# Patient Record
Sex: Female | Born: 1960
Health system: Southern US, Community
[De-identification: ages and names within clinical notes are randomized; demographics above are authoritative.]

## PROBLEM LIST (undated history)

## (undated) DIAGNOSIS — E669 Obesity, unspecified: Secondary | ICD-10-CM

## (undated) DIAGNOSIS — F319 Bipolar disorder, unspecified: Secondary | ICD-10-CM

## (undated) DIAGNOSIS — F259 Schizoaffective disorder, unspecified: Secondary | ICD-10-CM

## (undated) DIAGNOSIS — R569 Unspecified convulsions: Secondary | ICD-10-CM

## (undated) DIAGNOSIS — M1990 Unspecified osteoarthritis, unspecified site: Secondary | ICD-10-CM

## (undated) DIAGNOSIS — F1910 Other psychoactive substance abuse, uncomplicated: Secondary | ICD-10-CM

## (undated) DIAGNOSIS — F250 Schizoaffective disorder, bipolar type: Secondary | ICD-10-CM

## (undated) DIAGNOSIS — I10 Essential (primary) hypertension: Secondary | ICD-10-CM

## (undated) DIAGNOSIS — F99 Mental disorder, not otherwise specified: Secondary | ICD-10-CM

## (undated) HISTORY — PX: STOMACH SURGERY: SHX791

---

## 2004-05-17 ENCOUNTER — Emergency Department (HOSPITAL_COMMUNITY): Admission: EM | Admit: 2004-05-17 | Discharge: 2004-05-17 | Payer: Self-pay | Admitting: Emergency Medicine

## 2006-12-05 ENCOUNTER — Emergency Department (HOSPITAL_COMMUNITY): Admission: EM | Admit: 2006-12-05 | Discharge: 2006-12-06 | Payer: Self-pay | Admitting: Emergency Medicine

## 2007-01-25 ENCOUNTER — Emergency Department (HOSPITAL_COMMUNITY): Admission: EM | Admit: 2007-01-25 | Discharge: 2007-01-26 | Payer: Self-pay | Admitting: Emergency Medicine

## 2008-01-23 ENCOUNTER — Ambulatory Visit: Payer: Self-pay | Admitting: Internal Medicine

## 2008-01-23 ENCOUNTER — Encounter: Payer: Self-pay | Admitting: Emergency Medicine

## 2008-01-23 ENCOUNTER — Ambulatory Visit: Payer: Self-pay | Admitting: Obstetrics & Gynecology

## 2008-01-23 ENCOUNTER — Inpatient Hospital Stay (HOSPITAL_COMMUNITY): Admission: AD | Admit: 2008-01-23 | Discharge: 2008-02-11 | Payer: Self-pay | Admitting: Obstetrics and Gynecology

## 2008-01-29 ENCOUNTER — Ambulatory Visit: Payer: Self-pay | Admitting: Infectious Disease

## 2008-01-29 ENCOUNTER — Encounter: Payer: Self-pay | Admitting: Obstetrics & Gynecology

## 2008-04-02 ENCOUNTER — Inpatient Hospital Stay (HOSPITAL_COMMUNITY): Admission: AD | Admit: 2008-04-02 | Discharge: 2008-04-02 | Payer: Self-pay | Admitting: Obstetrics & Gynecology

## 2008-04-10 ENCOUNTER — Ambulatory Visit: Payer: Self-pay | Admitting: Family Medicine

## 2008-09-14 ENCOUNTER — Emergency Department (HOSPITAL_COMMUNITY): Admission: EM | Admit: 2008-09-14 | Discharge: 2008-09-14 | Payer: Self-pay | Admitting: Emergency Medicine

## 2009-10-12 IMAGING — CR DG CHEST 1V
1 series · 1 of 1 positions shown · non-contrast
Comparison: 01/27/2008

CLINICAL DATA: Abdominal pain, pneumonia

CHEST - 1 VIEW

[view not recorded]
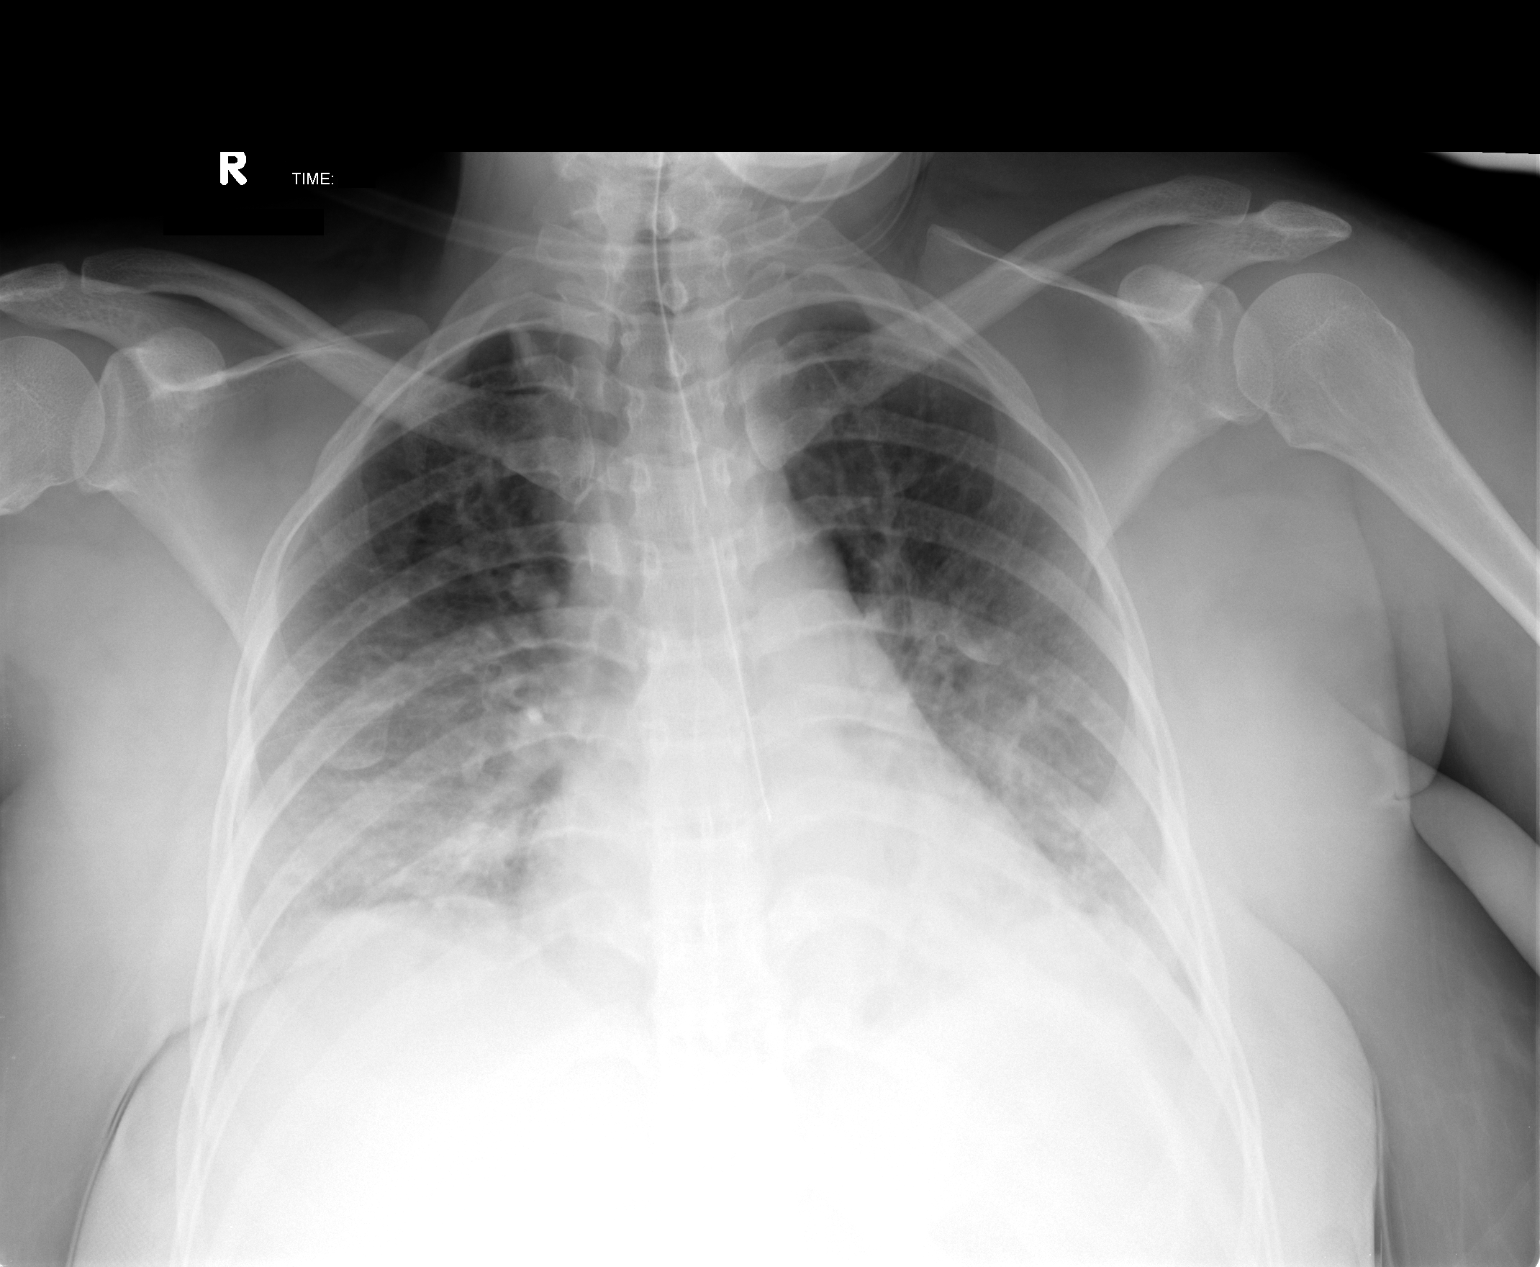

[1 of 1 positions shown; findings below may reference images not displayed]

FINDINGS: Nasogastric tube tip terminates over the distal one third
of the esophagus.  Heart size at the upper limits of normal.  Ill-
defined bibasilar pulmonary parenchymal opacities persist.
Aeration decreased since the prior study.  Small right-sided
pleural effusion noted.
IMPRESSION: Persistent bibasilar airspace opacities, which may represent a
combination of pneumonia or atelectasis. If the patient's symptoms
continue, consider PA and lateral chest radiographs obtained at
full inspiration when the patient is clinically able.

Nasogastric tube tip over distal one third of the esophagus, which
should be repositioned.

## 2009-10-12 IMAGING — CR DG ABDOMEN 2V
2 series · 2 of 2 positions shown · non-contrast
Comparison: January 26, 2008

CLINICAL DATA: Worsening right lower quadrant pain and abdominal
distention

ABDOMEN - 2 VIEW

[view not recorded (1 of 2)]
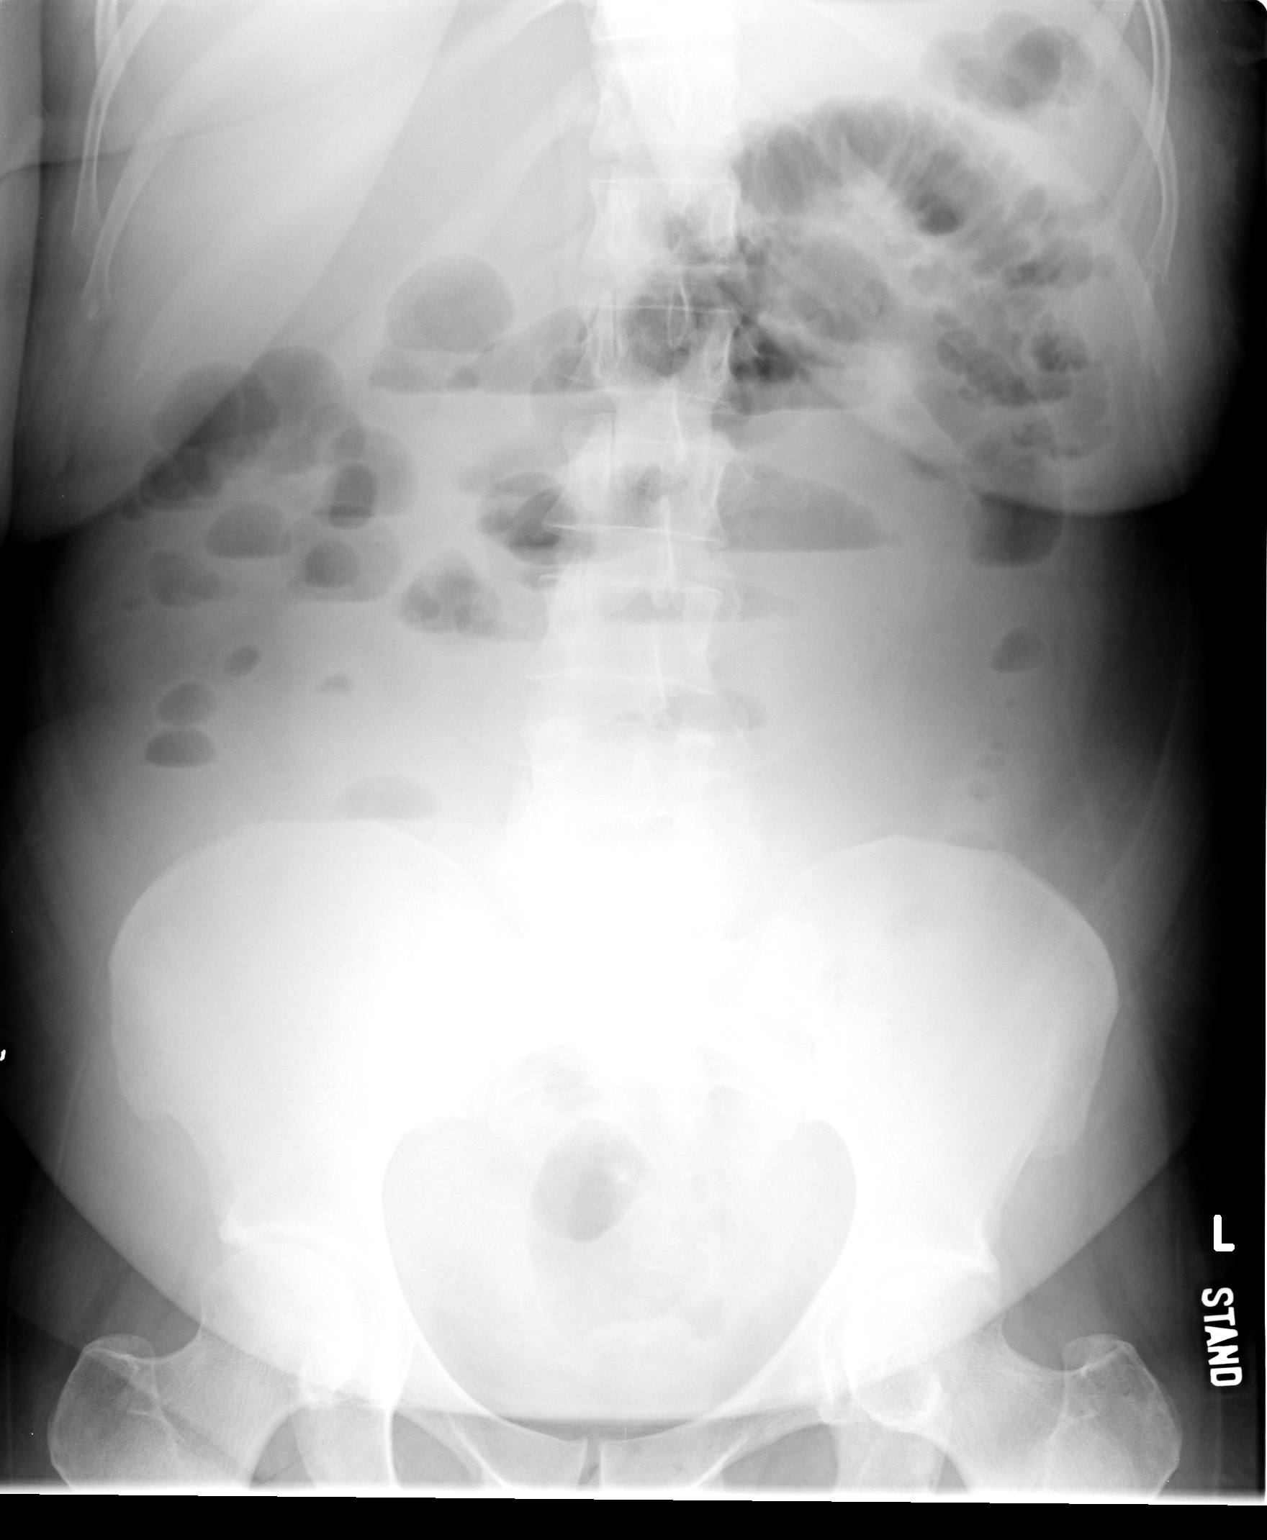

[view not recorded (2 of 2)]
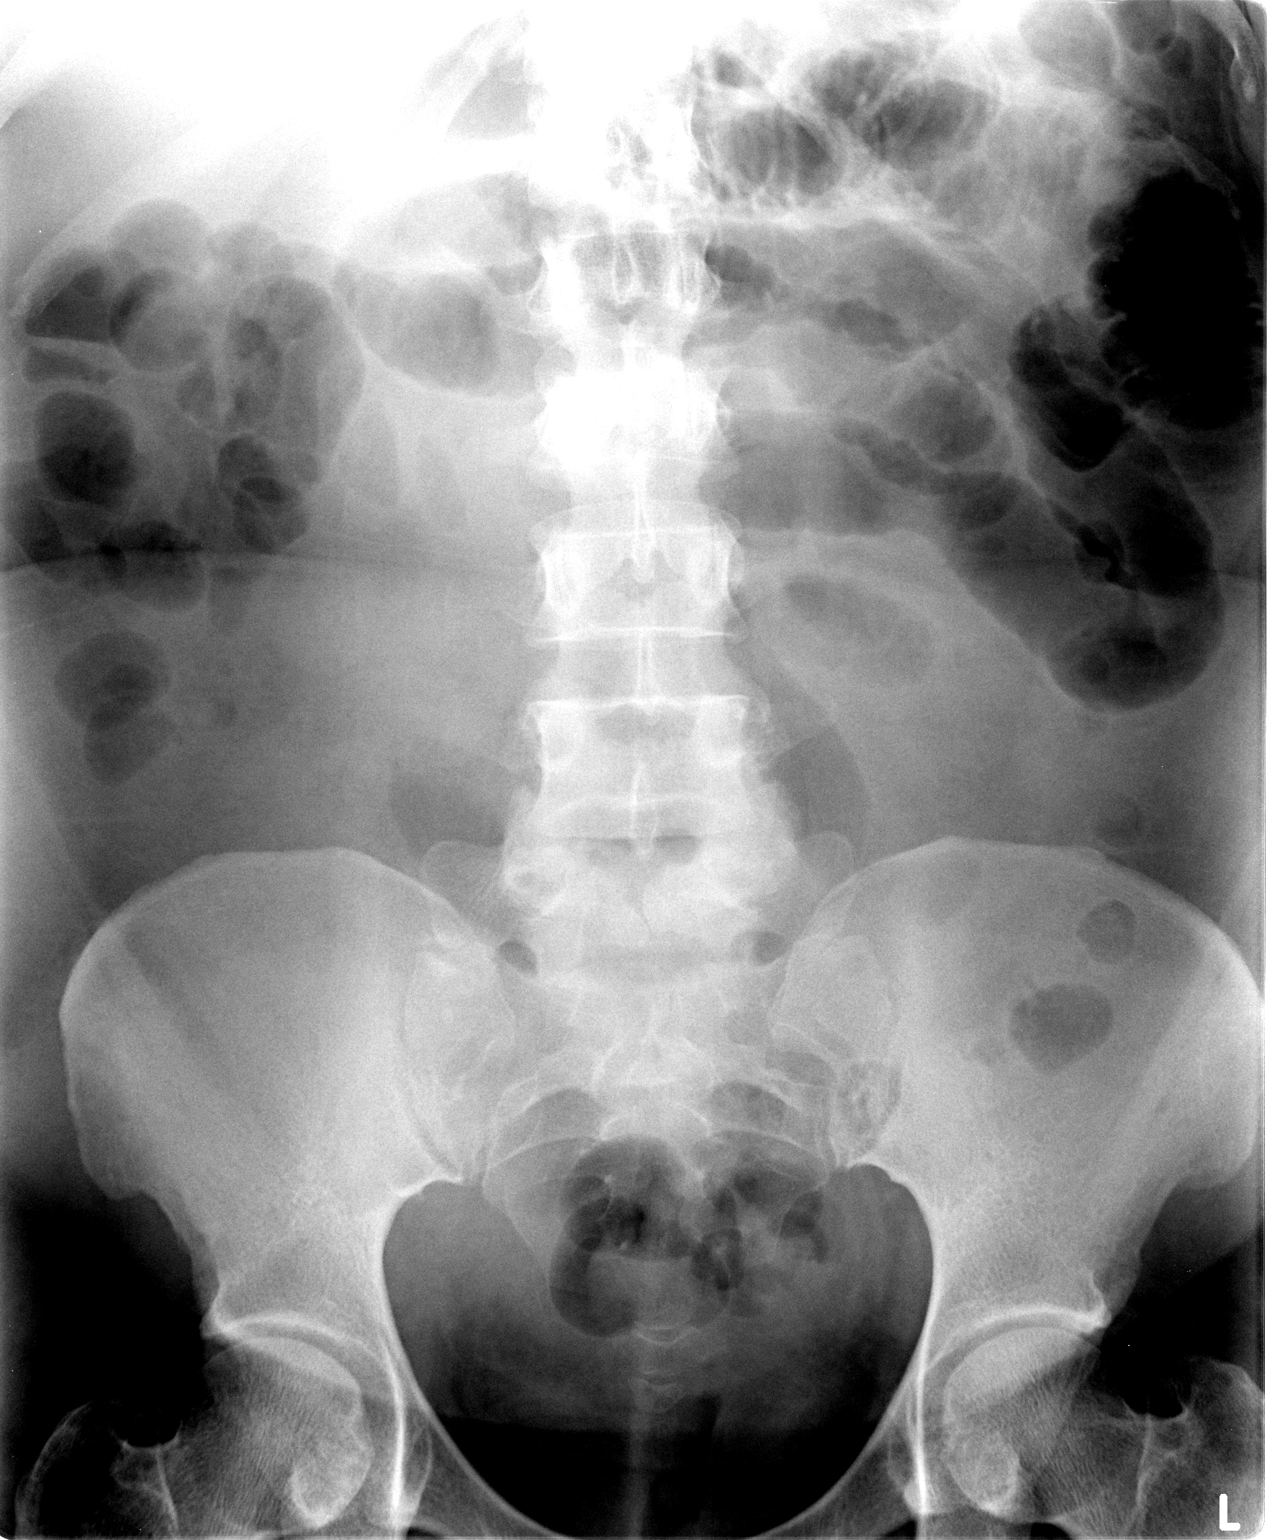

[2 of 2 positions shown; findings below may reference images not displayed]

FINDINGS: Small bowel distention and air-fluid levels are now
present, worrisome for ileus or low grade obstruction from the
inflammatory process seen on the January 25, 2008 CT scan.  No
pneumoperitoneum is identified.
IMPRESSION: Small bowel dilatation is now present, consistent with ileus or low
grade obstruction from right lower quadrant inflammatory process
seen on recent CT scan.  The case was discussed with the resident
physician.

## 2009-10-17 IMAGING — CR DG CHEST 2V
2 series · 2 of 2 positions shown · non-contrast
Comparison: 01/31/2008

CLINICAL DATA: Pneumonia, behavioral disorder, post exploratory
laparotomy

CHEST - 2 VIEW

[view not recorded (1 of 2)]
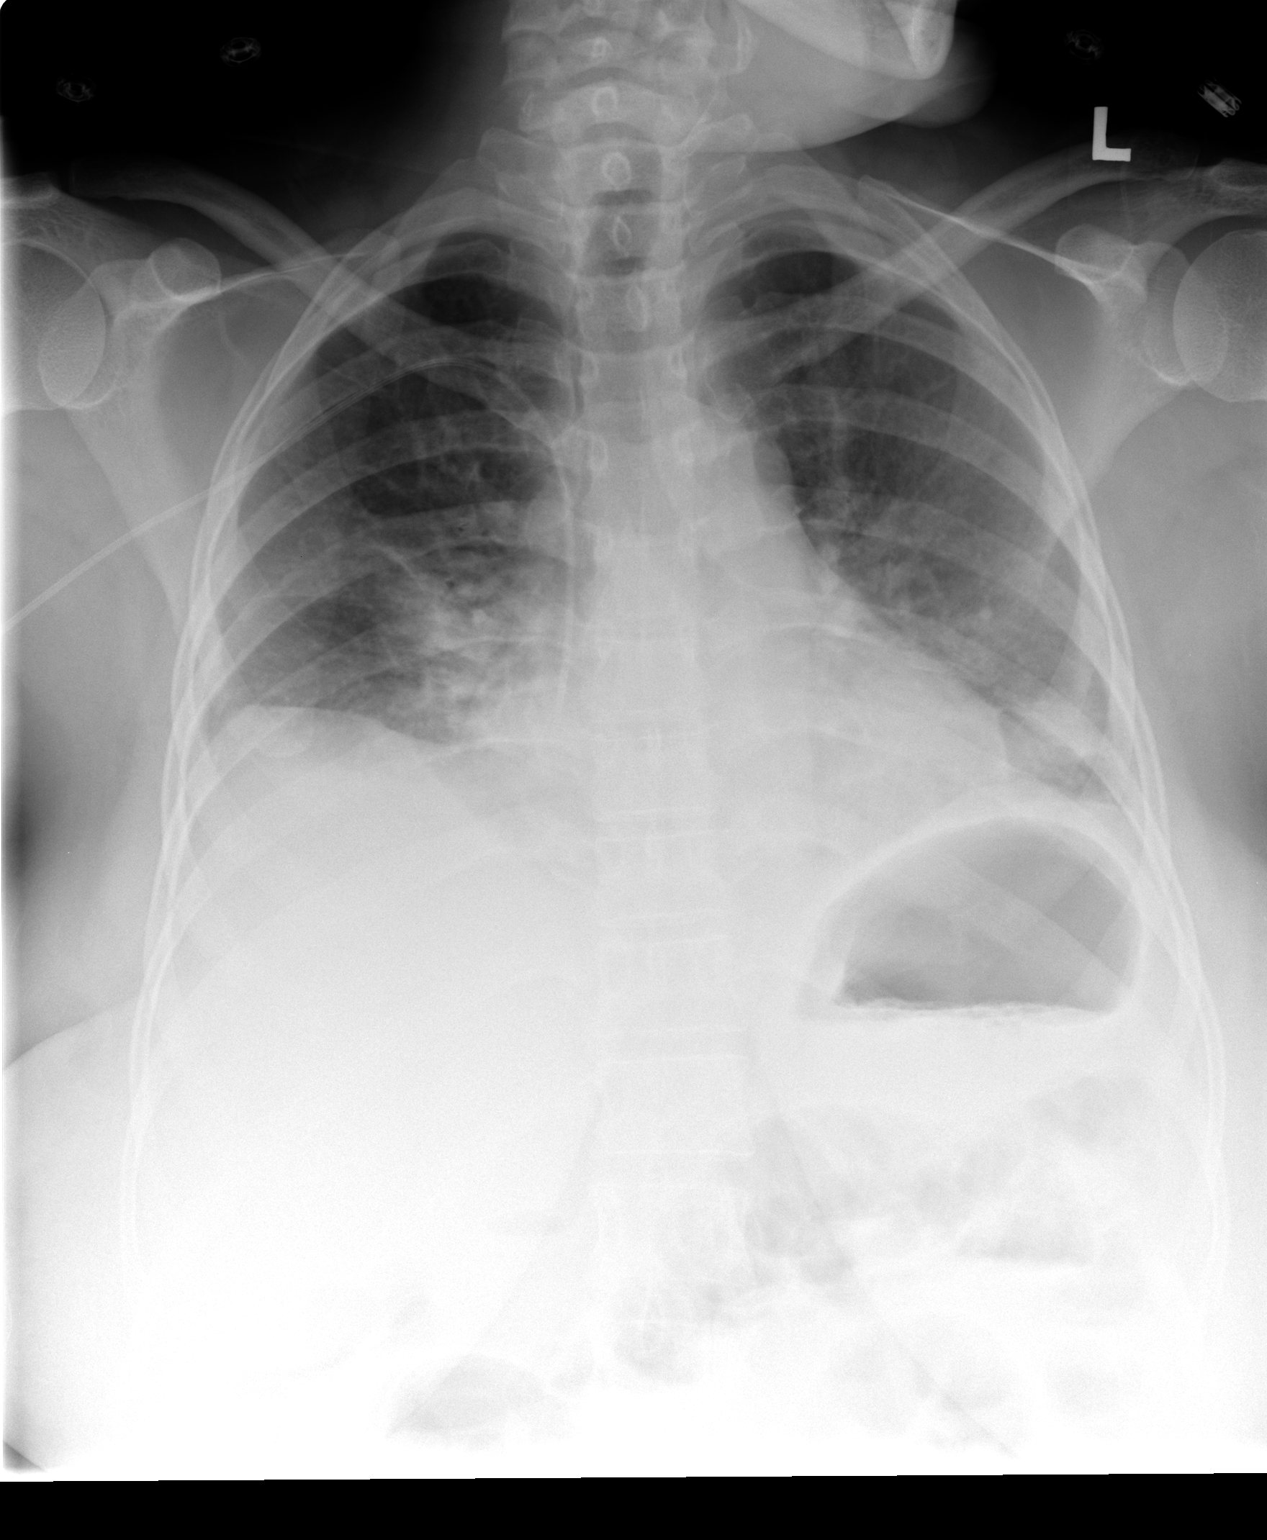

[view not recorded (2 of 2)]
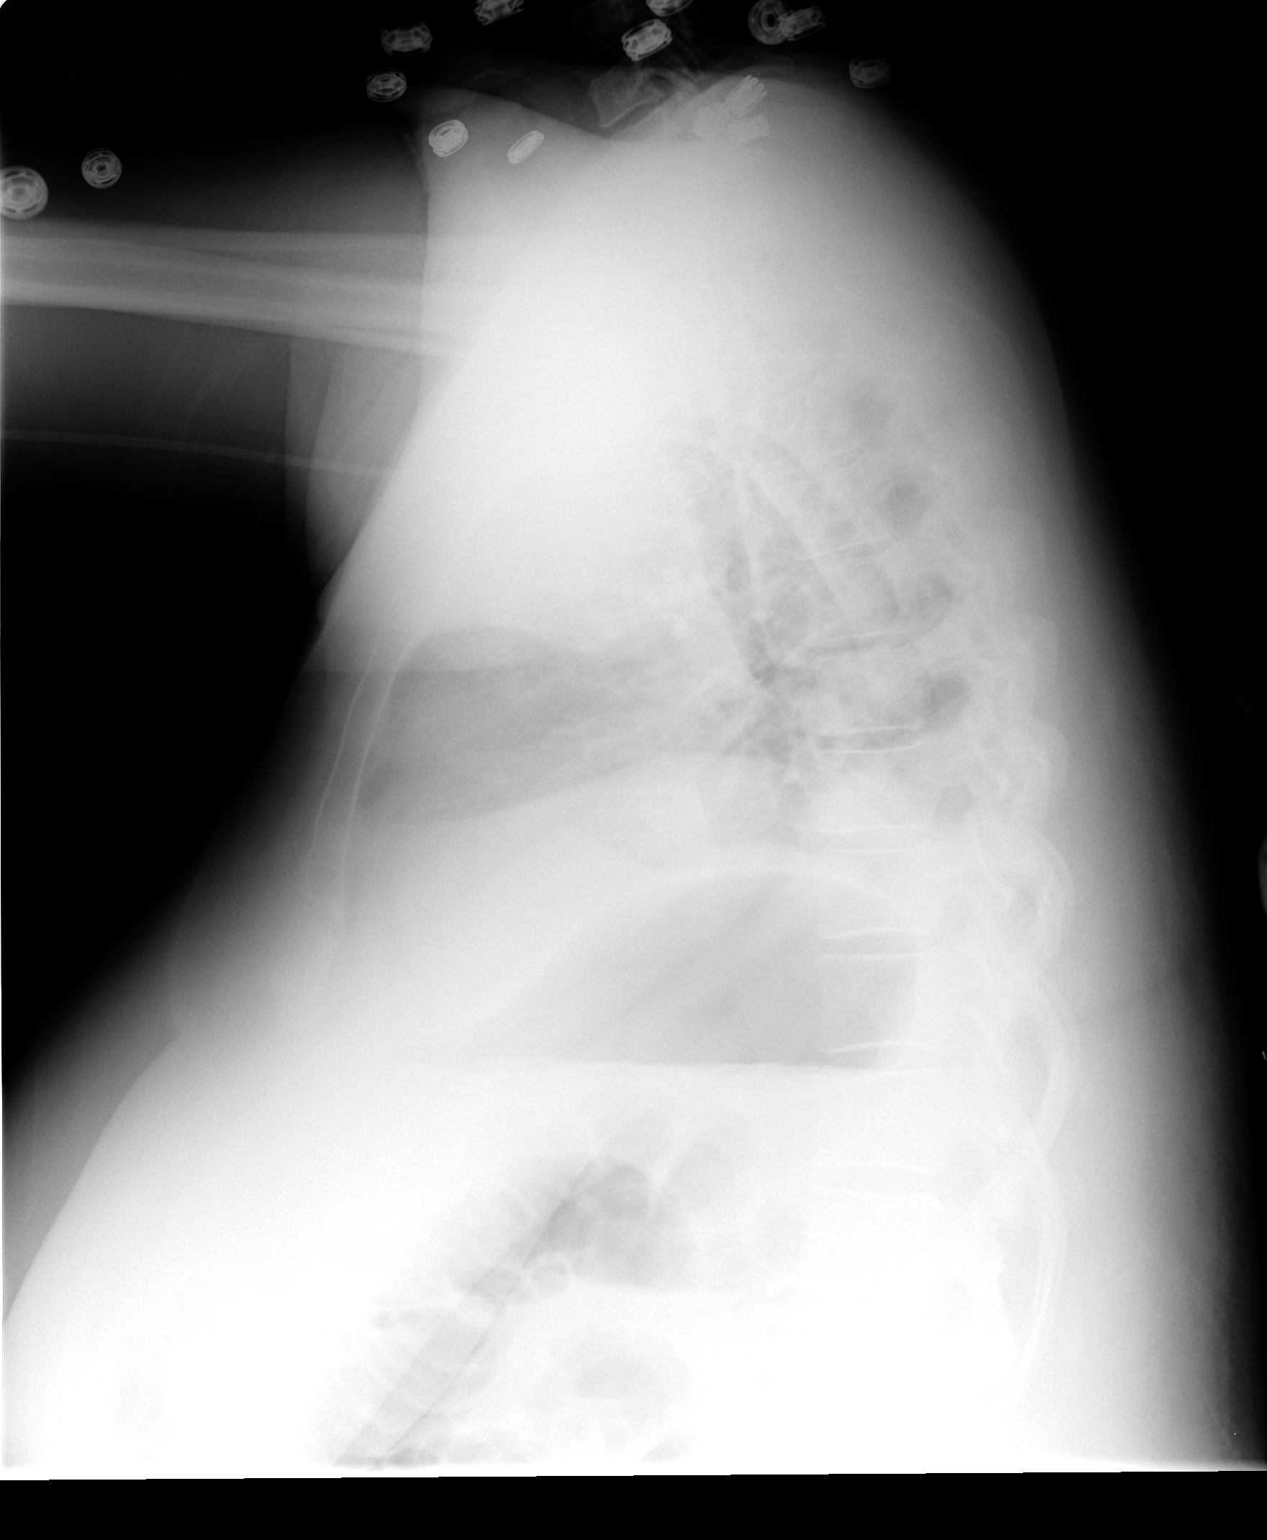

[2 of 2 positions shown; findings below may reference images not displayed]

FINDINGS: Accentuated heart size and bibasilar atelectasis due to low
respiratory lung volumes.
Cannot exclude early right perihilar infiltrate.
Tip of right arm PICC line at cavoatrial junction.
Upper lungs clear.
Crowding of perihilar markings.
Bones unremarkable.
IMPRESSION: Bibasilar atelectasis, cannot exclude early right perihilar
infiltrate.

## 2010-06-20 LAB — RAPID URINE DRUG SCREEN, HOSP PERFORMED
Amphetamines: NOT DETECTED
Cocaine: POSITIVE — AB
Opiates: NOT DETECTED
Tetrahydrocannabinol: NOT DETECTED

## 2010-06-20 LAB — CBC
HCT: 40.4 % (ref 36.0–46.0)
MCHC: 34 g/dL (ref 30.0–36.0)
MCV: 90.1 fL (ref 78.0–100.0)
Platelets: 257 10*3/uL (ref 150–400)
RDW: 14.1 % (ref 11.5–15.5)
WBC: 4.7 10*3/uL (ref 4.0–10.5)

## 2010-06-20 LAB — POCT CARDIAC MARKERS
Myoglobin, poc: 46.8 ng/mL (ref 12–200)
Myoglobin, poc: 71.8 ng/mL (ref 12–200)

## 2010-06-20 LAB — DIFFERENTIAL
Eosinophils Absolute: 0.1 10*3/uL (ref 0.0–0.7)
Eosinophils Relative: 3 % (ref 0–5)
Lymphs Abs: 1.5 10*3/uL (ref 0.7–4.0)

## 2010-06-20 LAB — POCT I-STAT, CHEM 8
Creatinine, Ser: 1 mg/dL (ref 0.4–1.2)
Hemoglobin: 14.6 g/dL (ref 12.0–15.0)
Sodium: 140 mEq/L (ref 135–145)
TCO2: 28 mmol/L (ref 0–100)

## 2010-06-21 ENCOUNTER — Encounter: Payer: Self-pay | Admitting: Internal Medicine

## 2010-06-21 ENCOUNTER — Emergency Department (HOSPITAL_COMMUNITY): Payer: Medicare Other

## 2010-06-21 ENCOUNTER — Observation Stay (HOSPITAL_COMMUNITY)
Admission: EM | Admit: 2010-06-21 | Discharge: 2010-06-23 | Disposition: A | Discharge status: Home / Self Care | Payer: Medicare Other | Attending: Infectious Diseases | Admitting: Infectious Diseases

## 2010-06-21 DIAGNOSIS — E669 Obesity, unspecified: Secondary | ICD-10-CM | POA: Insufficient documentation

## 2010-06-21 DIAGNOSIS — F1910 Other psychoactive substance abuse, uncomplicated: Secondary | ICD-10-CM | POA: Diagnosis present

## 2010-06-21 DIAGNOSIS — F209 Schizophrenia, unspecified: Secondary | ICD-10-CM | POA: Diagnosis present

## 2010-06-21 DIAGNOSIS — R079 Chest pain, unspecified: Principal | ICD-10-CM | POA: Insufficient documentation

## 2010-06-21 DIAGNOSIS — F319 Bipolar disorder, unspecified: Secondary | ICD-10-CM | POA: Insufficient documentation

## 2010-06-21 DIAGNOSIS — F17200 Nicotine dependence, unspecified, uncomplicated: Secondary | ICD-10-CM | POA: Insufficient documentation

## 2010-06-21 DIAGNOSIS — F1010 Alcohol abuse, uncomplicated: Secondary | ICD-10-CM | POA: Insufficient documentation

## 2010-06-21 DIAGNOSIS — F1410 Cocaine abuse, uncomplicated: Secondary | ICD-10-CM | POA: Insufficient documentation

## 2010-06-21 LAB — COMPREHENSIVE METABOLIC PANEL
AST: 24 U/L (ref 0–37)
Alkaline Phosphatase: 50 U/L (ref 39–117)
BUN: 13 mg/dL (ref 6–23)
CO2: 25 mEq/L (ref 19–32)
Chloride: 104 mEq/L (ref 96–112)
Creatinine, Ser: 0.8 mg/dL (ref 0.4–1.2)
GFR calc Af Amer: >60 mL/min (ref >60)
GFR calc non Af Amer: >60 mL/min (ref >60)
Total Bilirubin: 0.6 mg/dL (ref 0.3–1.2)

## 2010-06-21 LAB — CBC
MCH: 29.5 pg (ref 26.0–34.0)
MCHC: 33.6 g/dL (ref 30.0–36.0)
Platelets: 227 10*3/uL (ref 150–400)

## 2010-06-21 LAB — POCT CARDIAC MARKERS
CKMB, poc: <1 ng/mL — ABNORMAL LOW (ref 1.0–8.0)
CKMB, poc: <1 ng/mL — ABNORMAL LOW (ref 1.0–8.0)
Myoglobin, poc: 21 ng/mL (ref 12–200)
Myoglobin, poc: 26.9 ng/mL (ref 12–200)
Troponin i, poc: <0.05 ng/mL (ref 0.00–0.09)
Troponin i, poc: <0.05 ng/mL (ref 0.00–0.09)

## 2010-06-21 LAB — RAPID URINE DRUG SCREEN, HOSP PERFORMED
Amphetamines: NOT DETECTED
Barbiturates: NOT DETECTED
Benzodiazepines: NOT DETECTED
Cocaine: POSITIVE — AB
Opiates: NOT DETECTED

## 2010-06-21 LAB — ETHANOL: Alcohol, Ethyl (B): <5 mg/dL (ref 0–10)

## 2010-06-21 LAB — PROTIME-INR
INR: 0.97 (ref 0.00–1.49)
Prothrombin Time: 13.1 s (ref 11.6–15.2)

## 2010-06-21 LAB — URINALYSIS, ROUTINE W REFLEX MICROSCOPIC
Glucose, UA: NEGATIVE mg/dL
Hgb urine dipstick: NEGATIVE
Specific Gravity, Urine: 1.013 (ref 1.005–1.030)

## 2010-06-21 LAB — MAGNESIUM: Magnesium: 2.2 mg/dL (ref 1.5–2.5)

## 2010-06-21 LAB — TSH: TSH: 0.893 u[IU]/mL (ref 0.350–4.500)

## 2010-06-21 LAB — CK TOTAL AND CKMB (NOT AT ARMC)
CK, MB: 3.2 ng/mL (ref 0.3–4.0)
Relative Index: INVALID (ref 0.0–2.5)
Total CK: 94 U/L (ref 7–177)

## 2010-06-21 LAB — TROPONIN I: Troponin I: 0.05 ng/mL (ref 0.00–0.06)

## 2010-06-21 LAB — APTT: aPTT: 26 s (ref 24–37)

## 2010-06-21 NOTE — H&P (Signed)
Hospital Admission Note Date: 06/21/2010  Patient name: [name] Medical record number: [id number] Date of birth: November 29, 1960 Age: 50 y.o. Gender: female PCP: No primary provider on file.  Medical Service: Internal Medicine Teaching Service Team B-1  Attending physician:  Dr. Darlina Sicilian Resident [phone]): Dr. Bethel Born  Pager: [phone] Resident (R1): Dr. Bard Herbert   Pager: [phone] Medical Student (MSIII): Alanson Puls Pager: [phone]  Chief Complaint: Chest pain  History of Present Illness: Patient is a 50 year old woman with a PMH significant for schizophrenia, depression, cocaine, and alcohol abuse who presents to the [facility] ER with a history of chest pain since Saturday 06/19/10.  She states that over the last few weeks she have been using crack cocaine daily and drinking excessive amounts, up to 4-5 40 oz beer.  Saturday she was walking back to her boarding home from the bus stop and started to feel a sharp, stabbing 10/10 chest pain.  The pain was in the center of her chest and did not radiate.  She stopped to rest and it got better and then when she started walking again it started to get worse.  It was associated with shortness of breath but she denies diaphoresis, dizziness, syncope, or abdominal pain.  Since then she has had several repeat bouts of this pain and decided to come to the ER today when she continued to have the intermittent pain and also because she wants to quit cocaine and alcohol.  She is a smoker and has also used crack cocaine since she was 50 years old with several stays in different rehabilitation facilities in the past.  She has a history of schizophrenia and states that she has been taking her medications as directed.  She uses [facility] [place] to help manage her medications.  She denies SI, HI, racing thoughts, visual hallucinations, auditory hallucinations, or tactile hallucinations but does state that she hasn't slept much in the last few  weeks.  Medications: 1. Depakote 1000 mg BID PO 2. Seroquel 50 mg BID PO 3. Risperidone 4 mg PO daily  Allergies: NKDA  PMH:  1. Schizophrenia 2. Depression 3. Alcohol abuse 4. Cocaine abuse  Past Surg Hx: 1. Exploratory Laparotomy for tubal abscess 2. Appendectomy 3. Right salpingo-oophorectomy 4. Omentectomy  Family History:  Social History  . Marital Status: Divorced    Spouse Name: N/A    Number of Children: 2  . Years of Education: Not assessed   Occupational History  . None   Social History Main Topics  . Smoking status: Every day smoker for at least 20 years  . Smokeless tobacco: None  . Alcohol Use: 4-5 40 oz beers daily  . Drug Use: Crack cocaine and "pain pills"  . Sexually Active: Has been trading Sex for crack cocaine    Social History Narrative  . States she has been living in boarding houses for many years.    Review of Systems: Negative except for as noted in the HPI.  Vital Signs: Tm: 98.1  Pulse: 82  BP: 145/95  Resp: 14  Sats: 100% on RA Physical Exam: Constitutional: Vital signs reviewed.  Patient is an obese, well-developed female in no acute distress and cooperative with exam. Alert and oriented x3.  Head: Normocephalic and atraumatic Ear: TM normal bilaterally Mouth: no erythema or exudates, MMM, occasional abnormal movements of her mouth. Eyes: PERRL, EOMI, conjunctivae normal, No scleral icterus.  Neck: Supple, Trachea midline normal ROM, No JVD, mass, thyromegaly, or carotid bruit  present.  Cardiovascular: RRR, S1 normal, S2 normal, no MRG, pulses symmetric and intact bilaterally Pulmonary/Chest: CTAB, no wheezes, rales, or rhonchi, decreased air movement secondary to shallow respirations Abdominal: Soft. Non-tender, non-distended, bowel sounds are normal, no masses, organomegaly, or guarding present.  GU: no CVA tenderness Musculoskeletal: No joint deformities, erythema, or stiffness, ROM full and no nontender Hematology: no  cervical, inginal, or axillary adenopathy.  Neurological: A&O x3, Strength is normal and symmetric bilaterally, cranial nerve II-XII are grossly intact, no focal motor deficit, sensory intact to light touch bilaterally.  Skin: Warm, dry and intact. No rash, cyanosis, or clubbing.  Psychiatric: Normal mood and affect. speech is non-pressured and behavior is normal. Judgment and insight poor. Thought content normal. Cognition and memory are normal.   Lab results: CBC:    Component Value Date/Time   WBC 4.8 06/21/2010 1320   HGB 13.0 06/21/2010 1320   HCT 38.7 06/21/2010 1320   PLT 227 06/21/2010 1320   MCV 88.0 06/21/2010 1320   NEUTROABS 2.7 09/14/2008 1253   LYMPHSABS 1.5 09/14/2008 1253   MONOABS 0.3 09/14/2008 1253   EOSABS 0.1 09/14/2008 1253   BASOSABS 0.0 09/14/2008 1253   Comprehensive Metabolic Panel:    Component Value Date/Time   NA 137 06/21/2010 1320   K 4.9 HEMOLYZED SPECIMEN, RESULTS MAY BE AFFECTED 06/21/2010 1320   CL 104 06/21/2010 1320   CO2 25 06/21/2010 1320   BUN 13 06/21/2010 1320   CREATININE 0.80 06/21/2010 1320   GLUCOSE 100* 06/21/2010 1320   CALCIUM 8.7 06/21/2010 1320   AST 24 06/21/2010 1320   ALT 24 06/21/2010 1320   ALKPHOS 50 06/21/2010 1320   BILITOT 0.6 06/21/2010 1320   PROT 6.8 06/21/2010 1320   ALBUMIN 3.4* 06/21/2010 1320    Color, Urine                              YELLOW            YELLOW  Appearance                                CLEAR               CLEAR  Specific Gravity                          1.013                 1.005-1.030  pH                                         6.0                    5.0-8.0  Urine Glucose                             NEGATIVE          NEG              mg/dL  Bilirubin                                  NEGATIVE  NEG  Ketones                                   NEGATIVE          NEG              mg/dL  Blood                                      NEGATIVE          NEG  Protein                                    NEGATIVE          NEG               mg/dL  Urobilinogen                              0.2                    0.0-1.0          mg/dL  Nitrite                                    NEGATIVE          NEG  Leukocytes                                NEGATIVE          NEG  Amphetamins                               SEE NOTE.         NDT    NONE DETECTED  Barbiturates                              SEE NOTE.         NDT  Benzodiazepines                           SEE NOTE.         NDT    NONE DETECTED  Cocaine                                   POSITIVE   a       NDT  Opiates                                   SEE NOTE.         NDT    NONE DETECTED  Tetrahydrocannabinol                      SEE NOTE.         NDT    NONE  DETECTED   CKMB, POC                                 <1.0       l      1.0-8.0          ng/mL  Troponin I, POC                           <0.05             0.00-0.09        ng/mL  Myoglobin, POC                            21.0              12-200           ng/mL   Imaging results:  1. Chest X-ray   Findings: Upper normal heart size. Mediastinal contours and pulmonary vascularity normal. Peribronchial thickening without infiltrate or effusion. No pneumothorax. Osseous structures unremarkable.  Assessment & Plan by Problem: 1. Chest pain:  Differential diagnosis includes ACS, angina, cocaine induced vasospasm, musculoskeletal pain, pneumonia, and PE.  With a normal white count, no fever, and no infiltrate on her chest x-ray pneumonia is less likely.  Her revised Geneva score is 3 with the only points given because of pulse 75-95 so PE is less likely.  There is no tenderness to palpation in the chest so musculoskeletal pain is less likely.  EKG does show some T wave changes.  This is likely cocaine induced chest pain, either vasospasm or from the increased work secondary to the cocaine use.  Since she does have some T wave changes we will admit her for observation and chest pain rule out.  - Admit to Telemetry bed  - CE q8 x3  -  EKG in Am  - Risk stratify: HgBA1C, FLP, TSH  2. Polysubstance abuse with alcohol, cocaine, and tobacco:  The patient has voiced that she would like help quitting drugs and alcohol.  Since she will need to be medically stable before transfer to whatever program she chooses we will get things in line for her to go as soon as she is deemed medically stable.    - Consult ACT team for coordination of transfer to [facility] or chosen detox program when workup for chest pain is completed.  - CIWA protocol  - B1, Folate, MVI  - Nicotine patch 21 mg/24hr  3. Schizophrenia:  The patient has a long history of schizophrenia and is on several medications for this that she states she takes.  She brought her pill box to the ER so we identified the pills and will continue them in the hospital.  Currently she denies hallucinations, SI, HI, or other warning signs.   - Depakote 1000 mg BID PO  - Risperidone 4 mg PO daily  - Seroquel 50 mg BID PO  4. VTE prophylaxis: Heparin 5000 units subcut TID.       MSIII______________________________      R1________________________________       R2________________________________  ATTENDING: I performed and/or observed a history and physical examination of the patient.  I discussed the case with the residents as noted and reviewed the residents' notes.  I agree with the findings and plan--please refer to the attending physician note for more  details.  Signature________________________________  Printed Name_____________________________

## 2010-06-22 DIAGNOSIS — R079 Chest pain, unspecified: Secondary | ICD-10-CM

## 2010-06-22 DIAGNOSIS — F319 Bipolar disorder, unspecified: Secondary | ICD-10-CM

## 2010-06-22 LAB — CARDIAC PANEL(CRET KIN+CKTOT+MB+TROPI)
CK, MB: 1.2 ng/mL (ref 0.3–4.0)
Relative Index: INVALID (ref 0.0–2.5)
Total CK: 75 U/L (ref 7–177)
Troponin I: 0.01 ng/mL (ref 0.00–0.06)

## 2010-06-22 LAB — LIPID PANEL
LDL Cholesterol: 159 mg/dL — ABNORMAL HIGH (ref 0–99)
Total CHOL/HDL Ratio: 5.2 RATIO
VLDL: 23 mg/dL (ref 0–40)

## 2010-06-22 NOTE — Discharge Summary (Signed)
Physician Discharge Summary   Patient ID: [name] [id number] 49 y.o. 1960/07/21  Admit date: 06/21/2010  Discharge date and time: 06/23/10   Admitting Physician: Dr. Darlina Sicilian  Discharge Physician: Dr. Darlina Sicilian  Admission Diagnoses:  1. Chest pain 2. Schizophrenia 3. Cocaine abuse 4. Alcohol abuse 5. Tobacco abuse  Discharge Diagnoses:  1. Chest pain likely secondary to Cocaine abuse 2. Schizophrenia 3. Cocaine abuse 4. Alcohol abuse 5. Tobacco abuse  Admission Condition: fair  Discharged Condition: fair  Indication for Admission: Chest pain  Hospital Course:  1. Chest pain:  The patient was admitted for chest pain that started the Saturday before admission.  She had been using crack cocaine daily and drinking alcohol in excess for several weeks prior to admission.  EKG on admission did show some non-specific T wave changes so she was admitted for ACS rule out.  Overnight after admission she refused blood tests and medications.  The morning after admission it was stressed that we needed to follow these to ensure she was not having a heart attack so she let us drawn one set of cardiac enzymes which were negative.  EKG was unchanged from the previous day so she was ruled out.  Cause of her chest pain was likely from heavy cocaine use.  2. Schizophrenia:  The patient states that she was taking her medications prior to hospitalization and brought her pill box with her.  It did appear that she had been taking her medications with only a few missed pills over the last few days.  She was maintained on her home medications and denied any vocal or visual hallucinations during her stay.    3. Cocaine abuse and alcohol abuse: UDS on admission was positive for cocaine which the patient admitted to.  She was counseled that she needed to not use cocaine and that it was the cause of her chest pain.  She states that the house she is living in is the cause of her use because everyone around  her is using.  She would like to move but has no other place to go.  CSW was consulted and they will work with her case manager to work to find her better housing.  Consults: CSW  Significant Diagnostic Studies:  1. Chest x-ray indings:  Upper normal heart size. Mediastinal contours and pulmonary vascularity normal.  Peribronchial thickening without infiltrate or effusion.  No pneumothorax. Osseous structures unremarkable.  Disposition:  [name] was discharged from [facility] in stable and improved condition.  She will have a follow up appointment with her PCP in 1-2 weeks to follow up from the hospitalization.  She should also meet with her case manager to help her find better housing.  Signed: Blaise Grieshaber 06/22/2010 2:37 PM

## 2010-06-23 NOTE — Consult Note (Signed)
NAME:  [name], [name]                ACCOUNT NO.:  [id number]  MEDICAL RECORD NO.:  [id number]           PATIENT TYPE:  O  LOCATION:  3711                         FACILITY:  [facility]  PHYSICIAN:  Anselm Jungling, MD  DATE OF BIRTH:  [birth year]  DATE OF CONSULTATION:  06/22/2010 DATE OF DISCHARGE:                                CONSULTATION   IDENTIFYING DATA AND REASON FOR REFERRAL:  The patient is a 50 year old unmarried African American female currently under the care of Dr. Maurice March and Dr. Scot Dock here at [facility]  Psychiatric consultation is requested to assess mental status, psychiatric history, and current medications, and overall treatment needs with respect to her substance abuse.  HISTORY OF THE PRESENTING PROBLEMS:  [name] was admitted on June 21, 2010.  She reported chest pain that started 2 days prior.  She has a long history of mental illness and polysubstance dependence.  She admitted to daily crack cocaine use and alcohol use, and indeed her urine drug screen was positive for cocaine.  She is presently on an alcohol withdrawal protocol based upon an Ativan taper.  Meanwhile, her chest pain is being evaluated medically, cardiac enzymes pending.  In addition, HIV antibodies are pending.  It is noted that in her laboratory studies, there are no significant abnormalities of liver function tests at this point.  This may be consistent with the patient's report that in the last week, she has only at two 40 ounce beers, but did use cocaine on a daily basis.  The patient reports a history of any prior episodes of treatment for chemical dependency at rehab.  Most recently, last week, she was involved in a course of detox treatment at [facility]. She apparently relapsed immediately when she got out.  She has never been hospitalized in the [facility] or Substance Abuse Treatment Program.  She is currently followed by an assertive  community treatment team known as [facility].  The psychiatrist there is Dr. Clyde Canterbury.  MEDICATIONS:  The patient's current medication regimen, which she states she has been adherent to, consists of, 1. Depakote 1000 mg b.i.d. 2. Seroquel 50 mg b.i.d. 3. Risperdal 4 mg at bedtime. 4. In addition, she states that she normally receives risperidone     constant injection every 2 weeks.  She is not sure of the dosage,     but states that she is due for a dose this coming Friday the 13th.  FAMILY HISTORY:  She is divorced, and apparently has two grown children. She has been living in a boarding house that she states that she does not like.  She states it is located in the neighborhood full of drugs and alcohol abuse.  She has admitted that she has been trading sex for cocaine recently.  Hence the HIV testing.  The patient has no other significant medical history that I can ascertain from the chart.  She is currently receiving aspirin 81 mg daily.  EKG shows a QTc of 397, within normal limits.  MENTAL STATUS AND OBSERVATIONS:  The patient is an obese, normally- developed  adult female who was awake, alert, and fully oriented.  She was pleasant and fairly friendly, although her affect is moderately blunted and mildly depressed.  She readily acknowledges her alcohol and an cocaine abuse, and indicates immediately that she does believe it is a problem for her and that she needs help with it.  She also talks quite a bit about wanting to find a different living situation other her current boarding house.  She would like to have her own place.  Thoughts and speech are normally organized.  She makes no delusional statements.  There is nothing to suggest any active psychosis or thought disorder.  She is not responding to internal stimuli.  There is no suicidal ideation.  She is generally calm, cooperative, and indicates that she is in no current distress.  IMPRESSION:  Axis I:  Cocaine  dependence, alcohol abuse, and history of schizoaffective disorder not otherwise specified. Axis II:  Deferred. Axis III:  See medical notes. Axis IV:  Stressors moderate. Axis: V:  Global Assessment of Functioning 40.  RECOMMENDATIONS:  At the present, I agree with her current management. Continue the Ativan protocol as appropriate.  However, she may not really require a full Ativan course, as she may not have been drinking enough prior to admission to require it.  Also, she was apparently in a protected detox environment until a couple of days ago, and so overall, in the last 10 days or so, her alcohol intake has probably been quite limited.  There is no specific therapy for cocaine withdrawal, and we might expect to see some lethargy and some affective depression.  At present, her psychotropic medications for presumed schizoaffective disorder appear to be appropriate and she does not appear to be symptomatic with respect to that disorder.  It would be helpful for social work services to be in touch with the [facility] program to appraise them of the patient's presence here, progress, and to coordinate aftercare and discharge planning.  I will continue to follow her during her stay here.  Please do not hesitate to contact if any further input can be provided.     Anselm Jungling, MD     SPB/MEDQ  D:  06/22/2010  T:  06/22/2010  Job:  [id number]  Electronically Signed by Geralyn Flash MD on 06/23/2010 10:49:17 AM

## 2010-07-13 NOTE — Discharge Summary (Signed)
NAMECHERIL, [name]                ACCOUNT NO.:  [id number]  MEDICAL RECORD NO.:  [id number]           PATIENT TYPE:  O  LOCATION:  3711                         FACILITY:  [facility]  PHYSICIAN:  Fransisco Hertz, M.D.  DATE OF BIRTH:  [birth year]  DATE OF ADMISSION:  06/21/2010 DATE OF DISCHARGE:  06/23/2010                              DISCHARGE SUMMARY   DISCHARGE DIAGNOSES: 1. Chest pain secondary to cocaine abuse. 2. Schizophrenia. 3. Cocaine abuse. 4. Alcohol abuse. 5. Tobacco abuse.  DISCHARGE MEDICATIONS:  Include: 1. Aspirin 81 mg by mouth daily. 2. Multivitamins with minerals 1 capsule by mouth daily. 3. Nicotine 14/24-hour patch, one patch applied daily. 4. Depakote 500 mg tablets, take 2 tablets by mouth twice daily. 5. Risperidone 4 mg take 1 tablet daily by mouth. 6. Seroquel 50 mg take 1 tablet twice daily.  DISPOSITION AND FOLLOWUP:  Ms. [name] was discharged from [facility] in stable and improved condition.  She will have a follow-up appointment with her PCP in 1 to 2 weeks to follow up from hospitalization.  She should have also been with her case manager to work on trying to find her better housing which she is trying to avoid people around who are using drugs and alcohol.  CONSULTATIONS:  Clinical social work.  PROCEDURE PERFORMED:  A chest x-ray, which showed a heart with the upper size of normal, mediastinal contours, and pulmonary vascularity with normal peribronchial thickening without infiltrate or effusion.  No pneumothorax.  Osseous structures were unremarkable.  ADMISSION HISTORY OF PRESENT ILLNESS:  [name] is a 50 year old woman with a past medical history significant for schizophrenia, depression, cocaine and alcohol abuse, who [facility] with history of chest pain since Saturday, June 19, 2010.  She states that over the last 2 weeks, she has been using crack cocaine daily and taking excessive amount up to  four to five 40-ounce beers per day.  Saturday, she was walking back for her boarding home from bus stop and started to feel a sharp stabbing 10/10 chest pain.  Pain was in the center of her chest and did not radiate.  She stopped to rest and it got better and which she started walking, again it started to get worse.  It was associated with shortness of breath, but she denies any diaphoresis, dizziness, syncope, or abdominal pain.  Since then, she has had several repeat bouts of this pain and decided to come to the ER today when she continued to have intermittent pain and also because she wants to quit cocaine and alcohol.  She is a smoker that she has been using crack cocaine since she was 50 years old with several stays in different rehabilitation facilities in the past.  She had a history of schizophrenia and states that she has been taking her medications as directed.  She used to be in [facility] in [place] to help manage her medications.  She denies any suicidal or homicidal ideation, racing thoughts, visual hallucination, auditory hallucinations, or tactile hallucinations, but does state that she has slept much in the last  few weeks.  ADMISSION PHYSICAL EXAM:  VITAL SIGNS:  Temperature is 98.1, pulse is 82, blood pressure is 145/95, respirations 14, saturating 100% on room air. GENERAL:  Tis is an obese, well-developed female, in no acute distress, cooperative to examination, alert and oriented x3. HEENT:  Head is normocephalic, atraumatic.  Tympanic membranes were normal bilaterally.  Mouth showed no exudates or erythema.  Moist mucous membranes.  Occasional abnormal movements of her mouth.  Pupils were equal, round, reactive to light.  Conjunctiva were normal.  No scleral icterus. NECK:  Supple.  Trachea midline.  No JVD, mass, or thyromegaly.  No carotid bruits are present. CARDIOVASCULAR:  Regular rate and rhythm.  Normal S1-S2.  No murmurs, rubs, or gallops.  Pulses  were symmetric and intact bilaterally. PULMONARY:  Chest was clear to auscultation bilaterally.  No wheezes, rales, or rhonchi.  There was decreased air movement secondary to shallow respirations. ABDOMEN:  Soft, nontender, nondistended.  Bowel sounds were normal.  No masses, organomegaly, or guarding were present. GU:  No CVA tenderness. MUSCULOSKELETAL:  No joint deformities, erythema, or stiffness.  Full range of motion with nontender joints. HEMATOLOGIC:  No cervical, inguinal, or axillary lymphadenopathy. NEUROLOGIC:  He is alert and oriented x3.  Strength was normal and symmetric bilaterally.  Cranial strength II-XII were grossly intact.  No focal motor deficits.  Sensory was intact to light touch bilaterally. SKIN:  Warm, dry, and intact.  No rashes, cyanosis, or clubbing. PSYCHIATRIC:  Normal mood and affect.  Speech was nonpressured and behavior was normal.  Judgment and insight were poor.  Thought content was normal.  Cognition and memory seemed intact.  ADMISSION LABORATORIES:  His white count was 4.9, hemoglobin was 13.0, hematocrit was 38.7, platelets were 227, and MCV was 88.  Absolute neutrophil count was 2.7.  Sodium was 137, potassium is 4.9, chloride was 104, bicarb was 25, BUN was 13, creatinine was 0.80, glucose was 100, calcium was 8.7, AST was 24, ALT 24, alk phos 50, bilirubin was 0.6, protein was 6.8, and albumin was 3.4.  Urinalysis was completely negative.  UDS was positive for cocaine and first set of cardiac markers point-of-care showed troponin less than 0.05.  HOSPITAL COURSE BY PROBLEMS: 1. Chest pain.  The patient was admitted for chest pain that started     the Saturday before admission.  She had been using crack cocaine     daily and drinking alcohol in excess for the several weeks prior to     admission.  EKG on admission did show some nonspecific T-wave     changes, so she admitted for an ACS to rule out overnight after     admission.  She refused  blood tests and medications.  The morning     after admission, it was stressed that we need to follow to ensure     she did not have a heart attack so she let us draw one set of cardiac     enzymes which were negative.  EKG was unchanged from previous day,     so she was ruled out for ACS.  Cause of her chest pain was likely     from heavy cocaine use.  She was risk stratified and she did     have a LDL that was borderline controlled in this woman that has no     other cardiac risk factors, it was decided that we should work with     her on trying to control her  diet a little bit better and also     maintaining cessation from alcohol, tobacco, and cocaine, and then     recheck her as an outpatient. 2. Schizophrenia.  The patient states that she had been taking her     medications prior to hospitalization and brought her pill box with     her.  It did appear that she had been taking her medications, but     only a few missed pills over the last few days.  She was maintained     on her home medications.  Denied any focal or visual hallucinations     during her stay.  She does have some minor atypical movements of     her mouth that are not concerning to her in fact she did not even     noticed some point of the mouth.  She should be monitored because     Risperdal that she is on does have an increased risk of having     abnormal movements associated with it. 3. Cocaine and alcohol abuse.  UDS on admission was positive for     cocaine, which the patient admitted to.  She was counseled that she     need not to use cocaine and there was a cause for chest pain.  She     states the house that she has been living in was the cause for her     use because everyone around her was using.  She would like to move,     but there is no other place to go.  Clinical social work was     consulted and we will work with case Production designer, theatre/television/film to find her better     housing.  DISCHARGE VITAL SIGNS:  Temperature was  98.7, blood pressure was 138/92, pulse was 90s, respirations 20, saturating 93% on room air.  DISCHARGE LABORATORIES:  HIV was nonreactive.    ______________________________ Leodis Sias, MD   ______________________________ Fransisco Hertz, M.D.    CP/MEDQ  D:  07/11/2010  T:  07/12/2010  Job:  [id number]  cc:   [facility]ally[facility] Case management Services  Electronically Signed by Leodis Sias MD on 07/12/2010 10:17:37 AM Electronically Signed by Lina Sayre M.D. on 07/13/2010 06:55:46 PM

## 2010-07-27 NOTE — Discharge Summary (Signed)
[name], Mary                ACCOUNT NO.:  [id number]   MEDICAL RECORD NO.:  [id number]          PATIENT TYPE:  INP   LOCATION:  9320                          FACILITY:  [facility]   PHYSICIAN:  Norton Blizzard, MD    DATE OF BIRTH:  [birth year]   DATE OF ADMISSION:  01/23/2008  DATE OF DISCHARGE:  02/11/2008                               DISCHARGE SUMMARY   REASON FOR ADMISSION:  Ms. [name] is a 50 year old female who was  admitted with right lower quadrant abdominal pain that had been  worsening over the previous 2 weeks.  She initially presented to [facility] where her evaluation revealed a probable tubo-ovarian abscess.  She  was transferred to the [facility] where she was admitted for IV  antibiotics and continued observation.   HOSPITAL COURSE:  As stated above, the patient was admitted and started  on triple antibiotic therapy with ampicillin, gentamicin, and  clindamycin.  The patient was observed over several days as her  abdominal pain clinically seemed to improve.  However, she had an  increased temperature, chest x-ray was ordered and revealed bilateral  lower lobe pneumonias.  For a period of time the thought was that her  spiking temperature (maximum temperature was as high as 103) was related  to pneumonia, and her right lower quadrant process seemed to be  improving.  During her hospitalization, the patient admitted to a long  history of crack cocaine use.  On January 29, 2008, which is hospital  day #7 the patient's right lower quadrant abdominal pain began to worsen  significantly.  Her exam revealed a more distended abdomen with right  lower quadrant tenderness and rebound.  WBC that a.m. had increased to  14.  Her plain films of the abdomen at this time revealed an early  ileus.  General Surgery was contacted and a CAT scan was repeated, which  was nondiagnostic; however, did reveal worsening of the fluid collection  in the right lower quadrant.  This  case was discussed at length with  General Surgery who felt that the tubo-ovarian abscess was the source of  the pain and infection.  Due to the patient's mental status and long  history of schizophrenia, a psychiatric consult was obtained.  Please  see dictated psych consult for further details of that.  However, the  psychiatrist did note that the patient was incapable of making informed  consent, so the patient's brother was contacted and informed consent was  obtained for surgery.  Please see Dr. Forestine Chute dictation of the  exploratory laparotomy.  The patient underwent exploratory laparotomy  and was found to have a right tubo-ovarian abscess.  The pelvis was  drained of pus.  The patient had a right salpingectomy, oophorectomy, as  well as omentectomy.  The left ovary and tube as well as the uterus were  clearly not involved in the infectious process.  There were no immediate  complications of surgery and after the surgery the patient was admitted  to the intensive care unit for further monitoring.  The patient's  postoperative course  was complicated by persistently spiking fevers.  Infectious Disease was consulted for further recommendations.  Her IV  antibiotics were continued using Primaxin and doxycycline.  Over the  course of the rest of her hospitalization, her fever curve trended down  and clinically her abdomen became less distended, more soft, and less  tender.  The patient began tolerating clear liquids and her diet was  advanced.  Except for somewhat of a decreased appetite, the patient is  tolerating solids without problem.  The patient began to ambulate more  and continued to clinically improve.  Towards the end of her  hospitalization, attempts were made to find placement for the patient as  psych did not feel she was capable of living on her own given the  current situation in terms of need for medical followup, continued  antibiotics, as well as her crack cocaine use.   However, at the time of  discharge the patient's son was unwilling to sign her in to an  outpatient group home or rehab program.  This is interesting as well as  concerning given the fact that the son did not visit her at all during  her prolonged monthly hospitalization.  Also of note that son was  unwilling to come and pick her up and was just verbally giving hospital  consent to discharge her.  The patient will be discharged to the case  manager for admission in a long-term care facility.  It was recommended  that the patient have daily followup with [facility] instead of previous 3  times a week followup that she had before her admission.  Of note,  [facility] is the patient's outpatient case manager mental health program  located in [place].  At the time of discharge, the patient's  abdominal pain was markedly decreased.  She was ambulating without  difficulty.  She was voiding and having bowel movements.  She was  tolerating a regular diet and she has regularly voiced her eagerness to  leave the hospital.   FINAL DIAGNOSES:  1. Right tubo-ovarian abscess.  2. Status post exploratory laparotomy.  3. Schizophrenia.  4. Crack cocaine abuse.   PROCEDURES DURING HOSPITALIZATION:  1. Exploratory laparotomy with omentectomy.  2. Appendectomy.   FINDINGS:  1. Cultures of the purulent material in her abdomen were negative.  2. Numerous negative blood cultures.  She had a benign appendix with      acute serositis.  The omentum showed acute and chronic inflammation      with necrosis and fibrosis and the right ovary and fallopian tube      showed acute salpingitis with necrosis, chronic salpingitis and      fibrosis, and tubo-ovarian fibrosis adhesions.   CONDITION AT DISCHARGE:  Stable.   The patient will be discharged to the care of a case manager from  [facility] per the son's request and verbal authorization.   INSTRUCTIONS AT DISCHARGE:  The patient was given instructions on  daily  wound care.  The patient will be discharged on a 1-week course of  ciprofloxacin 500 mg twice a day as well as Flagyl 500 mg twice a day.  Additionally, the patient will continue her outpatient psych meds of  Seroquel XR 150 mg every night, Depakote 2000 mg every night, and  Cogentin 0.5 mg every night.  The patient was instructed in the use of  these meds and instructions were given to the case manager as well;  however, there are certainly concerns about the patient's ability  to be  compliant with these meds.  The patient was offered followup  appointment; however, she states she will get her care in [place] and  instructions were given to her to make a followup appointment within 1  or 2 weeks, it can be done in our clinic if the patient desires and  contact numbers were also given.  The patient gave a verbal  understanding of the described instructions; however, they are certainly  concerned about her ability to completely understand the instructions.  Instructions were also given to the [facility] case manager.      Odie Sera, DO  Electronically Signed     ______________________________  Norton Blizzard, MD    MC/MEDQ  D:  02/11/2008  T:  02/12/2008  Job:  [id number]

## 2010-07-27 NOTE — Consult Note (Signed)
[name], WRUBEL                ACCOUNT NO.:  [id number]   MEDICAL RECORD NO.:  [id number]          PATIENT TYPE:  INP   LOCATION:  9320                          FACILITY:  [facility]   PHYSICIAN:  Antonietta Breach, M.D.  DATE OF BIRTH:  Mar 21, 1960   DATE OF CONSULTATION:  02/07/2008  DATE OF DISCHARGE:                                 CONSULTATION   Ms. Sole did have waxing and waning paranoia yesterday.  At one point,  she told the tech that she did not want to have the tech provide her any  form of assistance.  She clearly was paranoid and then later in the day  she did cooperate with care and was receptive.  Today so far, she has  not exhibited any further paranoia.  She has not had any hallucinations.  She does have normal interests in music.  She is not combative.  She is  redirectable.   She continues with her baseline impairment and rational conceptions, as  well as odd ways of doing processes (part of her negative symptoms).  She also continues with her baseline impaired judgment.   REVIEW OF SYSTEMS:  No stiffness or other extrapyramidal side effects  from Seroquel.   LABORATORY DATA:  Sodium 136, BUN 3, creatinine is 0.9, SGOT is 18, SGPT  14, WBC 9.6, platelet 597.   PHYSICAL EXAMINATION:  VITAL SIGNS:  Temperature 99.2, pulse 112,  respiratory rate 20, blood pressure 137/92.  O2 saturation on room air  97%.   MENTAL STATUS EXAM:  Please see the above.  Thought process does involve  alogia and odd conception.  She has occasional inappropriate laughter.  There is no indication that she is having any hallucinations or  delusions.  Her judgment is at its baseline level along with her  insight.  She is oriented to all spheres.  Her memory is intact to  immediate, recent and remote.  Judgment and insight are impaired.   ASSESSMENT:  AXIS I:  1. 293.82, psychotic disorder, not otherwise specified, stable with a      mild exacerbation yesterday.  2. 293.83, mood disorder,  not otherwise specified, stable.  3. Rule out 295.70, schizoaffective disorder.  The patient may also      have a form of schizophrenia.   DISCUSSION:  Ms. Mangine does not have the ability to appreciate her need  for supervised institutional care involving day involving certain  maintenance healthcare.  Therefore, she does not have the capacity for  informed consent regarding choosing her discharge environment.   Regarding the patients mild exacerbation of paranoia yesterday, if this  returns raising Seroquel will be necessary.   Therefore, would check an EKG for a QTC.  If the patient has recurrent  paranoia or other psychosis such as hallucinations, would ensure that  the QTC is less than 450 milliseconds and then would raise the Seroquel  200 mg nightly.   Would not change her other psychotropic medications.   Would continue to monitor for stiffness or other extrapyramidal side  effects of Seroquel.   Would continue to check a CBC  and liver function panel periodically to  screen for Depakote adverse effects.   Would ask the social worker to set this patient up with outpatient  psychiatric follow-up as soon as possible.  She is psychiatrically  cleared for discharge into a supervised environment.   Would pursue the __________.      Antonietta Breach, M.D.  Electronically Signed     JW/MEDQ  D:  02/08/2008  T:  02/08/2008  Job:  [id number]

## 2010-07-27 NOTE — Consult Note (Signed)
[name], [name]                ACCOUNT NO.:  [id number]   MEDICAL RECORD NO.:  [id number]          PATIENT TYPE:  INP   LOCATION:  9372                          FACILITY:  [facility]   PHYSICIAN:  Antonietta Breach, M.D.  DATE OF BIRTH:  04/23/1960   DATE OF CONSULTATION:  01/29/2008  DATE OF DISCHARGE:                                 CONSULTATION   REQUESTING PHYSICIAN:  Nicholaus Bloom, MD.   REASON FOR CONSULTATION:  Bizarre thoughts, ambivalence about procedure,  evaluate and recommend and assess capacity.   HISTORY OF PRESENT ILLNESS:  [name] is a 50 year old female admitted  to [facility] on January 23, 2008, due to severe  abdominal pain.   The patient has had abdominal swelling and pain.  She is requiring  further organic evaluation and treatment; however, she has been waxing  and waning back and forth on whether she will submit to exploratory  surgery.   [name] does have a history of psychosis and she is not having any  florid symptoms.   She he is not combative or agitated.  She does not have any thoughts of  harming herself or others; however, she does have an odd social  reciprocity and odd anatomical conceptions.  She makes statements there  may be a monster in there and she clearly is anxious about proceeding  with the procedure.  She is not having any thoughts of harming herself  or others.   Otherwise, she has been cooperative with care.  These symptoms appear to  be part of her baseline negative residual symptoms of schizophrenia and  do not appear to be precipitated by her medical exacerbation.  However,  time in the hospital and general support have not reversed these  residual schizophrenic negative symptoms.   PAST PSYCHIATRIC HISTORY:  [name] does have a psychiatric history.  In review of the past medical record in September during an emergency  room visit, anxiety was reported.  Also, an emergency room visit during  November of  2008 a diagnosis of bipolar disorder was listed.   She has no history of suicide attempt.  She does have a history of  auditory hallucination episodes and she demonstrates the residual  negative symptoms as described above.   She has been psychiatrically admitted to [facility].   FAMILY PSYCHIATRIC HISTORY:  None known.   SOCIAL HISTORY:  She does live in a group home.  She is not employed.  She does not use any alcohol or illegal drugs.  Her religion is [place].   PAST MEDICAL HISTORY:  Rule out abdominal lesion or infection.   MEDICATIONS:  The MAR is reviewed.  She is on:  1. Cogentin 0.5 mg daily.  2. Depakote 2000 mg q.h.s.  3. Nicoderm 14 mg patch.  4. Seroquel 150 mg q.h.s.  5. Ativan 1 mg every 6 hours p.r.n.   SHE HAS NO KNOWN DRUG ALLERGIES.   LABORATORY DATA:  WBC 14, hemoglobin 10.4, platelet count 442, SGOT 11,  SGPT 13.  HIV negative.  HCG negative.  Alcohol negative.  REVIEW OF SYSTEMS:  PSYCHIATRIC:  She has had episodes of elevated  energy in the past.  CONSTITUTIONAL, HEAD, EYES, EARS, NOSE, THROAT,  MOUTH, NEUROLOGIC, CARDIOVASCULAR, RESPIRATORY, GASTROINTESTINAL,  GENITOURINARY, SKIN, MUSCULOSKELETAL, HEMATOLOGIC, LYMPHATIC, ENDOCRINE,  METABOLIC:  All unremarkable.   EXAMINATION:  VITAL SIGNS:  Temperature is 100.  Pulse 110.  Respiratory  rate 22.  Blood pressure 110/66.  O2 saturation on room air 94%.  GENERAL APPEARANCE:  [name] is a middle-aged female sitting up in  her hospital chair with some odd social reciprocity.  Please see the  mental status exam below.  She does have occasional facial grimacing.  This likely is a response to pain but it could represent an involuntary  abnormal movement.   MENTAL STATUS EXAM:  [name] is alert.  Her eye contact is  intermittent only.  Her attention span is mildly decreased.  Concentration is overall within normal limits given that she is  distracted by pain.  Her affect involves awkward  and slightly odd social  reciprocity.  She is slightly anxious.  Her mood is slightly anxious and  it is important to note that the mood and affect are in the context of  pain.  She is oriented to all spheres.  Her memory is intact to  immediate, recent, and remote.  Her fund of knowledge and intelligence  are grossly within normal limits.  Speech involves normal rate and  prosody without dysarthria.  Thought process, please see the discussion  in the history of present illness above.  Her thought process is goal  directed, however, there is some mild illogia present.  Thought content,  no hallucinations, no delusions, however, she does have some odd  anatomical conceptions.  She has no thoughts of harming herself or  others.   Further elaboration on her odd anatomical conceptions:  She has  childlike conceptions of what might be going on in her abdomen.  Her  insight is poor.  Judgment is impaired regarding informed consent.  Please see the discussion below.   ASSESSMENT:  AXIS I:  1. 293.82, psychotic disorder, not otherwise specified.  This appears      to be at stable baseline.  2. 293.83, mood disorder, not otherwise specified, currently stable.      There is some anxiety exacerbation with her pain.  3. Rule out 295.70 schizoaffective disorder.  AXIS II:  Deferred.  AXIS III:  See past medical history.  AXIS IV:  General medical.  AXIS V:  55.   [name] does demonstrate the inability to make a consistent choice  about her further evaluating procedures.  She also lacks the ability to  appreciate the nature of the procedure due to her odd conceptions.  She  does not appreciate her risk of morbidity and mortality given possible  differential diagnosis.  She also has difficulty with reasoning at the  level of informed consent regarding the procedure.   She does not have the capacity for informed consent regarding surgical  and other evaluation methods for her abdominal pain  and lesion.   With the above stated, however, she does have psychiatric clearance to  live in a group home, maintain her ADLs, and self-hygiene.   She does agree to call emergency services for any thoughts of harming  herself or others or hallucinations or distress.   RECOMMENDATIONS:  Given that her maintenance therapy is providing the  standard of optimal response for treatment given her history and  diagnosis, would  continue her medications:  1. Depakote 2000 mg daily as her primary mood stabilizer.  2. Cogentin 0.5 mg daily for anti-extrapyramidal symptoms.  3. Seroquel 150 mg daily for anti-psychosis, anti-mania.  4. Ativan 1 mg every 6 hours p.r.n. anxiety, this probably will only      need to be continued acutely.   Would ask the social worker to set this patient up with outpatient  psychiatric followup within the first week of discharge.      Antonietta Breach, M.D.  Electronically Signed     JW/MEDQ  D:  01/31/2008  T:  01/31/2008  Job:  [id number]

## 2010-07-27 NOTE — Consult Note (Signed)
[name], [facility]                ACCOUNT NO.:  [id number]   MEDICAL RECORD NO.:  [id number]          PATIENT TYPE:  INP   LOCATION:  9372                          [facility]ACILITY[facility]  [facility]   PHYSICIAN:  Antonietta Breach, M.D.  DATE OF BIRTH:  1960/04/01   DATE OF CONSULTATION:  01/29/2008  DATE OF DISCHARGE:                                 CONSULTATION   ADDENDUM:  We continue to monitor for any stiffness or other  extrapyramidal side effects of Seroquel.      Antonietta Breach, M.D.  Electronically Signed     JW/MEDQ  D:  01/31/2008  T:  01/31/2008  Job:  [id number]

## 2010-07-27 NOTE — Op Note (Signed)
[name], [name]                ACCOUNT NO.:  [id number]   MEDICAL RECORD NO.:  [id number]          PATIENT TYPE:  INP   LOCATION:  9372                          [facility]   PHYSICIAN:  [name], M.D.   DATE OF BIRTH:  15-Dec-1960   DATE OF PROCEDURE:  01/29/2008  DATE OF DISCHARGE:                               OPERATIVE REPORT   PREOPERATIVE DIAGNOSES:  1. Acute abdomen, worsening status over the previous 48 hours.  2. Right lower quadrant sores with CT showing right lower quadrant      phlegmon, right tubo-ovarian abscess versus appendicitis.   POSTOPERATIVE DIAGNOSES:  1. Right tubo-ovarian abscess.  2. Secondary affected omentum, bowel, and periappendicitis due to the      tubo-ovarian abscess.   PROCEDURES:  1. Exploratory laparotomy.  2. Right salpingo-oophorectomy.  3. Appendectomy.  4. Omentectomy.   SURGEON:  Mary Arms, MD   ASSISTANT:  Dr. Noel [name].   ANESTHESIA:  General endotracheal.   FINDINGS:  The patient worsened in her abdominal exam over the past 48  hours.  I examined her each day since Friday, and she actually was  better on Saturday and Sunday, but Monday morning; she was little worse,  and this morning she had positive rebound and guarding.  She was  significantly worse this morning.  She was not having nausea and  vomiting.  She had no bowel movements in 48 hours.  As a result in the  light of ongoing fever, worsening abdominal exam, we got a flat and  upright, which showed early obstructive changes and definitely an ileus;  and another CT scan, which showed really no difference from the past  couple, which was phlegmonous changes in the right lower quadrant,  cannot really give a source.  We talked with Dr. Derrell [name], General  Surgery, who examined the patient.  We also reviewed the chart and felt  we were valid in proceeding for surgery.  We were concerned that it  could be a bowel complication.  He was certainly aware of that, and  we  proceeded with a notion that we would find the primary source and then  proceed there with appropriate surgery.  At the time of surgery, she had  significant postinfectious inflammatory changes of the omentum, small  bowel, epiploicae in the cecum, periappendiceal area.  This was  dissected out and found to be primarily tubo-ovarian abscess just on the  right, and left side was completely clean.  Dr. Derrell [name] decided to  proceed with right salpingo-oophorectomy, omentectomy, appendectomy, and  also removed some epiploic fat, which was necrotic, really did not have  a whole lot of pus at the time of surgery.  There was little bit of  purulent drainage but no significant collection.   DESCRIPTION OF OPERATION:  The patient was taken to the operating room  and placed in the supine position where she underwent general  endotracheal anesthesia.  She was prepped and draped in the usual  sterile fashion.  Foley catheter was placed.  Midline skin incision was  made, carried down from the  umbilicus to the pubis, and this was carried  down sharply to rectus fascia, which was taken to the peritoneal cavity.  Once I saw the area, I extended the incision well above the umbilicus to  the right, since this is the area of known pathology.  General manual  dissection was performed and it took quite some time to get the omentum  taken off, and did an omentectomy at this point with Kelly clamps and  interrupted 0 Vicryl sutures.  After removing this, we found the right  tubo-ovarian abscess.  I then did a right salpingo-oophorectomy and  dissected out the retroperitoneal space, made sure the ureter was out of  the way of the pathology.  Clamped the infundibulopelvic ligament  singly, cut it, and double suture ligated it.  I then took the pedicle  off the uterus again sharply in multiple pedicles with good hemostasis.  I then cleaned up the right lower quadrant some more with irrigation and  dissection  and was able to evaluate the appendix, which was retrocecal.  It was secondarily involved with inflammatory process and dissected off  the cecum and then did an appendectomy, did a purse-string suture around  the stump and inverted the stump back into the cecal lumen.  There was a  small area of serosal damage of the terminal ileum, and I oversewed this  interrupted 2-0 silks.  Pelvis and abdomen were then irrigated  vigorously.  The bowel was essentially run.  There was no evidence of  any inflammatory bowel disease.  The uterus and the left tube and ovary  were involved in the process, and they were left in place.  At this  point, all pedicles found to hemostatic.  All the adhesions of the bowel  loops had been taken down; and after vigorous irrigation, I did a  modified Smead-Jones closure with subcutaneous fat, fascia, muscle, and  peritoneum with a far-far near-near 0 loop PDS suture.  I put a drain in  the lower pelvis, which I exited at the right lower quadrant.  I put a  drain in the subcutaneous fat, and that exited at the left lower  quadrant.  Then, I closed the skin with skin staples.  The patient was  taken to the recovery room in good stable condition.  She experienced  very minimal blood loss.  She had a lot of ascitic fluid and lot of  purulent fluid at the time of surgery, but no clear abscess and usual  more phlegmonous changes.  Her estimated blood loss for the procedure  was less than 100 mL.  Her urine output was excellent, and her fluid was  1200 mL of lactated ringer's.  She was taken to the recovery room in  good stable condition.  All counts were correct x3.  She did not receive  any prophylactic antibiotics because she has been on Primaxin and  Zithromax for the past several days.      [name], M.D.  Electronically Signed     LHE/MEDQ  D:  01/29/2008  T:  01/30/2008  Job:  [id number]

## 2010-07-27 NOTE — Group Therapy Note (Signed)
NAME:  [name], [name]                ACCOUNT NO.:  [id number]   MEDICAL RECORD NO.:  [id number]          PATIENT TYPE:  WOC   LOCATION:  [facility]                   FACILITY:  [facility]   PHYSICIAN:  Odie Sera, D.O.    DATE OF BIRTH:  09/08/1960   DATE OF SERVICE:                                  CLINIC NOTE   CHIEF COMPLAINT:  Need to have staple removed.   The patient is a 50 year old black female who was hospitalized on  January 17 for exploratory laparotomy and right salpingo-oophorectomy,  appendectomy, and omentectomy secondary to right tube or ovarian abscess  and periappendicitis.  She states that she still has a staple that needs  to be removed.   MEDICAL HISTORY:  Depression and schizophrenia.   PAST SURGICAL HISTORY:  Cesarean section and exploratory laparotomy and  right salpingo-oophorectomy and appendectomy.   MEDICATIONS:  1. Cogentin 0.5 mg at bedtime.  2. Depakote 500 mg p.o. 4 tabs at bedtime.  3. Risperdal.  4. Seroquel 150 mg p.o. at bedtime.  5. Motrin p.r.n.   No known drug allergies.   SOCIAL HISTORY:  Former history of cocaine and alcohol use.   PHYSICAL EXAMINATION:  GENERAL:  The patient has somewhat flat affect,  but otherwise pleasant.  VITAL SIGNS:  Weight 188 pounds, BP 106/70, pulse 87, respirations 20,  temperature 97.8.  ABDOMEN:  A well-healed vertical midline incision, which extends above  the umbilicus with a staple in place at the umbilicus.  This was removed  without difficulty.  A bandage was placed.  This patient states that she  is trying to establish routine followup for gynecologic care.   IMPRESSION:  Staple removal.  Greater than 2 months postop from  laparotomy.   PLAN:  Followup for routine gynecologic care.          ______________________________  Odie Sera, D.O.    MC/MEDQ  D:  04/10/2008  T:  04/11/2008  Job:  [id number]

## 2010-08-01 ENCOUNTER — Emergency Department (HOSPITAL_COMMUNITY)
Admission: EM | Admit: 2010-08-01 | Discharge: 2010-08-01 | Disposition: A | Discharge status: Other Acute Inpatient Hospital | Payer: Medicare Other | Source: Home / Self Care | Attending: Emergency Medicine | Admitting: Emergency Medicine

## 2010-08-01 ENCOUNTER — Inpatient Hospital Stay (HOSPITAL_COMMUNITY)
Admission: RE | Admit: 2010-08-01 | Discharge: 2010-08-05 | DRG: 897 | Disposition: A | Discharge status: Home / Self Care | Payer: Medicare Other | Source: Ambulatory Visit | Attending: Psychiatry | Admitting: Psychiatry

## 2010-08-01 DIAGNOSIS — F1920 Other psychoactive substance dependence, uncomplicated: Secondary | ICD-10-CM

## 2010-08-01 DIAGNOSIS — F1020 Alcohol dependence, uncomplicated: Principal | ICD-10-CM

## 2010-08-01 DIAGNOSIS — F259 Schizoaffective disorder, unspecified: Secondary | ICD-10-CM

## 2010-08-01 LAB — POCT I-STAT, CHEM 8
Calcium, Ion: 1.14 mmol/L (ref 1.12–1.32)
Creatinine, Ser: 0.8 mg/dL (ref 0.4–1.2)
Glucose, Bld: 93 mg/dL (ref 70–99)
HCT: 45 % (ref 36.0–46.0)
Hemoglobin: 15.3 g/dL — ABNORMAL HIGH (ref 12.0–15.0)
TCO2: 24 mmol/L (ref 0–100)

## 2010-08-01 LAB — RAPID URINE DRUG SCREEN, HOSP PERFORMED
Amphetamines: NOT DETECTED
Opiates: NOT DETECTED
Tetrahydrocannabinol: NOT DETECTED

## 2010-08-02 DIAGNOSIS — F1910 Other psychoactive substance abuse, uncomplicated: Secondary | ICD-10-CM

## 2010-08-02 LAB — VALPROIC ACID LEVEL: Valproic Acid Lvl: 58.8 ug/mL (ref 50.0–100.0)

## 2010-08-03 NOTE — H&P (Signed)
NAME:  [name], [name]                ACCOUNT NO.:  [id number]  MEDICAL RECORD NO.:  [id number]           PATIENT TYPE:  I  LOCATION:  0304                          [facility]ILITY[facility]  PHYSICIAN:  Franchot Gallo, MD     DATE OF BIRTH:  31-Jul-1960  DATE OF ADMISSION:  08/01/2010 DATE OF DISCHARGE:                      PSYCHIATRIC ADMISSION ASSESSMENT   TIME OF ASSESSMENT:  17:05  IDENTIFYING INFORMATION:  A 50 year old female, African [name], this is a voluntary admission.  HISTORY OF PRESENT ILLNESS:  This is the first [facility] admission for [name] who presents requesting help with polysubstance dependence.  She says she has been unable to stop drinking or using drugs but would like to maintain sobriety and is asking for help.  She reports that she consumes a lot of alcohol every day, three to four 40 ounces beers or wine, most days of the week and has used alcohol since she was a teenager.  She also uses cocaine about three times a week.  She is not able to be clear about how long she has been abstinent in the past.  Most recently she was enrolled in a drug treatment program in [place], last December, but left the program prematurely before Christmas and returned to [place].  She denies suicidal thoughts.  She has not been taking her medications regularly since she has been drinking heavily.  PAST PSYCHIATRIC HISTORY:  She is currently followed by the ACTT team called [facility].  Her primary psychiatrist is Dr. Clyde Canterbury on that team (MD).  She has a history of schizoaffective disorder and polysubstance dependence.  She also has a recent admission to our medical unit June 21, 2010 through June 23, 2010 for chest pain secondary to cocaine use.  SOCIAL HISTORY:  This is a single [name] female with a history of polysubstance abuse who denies any current legal problems and reports that she is generally compliant with her medications but recently was disorganized while  using drugs and lapsed on her meds.  She is guarded about social history details.  FAMILY HISTORY:  She denies a family history of mental illness or substance abuse.  MEDICAL HISTORY:  No known regular primary care provider.  Medical problems are none.  PAST MEDICAL HISTORY:  Significant for: 1. Pneumonia. 2. Pelvic abscess in November 2009 with exploratory laparotomy and    momentectomy .  CURRENT MEDICATIONS: 1. Risperdal Consta dose and schedule unknown, reports her last     injection was last week. 2. Quetiapine 50 mg p.o. at bedtime. 3. Benztropine 0.5 mg p.o. at bedtime. 4. Risperdal 1 mg p.o. every a.m. 5. Depakote 500 mg 2 tablets b.i.d.  DRUG ALLERGIES:  None.  PHYSICAL EXAMINATION:  Physical exam was done in the emergency room and is noted in the record.  [name] is a large build African [name] female. Temperature 98.3, pulse 85, respirations 18, blood pressure 119/88 at the time of admission, pulse ox 94%.  Her electrolytes normal; BUN 14, creatinine 0.80.  Urine drug screen positive for cocaine.  Alcohol screen noted at less than 11 and I- stat chemistry remarkable for hemoglobin 15.3, normal sodium  and potassium, BUN 14, creatinine 0.80.  She is a normally-developed large build [name] female in no physical distress today.  No abnormal movements or signs of EPS.  MENTAL STATUS EXAM:  This is a fully alert female with guarded and blunted affect, mildly pressured speech but cooperative, good eye contact, polite, asking for help getting off drugs and alcohol, would like to be placed in a program if at all possible.  Thinking is relatively logical and appropriate.  Insight limited.  Impulse control and judgment fair.  No signs of delirium or confusion.  She is denying any dangerous thoughts, has been cooperative and appropriate here on the unit.  Axis I:  Alcohol abuse rule out dependence, polysubstance dependence. Schizoaffective disorder by  history. Axis II:  Deferred. Axis III:  No diagnosis. Axis IV:  Significant social issues. Axis V:  Current is 38, past year not known.  PLAN:  Voluntarily admit her with a goal of a safe detox.  We are going to let the [facility] ACTT Team know that she is in the hospital for substance abuse with her permission, and we started her on a Librium protocol with a goal of a safe detox and have resumed her previous medications.  She did say that she had lapsed her medications for some time prior to admission.  Her Depakote level was 58.8 in the emergency room and she is currently prescribed Risperdal at 4 mg p.o. every a.m.     Margaret A. Lorin Picket, N.P.   ______________________________ Franchot Gallo, MD    MAS/MEDQ  D:  08/02/2010  T:  08/02/2010  Job:  [id number]  Electronically Signed by Kari Baars N.P. on 08/03/2010 11:30:27 AM Electronically Signed by Franchot Gallo MD on 08/03/2010 06:46:50 PM

## 2010-08-06 NOTE — Discharge Summary (Signed)
  NAMEICYSS, SKOG                ACCOUNT NO.:  [id number]  MEDICAL RECORD NO.:  [id number]           PATIENT TYPE:  I  LOCATION:  0304                          [facility]  PHYSICIAN:  Franchot Gallo, MD     DATE OF BIRTH:  05-Dec-1960  DATE OF ADMISSION:  08/01/2010 DATE OF DISCHARGE:  08/05/2010                              DISCHARGE SUMMARY   REASON FOR ADMISSION:  This is a 50 year old female that is admitted for help with polysubstance dependence.  She has been having trouble stopping her use of alcohol and using drugs.  She was to maintain sobriety.  She denied any suicidal or homicidal thoughts.  FINAL IMPRESSION:  AXIS I:  Alcohol abuse, rule out dependence, polysubstance dependence, schizoaffective disorder by history. AXIS II:  Deferred. AXIS III:  No diagnosis. AXIS IV:  Significant social issues. AXIS V:  55.  LABORATORY DATA:  Hemoglobin of 15.3.  Alcohol was noted less than 4. Urine drug screen is positive for cocaine.  SIGNIFICANT FINDINGS:  This is a single female, somewhat guarded, blunted affect, mildly pressured speech, cooperative, good eye contact, asking for help.  Her thinking was relatively logical and appropriate. She was admitted to the substance abuse program.  We will monitor her for withdrawal symptoms.  She was placed on a Librium protocol.  She was asking to be discharged shortly after being admitted.  She was noted to be difficult to understand, seemed sedated.  She was angry and agitated. She was not interested in attending groups, feeling they were "stupid." She was denying any withdrawal symptoms.  She seemed somewhat labile. The patient was noted to be cursing as the counselor left the room.  The patient was having increased sleep.  Her appetite was better.  She was having mild to moderate depression.  No suicidal or homicidal thoughts.  Did not have any withdrawal symptoms.  We continued with her Depakote.  We decreased her Seroquel  and discontinued her Librium.  She denied any hallucinations and her thinking was goal directed.  She was in no distress from any withdrawal.  On day of discharge, the patient was asking to leave.  Her sleep was good.  Her appetite was good.  Her depression had resolved.  She denied any suicidal or homicidal thoughts, or auditory hallucinations.  She had no side effects noted to her medications.  The patient was stable for discharge.  DISCHARGE MEDICATIONS: 1. Risperdal 4 mg nightly. 2. Risperdal Consta injection due again on Aug 06, 2010. 3. Benztropine 0.5 mg one tablet nightly. 4. Depakote 500 mg taking two tablets b.i.d. 5. The patient was to stop taking her Seroquel.  FOLLOW UP:  [facility] on day of discharge at phone number [phone].     Landry Corporal, N.P.   ______________________________ Franchot Gallo, MD    JO/MEDQ  D:  08/05/2010  T:  08/06/2010  Job:  [id number]  Electronically Signed by Limmie PatriciaP. on 08/06/2010 02:17:56 PM Electronically Signed by Franchot Gallo MD on 08/06/2010 05:02:35 PM

## 2010-08-14 ENCOUNTER — Emergency Department (HOSPITAL_COMMUNITY)
Admission: EM | Admit: 2010-08-14 | Discharge: 2010-08-15 | Disposition: A | Discharge status: Home / Self Care | Payer: Medicare Other | Attending: Emergency Medicine | Admitting: Emergency Medicine

## 2010-08-14 DIAGNOSIS — R079 Chest pain, unspecified: Secondary | ICD-10-CM | POA: Insufficient documentation

## 2010-08-14 DIAGNOSIS — Z9119 Patient's noncompliance with other medical treatment and regimen: Secondary | ICD-10-CM | POA: Insufficient documentation

## 2010-08-14 DIAGNOSIS — Z79899 Other long term (current) drug therapy: Secondary | ICD-10-CM | POA: Insufficient documentation

## 2010-08-14 DIAGNOSIS — F209 Schizophrenia, unspecified: Secondary | ICD-10-CM | POA: Insufficient documentation

## 2010-08-14 DIAGNOSIS — F1910 Other psychoactive substance abuse, uncomplicated: Secondary | ICD-10-CM | POA: Insufficient documentation

## 2010-08-14 DIAGNOSIS — F319 Bipolar disorder, unspecified: Secondary | ICD-10-CM | POA: Insufficient documentation

## 2010-08-14 DIAGNOSIS — Z91199 Patient's noncompliance with other medical treatment and regimen due to unspecified reason: Secondary | ICD-10-CM | POA: Insufficient documentation

## 2010-08-14 LAB — DIFFERENTIAL
Basophils Absolute: 0 10*3/uL (ref 0.0–0.1)
Basophils Relative: 0 % (ref 0–1)
Eosinophils Absolute: 0.1 10*3/uL (ref 0.0–0.7)
Monocytes Relative: 8 % (ref 3–12)
Neutro Abs: 3.9 10*3/uL (ref 1.7–7.7)
Neutrophils Relative %: 49 % (ref 43–77)

## 2010-08-14 LAB — COMPREHENSIVE METABOLIC PANEL
Alkaline Phosphatase: 57 U/L (ref 39–117)
BUN: 9 mg/dL (ref 6–23)
CO2: 24 mEq/L (ref 19–32)
Chloride: 102 mEq/L (ref 96–112)
Creatinine, Ser: 0.75 mg/dL (ref 0.4–1.2)
GFR calc non Af Amer: >60 mL/min (ref >60)
Glucose, Bld: 89 mg/dL (ref 70–99)
Potassium: 3.8 mEq/L (ref 3.5–5.1)
Total Bilirubin: 0.2 mg/dL — ABNORMAL LOW (ref 0.3–1.2)

## 2010-08-14 LAB — URINALYSIS, ROUTINE W REFLEX MICROSCOPIC
Glucose, UA: NEGATIVE mg/dL
Ketones, ur: NEGATIVE mg/dL
Nitrite: NEGATIVE
Protein, ur: NEGATIVE mg/dL

## 2010-08-14 LAB — CBC
Hemoglobin: 12.4 g/dL (ref 12.0–15.0)
MCH: 29.7 pg (ref 26.0–34.0)
MCHC: 34.1 g/dL (ref 30.0–36.0)
Platelets: 247 10*3/uL (ref 150–400)
RBC: 4.17 MIL/uL (ref 3.87–5.11)

## 2010-08-14 LAB — RAPID URINE DRUG SCREEN, HOSP PERFORMED
Amphetamines: NOT DETECTED
Barbiturates: NOT DETECTED
Opiates: NOT DETECTED

## 2010-08-14 LAB — ETHANOL: Alcohol, Ethyl (B): 40 mg/dL — ABNORMAL HIGH (ref 0–10)

## 2010-08-14 LAB — CK TOTAL AND CKMB (NOT AT ARMC): Total CK: 133 U/L (ref 7–177)

## 2010-10-20 ENCOUNTER — Emergency Department (HOSPITAL_COMMUNITY)
Admission: EM | Admit: 2010-10-20 | Discharge: 2010-10-21 | Discharge status: Against Medical Advice | Payer: Medicare Other | Attending: Emergency Medicine | Admitting: Emergency Medicine

## 2010-10-20 DIAGNOSIS — F1910 Other psychoactive substance abuse, uncomplicated: Secondary | ICD-10-CM | POA: Insufficient documentation

## 2010-10-20 DIAGNOSIS — F209 Schizophrenia, unspecified: Secondary | ICD-10-CM | POA: Insufficient documentation

## 2010-10-20 DIAGNOSIS — F319 Bipolar disorder, unspecified: Secondary | ICD-10-CM | POA: Insufficient documentation

## 2010-10-20 DIAGNOSIS — Z79899 Other long term (current) drug therapy: Secondary | ICD-10-CM | POA: Insufficient documentation

## 2010-10-20 LAB — RAPID URINE DRUG SCREEN, HOSP PERFORMED
Benzodiazepines: NOT DETECTED
Cocaine: POSITIVE — AB
Opiates: NOT DETECTED
Tetrahydrocannabinol: NOT DETECTED

## 2010-10-20 LAB — COMPREHENSIVE METABOLIC PANEL
ALT: 17 U/L (ref 0–35)
AST: 15 U/L (ref 0–37)
Alkaline Phosphatase: 57 U/L (ref 39–117)
CO2: 27 mEq/L (ref 19–32)
Calcium: 9.4 mg/dL (ref 8.4–10.5)
Chloride: 103 mEq/L (ref 96–112)
GFR calc non Af Amer: >60 mL/min (ref >60)
Potassium: 4.1 mEq/L (ref 3.5–5.1)
Sodium: 138 mEq/L (ref 135–145)
Total Bilirubin: 0.2 mg/dL — ABNORMAL LOW (ref 0.3–1.2)

## 2010-10-20 LAB — URINALYSIS, ROUTINE W REFLEX MICROSCOPIC
Glucose, UA: NEGATIVE mg/dL
Hgb urine dipstick: NEGATIVE
Specific Gravity, Urine: 1.02 (ref 1.005–1.030)
Urobilinogen, UA: 1 mg/dL (ref 0.0–1.0)
pH: 6 (ref 5.0–8.0)

## 2010-10-20 LAB — CBC
HCT: 39.9 % (ref 36.0–46.0)
MCHC: 33.1 g/dL (ref 30.0–36.0)
MCV: 88.7 fL (ref 78.0–100.0)
RDW: 14.4 % (ref 11.5–15.5)

## 2010-10-20 LAB — DIFFERENTIAL
Basophils Absolute: 0 10*3/uL (ref 0.0–0.1)
Eosinophils Absolute: 0.1 10*3/uL (ref 0.0–0.7)
Eosinophils Relative: 1 % (ref 0–5)
Lymphocytes Relative: 36 % (ref 12–46)
Monocytes Absolute: 0.5 10*3/uL (ref 0.1–1.0)

## 2010-10-21 ENCOUNTER — Inpatient Hospital Stay (HOSPITAL_COMMUNITY): Admission: AD | Admit: 2010-10-21 | Payer: Medicare Other | Source: Ambulatory Visit | Admitting: Psychiatry

## 2010-11-15 ENCOUNTER — Emergency Department (HOSPITAL_COMMUNITY)
Admission: EM | Admit: 2010-11-15 | Discharge: 2010-11-17 | Disposition: A | Discharge status: Other Acute Inpatient Hospital | Payer: Medicare Other | Source: Home / Self Care | Attending: Emergency Medicine | Admitting: Emergency Medicine

## 2010-11-15 DIAGNOSIS — R109 Unspecified abdominal pain: Secondary | ICD-10-CM | POA: Insufficient documentation

## 2010-11-15 DIAGNOSIS — F209 Schizophrenia, unspecified: Secondary | ICD-10-CM | POA: Insufficient documentation

## 2010-11-15 DIAGNOSIS — F1020 Alcohol dependence, uncomplicated: Secondary | ICD-10-CM | POA: Insufficient documentation

## 2010-11-15 DIAGNOSIS — F319 Bipolar disorder, unspecified: Secondary | ICD-10-CM | POA: Insufficient documentation

## 2010-11-15 DIAGNOSIS — R112 Nausea with vomiting, unspecified: Secondary | ICD-10-CM | POA: Insufficient documentation

## 2010-11-15 DIAGNOSIS — Z79899 Other long term (current) drug therapy: Secondary | ICD-10-CM | POA: Insufficient documentation

## 2010-11-15 LAB — RAPID URINE DRUG SCREEN, HOSP PERFORMED
Cocaine: POSITIVE — AB
Opiates: NOT DETECTED

## 2010-11-15 LAB — DIFFERENTIAL
Eosinophils Absolute: 0.1 10*3/uL (ref 0.0–0.7)
Lymphs Abs: 2 10*3/uL (ref 0.7–4.0)
Monocytes Absolute: 0.4 10*3/uL (ref 0.1–1.0)
Monocytes Relative: 8 % (ref 3–12)
Neutrophils Relative %: 49 % (ref 43–77)

## 2010-11-15 LAB — COMPREHENSIVE METABOLIC PANEL
AST: 18 U/L (ref 0–37)
CO2: 29 mEq/L (ref 19–32)
Calcium: 9.5 mg/dL (ref 8.4–10.5)
Creatinine, Ser: 0.83 mg/dL (ref 0.50–1.10)
GFR calc Af Amer: >60 mL/min (ref >60)
GFR calc non Af Amer: >60 mL/min (ref >60)
Glucose, Bld: 102 mg/dL — ABNORMAL HIGH (ref 70–99)

## 2010-11-15 LAB — CBC
MCH: 29.5 pg (ref 26.0–34.0)
MCV: 89 fL (ref 78.0–100.0)
Platelets: 290 10*3/uL (ref 150–400)
RBC: 4.71 MIL/uL (ref 3.87–5.11)

## 2010-11-17 ENCOUNTER — Inpatient Hospital Stay (HOSPITAL_COMMUNITY)
Admission: AD | Admit: 2010-11-17 | Discharge: 2010-11-24 | DRG: 885 | Disposition: A | Discharge status: Home / Self Care | Payer: Medicare Other | Source: Ambulatory Visit | Attending: Psychiatry | Admitting: Psychiatry

## 2010-11-17 DIAGNOSIS — F259 Schizoaffective disorder, unspecified: Principal | ICD-10-CM

## 2010-11-17 DIAGNOSIS — Z7982 Long term (current) use of aspirin: Secondary | ICD-10-CM

## 2010-11-17 DIAGNOSIS — F3289 Other specified depressive episodes: Secondary | ICD-10-CM

## 2010-11-17 DIAGNOSIS — Z79899 Other long term (current) drug therapy: Secondary | ICD-10-CM

## 2010-11-17 DIAGNOSIS — F329 Major depressive disorder, single episode, unspecified: Secondary | ICD-10-CM

## 2010-11-17 DIAGNOSIS — F1420 Cocaine dependence, uncomplicated: Secondary | ICD-10-CM

## 2010-11-17 DIAGNOSIS — F609 Personality disorder, unspecified: Secondary | ICD-10-CM

## 2010-11-17 DIAGNOSIS — F1020 Alcohol dependence, uncomplicated: Secondary | ICD-10-CM

## 2010-11-18 NOTE — Assessment & Plan Note (Signed)
[name], [name]                ACCOUNT NO.:  [id number]  MEDICAL RECORD NO.:  [id number]  LOCATION:  0305                          FACILITY:  [facility]  PHYSICIAN:  Orson Aloe, MD       DATE OF BIRTH:  24-Sep-1960  DATE OF ADMISSION:  11/16/2010 DATE OF DISCHARGE:                      PSYCHIATRIC ADMISSION ASSESSMENT   IDENTIFYING INFORMATION:  This is a 50 year old African American female. Single.  This is a voluntary admission.  HISTORY OF THE PRESENT ILLNESS:  This is the 2nd known [facility] admission for [name], a single [place] American female who presents requesting help getting off crack cocaine.  Says that she has been drinking about 80 ounces of alcohol daily, and everyone in the rooming house that she lives in is getting high on cocaine every day.  She says that she has tried to stop and was trying to get admitted to long-term rehab but is asking for help getting off the drugs.  She is followed as an outpatient by [facility] for schizoaffective disorder.  She reports using cocaine most every day and called an ambulance, asking them to bring her to the emergency room.  She also called the ambulance complaining of some abdominal pain and had a negative workup in the emergency room. Today, she is a poor historian and appears mildly sedated.  She has been consistently denying any suicidal thoughts.  PAST PSYCHIATRIC HISTORY:  Currently followed as an outpatient by [facility], ACT team.  She was recently discharged from our unit May of 2012 under similar circumstances and had also asked for help with polysubstance dependence at that time.  Previous medications include Seroquel which she has taken in the past and was transitioned to Risperdal in May of 2012.  She also takes Depakote for mood stability. She denies previous suicide attempts.  She reports abuse of alcohol and cocaine for several years and is unable to give a clear report of periods of sobriety.  Previous  drug rehab stays are unknown.  SOCIAL HISTORY:  This is a single, [place] American female on disability for chronic mental illness who lives in a rooming house.  She denies any legal problems.  FAMILY HISTORY:  Not available.  MEDICAL HISTORY: 1. Primary care provider is unknown. 2. Chronic medical problems are none. 3. Past medical history is significant for episodes of chest pain with     cocaine use with negative workup, tubo-ovarian abscess,     appendectomy, exploratory laparotomy for ovarian abscess.  CURRENT MEDICATIONS: 1. Cogentin 0.5 mg 1 tablet daily at bedtime. 2. Depakote ER 500 mg b.i.d. 3. Risperdal 6 mg q.h.s.. 4. Risperdal Consta 50 mg q.14 days, last given August the 23rd. 5. Seroquel 100 mg daily.  Her ACT team reports she does not take her medications regularly.  DRUG ALLERGIES:  None.  PHYSICAL EXAM:  Done in the emergency room and noted in the record. This is an overweight, African American female in no physical distress today.  Appears sedated with slurred speech, having difficulty participating in groups and attending regular activities.  She weighs 95 kg, 5 feet 6 inches tall.  CBC is normal with a hemoglobin of 13.9. Chemistry:  Sodium  141, potassium 3.9, chloride 105, carbon dioxide 29, BUN 15, creatinine 0.83.  Random glucose is 102.  Liver enzymes are normal.  Urine drug screen positive for cocaine.  Alcohol screen negative.  MENTAL STATUS EXAM:  A drowsy, fatigued and disheveled female dressed in scrubs.  Has spent most of the day in bed.  She is attempting to cooperate, but her speech is quite slurred, and she admits that she is having problems articulating what her issues were.  She is able to make most of her basic issues known, having problems with drugs and the environment at her rooming house but admits that she is too sleepy to give a whole lot of detail.  Denies suicidal thoughts.  She is goal- oriented, asking [place] to call and get her  admitted to a treatment facility in [facility] that she is familiar with and asking [place] to please coordinate with her ACT team.  She would like to remain abstinent and get stable on her medicines.  Memory appears generally intact.  Axis I:  Schizoaffective disorder by history.  Cocaine dependence. Alcohol abuse. Axis II:  Deferred. Axis III:  No diagnosis. Axis IV:  Significant housing issues. Axis V:  Current 40, past year not known.  PLAN:  Voluntarily admit her with a goal of a safe detox and stabilizing her on basic medications and hopefully reducing or stabilizing her housing issues.  We are going to discontinue the oral Risperdal that we started her on last night.  Based on her medication report, she does appear overmedicated at this point.  We are going to add aspirin 81 mg daily for heart health, validate her Risperdal Consta dosing schedule, and we will alter her Depakote to 500 mg q.a.m. and 1000 mg q.h.s., and we will check a valproate level.  We will coordinate with her ACT team.     [name]. Lorin Picket, N.P.   ______________________________ Orson Aloe, MD    MAS/MEDQ  D:  11/17/2010  T:  11/17/2010  Job:  [id number]  Electronically Signed by Kari Baars N.P. on 11/18/2010 09:46:56 AM Electronically Signed by Orson Aloe  on 11/18/2010 04:33:56 PM

## 2010-12-01 NOTE — Discharge Summary (Signed)
NAMEJERIYAH, [name]                ACCOUNT NO.:  [id number]  MEDICAL RECORD NO.:  [id number]  LOCATION:  0305                          FACILITY:  [facility]  PHYSICIAN:  Orson Aloe, MD       DATE OF BIRTH:  04/22/1960  DATE OF ADMISSION:  11/17/2010 DATE OF DISCHARGE:  11/24/2010                              DISCHARGE SUMMARY   IDENTIFYING INFORMATION:  This is a 50 year old African American female. This is a voluntary admission.  HISTORY OF PRESENT ILLNESS:  Second known [facility] admission for [name], a single [place] female who presented requesting help getting off crack cocaine.  She said that she had been drinking about 80 ounces of alcohol daily and everyone in the rooming house where she was living was getting high on cocaine every day.  She felt helpless to stay abstinent from drugs in that atmosphere.  She said that she had tried to stop and was trying to get admitted to a long-term rehab but was not having any luck on her own.  She is followed as an outpatient by [facility] for schizoaffective disorder.  She reports using cocaine most every day and had been using alcohol most every day too at least for 1 month. Medical evaluation:  She was medically evaluated in the emergency room. She reported chronic medical problems are none.  PAST MEDICAL HISTORY:  Past medical history significant for episodes of chest pain with cocaine use with negative cardiac workup, tubo-ovarian abscess in the past.  Also appendectomy and exploratory laparotomy for ovarian abscesses.  Full physical exam was done in the emergency room where her urine drug screen was positive for cocaine and alcohol screen negative.  Chemistry reflected normal renal function and liver enzymes were normal.  She weighs 95 kg, 5 feet 6 inches tall.  COURSE OF HOSPITALIZATION:  She was admitted to our dual diagnosis unit and presented as initially quite drowsy and fatigued and disheveled, dressed in scrubs.   She had spent most of the day in bed and was attempting to cooperate but her speech was quite slurred and admitted to having a lot of problems articulating what her issues were.  Felt that the medication she was getting was making her rather sleepy, denied any suicidal thoughts and asked for help getting back off drugs.  Requested to be placed at [facility] in [place].  Thinking was logical and she did not appear acutely psychotic.  Denying any suicidal thoughts.  She was given a working diagnosis of schizoaffective disorder and cocaine dependence and alcohol abuse.  We initially had started her on Librium 25 mg p.o. q.6 h p.r.n. for withdrawal, which ultimately she did not require to control any withdrawal symptoms.  We continued h.s. Librium at 4 mg daily and restarted her on Depakote 500 mg 2 tablets twice daily.  She reported a history of Risperdal Consta 50 mg every 2 weeks and her last injection had been given on August 23.  She also reported taking Seroquel 100 mg daily q.h.s.  We worked with her medications while she was here and ultimately her Seroquel was decreased to 50 mg q.h.s., Depakote 500 mg in the morning, 1000  mg at bedtime. She has no acute symptoms of withdrawal.  Participation in group was variable.  Our case manager worked with her on securing a [facility] bed for drug rehab and she was pleased with this plan.  By the 11th, she was ready to be transferred to [facility] with no acute psychotic symptoms.  She consistently and convincingly denied any suicidal or dangerous thoughts while here.  DISCHARGE/PLAN:  Follow up with [facility] services at 8:00 a.m. on September 12 and also follow with [facility] ACT team at 7:30 a.m. on September 12.  DISCHARGE DIAGNOSIS:  AXIS I:  Polysubstance dependence. Schizoaffective disorder. AXIS II: Rule out personality disorder NOS. AXIS III: No diagnosis. AXIS IV: Moderate chronic issues with home situation and  burden of illness. AXIS V: Current 51; past year not known.  DISCHARGE MEDICATIONS: 1. ASA 81 mg daily 2. Divalproex 500 mg one q.a.m. and 2 q.h.s. 3. Seroquel 50 mg q.h.s. 4. Risperdal Consta 50 mg IM q. 14 days; this was deferred for follow-     up by her ACT team.     [name]. Lorin Picket, N.P.   ______________________________ Orson Aloe, MD    MAS/MEDQ  D:  11/25/2010  T:  11/25/2010  Job:  [id number]  Electronically Signed by Kari Baars N.P. on 11/26/2010 08:26:29 AM Electronically Signed by Orson Aloe  on 12/01/2010 01:07:19 PM

## 2010-12-14 LAB — DIFFERENTIAL
Basophils Absolute: 0 10*3/uL (ref 0.0–0.1)
Basophils Absolute: 0 10*3/uL (ref 0.0–0.1)
Basophils Absolute: 0 10*3/uL (ref 0.0–0.1)
Basophils Absolute: 0 10*3/uL (ref 0.0–0.1)
Basophils Absolute: 0 10*3/uL (ref 0.0–0.1)
Basophils Absolute: 0.1 10*3/uL (ref 0.0–0.1)
Basophils Relative: 0 % (ref 0–1)
Basophils Relative: 0 % (ref 0–1)
Basophils Relative: 0 % (ref 0–1)
Basophils Relative: 0 % (ref 0–1)
Basophils Relative: 0 % (ref 0–1)
Basophils Relative: 1 % (ref 0–1)
Basophils Relative: 1 % (ref 0–1)
Basophils Relative: 0
Eosinophils Absolute: 0.1 10*3/uL (ref 0.0–0.7)
Eosinophils Absolute: 0 10*3/uL (ref 0.0–0.7)
Eosinophils Absolute: 0.1 10*3/uL (ref 0.0–0.7)
Eosinophils Absolute: 0.2 10*3/uL (ref 0.0–0.7)
Eosinophils Absolute: 0
Eosinophils Relative: 1 % (ref 0–5)
Eosinophils Relative: 1 % (ref 0–5)
Eosinophils Relative: 1 % (ref 0–5)
Eosinophils Relative: 1 % (ref 0–5)
Eosinophils Relative: 1 % (ref 0–5)
Lymphocytes Relative: 11 % — ABNORMAL LOW (ref 12–46)
Lymphocytes Relative: 5 % — ABNORMAL LOW (ref 12–46)
Lymphocytes Relative: 6 % — ABNORMAL LOW (ref 12–46)
Lymphocytes Relative: 8 % — ABNORMAL LOW (ref 12–46)
Lymphocytes Relative: 8 % — ABNORMAL LOW (ref 12–46)
Lymphs Abs: 0.6 10*3/uL — ABNORMAL LOW (ref 0.7–4.0)
Lymphs Abs: 0.7 10*3/uL (ref 0.7–4.0)
Lymphs Abs: 0.8 10*3/uL (ref 0.7–4.0)
Lymphs Abs: 0.9 10*3/uL (ref 0.7–4.0)
Lymphs Abs: 1.2 10*3/uL (ref 0.7–4.0)
Monocytes Absolute: 0.9 10*3/uL (ref 0.1–1.0)
Monocytes Absolute: 1.1 10*3/uL — ABNORMAL HIGH (ref 0.1–1.0)
Monocytes Absolute: 1.1 10*3/uL — ABNORMAL HIGH (ref 0.1–1.0)
Monocytes Absolute: 1.3 10*3/uL — ABNORMAL HIGH (ref 0.1–1.0)
Monocytes Absolute: 1.5 10*3/uL — ABNORMAL HIGH (ref 0.1–1.0)
Monocytes Absolute: 0.1
Monocytes Relative: 7 % (ref 3–12)
Monocytes Relative: 11 % (ref 3–12)
Monocytes Relative: 12 % (ref 3–12)
Monocytes Relative: 5 % (ref 3–12)
Monocytes Relative: 6 % (ref 3–12)
Monocytes Relative: 8 % (ref 3–12)
Monocytes Relative: 9 % (ref 3–12)
Monocytes Relative: 9 % (ref 3–12)
Monocytes Relative: 2 — ABNORMAL LOW
Neutro Abs: 10.7 10*3/uL — ABNORMAL HIGH (ref 1.7–7.7)
Neutro Abs: 11.5 10*3/uL — ABNORMAL HIGH (ref 1.7–7.7)
Neutro Abs: 13.7 10*3/uL — ABNORMAL HIGH (ref 1.7–7.7)
Neutro Abs: 18.5 10*3/uL — ABNORMAL HIGH (ref 1.7–7.7)
Neutro Abs: 4.4 10*3/uL (ref 1.7–7.7)
Neutro Abs: 8.2 10*3/uL — ABNORMAL HIGH (ref 1.7–7.7)
Neutro Abs: 8.9 10*3/uL — ABNORMAL HIGH (ref 1.7–7.7)
Neutrophils Relative %: 84 % — ABNORMAL HIGH (ref 43–77)
Neutrophils Relative %: 85 % — ABNORMAL HIGH (ref 43–77)
Neutrophils Relative %: 86 % — ABNORMAL HIGH (ref 43–77)
Neutrophils Relative %: 86 % — ABNORMAL HIGH (ref 43–77)
Neutrophils Relative %: 88 % — ABNORMAL HIGH (ref 43–77)

## 2010-12-14 LAB — CBC
HCT: 28.4 % — ABNORMAL LOW (ref 36.0–46.0)
HCT: 30.5 % — ABNORMAL LOW (ref 36.0–46.0)
HCT: 27.8 % — ABNORMAL LOW (ref 36.0–46.0)
HCT: 27.9 % — ABNORMAL LOW (ref 36.0–46.0)
HCT: 30.1 % — ABNORMAL LOW (ref 36.0–46.0)
HCT: 30.5 % — ABNORMAL LOW (ref 36.0–46.0)
HCT: 30.8 % — ABNORMAL LOW (ref 36.0–46.0)
HCT: 33.2 % — ABNORMAL LOW (ref 36.0–46.0)
Hemoglobin: 9.7 g/dL — ABNORMAL LOW (ref 12.0–15.0)
Hemoglobin: 10.1 g/dL — ABNORMAL LOW (ref 12.0–15.0)
Hemoglobin: 10.1 g/dL — ABNORMAL LOW (ref 12.0–15.0)
Hemoglobin: 10.3 g/dL — ABNORMAL LOW (ref 12.0–15.0)
Hemoglobin: 10.4 g/dL — ABNORMAL LOW (ref 12.0–15.0)
Hemoglobin: 10.9 g/dL — ABNORMAL LOW (ref 12.0–15.0)
Hemoglobin: 8.4 g/dL — ABNORMAL LOW (ref 12.0–15.0)
Hemoglobin: 9.2 g/dL — ABNORMAL LOW (ref 12.0–15.0)
Hemoglobin: 9.9 g/dL — ABNORMAL LOW (ref 12.0–15.0)
Hemoglobin: 12.8
MCHC: 34.3 g/dL (ref 30.0–36.0)
MCHC: 33 g/dL (ref 30.0–36.0)
MCHC: 33.4 g/dL (ref 30.0–36.0)
MCHC: 33.5 g/dL (ref 30.0–36.0)
MCHC: 34.1 g/dL (ref 30.0–36.0)
MCHC: 34.1 g/dL (ref 30.0–36.0)
MCHC: 34.1 g/dL (ref 30.0–36.0)
MCV: 89.5 fL (ref 78.0–100.0)
MCV: 89.7 fL (ref 78.0–100.0)
MCV: 87.3 fL (ref 78.0–100.0)
MCV: 87.7 fL (ref 78.0–100.0)
MCV: 87.8 fL (ref 78.0–100.0)
MCV: 88.4 fL (ref 78.0–100.0)
MCV: 89 fL (ref 78.0–100.0)
Platelets: 358 10*3/uL (ref 150–400)
Platelets: 452 10*3/uL — ABNORMAL HIGH (ref 150–400)
Platelets: 548 10*3/uL — ABNORMAL HIGH (ref 150–400)
Platelets: 559 10*3/uL — ABNORMAL HIGH (ref 150–400)
Platelets: 292
RBC: 3.17 MIL/uL — ABNORMAL LOW (ref 3.87–5.11)
RBC: 3.01 MIL/uL — ABNORMAL LOW (ref 3.87–5.11)
RBC: 3.18 MIL/uL — ABNORMAL LOW (ref 3.87–5.11)
RBC: 3.32 MIL/uL — ABNORMAL LOW (ref 3.87–5.11)
RBC: 3.33 MIL/uL — ABNORMAL LOW (ref 3.87–5.11)
RBC: 3.45 MIL/uL — ABNORMAL LOW (ref 3.87–5.11)
RBC: 3.46 MIL/uL — ABNORMAL LOW (ref 3.87–5.11)
RDW: 14.2 % (ref 11.5–15.5)
RDW: 14.8 % (ref 11.5–15.5)
RDW: 15.1 % (ref 11.5–15.5)
RDW: 15.2 % (ref 11.5–15.5)
RDW: 15.3 % (ref 11.5–15.5)
RDW: 15.3 % (ref 11.5–15.5)
RDW: 15.5 % (ref 11.5–15.5)
RDW: 13.7
WBC: 13.4 10*3/uL — ABNORMAL HIGH (ref 4.0–10.5)
WBC: 10.4 10*3/uL (ref 4.0–10.5)
WBC: 10.8 10*3/uL — ABNORMAL HIGH (ref 4.0–10.5)
WBC: 11.4 10*3/uL — ABNORMAL HIGH (ref 4.0–10.5)
WBC: 7.3 10*3/uL (ref 4.0–10.5)
WBC: 9.6 10*3/uL (ref 4.0–10.5)

## 2010-12-14 LAB — COMPREHENSIVE METABOLIC PANEL
ALT: 13 U/L (ref 0–35)
ALT: 14 U/L (ref 0–35)
ALT: 17
AST: 14 U/L (ref 0–37)
AST: 14 U/L (ref 0–37)
AST: 16 U/L (ref 0–37)
AST: 18 U/L (ref 0–37)
AST: 19
Albumin: 2.1 g/dL — ABNORMAL LOW (ref 3.5–5.2)
Albumin: 1.7 g/dL — ABNORMAL LOW (ref 3.5–5.2)
Albumin: 1.8 g/dL — ABNORMAL LOW (ref 3.5–5.2)
Albumin: 2.9 — ABNORMAL LOW
Alkaline Phosphatase: 35 U/L — ABNORMAL LOW (ref 39–117)
Alkaline Phosphatase: 51 U/L (ref 39–117)
Alkaline Phosphatase: 51
BUN: 3 mg/dL — ABNORMAL LOW (ref 6–23)
BUN: 7 mg/dL (ref 6–23)
BUN: 9 mg/dL (ref 6–23)
CO2: 27 mEq/L (ref 19–32)
CO2: 31 mEq/L (ref 19–32)
Calcium: 7.2 mg/dL — ABNORMAL LOW (ref 8.4–10.5)
Calcium: 7.7 mg/dL — ABNORMAL LOW (ref 8.4–10.5)
Calcium: 7.7 mg/dL — ABNORMAL LOW (ref 8.4–10.5)
Chloride: 100 mEq/L (ref 96–112)
Chloride: 102 mEq/L (ref 96–112)
Chloride: 105 mEq/L (ref 96–112)
Chloride: 95 mEq/L — ABNORMAL LOW (ref 96–112)
Chloride: 97 mEq/L (ref 96–112)
Creatinine, Ser: 0.8 mg/dL (ref 0.4–1.2)
Creatinine, Ser: 0.81 mg/dL (ref 0.4–1.2)
Creatinine, Ser: 1.13 mg/dL (ref 0.4–1.2)
Creatinine, Ser: 1.44 mg/dL — ABNORMAL HIGH (ref 0.4–1.2)
GFR calc Af Amer: 47 mL/min — ABNORMAL LOW (ref >60)
GFR calc Af Amer: >60 mL/min (ref >60)
GFR calc Af Amer: >60 mL/min (ref >60)
GFR calc Af Amer: >60 mL/min (ref >60)
GFR calc non Af Amer: 55 mL/min — ABNORMAL LOW (ref >60)
GFR calc non Af Amer: >60 mL/min (ref >60)
Glucose, Bld: 137 mg/dL — ABNORMAL HIGH (ref 70–99)
Glucose, Bld: 117 mg/dL — ABNORMAL HIGH (ref 70–99)
Glucose, Bld: 119 mg/dL — ABNORMAL HIGH (ref 70–99)
Glucose, Bld: 145 mg/dL — ABNORMAL HIGH (ref 70–99)
Glucose, Bld: 148 mg/dL — ABNORMAL HIGH (ref 70–99)
Potassium: 3.4 mEq/L — ABNORMAL LOW (ref 3.5–5.1)
Potassium: 3.6 mEq/L (ref 3.5–5.1)
Potassium: 5.2 mEq/L — ABNORMAL HIGH (ref 3.5–5.1)
Potassium: 4
Sodium: 134 mEq/L — ABNORMAL LOW (ref 135–145)
Sodium: 136 mEq/L (ref 135–145)
Sodium: 137 mEq/L (ref 135–145)
Sodium: 139 mEq/L (ref 135–145)
Sodium: 140 mEq/L (ref 135–145)
Sodium: 139
Total Bilirubin: 0.3 mg/dL (ref 0.3–1.2)
Total Bilirubin: 0.3 mg/dL (ref 0.3–1.2)
Total Bilirubin: 0.4 mg/dL (ref 0.3–1.2)
Total Bilirubin: 0.4 mg/dL (ref 0.3–1.2)
Total Bilirubin: 0.5 mg/dL (ref 0.3–1.2)
Total Protein: 4.1 g/dL — ABNORMAL LOW (ref 6.0–8.3)
Total Protein: 5 g/dL — ABNORMAL LOW (ref 6.0–8.3)
Total Protein: 6.6

## 2010-12-14 LAB — CREATININE, SERUM: GFR calc non Af Amer: 36 mL/min — ABNORMAL LOW (ref >60)

## 2010-12-14 LAB — ANAEROBIC CULTURE

## 2010-12-14 LAB — WOUND CULTURE: Culture: NO GROWTH

## 2010-12-14 LAB — URINE MICROSCOPIC-ADD ON

## 2010-12-14 LAB — LEGIONELLA ANTIGEN, URINE: Legionella Antigen, Urine: NEGATIVE

## 2010-12-14 LAB — BLOOD GAS, ARTERIAL
Bicarbonate: 26 mEq/L — ABNORMAL HIGH (ref 20.0–24.0)
pH, Arterial: 7.28 — ABNORMAL LOW (ref 7.350–7.400)
pO2, Arterial: 179 mmHg — ABNORMAL HIGH (ref 80.0–100.0)

## 2010-12-14 LAB — SODIUM, URINE, RANDOM: Sodium, Ur: 82 mEq/L

## 2010-12-14 LAB — CULTURE, BLOOD (ROUTINE X 2): Culture: NO GROWTH

## 2010-12-14 LAB — RPR: RPR Ser Ql: NONREACTIVE

## 2010-12-14 LAB — ETHANOL: Alcohol, Ethyl (B): <5

## 2010-12-14 LAB — URINALYSIS, ROUTINE W REFLEX MICROSCOPIC
Bilirubin Urine: NEGATIVE
Glucose, UA: NEGATIVE
Hgb urine dipstick: NEGATIVE
Ketones, ur: NEGATIVE mg/dL
Nitrite: NEGATIVE
Protein, ur: 30 — AB
Specific Gravity, Urine: 1.015 (ref 1.005–1.030)
Urobilinogen, UA: 0.2 mg/dL (ref 0.0–1.0)
pH: 6

## 2010-12-14 LAB — HIV-1 RNA QUANT-NO REFLEX-BLD
HIV 1 RNA Quant: <48 copies/mL (ref <48)
HIV-1 RNA Quant, Log: <1.68 {Log} (ref <1.68)

## 2010-12-14 LAB — URINE CULTURE
Culture: NO GROWTH
Special Requests: POSITIVE

## 2010-12-14 LAB — WET PREP, GENITAL

## 2010-12-14 LAB — TYPE AND SCREEN: Antibody Screen: NEGATIVE

## 2010-12-14 LAB — LIPASE, BLOOD: Lipase: 11 U/L (ref 11–59)

## 2010-12-21 LAB — URINALYSIS, ROUTINE W REFLEX MICROSCOPIC
Bilirubin Urine: NEGATIVE
Glucose, UA: NEGATIVE
Hgb urine dipstick: NEGATIVE
Ketones, ur: 15 — AB
Nitrite: NEGATIVE
Protein, ur: NEGATIVE
Specific Gravity, Urine: 1.023
Urobilinogen, UA: 0.2
pH: 7

## 2010-12-21 LAB — DIFFERENTIAL
Basophils Absolute: 0.1
Basophils Relative: 1
Eosinophils Absolute: 0.1 — ABNORMAL LOW
Eosinophils Relative: 2
Lymphocytes Relative: 46
Lymphs Abs: 3.2
Monocytes Absolute: 0.7
Monocytes Relative: 9
Neutro Abs: 2.9
Neutrophils Relative %: 41 — ABNORMAL LOW

## 2010-12-21 LAB — CBC
HCT: 37.8
Hemoglobin: 12.7
MCHC: 33.6
MCV: 88.7
Platelets: 308
RBC: 4.26
RDW: 14.2
WBC: 6.9

## 2010-12-21 LAB — HEPATIC FUNCTION PANEL
ALT: 15
AST: 17
Albumin: 3.2 — ABNORMAL LOW
Alkaline Phosphatase: 36 — ABNORMAL LOW
Bilirubin, Direct: 0.1
Indirect Bilirubin: 0.3
Total Bilirubin: 0.4
Total Protein: 6

## 2010-12-21 LAB — RAPID URINE DRUG SCREEN, HOSP PERFORMED
Amphetamines: NOT DETECTED
Barbiturates: NOT DETECTED
Benzodiazepines: NOT DETECTED
Cocaine: NOT DETECTED
Opiates: NOT DETECTED
Tetrahydrocannabinol: NOT DETECTED

## 2010-12-21 LAB — ETHANOL: Alcohol, Ethyl (B): <5

## 2010-12-21 LAB — BASIC METABOLIC PANEL
BUN: 10
CO2: 29
Calcium: 8.7
Chloride: 103
Creatinine, Ser: 0.88
GFR calc Af Amer: >60
GFR calc non Af Amer: >60
Glucose, Bld: 142 — ABNORMAL HIGH
Potassium: 3.8
Sodium: 135

## 2010-12-21 LAB — VALPROIC ACID LEVEL: Valproic Acid Lvl: 51.8

## 2010-12-21 LAB — LIPASE, BLOOD: Lipase: 26

## 2011-04-02 ENCOUNTER — Encounter (HOSPITAL_COMMUNITY): Payer: Self-pay | Admitting: *Deleted

## 2011-04-02 ENCOUNTER — Emergency Department (HOSPITAL_COMMUNITY)
Admission: EM | Admit: 2011-04-02 | Discharge: 2011-04-02 | Disposition: A | Discharge status: Other Acute Inpatient Hospital | Payer: Medicare Other | Source: Home / Self Care | Attending: Emergency Medicine | Admitting: Emergency Medicine

## 2011-04-02 ENCOUNTER — Inpatient Hospital Stay (HOSPITAL_COMMUNITY)
Admission: AD | Admit: 2011-04-02 | Discharge: 2011-04-05 | DRG: 897 | Disposition: A | Discharge status: Home / Self Care | Payer: Medicare Other | Attending: Psychiatry | Admitting: Psychiatry

## 2011-04-02 DIAGNOSIS — F1420 Cocaine dependence, uncomplicated: Secondary | ICD-10-CM

## 2011-04-02 DIAGNOSIS — F29 Unspecified psychosis not due to a substance or known physiological condition: Secondary | ICD-10-CM

## 2011-04-02 DIAGNOSIS — F1410 Cocaine abuse, uncomplicated: Secondary | ICD-10-CM

## 2011-04-02 DIAGNOSIS — Z79899 Other long term (current) drug therapy: Secondary | ICD-10-CM

## 2011-04-02 DIAGNOSIS — F319 Bipolar disorder, unspecified: Secondary | ICD-10-CM | POA: Insufficient documentation

## 2011-04-02 DIAGNOSIS — E669 Obesity, unspecified: Secondary | ICD-10-CM

## 2011-04-02 DIAGNOSIS — F1010 Alcohol abuse, uncomplicated: Secondary | ICD-10-CM | POA: Insufficient documentation

## 2011-04-02 DIAGNOSIS — Z9119 Patient's noncompliance with other medical treatment and regimen: Secondary | ICD-10-CM

## 2011-04-02 DIAGNOSIS — F1020 Alcohol dependence, uncomplicated: Secondary | ICD-10-CM

## 2011-04-02 DIAGNOSIS — F1920 Other psychoactive substance dependence, uncomplicated: Secondary | ICD-10-CM | POA: Diagnosis present

## 2011-04-02 DIAGNOSIS — F10239 Alcohol dependence with withdrawal, unspecified: Secondary | ICD-10-CM

## 2011-04-02 DIAGNOSIS — F259 Schizoaffective disorder, unspecified: Secondary | ICD-10-CM

## 2011-04-02 DIAGNOSIS — Z8659 Personal history of other mental and behavioral disorders: Secondary | ICD-10-CM | POA: Insufficient documentation

## 2011-04-02 DIAGNOSIS — F1910 Other psychoactive substance abuse, uncomplicated: Secondary | ICD-10-CM

## 2011-04-02 DIAGNOSIS — Z6837 Body mass index (BMI) 37.0-37.9, adult: Secondary | ICD-10-CM

## 2011-04-02 DIAGNOSIS — F99 Mental disorder, not otherwise specified: Secondary | ICD-10-CM | POA: Diagnosis present

## 2011-04-02 DIAGNOSIS — F17200 Nicotine dependence, unspecified, uncomplicated: Secondary | ICD-10-CM | POA: Insufficient documentation

## 2011-04-02 DIAGNOSIS — G40909 Epilepsy, unspecified, not intractable, without status epilepticus: Secondary | ICD-10-CM

## 2011-04-02 DIAGNOSIS — M129 Arthropathy, unspecified: Secondary | ICD-10-CM | POA: Insufficient documentation

## 2011-04-02 DIAGNOSIS — Z91199 Patient's noncompliance with other medical treatment and regimen due to unspecified reason: Secondary | ICD-10-CM

## 2011-04-02 DIAGNOSIS — F10939 Alcohol use, unspecified with withdrawal, unspecified: Secondary | ICD-10-CM

## 2011-04-02 DIAGNOSIS — R45851 Suicidal ideations: Secondary | ICD-10-CM

## 2011-04-02 HISTORY — DX: Schizoaffective disorder, bipolar type: F25.0

## 2011-04-02 HISTORY — DX: Bipolar disorder, unspecified: F31.9

## 2011-04-02 HISTORY — DX: Obesity, unspecified: E66.9

## 2011-04-02 HISTORY — DX: Unspecified convulsions: R56.9

## 2011-04-02 HISTORY — DX: Schizoaffective disorder, unspecified: F25.9

## 2011-04-02 HISTORY — DX: Mental disorder, not otherwise specified: F99

## 2011-04-02 HISTORY — DX: Unspecified osteoarthritis, unspecified site: M19.90

## 2011-04-02 LAB — COMPREHENSIVE METABOLIC PANEL
ALT: 20 U/L (ref 0–35)
AST: 20 U/L (ref 0–37)
CO2: 27 mEq/L (ref 19–32)
Calcium: 9.2 mg/dL (ref 8.4–10.5)
GFR calc non Af Amer: >90 mL/min (ref >90)
Potassium: 4.4 mEq/L (ref 3.5–5.1)
Sodium: 140 mEq/L (ref 135–145)

## 2011-04-02 LAB — ETHANOL: Alcohol, Ethyl (B): <11 mg/dL (ref 0–11)

## 2011-04-02 LAB — DIFFERENTIAL
Basophils Absolute: 0.1 10*3/uL (ref 0.0–0.1)
Eosinophils Relative: 2 % (ref 0–5)
Lymphocytes Relative: 42 % (ref 12–46)
Neutro Abs: 2.2 10*3/uL (ref 1.7–7.7)
Neutrophils Relative %: 46 % (ref 43–77)

## 2011-04-02 LAB — RAPID URINE DRUG SCREEN, HOSP PERFORMED
Amphetamines: NOT DETECTED
Barbiturates: NOT DETECTED
Benzodiazepines: NOT DETECTED

## 2011-04-02 LAB — CBC
Platelets: 268 10*3/uL (ref 150–400)
RDW: 14.7 % (ref 11.5–15.5)
WBC: 4.8 10*3/uL (ref 4.0–10.5)

## 2011-04-02 MED ORDER — DIVALPROEX SODIUM ER 500 MG PO TB24
1000.0000 mg | ORAL_TABLET | ORAL | Status: DC
  Administered 2011-04-02 – 2011-04-05 (×6): 1000 mg via ORAL
  Filled 2011-04-02 (×7): qty 2

## 2011-04-02 MED ORDER — CHLORDIAZEPOXIDE HCL 25 MG PO CAPS
25.0000 mg | ORAL_CAPSULE | ORAL | Status: DC
  Administered 2011-04-05: 25 mg via ORAL
  Filled 2011-04-02: qty 1

## 2011-04-02 MED ORDER — THIAMINE HCL 100 MG/ML IJ SOLN: 100.0000 mg | Freq: Once | INTRAMUSCULAR | Status: DC

## 2011-04-02 MED ORDER — CHLORDIAZEPOXIDE HCL 25 MG PO CAPS: 25.0000 mg | ORAL_CAPSULE | Freq: Once | ORAL | Status: DC

## 2011-04-02 MED ORDER — CHLORDIAZEPOXIDE HCL 25 MG PO CAPS: 25.0000 mg | ORAL_CAPSULE | Freq: Every day | ORAL | Status: DC

## 2011-04-02 MED ORDER — SODIUM CHLORIDE 0.9 % IV SOLN
Freq: Once | INTRAVENOUS | Status: AC
  Administered 2011-04-02: 11:00:00 via INTRAVENOUS

## 2011-04-02 MED ORDER — SODIUM CHLORIDE 0.9 % IN NEBU: 3.0000 mL | INHALATION_SOLUTION | Freq: Three times a day (TID) | RESPIRATORY_TRACT | Status: DC | PRN

## 2011-04-02 MED ORDER — VITAMIN B-1 100 MG PO TABS
100.0000 mg | ORAL_TABLET | Freq: Every day | ORAL | Status: DC
  Administered 2011-04-03 – 2011-04-05 (×3): 100 mg via ORAL
  Filled 2011-04-02 (×4): qty 1

## 2011-04-02 MED ORDER — HYDROXYZINE HCL 25 MG PO TABS
25.0000 mg | ORAL_TABLET | Freq: Four times a day (QID) | ORAL | Status: DC | PRN
  Administered 2011-04-03: 25 mg via ORAL

## 2011-04-02 MED ORDER — CHLORDIAZEPOXIDE HCL 25 MG PO CAPS
25.0000 mg | ORAL_CAPSULE | Freq: Three times a day (TID) | ORAL | Status: AC
  Administered 2011-04-04 (×3): 25 mg via ORAL
  Filled 2011-04-02 (×2): qty 1

## 2011-04-02 MED ORDER — QUETIAPINE FUMARATE 50 MG PO TABS
50.0000 mg | ORAL_TABLET | ORAL | Status: DC
  Administered 2011-04-02 – 2011-04-03 (×2): 50 mg via ORAL
  Filled 2011-04-02 (×5): qty 1

## 2011-04-02 MED ORDER — BENZTROPINE MESYLATE 0.5 MG PO TABS
0.5000 mg | ORAL_TABLET | ORAL | Status: DC
  Administered 2011-04-02 – 2011-04-04 (×5): 0.5 mg via ORAL
  Administered 2011-04-05: 08:00:00 via ORAL
  Filled 2011-04-02 (×8): qty 1

## 2011-04-02 MED ORDER — CHLORDIAZEPOXIDE HCL 25 MG PO CAPS
25.0000 mg | ORAL_CAPSULE | Freq: Four times a day (QID) | ORAL | Status: AC
  Administered 2011-04-02 – 2011-04-03 (×4): 25 mg via ORAL
  Filled 2011-04-02 (×6): qty 1

## 2011-04-02 MED ORDER — ADULT MULTIVITAMIN W/MINERALS CH
1.0000 | ORAL_TABLET | Freq: Every day | ORAL | Status: DC
  Administered 2011-04-03 – 2011-04-05 (×3): 1 via ORAL
  Filled 2011-04-02 (×3): qty 1

## 2011-04-02 MED ORDER — LOPERAMIDE HCL 2 MG PO CAPS: 2.0000 mg | ORAL_CAPSULE | ORAL | Status: DC | PRN

## 2011-04-02 MED ORDER — CHLORDIAZEPOXIDE HCL 25 MG PO CAPS
25.0000 mg | ORAL_CAPSULE | Freq: Four times a day (QID) | ORAL | Status: DC | PRN
  Administered 2011-04-03: 25 mg via ORAL
  Filled 2011-04-02: qty 1

## 2011-04-02 MED ORDER — CHLORDIAZEPOXIDE HCL 25 MG PO CAPS
50.0000 mg | ORAL_CAPSULE | Freq: Once | ORAL | Status: AC
  Administered 2011-04-02: 50 mg via ORAL
  Filled 2011-04-02: qty 1

## 2011-04-02 MED ORDER — RISPERIDONE 2 MG PO TABS
4.0000 mg | ORAL_TABLET | Freq: Every day | ORAL | Status: DC
  Administered 2011-04-02 – 2011-04-05 (×4): 4 mg via ORAL
  Filled 2011-04-02 (×5): qty 2

## 2011-04-02 MED ORDER — ONDANSETRON 4 MG PO TBDP: 4.0000 mg | ORAL_TABLET | Freq: Four times a day (QID) | ORAL | Status: DC | PRN

## 2011-04-02 NOTE — ED Notes (Signed)
Dr Delo into see 

## 2011-04-02 NOTE — ED Notes (Signed)
Patient denies pain and is resting comfortably.  

## 2011-04-02 NOTE — ED Notes (Signed)
1 bag of belongings taken to [facility] w/ pt

## 2011-04-02 NOTE — ED Notes (Signed)
Up to the bathroom 

## 2011-04-02 NOTE — ED Notes (Signed)
Writer notified by ACT pt has been accepted to [facility] by Dr. Allena Katz to Dr. Koren Shiver Room 304 Bed 2. Writer will notify pt's RN and MD of disposition.
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
## 2011-04-02 NOTE — ED Notes (Signed)
Dr Judd Lien aware of pt's complaints

## 2011-04-02 NOTE — ED Notes (Signed)
One bag belongings locked in [place]

## 2011-04-02 NOTE — ED Notes (Signed)
Vital signs stable. 

## 2011-04-02 NOTE — H&P (Signed)
Psychiatric Admission Assessment Adult  Patient Identification:  Markeya Mincy Date of Evaluation:  04/02/2011  51yoDAAF History of Present Illness::  Came to the ED requesting Detox. UDS+cocaine no alcohol. Is followed by [facility] Act team and wants to move. Apparently a lot of drinking and drugging goes on at the boarding house where she resides. Says her mother passed at age 50 maybe 3 weeks ago from Dementia. She feels all alone and doesn't want to end up with a stroke like one of her neighbors.    Past Psychiatric History: Reports prior detoxes at [facility] 3-4  Here last year May and August for same.  Substance Abuse History: Long history for cocaine   Social History:    reports that she has been smoking Cigarettes.  She has been smoking about 1 pack per day. She does not have any smokeless tobacco history on file. She reports that she drinks about 6 ounces of alcohol per week. She reports that she uses illicit drugs (Cocaine and Marijuana).  Family Psych History: Daughter age 63 is in jail and son 70 has been there too.   Past Medical History:     Past Medical History  Diagnosis Date  . Arthritis   . Seizures   . Psychiatric illness   . Bipolar 1 disorder   . Schizo-affective schizophrenia   . Obesity        Past Surgical History  Procedure Date  . Stomach surgery     Allergies: No Known Allergies  Current Medications:  Prior to Admission medications   Medication Sig Start Date End Date Taking? Authorizing Provider  benztropine (COGENTIN) 0.5 MG tablet Take by mouth 2 (two) times daily. Unsure of dose   Yes Historical Provider, MD  divalproex (DEPAKOTE) 500 MG 24 hr tablet Take 1,000 mg by mouth 2 (two) times daily.     Yes Historical Provider, MD  risperidone (RISPERDAL) 4 MG tablet Take 4 mg by mouth daily.      Historical Provider, MD    Mental Status Examination/Evaluation: Objective:  Appearance: Fairly Groomed  Psychomotor Activity:   Decreased  Eye Contact::  Good  Speech:  Clear and Coherent  Volume:  Normal  Mood: depressed/anxious    Affect:  Congruent  Thought Process: clear rational goal oriented -wants to move    Orientation:  Full  Thought Content: denies AVH   Suicidal Thoughts:  No  Homicidal Thoughts:  No  Judgement:  Impaired  Insight:  Fair    DIAGNOSIS:    AXIS I Schizoaffective Disorder Cocaine abuse   AXIS II Deferred  AXIS III See medical history.  AXIS IV housing problems and bereavement mother passed 3 weeks ago   AXIS V 41-50 serious symptoms     Treatment Plan Summary: Admit for safety and stabilization. Adjust meds as indicated. Contact [facility] re: Risperdal Const dose and next due date. Case manager to help with relocation-too many drugs where she lives.   Agree with H&P from ED   [facility] PA-C

## 2011-04-02 NOTE — Progress Notes (Signed)
Pt experiencing back pain, states that she does not know what from and would like for the doctor to address this and her "swollen" abdomen in morning.  Pt does not appear in acute distress, eating snack and watching television in dayroom.  Heat pack given, as well as Tylenol as ordered.  Pt appears to be child-like and did not participate in group this evening.  Pt may do better programming on [address] hall, appears limited.  Support and encouragement offered, will continue to monitor.

## 2011-04-02 NOTE — ED Notes (Addendum)
Per ems pt is from home. Alert and oriented x4, ambulatory. Pt reports that she had a headache and called ems for a headache. On arrival pt requested detox for etoh, crack, and cocaine. Reports last use of drugs was 2100 04/01/11, last night. No withdrawal symptoms noted. ems reports pt has called ems for transport to hospital for detox in the past before.

## 2011-04-02 NOTE — ED Notes (Signed)
[facility] will call back for report 
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
## 2011-04-02 NOTE — ED Notes (Signed)
MD at bedside. 

## 2011-04-02 NOTE — Tx Team (Signed)
Initial Interdisciplinary Treatment Plan  PATIENT STRENGTHS: (choose at least two) Motivation for treatment/growth  PATIENT STRESSORS: Substance abuse   PROBLEM LIST: Problem List/Patient Goals Date to be addressed Date deferred Reason deferred Estimated date of resolution  Substance abuse 04/02/2011           Depression 04/02/2011                                          DISCHARGE CRITERIA:  Improved stabilization in mood, thinking, and/or behavior Withdrawal symptoms are absent or subacute and managed without 24-hour nursing intervention  PRELIMINARY DISCHARGE PLAN: Attend aftercare/continuing care group Placement in alternative living arrangements  PATIENT/FAMIILY INVOLVEMENT: This treatment plan has been presented to and reviewed with the patient, Avina Eberle, and/or family member, .  The patient and family have been given the opportunity to ask questions and make suggestions.  Jacquelyne Balint Shanta 04/02/2011, 5:52 PM

## 2011-04-02 NOTE — ED Provider Notes (Signed)
History     CSN: [id number]  Arrival date & time 04/02/11  1017   First MD Initiated Contact with Patient 04/02/11 1035      Chief Complaint  Patient presents with  . Medical Clearance    (Consider location/radiation/quality/duration/timing/severity/associated sxs/prior treatment) HPI Comments: Patient presents wanting help with detox from alcohol and crack.  Last used both last night.  Denies SI or HI.  No chest pain or shortness of breath.  No other complaints.  Patient is a 52 y.o. female presenting with drug problem. The history is provided by the patient.  Drug Problem This is a chronic problem. The problem occurs constantly. The symptoms are aggravated by nothing. The symptoms are relieved by nothing. She has tried nothing for the symptoms.    Past Medical History  Diagnosis Date  . Arthritis   . Seizures   . Psychiatric illness   . Bipolar 1 disorder   . Schizo-affective schizophrenia   . Obesity     Past Surgical History  Procedure Date  . Stomach surgery     History reviewed. No pertinent family history.  History  Substance Use Topics  . Smoking status: Current Everyday Smoker  . Smokeless tobacco: Not on file  . Alcohol Use: 6.0 oz/week    4 Glasses of wine, 6 Cans of beer per week    OB History    Grav Para Term Preterm Abortions TAB SAB Ect Mult Living                  Review of Systems  All other systems reviewed and are negative.    Allergies  Review of patient's allergies indicates no known allergies.  Home Medications   Current Outpatient Rx  Name Route Sig Dispense Refill  . BENZTROPINE MESYLATE 0.5 MG PO TABS Oral Take by mouth 2 (two) times daily. Unsure of dose    . TEMAZEPAM 7.5 MG PO CAPS Oral Take by mouth at bedtime as needed. Unsure of dose    . DIVALPROEX SODIUM ER 500 MG PO TB24 Oral Take 1,000 mg by mouth 2 (two) times daily.      . QUETIAPINE FUMARATE 50 MG PO TABS Oral Take 50 mg by mouth 2 (two) times daily.      Marland Kitchen  RISPERIDONE 4 MG PO TABS Oral Take 4 mg by mouth daily.        BP 131/81  Pulse 88  Temp(Src) 97.7 F (36.5 C) (Oral)  Resp 18  SpO2 97%  Physical Exam  Nursing note and vitals reviewed. Constitutional: She is oriented to person, place, and time. She appears well-developed and well-nourished. No distress.  HENT:  Head: Normocephalic and atraumatic.  Neck: Normal range of motion. Neck supple.  Cardiovascular: Normal rate and regular rhythm.  Exam reveals no gallop and no friction rub.   No murmur heard. Pulmonary/Chest: Effort normal and breath sounds normal. No respiratory distress. She has no wheezes.  Abdominal: Soft. Bowel sounds are normal. She exhibits no distension. There is no tenderness.  Musculoskeletal: Normal range of motion.  Neurological: She is alert and oriented to person, place, and time.  Skin: Skin is warm and dry. She is not diaphoretic.    ED Course  Procedures (including critical care time)   Labs Reviewed  CBC  DIFFERENTIAL  COMPREHENSIVE METABOLIC PANEL  URINE RAPID DRUG SCREEN (HOSP PERFORMED)  ETHANOL   No results found.   No diagnosis found.    MDM  Labs positive for cocaine, negative  for alcohol.  Will consult act team and move to the psych ed pending placement by act.  Patient was accepted at [facility].          Geoffery Lyons, MD 04/02/11 [phone]

## 2011-04-02 NOTE — ED Notes (Signed)
WUJ:[id number]<JY> Expected date:04/02/11<BR> Expected time:10:08 AM<BR> Means of arrival:Ambulance<BR> Comments:<BR> EMS Detox, ETOH

## 2011-04-02 NOTE — ED Notes (Signed)
Patient is resting comfortably. 

## 2011-04-03 DIAGNOSIS — F1920 Other psychoactive substance dependence, uncomplicated: Secondary | ICD-10-CM | POA: Diagnosis present

## 2011-04-03 DIAGNOSIS — F99 Mental disorder, not otherwise specified: Secondary | ICD-10-CM | POA: Diagnosis present

## 2011-04-03 DIAGNOSIS — F29 Unspecified psychosis not due to a substance or known physiological condition: Secondary | ICD-10-CM

## 2011-04-03 LAB — URINALYSIS, ROUTINE W REFLEX MICROSCOPIC
Bilirubin Urine: NEGATIVE
Ketones, ur: NEGATIVE mg/dL
Nitrite: NEGATIVE
Specific Gravity, Urine: 1.02 (ref 1.005–1.030)
Urobilinogen, UA: 0.2 mg/dL (ref 0.0–1.0)

## 2011-04-03 MED ORDER — CLONIDINE HCL 0.1 MG PO TABS
0.1000 mg | ORAL_TABLET | Freq: Four times a day (QID) | ORAL | Status: DC
  Administered 2011-04-03 – 2011-04-04 (×3): 0.1 mg via ORAL
  Filled 2011-04-03 (×8): qty 1

## 2011-04-03 MED ORDER — METHOCARBAMOL 500 MG PO TABS: 500.0000 mg | ORAL_TABLET | Freq: Three times a day (TID) | ORAL | Status: DC | PRN

## 2011-04-03 MED ORDER — LOPERAMIDE HCL 2 MG PO CAPS: 2.0000 mg | ORAL_CAPSULE | ORAL | Status: DC | PRN

## 2011-04-03 MED ORDER — ONDANSETRON 4 MG PO TBDP: 4.0000 mg | ORAL_TABLET | Freq: Four times a day (QID) | ORAL | Status: DC | PRN

## 2011-04-03 MED ORDER — NAPROXEN 500 MG PO TABS: 500.0000 mg | ORAL_TABLET | Freq: Two times a day (BID) | ORAL | Status: DC | PRN

## 2011-04-03 MED ORDER — PROPRANOLOL HCL 40 MG PO TABS
40.0000 mg | ORAL_TABLET | Freq: Two times a day (BID) | ORAL | Status: DC
  Filled 2011-04-03 (×2): qty 1

## 2011-04-03 MED ORDER — CLONIDINE HCL 0.1 MG PO TABS: 0.1000 mg | ORAL_TABLET | Freq: Every day | ORAL | Status: DC

## 2011-04-03 MED ORDER — DICYCLOMINE HCL 20 MG PO TABS: 20.0000 mg | ORAL_TABLET | ORAL | Status: DC | PRN

## 2011-04-03 MED ORDER — HYDROXYZINE HCL 25 MG PO TABS: 25.0000 mg | ORAL_TABLET | Freq: Four times a day (QID) | ORAL | Status: DC | PRN

## 2011-04-03 MED ORDER — CLONIDINE HCL 0.1 MG PO TABS
0.1000 mg | ORAL_TABLET | ORAL | Status: DC
  Filled 2011-04-03 (×2): qty 1

## 2011-04-03 NOTE — Progress Notes (Signed)
Pt initially refused to have vital signs taken but then agreed to it.  BP remains high so Pt was agreeable to take her nighttime scheduled medications.  Pt had flat affect but was cooperative and polite during exchange.  Pt ate a snack and is hopeful for a restful night.  Support and encouragement offered.  Will continue to monitor.

## 2011-04-03 NOTE — Progress Notes (Signed)
Patient ID: [name], female   DOB: Nov 18, 1960, 51 y.o.   MRN: [id number]   Pt has been very flat and depressed on the unit. Pt has also been very drowsy, patient reported that she didn't want any more medication because it was making her worst. Pt started refusing all meds after lunch. Pt refused to self inventory sheet, no other issues or concerns noted. Pt reported that she was negative SI/HI, no AH/VH noted.

## 2011-04-03 NOTE — Progress Notes (Signed)
Suicide Risk Assessment  Admission Assessment     Demographic factors:  Assessment Details Time of Assessment: Admission Information Obtained From: Patient Current Mental Status:  Current Mental Status: Thoughts of violence towards others Loss Factors:  Loss Factors: Financial problems / change in socioeconomic status Historical Factors:  Historical Factors: Impulsivity Risk Reduction Factors:  Risk Reduction Factors: Sense of responsibility to family  CLINICAL FACTORS:   Alcohol/Substance Abuse/Dependencies Previous Psychiatric Diagnoses and Treatments  COGNITIVE FEATURES THAT CONTRIBUTE TO RISK:  Thought constriction (tunnel vision)    Diagnosis:  Axis I: Polysubstance Dependence including Alcohol and Cocaine. Reported History of a Psychotic Disorder - NOS currently stable.   The patient was seen today and reports the following:   ADL's: Intact  Sleep: The patient reports to sleeping reasonably well last night.  Appetite: The patient reports a good appetite today.   Mild>(1-10) >Severe  Hopelessness (1-10): 0  Depression (1-10): 0 Anxiety (1-10): 0   Suicidal Ideation: The patient adamantly denies any suicidal ideations today.  Plan: No  Intent: No  Means: No  Homicidal Ideation: The patient adamantly denies any homicidal ideations today.  Plan: No  Intent: No.  Means: No   General Appearance /Behavior: Casual and disshelved.  Eye Contact: Fair.  Speech: Appropriate in rate and volume but somewhat slurred.  Motor Behavior: Moderately Slowed.  Level of Consciousness: Somnolent but oriented x 3.  Mental Status: Somnolent but oriented x 3.  Mood: Essentially Euthymic.  Affect: Mildly constricted.  Anxiety Level: No anxiety reported.  Thought Process: wnl  Thought Content: The patient denies any auditory or visual hallucinations today as well as any delusional thinking.  Perception:. wnl.  Judgment: Fair.  Insight: Fair.  Cognition: Orientated time, place and  person.   The patient states that she is here for detox and treatment of her alcohol and cocaine dependence.  She reports a past history of "hearing voices" but states that she has not heard voices in some time.  Today the patient appears sedated but denies any symptoms of withdrawal.  Treatment Plan Summary:  1. Daily contact with patient to assess and evaluate symptoms and progress in treatment  2. Medication management  3. The patient will deny suicidal ideations or homicidal ideations for 48 hours prior to discharge and have a depression and anxiety rating of 3 or less. The patient will also deny any auditory or visual hallucinations or delusional thinking.  4. The patient will deny any symptoms of substance withdrawal at the time of discharge.  Plan:  1. Will continue current medications.  2. Will discontinue the medication Seroquel today since the patient is also taking 4 mgs of Risperdal and appears sedated today. 3. Will continue to monitor.   SUICIDE RISK:   Minimal: No identifiable suicidal ideation.  Patients presenting with no risk factors but with morbid ruminations; may be classified as minimal risk based on the severity of the depressive symptoms  [name] 04/03/2011, 1:37 PM

## 2011-04-03 NOTE — Progress Notes (Signed)
Pt has been in bed a good bit of the day, states that she has been drowsy from medication.  Pt has refused medication per day shift RN after noon because she felt that it was making her worse.  Will attempt to give Pt nighttime medication and see if she will be agreeable to take it.  Currently Pt has remained in bed with even and unlabored respirations.  Will continue to monitor.

## 2011-04-04 MED ORDER — LORAZEPAM 1 MG PO TABS: 2.0000 mg | ORAL_TABLET | Freq: Four times a day (QID) | ORAL | Status: DC | PRN

## 2011-04-04 NOTE — Progress Notes (Signed)
[facility] Group Notes:  (Counselor/Nursing/MHT/Case Management/Adjunct)  04/04/2011 1:49 PM   Type of Therapy:  Processing Group at 11:00 am  Participation Level:  Minimal   Participation Quality:   Intrusive to drowsy   Affect:  Irritable  Cognitive:  Confused   Insight:  None  Engagement in Group:   none  Engagement in Therapy:   None  Modes of Intervention: Socialization, limit setting and support   Summary of Progress/Problems:  [name] was diligent in repeatedly advocating for herself to be discharged yet became irritable when limits were set. Was ar times drowsy to the point of snoring. He only contribution to discussion of overcoming obstacles was "It is an obstacle for me to be in here"  [facility] Group Notes:  (Counselor/Nursing/MHT/Case Management/Adjunct)  04/04/2011 1:49 PM   Type of Therapy:  Counseling Group at 1:15 pm  Participation Level:  Minimal  Participation Quality:  Attentive   Affect:  Flat  Cognitive:  Alert   Engagement in Group:   limited  Modes of Intervention:   education   Summary of Progress/Problems:  [name] during brief description about local services offered by [facility]. Volunteer was a no-show; description of services was provided by Clinical research associate.   Ronda Fairly, LCSWA 04/04/2011 1:49 PM

## 2011-04-04 NOTE — Discharge Planning (Addendum)
Returning patient who presents as loopy, sedated, slurring words.  Says she is on too many meds.  Likely due to [facility] detox protocol.  Consumer with [facility] team.  Will contact them today to coordinate.  Dr will evaluate for possible transfer to [address] today. Per State Regulation 482.30 This chart was reviewed for medical necessity with respect to the patient's Admission/Duration of stay. Daryel Gerald, [place]  04/04/2011  Next Review Date:  04/07/2011

## 2011-04-04 NOTE — Progress Notes (Signed)
Pt is complaining that she does not need to be her. Pt is upset and angry. Pt seems to be a little off as she has tried to use the phone several times but it seems she does not know how to use it and is not dialing numbers. Pt does not go to groups. Pt was offered support and encouragement. Pt is not receptive to treatment. Pt safety is maintained.

## 2011-04-04 NOTE — [facility] Counselor (Signed)
Adult Comprehensive Assessment  Patient ID: [name], female   DOB: 1960/09/28, 51 y.o.   MRN: [id number]  Information Source:    Current Stressors:  Educational / Learning stressors: Not Applicable Employment / Job issues: Unemployeed; recieves SSI Family Relationships: Not Much Contact as per pt report Financial / Lack of resources (include bankruptcy): Strained Housing / Lack of housing: Lives in boarding house Physical health (include injuries & life threatening diseases): Depression, arthritus, history of seizures Social relationships: "okay" as per pt report Substance abuse: History of multiple admissions for detox Bereavement / Loss: Mother died 2 weeks previous  Living/Environment/Situation:  Living Arrangements: Other (Comment) [employer]) Living conditions (as described by patient or guardian): Described by patient as 'full of drugs' How long has patient lived in current situation?: On and off for 19 years What is atmosphere in current home: Chaotic  Family History:  Marital status: Divorced Divorced, when?: 1083 What types of issues is patient dealing with in the relationship?: Pt reports "I forget" Additional relationship information: Pt uninterested in discussing  Does patient have children?: Yes How many children?: 2  How is patient's relationship with their children?: they don't come to see me like they should  Childhood History:  By whom was/is the patient raised?: Mother Additional childhood history information: Father unknown Description of patient's relationship with caregiver when they were a child: okay Patient's description of current relationship with people who raised him/her: Mother recently died due to complications from alzhiemers; 2 weeks previopus Does patient have siblings?: Yes Number of Siblings: 6  Description of patient's current relationship with siblings: "we get along okay; but not much contact" Did patient suffer any  verbal/emotional/physical/sexual abuse as a child?: No Did patient suffer from severe childhood neglect?: No Has patient ever been sexually abused/assaulted/raped as an adolescent or adult?: No Was the patient ever a victim of a crime or a disaster?: No Witnessed domestic violence?: No Has patient been effected by domestic violence as an adult?: No  Education:  Highest grade of school patient has completed: 11th Currently a student?: No Learning disability?: No  Employment/Work Situation:   Employment situation: Unemployed Patient's job has been impacted by current illness: No What is the longest time patient has a held a job?:  1 year Where was the patient employed at that time?: unclear by patient's description; perhaps school crossing guard Has patient ever been in the [employer]?: No Has patient ever served in Buyer, retail?: No  Financial Resources:   Surveyor, quantity resources: Writer Does patient have a Lawyer or guardian?: Yes Name of representative payee or guardian: Trenton Gammon  Alcohol/Substance Abuse:   What has been your use of drugs/alcohol within the last 12 months?: At least once a week; more sometimes if I can get it If attempted suicide, did drugs/alcohol play a role in this?: No Alcohol/Substance Abuse Treatment Hx: Past detox If yes, describe treatment: Multiple [facility] Detox visists;  this visit is third in 15 months Has alcohol/substance abuse ever caused legal problems?:  (Pt unclear)  Social Support System:   Conservation officer, nature Support System: Poor Describe Community Support System: [facility] Type of faith/religion: Christain How does patient's faith help to cope with current illness?: NA  Leisure/Recreation:   Leisure and Hobbies: "what? tv"  Strengths/Needs:   What things does the patient do well?: "I don't know" In what areas does patient struggle / problems for patient: Money, friends  Discharge Plan:   Does patient have  access to transportation?:  No Plan for no access to transportation at discharge: Bus Will patient be returning to same living situation after discharge?: Yes Currently receiving community mental health services: Yes (From Whom) ([facility]) Does patient have financial barriers related to discharge medications?: No  Summary/Recommendations:   Summary and Recommendations (to be completed by the evaluator): Pt is 51 YO divorced [place] American female, admitted with diagnosis of Bopolar depressed. Pt unable to elaborate on many ot her answers yet wanted to complete PSA in timely fasion; pt admitted requesting detox as she has on multiple occassion; yet not requesting discharge which is also routine. Pt will benefit from crisis stabilization, medication evaluation, group therapy and psycho education in addition to case management for discharge planning.   Clide Dales. 04/04/2011

## 2011-04-04 NOTE — Progress Notes (Signed)
Patient ID: [name], female   DOB: 1960-04-20, 51 y.o.   MRN: [id number] Pt. Is irritable, says "when I'm going home", they say I can go home tomorrow.". Pt. Arguing about group "I don't like group, I don't want to go to group." Writer encouraged pt. To do so. Pt. Eventually went to group.Writer encouraged pt. To make plans for discharge and avoid friends that drink. Pt. Replied "I drink don't drink with nobody, I drink by myself". When asked what she plan to do after discharge when she has those craving pt. Say "gonna pray or something." Staff had to keep redirecting pt. So pull up pants. Pt. Denies SHI. Staff will continue to monitor q65min for safety.

## 2011-04-05 DIAGNOSIS — F1910 Other psychoactive substance abuse, uncomplicated: Secondary | ICD-10-CM

## 2011-04-05 MED ORDER — BENZTROPINE MESYLATE 0.5 MG PO TABS
0.5000 mg | ORAL_TABLET | ORAL | Status: DC
  Filled 2011-04-05 (×3): qty 28

## 2011-04-05 MED ORDER — DIVALPROEX SODIUM ER 500 MG PO TB24: 1000.0000 mg | ORAL_TABLET | Freq: Two times a day (BID) | ORAL | Status: DC

## 2011-04-05 MED ORDER — DIVALPROEX SODIUM ER 500 MG PO TB24
1000.0000 mg | ORAL_TABLET | ORAL | Status: DC
  Filled 2011-04-05 (×3): qty 56

## 2011-04-05 MED ORDER — RISPERIDONE 2 MG PO TABS
4.0000 mg | ORAL_TABLET | Freq: Every day | ORAL | Status: DC
  Filled 2011-04-05 (×2): qty 28

## 2011-04-05 NOTE — Treatment Plan (Signed)
Interdisciplinary Treatment Plan Update (Adult)  Date: 04/05/2011  Time Reviewed: 9:11 AM   Progress in Treatment: Attending groups: Yes Participating in groups: Yes Taking medication as prescribed: Yes Tolerating medication: Yes   Family/Significant other contact made:None listed, but contacted provider   Patient understands diagnosis:  Yes As evidenced by asking for help with depression, alcohol Discussing patient identified problems/goals with staff:  Yes  See below Medical problems stabilized or resolved:  Yes Denies suicidal/homicidal ideation: Yes Issues/concerns per patient self-inventory:  None noted Other:  New problem(s) identified: N/A  Reason for Continuation of Hospitalization: Other; describe D/C today  Interventions implemented related to continuation of hospitalization:   Additional comments:  Estimated length of stay:D/C today  Discharge Plan:Return home  Follow up [facility] ACT  New goal(s): N/A  Review of initial/current patient goals per problem list:   1.  Goal(s):Safely detox from alcohol  Met:  Yes  Target date:  As evidenced by:  2.  Goal (s):Medication adjustment based on symptoms  Met:  Yes  Target date:  As evidenced by:  3.  Goal(s):  Met:  Yes  Target date:  As evidenced by:  4.  Goal(s):  Met:  Yes  Target date:  As evidenced by:  Attendees: Patient:  [name] 04/05/2011 9:11 AM  Family:     Physician:  Lupe Carney 04/05/2011 9:11 AM   Nursing: Carolynn Comment   04/05/2011 9:11 AM   Case Manager:  Richelle Ito, LCSW 04/05/2011 9:11 AM   Counselor:  Ronda Fairly, LCSWA 04/05/2011 9:11 AM   Other: Shelda Jakes  04/05/2011 9:11 AM  Other:     Other:     Other:      Scribe for Treatment Team:   Ida Rogue, 04/05/2011 9:11 AM

## 2011-04-05 NOTE — Discharge Summary (Signed)
Elianny Buxbaum [birth year] 51 y.o.  [id number]     Date of Admission:  04/02/2011 Date of Discharge:  04/05/2011 Diagnosis: AXIS I Alcohol and Cocaine Dependence; Alcohol W/D; Psychotic Disorder NOS   AXIS II  deferred  AXIS III Past Medical History  Diagnosis Date  . Polysubstance abuse Alcohol abuse Schizophrenia Psychotic disorder Noncompliant with medication      Obesity    Poor dentition  AXIS IV  problems with access to care,   AXIS V   Moderate 55-60    HPI: Lockie was admitted to [facility] from ED where she presented after calling EMS stating that she was suicidal and was inebriated on alcohol and cocaine. She stated that she lived in a boarding house and that she no longer felt safe there due to all the alcohol and drugs there.  Venetta stated that her mother had passed away 2-3 weeks ago and she was despondent over this as well.    Hospital Course:      The duration of Tawonda"s stay at [facility] was unremarkable.      The patient was seen and evaluated by the Treatment team consisting of Psychiatrist, PAC, RN, Case Manager, and Therapist for evaluation and treatment plan with goal of stabilization upon discharge. The patient's physical and mental health problems were identified and treated appropriately.      Multiple modalities of treatment were used including medication, individual and group therapies, unit programming, AA/NA, improved nutrition, physical activity, and family sessions as needed.     The symptoms of alcohol/substance abuse withdrawal were monitored daily by serial clinical withdrawal scores. Improvement was demonstrated by declining CIWA/COWS numbers, improving vital signs, increased cognition, and improvement in mood, sleep, appetite as well as a reduction in psychosocial symptoms.       The patient was evaluated and found to be stable enough for discharge and was released to home per the initial plan of treatment.  She is encouraged to continue with her ACT team [facility].  Mental Status Exam:  For mental status exam please see mental status exam and  suicide risk assessment completed by attending physician prior to discharge. BP 146/95  Pulse 108  Temp(Src) 97.4 F (36.3 C) (Oral)  Resp 20  Ht 5\' 4"  (1.626 m)  Wt 220 lb (99.791 kg)  BMI 37.76 kg/m2  SpO2 97%  LMP 03/31/2011 Labs: Level of Care:  OP  Meds on Discharge: Medication List  As of 04/05/2011  1:41 PM   STOP taking these medications         QUEtiapine 50 MG tablet      risperidone 4 MG tablet         TAKE these medications         benztropine 0.5 MG tablet   Commonly known as: COGENTIN   Take by mouth 2 (two) times daily. Unsure of dose      divalproex 500 MG 24 hr tablet   Commonly known as: DEPAKOTE ER   Take 2 tablets (1,000 mg total) by mouth 2 (two) times daily. For mood stabilization.           Is patient on multiple antipsychotic therapies at discharge:  No   Has Patient had three or more failed trials of antipsychotic monotherapy by history:  No  Follow-up recommendations:  Other:  It is recommended that the Risperdal Consta be increased to avoid using PO medication as well. This will improve compliance and hopefully decrease any further risk of EPS.  Discharge destination:  Home Comments: Please keep all follow up appointments as scheduled. Rona Ravens. Kennidi Yoshida PAC for DR.Alyson Kuroski-MazzieMD 04/05/2011

## 2011-04-05 NOTE — Progress Notes (Signed)
[name]  51 y.o.  [id number] 1960/09/21  04/04/2011   Diagnosis:  Polysubstance abuse  Subjective: [name] was up and active on the hall, she was intrusive and interrupted several times during the day, with the same request to go home.  She was pleasant upon evaluation and she demonstrated no psychosis and denies SI/HI, but did have some possible EPS due to increased amounts of antipsychotic.    Objective:Vital Signs:Blood pressure 146/95, pulse 108, temperature 97.4 F (36.3 C), temperature source Oral, resp. rate 20, height 5\' 4"  (1.626 m), weight 220 lb (99.791 kg), last menstrual period 03/31/2011, SpO2 97.00%. Pt. In slight disheveled clothes, no appropriately closed, she is A&O x 3, No SI/HI, Denies AH/VH, speech is difficult to understand due to poor dentition.  Pt states medication she was given is causing her difficulty speaking, but she has poor speech by history.  Her lower lip quivers as well, not related to emotional changes. Mood is positive and upbeat and she is persistent in her desire to go home.  Assessment: Polysubstance abuse                         R/O EPS  Medications Scheduled:  . benztropine  0.5 mg Oral BH-qamhs  . chlordiazePOXIDE  25 mg Oral TID   Followed by  . chlordiazePOXIDE  25 mg Oral BH-qamhs   Followed by  . chlordiazePOXIDE  25 mg Oral Daily  . divalproex  1,000 mg Oral BH-qamhs  . mulitivitamin with minerals  1 tablet Oral Daily  . risperidone  4 mg Oral Daily  . thiamine  100 mg Intramuscular Once  . thiamine  100 mg Oral Daily  . DISCONTD: cloNIDine  0.1 mg Oral QID  . DISCONTD: cloNIDine  0.1 mg Oral BH-qamhs  . DISCONTD: cloNIDine  0.1 mg Oral QAC breakfast     PRN Meds dicyclomine, loperamide, LORazepam, methocarbamol, naproxen, ondansetron, sodium chloride, DISCONTD: chlordiazePOXIDE, DISCONTD: hydrOXYzine  Plan: Pt. Agrees to stay until the morning when she can meet with treatment team to follow up on her discharge and to continue to  observe for EPS. Continue current plan of care with no changes at this time.  Anticipated D/C is 24 hours.  Rona Ravens. Braedon Sjogren [facility] 04/05/2011 8:58 AM

## 2011-04-05 NOTE — Progress Notes (Signed)
Pt to be discharged today. Pt denies SI/HI. Pt's ACT team coming to pick up at 1300.

## 2011-04-05 NOTE — [facility] Suicide Risk Assessment (Signed)
Suicide Risk Assessment  Discharge Assessment      Demographic factors: See chart.    Current Mental Status: Patient seen and evaluated in treatment team. Chart reviewed. Patient stated that her mood was "fine". Her affect was mood congruent and euthymic.  She said her evening was "good".  She denied any current thoughts of self injurious behavior, suicidal ideation or homicidal ideation. She said that there were no auditory or visual hallucinations and she preferred the "shot" over pills for Tx.  No paranoia, delusional thought processes, or mania noted.  Thought process was linear and goal directed.  No psychomotor agitation or retardation was noted. Speech was normal rate, tone and volume. Eye contact was good. Judgment and insight are limited.  Patient has been up and engaged on the unit.  No acute safety concerns reported from team.  Loss Factors: Financial problems / change in socioeconomic status   Historical Factors: Impulsivity; med noncompliance; episodic SI reported per team; multiple past psychiatric admissions   Risk Reduction Factors: Sense of responsibility to family; f/u with ACT team   CLINICAL FACTORS:  Patient Active Problem List  Diagnoses  . Polysubstance abuse  . Chest pain  . Schizophrenia  . Polysubstance dependence  . Psychotic disorder   COGNITIVE FEATURES THAT CONTRIBUTE TO RISK:  Thought constriction (tunnel vision); limited insight   Diagnoses:  Axis I: Alcohol and Cocaine Dependence; Alcohol W/D; Psychotic Disorder NOS    SUICIDE RISK: Pt viewed as a chronic moderate increased risk of harm to self in light of her past hx and risk factors.  No acute safety concerns on the unit.  Pt contracting for safety and demanding discharge.  Vitals: Filed Vitals:   04/05/11 0735  BP: 146/95  Pulse: 108  Temp:   Resp:    Plan: Pt requesting discharge.  Pt not interested in completing Tx and detox protocol.  Was actually demanding to leave yesterday.  Pt not  invested in sobriety/SA Tx, limited insight.  Complications of potential continued w/d discussed with pt and team.  Nevertheless, pt is contracting for safety and does not currently meet [place] involuntary commitment criteria for continued hospitalization against her will.  Mental health treatment, medication management and continued sobriety will mitigate against the increased risk of harm to self and/or others.  Discussed the importance of recovery further with pt, as well as, tools to move forward in a healthy & safe manner.  Pt agreeable with the plan.  Discussed with the team.  Please see orders, follow up plans per team and full discharge summary completed by physician extender.  Follow up with ACT team, next IM Risperdal Consta injection due Friday.  Consider 400 hall if pt returns for Tx.   [name] 04/05/2011, 11:18 AM

## 2011-04-05 NOTE — Progress Notes (Signed)
[facility] Adult Inpatient Family/Significant Other Suicide Prevention Education  Suicide Prevention Education:  Patient Refusal for Family/Significant Other Suicide Prevention Education: The patient [name] has refused to provide written consent for family/significant other to be provided Family/Significant Other Suicide Prevention Education during admission and/or prior to discharge.  Writer provided suicide prevention education directly to patient; conversation included risk factors, warning signs and resources to contact for help. Mobile crisis services explained and contact card placed in chart for pt to receive at discharge. Writer also was present when patient needed assistance contacting [facility] and requested a ride from [facility] home.  Clide Dales 04/05/2011, 11:51 AM

## 2011-04-05 NOTE — Progress Notes (Signed)
[facility] Case Management Discharge Plan:  Will you be returning to the same living situation after discharge: Yes,  boarding house At discharge, do you have transportation home?:Yes,  ACT team Do you have the ability to pay for your medications:Yes,  ACT team  Interagency Information:     Release of information consent forms completed and in the chart;  Patient's signature needed at discharge.  Patient to Follow up at:  Follow-up Information    Follow up with [facility] ACT team on 04/05/2011. (Will pick you up today from hospital)    Contact information:   [address]  [place]  [phone]         Patient denies SI/HI:   Yes,  yes    Safety Planning and Suicide Prevention discussed:  Yes,  yes  Barrier to discharge identified:No.  Summary and Recommendations:   [name] 04/05/2011, 10:48 AM

## 2011-04-05 NOTE — Progress Notes (Signed)
[facility] Group Notes:  (Counselor/Nursing/MHT/Case Management/Adjunct)  04/05/2011 1:49 PM  Type of Therapy:  Processing Group with Counselor at 11:00AM  Participation Level:  Did Not Attend   [name] 04/05/2011, 1:49 PM

## 2011-04-06 NOTE — Progress Notes (Signed)
Patient Discharge Instructions:  Admission Note Faxed,  04/06/2011 After Visit Summary Faxed,  04/06/2011 Faxed to the Next Level Care provider:  04/06/2011 D/C Summary faxed 04/06/2011 Facesheet faxed 04/06/2011   Faxed to [facility] ACT @ [phone]  Wandra Scot, 04/06/2011, 3:41 PM

## 2011-05-01 ENCOUNTER — Encounter (HOSPITAL_COMMUNITY): Payer: Self-pay | Admitting: *Deleted

## 2011-05-01 ENCOUNTER — Inpatient Hospital Stay (HOSPITAL_COMMUNITY)
Admission: RE | Admit: 2011-05-01 | Discharge: 2011-05-09 | DRG: 897 | Disposition: A | Discharge status: Home / Self Care | Payer: Medicare Other | Source: Ambulatory Visit | Attending: Psychiatry | Admitting: Psychiatry

## 2011-05-01 ENCOUNTER — Encounter (HOSPITAL_COMMUNITY): Payer: Self-pay

## 2011-05-01 ENCOUNTER — Emergency Department (HOSPITAL_COMMUNITY)
Admission: EM | Admit: 2011-05-01 | Discharge: 2011-05-01 | Disposition: A | Discharge status: Other Acute Inpatient Hospital | Payer: Medicare Other | Source: Home / Self Care | Attending: Emergency Medicine | Admitting: Emergency Medicine

## 2011-05-01 DIAGNOSIS — F29 Unspecified psychosis not due to a substance or known physiological condition: Secondary | ICD-10-CM

## 2011-05-01 DIAGNOSIS — M129 Arthropathy, unspecified: Secondary | ICD-10-CM | POA: Insufficient documentation

## 2011-05-01 DIAGNOSIS — E669 Obesity, unspecified: Secondary | ICD-10-CM | POA: Insufficient documentation

## 2011-05-01 DIAGNOSIS — F319 Bipolar disorder, unspecified: Secondary | ICD-10-CM | POA: Insufficient documentation

## 2011-05-01 DIAGNOSIS — G40909 Epilepsy, unspecified, not intractable, without status epilepticus: Secondary | ICD-10-CM

## 2011-05-01 DIAGNOSIS — F1010 Alcohol abuse, uncomplicated: Secondary | ICD-10-CM

## 2011-05-01 DIAGNOSIS — F200 Paranoid schizophrenia: Secondary | ICD-10-CM

## 2011-05-01 DIAGNOSIS — F329 Major depressive disorder, single episode, unspecified: Secondary | ICD-10-CM

## 2011-05-01 DIAGNOSIS — Z8659 Personal history of other mental and behavioral disorders: Secondary | ICD-10-CM | POA: Insufficient documentation

## 2011-05-01 DIAGNOSIS — F17200 Nicotine dependence, unspecified, uncomplicated: Secondary | ICD-10-CM | POA: Insufficient documentation

## 2011-05-01 DIAGNOSIS — F1020 Alcohol dependence, uncomplicated: Principal | ICD-10-CM

## 2011-05-01 DIAGNOSIS — F209 Schizophrenia, unspecified: Secondary | ICD-10-CM

## 2011-05-01 DIAGNOSIS — F1920 Other psychoactive substance dependence, uncomplicated: Secondary | ICD-10-CM

## 2011-05-01 DIAGNOSIS — F1420 Cocaine dependence, uncomplicated: Secondary | ICD-10-CM

## 2011-05-01 DIAGNOSIS — Z79899 Other long term (current) drug therapy: Secondary | ICD-10-CM

## 2011-05-01 DIAGNOSIS — F1410 Cocaine abuse, uncomplicated: Secondary | ICD-10-CM

## 2011-05-01 DIAGNOSIS — Z818 Family history of other mental and behavioral disorders: Secondary | ICD-10-CM

## 2011-05-01 DIAGNOSIS — F410 Panic disorder [episodic paroxysmal anxiety] without agoraphobia: Secondary | ICD-10-CM

## 2011-05-01 DIAGNOSIS — F1994 Other psychoactive substance use, unspecified with psychoactive substance-induced mood disorder: Secondary | ICD-10-CM

## 2011-05-01 DIAGNOSIS — F1910 Other psychoactive substance abuse, uncomplicated: Secondary | ICD-10-CM | POA: Diagnosis present

## 2011-05-01 LAB — CBC
HCT: 39.4 % (ref 36.0–46.0)
Hemoglobin: 12.9 g/dL (ref 12.0–15.0)
MCHC: 32.7 g/dL (ref 30.0–36.0)
RBC: 4.44 MIL/uL (ref 3.87–5.11)
WBC: 4.7 10*3/uL (ref 4.0–10.5)

## 2011-05-01 LAB — BASIC METABOLIC PANEL
BUN: 12 mg/dL (ref 6–23)
CO2: 28 mEq/L (ref 19–32)
Calcium: 9.1 mg/dL (ref 8.4–10.5)
Chloride: 104 mEq/L (ref 96–112)
Creatinine, Ser: 0.85 mg/dL (ref 0.50–1.10)
GFR calc Af Amer: >90 mL/min (ref >90)
GFR calc non Af Amer: 79 mL/min — ABNORMAL LOW (ref >90)
Glucose, Bld: 106 mg/dL — ABNORMAL HIGH (ref 70–99)
Potassium: 4 mEq/L (ref 3.5–5.1)
Sodium: 139 mEq/L (ref 135–145)

## 2011-05-01 LAB — RAPID URINE DRUG SCREEN, HOSP PERFORMED
Amphetamines: NOT DETECTED
Barbiturates: NOT DETECTED
Benzodiazepines: POSITIVE — AB
Cocaine: POSITIVE — AB

## 2011-05-01 LAB — ETHANOL: Alcohol, Ethyl (B): 15 mg/dL — ABNORMAL HIGH (ref 0–11)

## 2011-05-01 MED ORDER — ZOLPIDEM TARTRATE 5 MG PO TABS: 5.0000 mg | ORAL_TABLET | Freq: Every evening | ORAL | Status: DC | PRN

## 2011-05-01 MED ORDER — QUETIAPINE FUMARATE 50 MG PO TABS: 50.0000 mg | ORAL_TABLET | Freq: Two times a day (BID) | ORAL | Status: DC

## 2011-05-01 MED ORDER — RISPERIDONE 2 MG PO TABS
4.0000 mg | ORAL_TABLET | Freq: Every day | ORAL | Status: DC
  Administered 2011-05-01: 4 mg via ORAL
  Filled 2011-05-01: qty 2

## 2011-05-01 MED ORDER — CHLORDIAZEPOXIDE HCL 25 MG PO CAPS: 25.0000 mg | ORAL_CAPSULE | Freq: Every day | ORAL | Status: DC

## 2011-05-01 MED ORDER — CHLORDIAZEPOXIDE HCL 25 MG PO CAPS: 25.0000 mg | ORAL_CAPSULE | Freq: Three times a day (TID) | ORAL | Status: DC

## 2011-05-01 MED ORDER — BENZTROPINE MESYLATE 1 MG PO TABS: 0.5000 mg | ORAL_TABLET | Freq: Two times a day (BID) | ORAL | Status: DC

## 2011-05-01 MED ORDER — ACETAMINOPHEN 325 MG PO TABS
650.0000 mg | ORAL_TABLET | Freq: Four times a day (QID) | ORAL | Status: DC | PRN
  Administered 2011-05-02 – 2011-05-08 (×8): 650 mg via ORAL

## 2011-05-01 MED ORDER — ACETAMINOPHEN 325 MG PO TABS
650.0000 mg | ORAL_TABLET | ORAL | Status: DC | PRN
  Administered 2011-05-01: 650 mg via ORAL
  Filled 2011-05-01: qty 2

## 2011-05-01 MED ORDER — CHLORDIAZEPOXIDE HCL 25 MG PO CAPS: 25.0000 mg | ORAL_CAPSULE | Freq: Four times a day (QID) | ORAL | Status: AC | PRN

## 2011-05-01 MED ORDER — CHLORDIAZEPOXIDE HCL 25 MG PO CAPS
25.0000 mg | ORAL_CAPSULE | Freq: Four times a day (QID) | ORAL | Status: DC
  Administered 2011-05-02 (×3): 25 mg via ORAL
  Filled 2011-05-01 (×3): qty 1

## 2011-05-01 MED ORDER — LOPERAMIDE HCL 2 MG PO CAPS: 2.0000 mg | ORAL_CAPSULE | ORAL | Status: AC | PRN

## 2011-05-01 MED ORDER — TRAZODONE HCL 50 MG PO TABS: 50.0000 mg | ORAL_TABLET | Freq: Every evening | ORAL | Status: DC | PRN

## 2011-05-01 MED ORDER — LORAZEPAM 1 MG PO TABS: 1.0000 mg | ORAL_TABLET | Freq: Three times a day (TID) | ORAL | Status: DC | PRN

## 2011-05-01 MED ORDER — RISPERIDONE 2 MG PO TABS
4.0000 mg | ORAL_TABLET | Freq: Every day | ORAL | Status: DC
  Administered 2011-05-02 – 2011-05-04 (×3): 4 mg via ORAL
  Filled 2011-05-01 (×5): qty 2

## 2011-05-01 MED ORDER — BENZTROPINE MESYLATE 1 MG PO TABS: 1.0000 mg | ORAL_TABLET | Freq: Two times a day (BID) | ORAL | Status: DC

## 2011-05-01 MED ORDER — CHLORDIAZEPOXIDE HCL 25 MG PO CAPS: 25.0000 mg | ORAL_CAPSULE | ORAL | Status: DC

## 2011-05-01 MED ORDER — BENZTROPINE MESYLATE 0.5 MG PO TABS
0.5000 mg | ORAL_TABLET | Freq: Two times a day (BID) | ORAL | Status: DC
  Administered 2011-05-02 – 2011-05-09 (×15): 0.5 mg via ORAL
  Filled 2011-05-01 (×21): qty 1

## 2011-05-01 MED ORDER — HYDROXYZINE HCL 25 MG PO TABS: 25.0000 mg | ORAL_TABLET | Freq: Four times a day (QID) | ORAL | Status: AC | PRN

## 2011-05-01 MED ORDER — ONDANSETRON 4 MG PO TBDP: 4.0000 mg | ORAL_TABLET | Freq: Four times a day (QID) | ORAL | Status: AC | PRN

## 2011-05-01 MED ORDER — DIVALPROEX SODIUM ER 500 MG PO TB24
1000.0000 mg | ORAL_TABLET | Freq: Two times a day (BID) | ORAL | Status: DC
  Filled 2011-05-01 (×3): qty 2

## 2011-05-01 MED ORDER — VITAMIN B-1 100 MG PO TABS
100.0000 mg | ORAL_TABLET | Freq: Every day | ORAL | Status: DC
  Administered 2011-05-02 – 2011-05-09 (×7): 100 mg via ORAL
  Filled 2011-05-01 (×10): qty 1

## 2011-05-01 MED ORDER — ONDANSETRON HCL 4 MG PO TABS: 4.0000 mg | ORAL_TABLET | Freq: Three times a day (TID) | ORAL | Status: DC | PRN

## 2011-05-01 MED ORDER — ADULT MULTIVITAMIN W/MINERALS CH
1.0000 | ORAL_TABLET | Freq: Every day | ORAL | Status: DC
  Administered 2011-05-02 – 2011-05-09 (×8): 1 via ORAL
  Filled 2011-05-01 (×10): qty 1

## 2011-05-01 MED ORDER — ALUM & MAG HYDROXIDE-SIMETH 200-200-20 MG/5ML PO SUSP: 30.0000 mL | ORAL | Status: DC | PRN

## 2011-05-01 MED ORDER — QUETIAPINE FUMARATE 50 MG PO TABS
50.0000 mg | ORAL_TABLET | Freq: Two times a day (BID) | ORAL | Status: DC
  Administered 2011-05-02 – 2011-05-05 (×8): 50 mg via ORAL
  Filled 2011-05-01 (×12): qty 1

## 2011-05-01 MED ORDER — NICOTINE 21 MG/24HR TD PT24
21.0000 mg | MEDICATED_PATCH | Freq: Every day | TRANSDERMAL | Status: DC
  Administered 2011-05-01: 21 mg via TRANSDERMAL
  Filled 2011-05-01: qty 1

## 2011-05-01 MED ORDER — THIAMINE HCL 100 MG/ML IJ SOLN
100.0000 mg | Freq: Once | INTRAMUSCULAR | Status: AC
  Administered 2011-05-02: 100 mg via INTRAMUSCULAR

## 2011-05-01 MED ORDER — IBUPROFEN 600 MG PO TABS: 600.0000 mg | ORAL_TABLET | Freq: Three times a day (TID) | ORAL | Status: DC | PRN

## 2011-05-01 MED ORDER — MAGNESIUM HYDROXIDE 400 MG/5ML PO SUSP: 30.0000 mL | Freq: Every day | ORAL | Status: DC | PRN

## 2011-05-01 MED ORDER — DIVALPROEX SODIUM ER 500 MG PO TB24
1000.0000 mg | ORAL_TABLET | Freq: Two times a day (BID) | ORAL | Status: DC
  Administered 2011-05-02 – 2011-05-05 (×8): 1000 mg via ORAL
  Filled 2011-05-01 (×12): qty 2

## 2011-05-01 MED ORDER — NICOTINE 21 MG/24HR TD PT24
21.0000 mg | MEDICATED_PATCH | Freq: Every day | TRANSDERMAL | Status: DC
  Administered 2011-05-02 – 2011-05-09 (×5): 21 mg via TRANSDERMAL
  Filled 2011-05-01 (×10): qty 1

## 2011-05-01 NOTE — Discharge Instructions (Signed)
Go directly to behavioral health Hospital for psychiatric treatment and detox.

## 2011-05-01 NOTE — ED Notes (Signed)
Patient here for medical clearance and wanting detox from crack and etoh use. Last used crack last week and etoh use this am. Patient was here 2 weeks for same.

## 2011-05-01 NOTE — ED Notes (Signed)
Lab into draw additional blood work

## 2011-05-01 NOTE — ED Provider Notes (Addendum)
History     CSN: [id number]  Arrival date & time 05/01/11  1339   First MD Initiated Contact with Patient 05/01/11 1615      Chief Complaint  Patient presents with  . Medical Clearance    (Consider location/radiation/quality/duration/timing/severity/associated sxs/prior treatment) The history is provided by the patient.   51 year old female presents requesting detox from alcohol and cocaine. She last drank alcohol this morning stating she drank a whole bottle of liquor visualized used cocaine last night. She has a history of bipolar disorder but states that that is under control. She is depressed with some difficulty sleeping, but no crying spells or anhedonia. She denies homicidal or suicidal ideation, but she states that she is worried that drug dealers or people high on crack will tell her if she's not admitted. She denies hallucinations.  Past Medical History  Diagnosis Date  . Arthritis   . Seizures   . Psychiatric illness   . Bipolar 1 disorder   . Schizo-affective schizophrenia   . Obesity     Past Surgical History  Procedure Date  . Stomach surgery     No family history on file.  History  Substance Use Topics  . Smoking status: Current Everyday Smoker -- 1.0 packs/day    Types: Cigarettes  . Smokeless tobacco: Not on file  . Alcohol Use: 6.0 oz/week    4 Glasses of wine, 6 Cans of beer per week    OB History    Grav Para Term Preterm Abortions TAB SAB Ect Mult Living                  Review of Systems  All other systems reviewed and are negative.    Allergies  Review of patient's allergies indicates no known allergies.  Home Medications   Current Outpatient Rx  Name Route Sig Dispense Refill  . DIVALPROEX SODIUM ER 500 MG PO TB24 Oral Take 2 tablets (1,000 mg total) by mouth 2 (two) times daily. For mood stabilization.    . QUETIAPINE FUMARATE 50 MG PO TABS Oral Take 50 mg by mouth 2 (two) times daily.    Marland Kitchen RISPERIDONE 4 MG PO TABS Oral Take 4  mg by mouth daily.    Marland Kitchen BENZTROPINE MESYLATE 0.5 MG PO TABS Oral Take by mouth 2 (two) times daily. Unsure of dose      BP 126/88  Pulse 108  Temp(Src) 98.4 F (36.9 C) (Oral)  Resp 16  Ht 5\' 5"  (1.651 m)  Wt 210 lb (95.255 kg)  BMI 34.95 kg/m2  SpO2 97%  LMP 03/31/2011  Physical Exam  Nursing note and vitals reviewed.  51 year old female is resting comfortably and in no acute distress. Vital signs are significant for mild tachycardia very 108. Oxygen saturation is 97% which is normal. Head is normocephalic and atraumatic. PERRLA, EOMI. Oropharynx is clear. Neck is nontender and supple. Back is nontender. Lungs are clear without rales, wheezes, rhonchi. Heart has regular rate and rhythm without murmur. Abdomen soft, flat, nontender without masses or hepatosplenomegaly. Extremities have no cyanosis or edema, full range of motion is present. Skin is warm and dry without rash. Neurologic: Mental status is normal, cranial nerves are intact, there no focal motor or sensory deficits.  ED Course  Procedures (including critical care time)  Results for orders placed during the hospital encounter of 05/01/11  CBC      Component Value Range   WBC 4.7  4.0 - 10.5 (K/uL)   RBC 4.44  3.87 - 5.11 (MIL/uL)   Hemoglobin 12.9  12.0 - 15.0 (g/dL)   HCT 16.1  09.6 - 04.5 (%)   MCV 88.7  78.0 - 100.0 (fL)   MCH 29.1  26.0 - 34.0 (pg)   MCHC 32.7  30.0 - 36.0 (g/dL)   RDW 40.9  81.1 - 91.4 (%)   Platelets 268  150 - 400 (K/uL)  BASIC METABOLIC PANEL      Component Value Range   Sodium 139  135 - 145 (mEq/L)   Potassium 4.0  3.5 - 5.1 (mEq/L)   Chloride 104  96 - 112 (mEq/L)   CO2 28  19 - 32 (mEq/L)   Glucose, Bld 106 (*) 70 - 99 (mg/dL)   BUN 12  6 - 23 (mg/dL)   Creatinine, Ser 7.82  0.50 - 1.10 (mg/dL)   Calcium 9.1  8.4 - 95.6 (mg/dL)   GFR calc non Af Amer 79 (*) >90 (mL/min)   GFR calc Af Amer >90  >90 (mL/min)  URINE RAPID DRUG SCREEN (HOSP PERFORMED)      Component Value Range    Opiates NONE DETECTED  NONE DETECTED    Cocaine POSITIVE (*) NONE DETECTED    Benzodiazepines POSITIVE (*) NONE DETECTED    Amphetamines NONE DETECTED  NONE DETECTED    Tetrahydrocannabinol NONE DETECTED  NONE DETECTED    Barbiturates NONE DETECTED  NONE DETECTED   ETHANOL      Component Value Range   Alcohol, Ethyl (B) 15 (*) 0 - 11 (mg/dL)  VALPROIC ACID LEVEL      Component Value Range   Valproic Acid Lvl 71.4  50.0 - 100.0 (ug/mL)   2130: Patient has been accepted at [facility] by Dr. Elmon Kirschner. She will be transferred there for psychiatric treatment.    1. Alcohol abuse   2. Cocaine abuse   3. Depression       MDM  Alcohol and crack cocaine abuse. Bipolar disorder which appears to be relatively well controlled with the exception of drug abuse. Prior records are reviewed, and she had been admitted to [facility] about 4 weeks ago for an episode of depression        Dione Booze, MD 05/01/11 2004  Dione Booze, MD 05/01/11 2131
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
## 2011-05-01 NOTE — ED Notes (Signed)
Dr glick into see 

## 2011-05-01 NOTE — ED Notes (Signed)
Pt admitted to psych ED from triage requesting detox from ETOH and cocaine. Drinks daily two 40 oz beer plus wine and whatever else she can get. Smokes crack daily. States she has been living at a crack house and wants to live somewhere else. She denies SI/HI. Endorses mild AH at times but can't remember what they say. Denies VH. Mood is pleasant. Affect is congruent. Behavior cooperative. Unit policies reviewed. Pt verbalizes understanding. Will cont. To monitor.

## 2011-05-01 NOTE — BH Assessment (Signed)
Pt has been accepted to [facility]. Accepting MD is Jorje Guild to [facility], bed 506-1. Pt's RN notified. Support paperwork completed and faxed to [facility].

## 2011-05-01 NOTE — ED Notes (Signed)
2 belonging bags in locker number 39

## 2011-05-01 NOTE — ED Notes (Signed)
Up to the bathroom 

## 2011-05-02 DIAGNOSIS — F1994 Other psychoactive substance use, unspecified with psychoactive substance-induced mood disorder: Secondary | ICD-10-CM

## 2011-05-02 DIAGNOSIS — F1910 Other psychoactive substance abuse, uncomplicated: Secondary | ICD-10-CM

## 2011-05-02 DIAGNOSIS — Z8659 Personal history of other mental and behavioral disorders: Secondary | ICD-10-CM

## 2011-05-02 MED ORDER — CHLORDIAZEPOXIDE HCL 25 MG PO CAPS
25.0000 mg | ORAL_CAPSULE | Freq: Four times a day (QID) | ORAL | Status: AC
  Administered 2011-05-02 (×2): 25 mg via ORAL
  Filled 2011-05-02 (×2): qty 1

## 2011-05-02 MED ORDER — CHLORDIAZEPOXIDE HCL 25 MG PO CAPS
25.0000 mg | ORAL_CAPSULE | Freq: Three times a day (TID) | ORAL | Status: AC
  Administered 2011-05-03 (×3): 25 mg via ORAL
  Filled 2011-05-02 (×3): qty 1

## 2011-05-02 MED ORDER — CHLORDIAZEPOXIDE HCL 25 MG PO CAPS
25.0000 mg | ORAL_CAPSULE | Freq: Every day | ORAL | Status: AC
  Administered 2011-05-05: 25 mg via ORAL
  Filled 2011-05-02: qty 1

## 2011-05-02 MED ORDER — CHLORDIAZEPOXIDE HCL 25 MG PO CAPS
25.0000 mg | ORAL_CAPSULE | ORAL | Status: AC
  Administered 2011-05-04 (×2): 25 mg via ORAL
  Filled 2011-05-02 (×2): qty 1

## 2011-05-02 NOTE — Progress Notes (Signed)
Adult Psychosocial Assessment Update Interdisciplinary Team  Previous [facility] Health Hospital admissions/discharges:  Admissions Discharges  Date:    04/02/11 Date:    04/05/11  Date:    11/17/10 Date:    11/24/10  Date:    08/01/10 Date:    08/05/10  Date: Date:  Date: Date:   Changes since the last Psychosocial Assessment (including adherence to outpatient mental health and/or substance abuse treatment, situational issues contributing to decompensation and/or relapse). Demaris has been living at a boarding house and using both alcohol and crack daily. She   Calls the place she lives a "crack house" and states that she cannot live there without  Using because there is so much drinking and drugging around her. Meggan is still grieving  The loss of her mother in December, and also her daughter's incarceration. She is also  Very concerned about her son's drug use.  Rondell reports she is not suicidal, but would   Probably die of an accidental overdose without treatment. She states she hears some voices but cannot tell what they are saying.   Discharge Plan 1. Will you be returning to the same living situation after discharge?   Yes: No:   X    If no, what is your plan?  Bill does not know her plan yet.     Gearline can return to the boarding house where she has been living but does not want to  Due to the drug use there. She is not considering further treatment at this time.      2. Would you like a referral for services when you are discharged? Yes:     If yes, for what services?  No:   X    Receives services from [facility]       Summary and Recommendations (to be completed by the evaluator) Niomie is a 51 year old female diagnosed with Polysubstance Dependence and Psychotic   Disorder NOS. She reports that she has been in an irritable mood lately that she cannot   Seem to change, enjoys nothing but getting high, and cannot sleep. She reports that it is  Impossible to remain sober  where she is living, and that her daily alcohol and crack   Cocaine use is a result of living there. Mikeyla would benefit from crisis stabilization,   Medication evaluation, therapy groups for processing thoughts, feelings and experiences,  psychoed groups for coping skills and case management for discharge planning.            Signature:  Billie Lade, 05/02/2011 8:38 AM

## 2011-05-02 NOTE — Discharge Planning (Signed)
Patient not seen today by Case Manager.  Chart reviewed for insurance purposes only.  Per State Regulation 482.30  This chart was reviewed for medical necessity with respect to the patient's Admission/Duration of stay.   Next review due:  05/05/11   Ambrose Mantle, LCSW  05/02/2011  3:42 PM

## 2011-05-02 NOTE — Progress Notes (Signed)
[facility] Group Notes:  (Counselor/Nursing/MHT/Case Management/Adjunct) 1:15pm   Type of Therapy:  Group Therapy  Participation Level:  Did Not Attend       [name], [name] 05/02/2011  2:07 PM   

## 2011-05-02 NOTE — H&P (Signed)
Medical/psychiatric screening examination/treatment/procedure(s) were performed by non-physician practitioner and as supervising physician I was immediately available for consultation/collaboration.   I have seen and examined this patient and agree with this evaluation.  

## 2011-05-02 NOTE — Progress Notes (Signed)
Patient ID: [name], female   DOB: 1960/04/20, 51 y.o.   MRN: [id number] Patient's self inventory sheet, patient sleeps fair, has good appetite, low energy level, poor attention span.   Rated depression and hopelessness #10.  Denied SI.  After discharge, patient stated she will not use drugs again.   Wants to go to rehab program after discharge.   Not sure of her discharge plans.  No problems taking meds after discharge.
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
## 2011-05-02 NOTE — [facility] Suicide Risk Assessment (Signed)
Suicide Risk Assessment  Admission Assessment     Demographic factors:  Assessment Details Time of Assessment: Admission Information Obtained From: Patient Current Mental Status:  Current Mental Status: Self-harm behaviors Loss Factors:  Loss Factors: Decline in physical health;Financial problems / change in socioeconomic status Historical Factors:  Historical Factors: Family history of mental illness or substance abuse;Impulsivity Risk Reduction Factors:  Risk Reduction Factors: Sense of responsibility to family  CLINICAL FACTORS:   Severe Anxiety and/or Agitation Alcohol/Substance Abuse/Dependencies Epilepsy Chronic Pain Previous Psychiatric Diagnoses and Treatments Medical Diagnoses and Treatments/Surgeries  COGNITIVE FEATURES THAT CONTRIBUTE TO RISK:  Closed-mindedness Thought constriction (tunnel vision)    SUICIDE RISK:   Mild:  Suicidal ideation of limited frequency, intensity, duration, and specificity.  There are no identifiable plans, no associated intent, mild dysphoria and related symptoms, good self-control (both objective and subjective assessment), few other risk factors, and identifiable protective factors, including available and accessible social support.  Reason for hospitalization: .Wants detox and thinks she wants rehab this time.  Diagnosis:  Axis I: Substance Abuse and Substance Induced Mood Disorder  ADL's:  Impaired  Sleep: Fair  Appetite:  Fair  Suicidal Ideation:  Denies current SI Homicidal Ideation:  Denies adamantly any homicidal thoughts.  Mental Status Examination/Evaluation: Objective:  Appearance: Disheveled  Eye Contact::  Fair  Speech:  Garbled  Volume:  Increased  Mood:  8 /10 on a scale of 1 is the best and 10 is the worst  Anxiety: 7/10 on the same scale  Affect:  Blunt  Thought Process:  Coherent  Orientation:  Full  Thought Content:  WDL  Suicidal Thoughts:  No  Homicidal Thoughts:  No  Memory:  Immediate;   Fair    Judgement:  Impaired  Insight:  Lacking  Psychomotor Activity:  Normal  Concentration:  Fair  Recall:  Fair  Akathisia:  No  AIMS (if indicated):     Assets:  Communication Skills Desire for Improvement  Sleep:       Vital Signs: Blood pressure 137/85, pulse 88, temperature 97.7 F (36.5 C), resp. rate 20, last menstrual period 03/31/2011. Current Medications:  Current Facility-Administered Medications  Medication Dose Route Frequency Provider Last Rate Last Dose  . acetaminophen (TYLENOL) tablet 650 mg  650 mg Oral Q6H PRN Jorje Guild, PA      . alum & mag hydroxide-simeth (MAALOX/MYLANTA) 200-200-20 MG/5ML suspension 30 mL  30 mL Oral Q4H PRN Jorje Guild, PA      . benztropine (COGENTIN) tablet 0.5 mg  0.5 mg Oral BID Jorje Guild, PA   0.5 mg at 05/02/11 0751  . chlordiazePOXIDE (LIBRIUM) capsule 25 mg  25 mg Oral Q6H PRN Jorje Guild, PA      . chlordiazePOXIDE (LIBRIUM) capsule 25 mg  25 mg Oral QID Orson Aloe, MD       Followed by  . chlordiazePOXIDE (LIBRIUM) capsule 25 mg  25 mg Oral TID Orson Aloe, MD       Followed by  . chlordiazePOXIDE (LIBRIUM) capsule 25 mg  25 mg Oral BH-qamhs Orson Aloe, MD       Followed by  . chlordiazePOXIDE (LIBRIUM) capsule 25 mg  25 mg Oral Daily Orson Aloe, MD      . divalproex (DEPAKOTE ER) 24 hr tablet 1,000 mg  1,000 mg Oral BID Jorje Guild, PA   1,000 mg at 05/02/11 1610  . hydrOXYzine (ATARAX/VISTARIL) tablet 25 mg  25 mg Oral Q6H PRN Jorje Guild, PA      .  loperamide (IMODIUM) capsule 2-4 mg  2-4 mg Oral PRN Jorje Guild, PA      . magnesium hydroxide (MILK OF MAGNESIA) suspension 30 mL  30 mL Oral Daily PRN Jorje Guild, PA      . mulitivitamin with minerals tablet 1 tablet  1 tablet Oral Daily Jorje Guild, [place]   1 tablet at 05/02/11 0753  . nicotine (NICODERM CQ - dosed in mg/24 hours) patch 21 mg  21 mg Transdermal Q0600 Jorje Guild, PA   21 mg at 05/02/11 1610  . ondansetron (ZOFRAN-ODT) disintegrating tablet 4 mg  4 mg Oral Q6H PRN Jorje Guild, PA       . QUEtiapine (SEROQUEL) tablet 50 mg  50 mg Oral BID Jorje Guild, PA   50 mg at 05/02/11 0753  . risperiDONE (RISPERDAL) tablet 4 mg  4 mg Oral QHS Jorje Guild, PA      . thiamine (B-1) injection 100 mg  100 mg Intramuscular Once Jorje Guild, PA   100 mg at 05/02/11 0035  . thiamine (VITAMIN B-1) tablet 100 mg  100 mg Oral Daily Jorje Guild, PA   100 mg at 05/02/11 0753  . traZODone (DESYREL) tablet 50 mg  50 mg Oral QHS PRN Jorje Guild, PA      . DISCONTD: chlordiazePOXIDE (LIBRIUM) capsule 25 mg  25 mg Oral QID Jorje Guild, PA   25 mg at 05/02/11 1245  . DISCONTD: chlordiazePOXIDE (LIBRIUM) capsule 25 mg  25 mg Oral TID Jorje Guild, PA      . DISCONTD: chlordiazePOXIDE (LIBRIUM) capsule 25 mg  25 mg Oral BH-qamhs Jorje Guild, [place]      . DISCONTD: chlordiazePOXIDE (LIBRIUM) capsule 25 mg  25 mg Oral Daily Jorje Guild, PA       Facility-Administered Medications Ordered in Other Encounters  Medication Dose Route Frequency Provider Last Rate Last Dose  . DISCONTD: acetaminophen (TYLENOL) tablet 650 mg  650 mg Oral Q4H PRN Dione Booze, MD   650 mg at 05/01/11 1954  . DISCONTD: alum & mag hydroxide-simeth (MAALOX/MYLANTA) 200-200-20 MG/5ML suspension 30 mL  30 mL Oral PRN Dione Booze, MD      . DISCONTD: benztropine (COGENTIN) tablet 0.5 mg  0.5 mg Oral BID Dione Booze, MD      . DISCONTD: benztropine (COGENTIN) tablet 1 mg  1 mg Oral BID Dione Booze, MD      . DISCONTD: divalproex (DEPAKOTE ER) 24 hr tablet 1,000 mg  1,000 mg Oral BID Dione Booze, MD      . DISCONTD: ibuprofen (ADVIL,MOTRIN) tablet 600 mg  600 mg Oral Q8H PRN Dione Booze, MD      . DISCONTD: LORazepam (ATIVAN) tablet 1 mg  1 mg Oral Q8H PRN Dione Booze, MD      . DISCONTD: nicotine (NICODERM CQ - dosed in mg/24 hours) patch 21 mg  21 mg Transdermal Daily Dione Booze, MD   21 mg at 05/01/11 1702  . DISCONTD: ondansetron (ZOFRAN) tablet 4 mg  4 mg Oral Q8H PRN Dione Booze, MD      . DISCONTD: QUEtiapine (SEROQUEL) tablet 50 mg  50 mg Oral BID Dione Booze, MD      . DISCONTD: risperiDONE (RISPERDAL) tablet 4 mg  4 mg Oral Daily Dione Booze, MD   4 mg at 05/01/11 1702  . DISCONTD: zolpidem (AMBIEN) tablet 5 mg  5 mg Oral QHS PRN Dione Booze, MD        Lab Results:  Results for orders placed during  the hospital encounter of 05/01/11 (from the past 48 hour(s))  URINE RAPID DRUG SCREEN (HOSP PERFORMED)     Status: Abnormal   Collection Time   05/01/11  2:21 PM      Component Value Range Comment   Opiates NONE DETECTED  NONE DETECTED     Cocaine POSITIVE (*) NONE DETECTED     Benzodiazepines POSITIVE (*) NONE DETECTED     Amphetamines NONE DETECTED  NONE DETECTED     Tetrahydrocannabinol NONE DETECTED  NONE DETECTED     Barbiturates NONE DETECTED  NONE DETECTED    CBC     Status: Normal   Collection Time   05/01/11  2:25 PM      Component Value Range Comment   WBC 4.7  4.0 - 10.5 (K/uL)    RBC 4.44  3.87 - 5.11 (MIL/uL)    Hemoglobin 12.9  12.0 - 15.0 (g/dL)    HCT 40.9  81.1 - 91.4 (%)    MCV 88.7  78.0 - 100.0 (fL)    MCH 29.1  26.0 - 34.0 (pg)    MCHC 32.7  30.0 - 36.0 (g/dL)    RDW 78.2  95.6 - 21.3 (%)    Platelets 268  150 - 400 (K/uL)   BASIC METABOLIC PANEL     Status: Abnormal   Collection Time   05/01/11  2:25 PM      Component Value Range Comment   Sodium 139  135 - 145 (mEq/L)    Potassium 4.0  3.5 - 5.1 (mEq/L)    Chloride 104  96 - 112 (mEq/L)    CO2 28  19 - 32 (mEq/L)    Glucose, Bld 106 (*) 70 - 99 (mg/dL)    BUN 12  6 - 23 (mg/dL)    Creatinine, Ser 0.86  0.50 - 1.10 (mg/dL)    Calcium 9.1  8.4 - 10.5 (mg/dL)    GFR calc non Af Amer 79 (*) >90 (mL/min)    GFR calc Af Amer >90  >90 (mL/min)   ETHANOL     Status: Abnormal   Collection Time   05/01/11  2:25 PM      Component Value Range Comment   Alcohol, Ethyl (B) 15 (*) 0 - 11 (mg/dL)   VALPROIC ACID LEVEL     Status: Normal   Collection Time   05/01/11  4:35 PM      Component Value Range Comment   Valproic Acid Lvl 71.4  50.0 - 100.0 (ug/mL)     Physical Findings: AIMS:   CIWA:   CIWA-Ar Total: 1  COWS:      Treatment Plan Summary: Daily contact with patient to assess and evaluate symptoms and progress in treatment Medication management    Risk of harm to self is elevated by her history of substance abuse  Risk of harm to others is elevated by her history.  Plan: We will admit the patient for crisis stabilization and treatment. I talked to pt about starting detox protocols. I explained the risks and benefits of medication in detail.  We will continue on q. 15 checks the unit protocol. At this time there is no clinical indication for one-to-one observation as patient contract for safety and presents little risk to harm themself and others.  We will increase collateral information. I encourage patient to participate in group milieu therapy. Pt will be seen in treatment team meeting tomorrow morning for further treatment and appropriate discharge planning. Please see history and physical  note for more detailed information ELOS: 3 to 5 days.   Daila Elbert 05/02/2011, 4:19 PM

## 2011-05-02 NOTE — Progress Notes (Signed)
Pt. Grumbling, "everytime I get comfortable somebody waking me up", "I just took medicine, I think I'm getting to much medicine." Writer review meds pt. Says okay and took them. Pt. Denies SHI. Staff will continue to monitor q83min for safety.

## 2011-05-02 NOTE — H&P (Signed)
Psychiatric Admission Assessment Adult  Patient Identification:  [name] Date of Evaluation:  05/02/2011 Chief Complaint:  POLYSUBSTANCE DEPENDENCE  History of Present Illness:: This is a 51 year old African-American female, admitted to [facility] from the [facility] ED with complaints of heavy crack/cocaine use. Patient reports, "I came here to get off of cocaine. I was in this hospital for detox few weeks ago. I was discharged 2 weeks ago. I got back into using cocaine again right after being discharged from here. I have been using cocaine for many years. I live in a house that is infested with drug use and drug addicts. All you see is drugs and people using drugs.  I did not take my medicines that I was discharged on last time because, I got back into using what makes feel good. You all got to help me get off of this thing"  Mood Symptoms:  Mood Swings, Depression Symptoms:  psychomotor agitation, difficulty concentrating, anxiety, (Hypo) Manic Symptoms:  Distractibility, Impulsivity, Irritable Mood, Anxiety Symptoms:  Obsessive Compulsive Symptoms:   craving for crack all the time, Psychotic Symptoms:  Hallucinations: Auditory Visual  PTSD Symptoms: Had a traumatic exposure:  "None reported  Past Psychiatric History: Diagnosis: Polysubstance abuse and dependency, Hx. Schizphrenia  Hospitalizations: [facility]  Outpatient Care: "I see Dr. Judie Petit...... Who know what?"  Substance Abuse Care: "I was in this hospital few weeks ago"  Self-Mutilation: None reported  Suicidal Attempts: None reported  Violent Behaviors: None reported   Past Medical History:   Past Medical History  Diagnosis Date  . Arthritis   . Seizures   . Psychiatric illness   . Bipolar 1 disorder   . Schizo-affective schizophrenia   . Obesity    None. Allergies:  No Known Allergies PTA Medications: Prescriptions prior to admission  Medication Sig Dispense Refill  . benztropine (COGENTIN) 0.5 MG tablet Take by  mouth 2 (two) times daily. Unsure of dose      . divalproex (DEPAKOTE ER) 500 MG 24 hr tablet Take 2 tablets (1,000 mg total) by mouth 2 (two) times daily. For mood stabilization.      . QUEtiapine (SEROQUEL) 50 MG tablet Take 50 mg by mouth 2 (two) times daily.      . risperidone (RISPERDAL) 4 MG tablet Take 4 mg by mouth daily.        Previous Psychotropic Medications:  Medication/Dose                 Substance Abuse History in the last 12 months: Substance Age of 1st Use Last Use Amount Specific Type  Nicotine 1 5 Prior to hosp 1 pack daily cigarettes  Alcohol 16 Prior to hospital 1/4 of liquor, beer   Cannabis Denies use     Opiates Denies use     Cocaine 15 "I use it everyday"  Crack cocaine  Methamphetamines Denies use     LSD Denies use     Ecstasy Denies use     Benzodiazepines 21 "I take it everyday"    Caffeine      Inhalants      Others:                         Consequences of Substance Abuse: Medical Consequences:  Liver damage, possible death by overdose Legal Consequences:  Arrests, jail times, Loss of driving pricileges. Family Consequences:  Family discord Blackouts:   DT's: Withdrawal Symptoms:   Cramps Diaphoresis Diarrhea Headaches Nausea  Tremors  Social History: Current Place of Residence: [place]  Place of Birth:  [place] Family Members: "I got 2 children" Marital Status:  Separated Children:  Sons:  Daughters: Relationships: Education:  No high school diploma Educational Problems/Performance: None reported Religious Beliefs/Practices: None reported History of Abuse (Emotional/Phsycial/Sexual): None reported Occupational Experiences: "i am unemployedSystems developer History:  None. Legal History: None reported Hobbies/Interests:None reported  Family History:   Family History  Problem Relation Age of Onset  . Alzheimer's disease Mother   . Depression Mother     Mental Status Examination/Evaluation: Objective:   Appearance: Disheveled  Patent attorney::  Fair  Speech:  Slurred some  Volume:  Increased  Mood:  Angry and Irritable  Affect:  Blunt  Thought Process:  Irrational  Orientation:  Full  Thought Content:  Rumination  Suicidal Thoughts:  No  Homicidal Thoughts:  No  Memory:  Immediate;   Good Recent;   Fair Remote;   Fair  Judgement:  Impaired  Insight:  Lacking  Psychomotor Activity:  Normal  Concentration:  Poor  Recall:  Fair  Akathisia:  No  Handed:  Right  AIMS (if indicated):     Assets:  Desire for Improvement  Sleep:              Assessment:    AXIS I:  Substance Induced Mood Disorder, Hx. Schizophrenia, Polysubstance abuse AXIS II:  Deferred AXIS III:   Past Medical History  Diagnosis Date  . Arthritis   . Seizures   . Psychiatric illness   . Bipolar 1 disorder   . Schizo-affective schizophrenia   . Obesity    AXIS IV:  other psychosocial or environmental problems and problems related to social environment AXIS V:  41-50 serious symptoms  Treatment Plan/Recommendations: Admit for safety and stabilization.                                                                Review and reinstate any pertinent home medications for other                                                                 medical issues.                                                                Obtain vitamin D levels.  Treatment Plan Summary: Daily contact with patient to assess and evaluate symptoms and progress in treatment Medication management Current Medications:  Current Facility-Administered Medications  Medication Dose Route Frequency Provider Last Rate Last Dose  . acetaminophen (TYLENOL) tablet 650 mg  650 mg Oral Q6H PRN Jorje Guild, PA      . alum & mag hydroxide-simeth (MAALOX/MYLANTA) 200-200-20 MG/5ML suspension 30 mL  30 mL Oral Q4H PRN Jorje Guild, PA      . benztropine (COGENTIN) tablet 0.5 mg  0.5 mg Oral BID Jorje Guild, PA   0.5 mg at 05/02/11 0751  . chlordiazePOXIDE (LIBRIUM) capsule 25 mg  25 mg Oral Q6H PRN Jorje Guild, PA      . chlordiazePOXIDE (LIBRIUM) capsule 25 mg  25 mg Oral QID Jorje Guild, PA   25 mg at 05/02/11 1610   Followed by  . chlordiazePOXIDE (LIBRIUM) capsule 25 mg  25 mg Oral TID Jorje Guild, PA       Followed by  . chlordiazePOXIDE (LIBRIUM) capsule 25 mg  25 mg Oral BH-qamhs Jorje Guild, PA       Followed by  . chlordiazePOXIDE (LIBRIUM) capsule 25 mg  25 mg Oral Daily Jorje Guild, [place]      . divalproex (DEPAKOTE ER) 24 hr tablet 1,000 mg  1,000 mg Oral BID Jorje Guild, PA   1,000 mg at 05/02/11 9604  . hydrOXYzine (ATARAX/VISTARIL) tablet 25 mg  25 mg Oral Q6H PRN Jorje Guild, PA      . loperamide (IMODIUM) capsule 2-4 mg  2-4 mg Oral PRN Jorje Guild, PA      . magnesium hydroxide (MILK OF MAGNESIA) suspension 30 mL  30 mL Oral Daily PRN Jorje Guild, PA      . mulitivitamin with minerals tablet 1 tablet  1 tablet Oral Daily Jorje Guild, PA   1 tablet at 05/02/11 0753  . nicotine (NICODERM CQ - dosed in mg/24 hours) patch 21 mg  21 mg Transdermal Q0600 Jorje Guild, PA   21 mg at 05/02/11 5409  . ondansetron (ZOFRAN-ODT) disintegrating tablet 4 mg  4 mg Oral Q6H PRN Jorje Guild, PA      . QUEtiapine (SEROQUEL) tablet 50 mg  50 mg Oral BID Jorje Guild, PA   50 mg at 05/02/11 0753  . risperiDONE (RISPERDAL) tablet 4 mg  4 mg Oral QHS Jorje Guild, PA      . thiamine (B-1) injection 100 mg  100 mg Intramuscular Once Jorje Guild, PA   100 mg at 05/02/11 0035  . thiamine (VITAMIN B-1) tablet 100 mg  100 mg Oral Daily Jorje Guild, PA   100 mg at 05/02/11 0753  . traZODone (DESYREL) tablet 50 mg  50 mg Oral QHS PRN Jorje Guild, PA       Facility-Administered Medications Ordered in Other Encounters  Medication Dose Route Frequency Provider Last Rate Last Dose  . DISCONTD: acetaminophen (TYLENOL) tablet 650 mg  650 mg Oral Q4H PRN Dione Booze, MD    650 mg at 05/01/11 1954  . DISCONTD: alum & mag hydroxide-simeth (MAALOX/MYLANTA) 200-200-20 MG/5ML suspension 30 mL  30 mL Oral PRN Dione Booze, MD      . DISCONTD: benztropine (COGENTIN) tablet 0.5 mg  0.5 mg Oral BID Dione Booze, MD      . DISCONTD: benztropine (COGENTIN) tablet 1 mg  1 mg Oral BID Dione Booze, MD      . DISCONTD: divalproex (DEPAKOTE ER) 24 hr tablet 1,000 mg  1,000 mg  Oral BID Dione Booze, MD      . DISCONTD: ibuprofen (ADVIL,MOTRIN) tablet 600 mg  600 mg Oral Q8H PRN Dione Booze, MD      . DISCONTD: LORazepam (ATIVAN) tablet 1 mg  1 mg Oral Q8H PRN Dione Booze, MD      . DISCONTD: nicotine (NICODERM CQ - dosed in mg/24 hours) patch 21 mg  21 mg Transdermal Daily Dione Booze, MD   21 mg at 05/01/11 1702  . DISCONTD: ondansetron (ZOFRAN) tablet 4 mg  4 mg Oral Q8H PRN Dione Booze, MD      . DISCONTD: QUEtiapine (SEROQUEL) tablet 50 mg  50 mg Oral BID Dione Booze, MD      . DISCONTD: risperiDONE (RISPERDAL) tablet 4 mg  4 mg Oral Daily Dione Booze, MD   4 mg at 05/01/11 1702  . DISCONTD: zolpidem (AMBIEN) tablet 5 mg  5 mg Oral QHS PRN Dione Booze, MD        Observation Level/Precautions:  Q 15 minutes checks for safety  Laboratory:  Obtain vitamin D levels.  Psychotherapy:  Group  Medications:  See lists  Routine PRN Medications:  Yes  Consultations:  None indicated   Discharge Concerns:  Taking medications as prescribed, staying drug free.  OtherSanjuana Kava 2/18/201311:09 AM

## 2011-05-02 NOTE — Tx Team (Signed)
Initial Interdisciplinary Treatment Plan  PATIENT STRENGTHS: (choose at least two) Active sense of humor Average or above average intelligence Capable of independent living Communication skills General fund of knowledge Motivation for treatment/growth  PATIENT STRESSORS: Financial difficulties Health problems Marital or family conflict Medication change or noncompliance Occupational concerns Substance abuse   PROBLEM LIST: Problem List/Patient Goals Date to be addressed Date deferred Reason deferred Estimated date of resolution                                                         DISCHARGE CRITERIA:  Ability to meet basic life and health needs Adequate post-discharge living arrangements Improved stabilization in mood, thinking, and/or behavior Motivation to continue treatment in a less acute level of care Need for constant or close observation no longer present Safe-care adequate arrangements made Verbal commitment to aftercare and medication compliance Withdrawal symptoms are absent or subacute and managed without 24-hour nursing intervention  PRELIMINARY DISCHARGE PLAN: Attend aftercare/continuing care group Attend PHP/IOP Attend 12-step recovery group Outpatient therapy Placement in alternative living arrangements  PATIENT/FAMIILY INVOLVEMENT: This treatment plan has been presented to and reviewed with the patient, [name], and/or family member, son or daughter, if possible.  The patient has been given the opportunity to ask questions and make suggestions.  Pixie Casino [facility] 05/02/2011, 1:47 AM

## 2011-05-02 NOTE — Progress Notes (Signed)
[facility] Group Notes:  (Counselor/Nursing/MHT/Case Management/Adjunct)    Type of Therapy:  Group Therapy  Participation Level:  Did Not Attend       [name] 05/02/2011  1:02 PM

## 2011-05-03 LAB — VITAMIN D 25 HYDROXY (VIT D DEFICIENCY, FRACTURES): Vit D, 25-Hydroxy: 23 ng/mL — ABNORMAL LOW (ref 30–89)

## 2011-05-03 MED ORDER — VITAMIN D (ERGOCALCIFEROL) 1.25 MG (50000 UNIT) PO CAPS
50000.0000 [IU] | ORAL_CAPSULE | ORAL | Status: DC
  Administered 2011-05-03: 50000 [IU] via ORAL
  Filled 2011-05-03 (×2): qty 1

## 2011-05-03 MED ORDER — CALCIUM CARBONATE-VITAMIN D 500-200 MG-UNIT PO TABS
1.0000 | ORAL_TABLET | Freq: Two times a day (BID) | ORAL | Status: DC
  Administered 2011-05-03 – 2011-05-09 (×11): 1 via ORAL
  Filled 2011-05-03 (×17): qty 1

## 2011-05-03 NOTE — Progress Notes (Signed)
Patient seen in her room to assess for discharge planning needs.  She informed of interest in a residential treatment program and signed consent for referral to [facility].  She advised she has been using ETOH an crack cocaine since age 50.  She denies SI/HI.  Patient accepted for admission to [facility] on 05/12/11.

## 2011-05-03 NOTE — Tx Team (Signed)
Interdisciplinary Treatment Plan Update (Adult)  Date:  05/03/2011  Time Reviewed:  9:50 AM   Progress in Treatment: Attending groups:   Yes   Participating in groups:  Yes Taking medication as prescribed:  Yes Tolerating medication:  Yes Family/Significant othe contact made: Counselor to follow up on collateral contact information Patient understands diagnosis:  Yes Discussing patient identified problems/goals with staff: Yes Medical problems stabilized or resolved: Yes Denies suicidal/homicidal ideation:Yes Issues/concerns per patient self-inventory:   None identified Other:  New problem(s) identified:  Reason for Continuation of Hospitalization: Medication stabilization Withdrawal symptoms Depression  Interventions implemented related to continuation of hospitalization:  Medication Management; safety checks q 15 mins  Additional comments:  Estimated length of stay: 2-5 days  Discharge Plan:  Discharge to [facility]  New goal(s):  Review of initial/current patient goals per problem list:    1.  Goal(s):  Detox from ETOH  Met:  No   Target date: c/   As evidenced by: Patient will have completed detox protocol  2.  Goal (s): Reduce/Eliminate psychotic symtoms  Met:  No  Target date: d/c  As evidenced by: Stanton Kidney will report return to baseline - no A/H or significant decrease in frequency  3.  Goal(s): Stabilize on medications  Met:  No  Target date: d/c  As evidenced by: Stanton Kidney will report medications are working - less symptomatic  4.  Goal(s):  Refer for residential treatment  Met:  Yes  Target date: d/c  As evidenced by:  Admission assessment scheduled for [facility] on 05/12/11  Attendees: Patient:     Family:     Physician:  Orson Aloe, MD 05/03/2011 9:50 AM   Nursing:   Quintella Reichert, RN 05/03/2011 9:50 AM   Case Manager:  Juline Patch, LCSW 05/03/2011 9:50 AM   Counselor:  Marni Griffon, [facility] 05/03/2011 9:50 AM   Other:  Consuello Bossier, NP 05/03/2011 9:50 AM   Other:  Reyes Ivan, LCSWA 05/03/2011  9:50 AM   Other:     Other:      Scribe for Treatment Team:   Wynn Banker, LCSW,  05/03/2011 9:50 AM

## 2011-05-03 NOTE — Progress Notes (Addendum)
[facility] MD Progress Note  05/03/2011 1:43 PM  Diagnosis:  Axis I: Polysubstance Dependence and Schizophrenia  ADL's:  Impaired  Sleep: Good  Appetite:  Good  Suicidal Ideation:  Denies suicidal thoughts Homicidal Ideation:  Denies adamantly any homicidal thoughts.  Mental Status Examination/Evaluation: Objective:  Appearance: Disheveled  Eye Contact::  Fair  Speech:  Clear and Coherent  Volume:  Increased  Mood:  Euthymic  Affect:  Congruent  Thought Process:  Coherent  Orientation:  Full  Thought Content:  WDL  Suicidal Thoughts:  No  Homicidal Thoughts:  No  Memory:  Immediate;   Good  Judgement:  Impaired  Insight:  Lacking  Psychomotor Activity:  Normal  Concentration:  Fair  Recall:  Fair  Akathisia:  No  AIMS (if indicated):     Assets:  Communication Skills Desire for Improvement Physical Health  Sleep:      Vital Signs:Blood pressure 146/88, pulse 83, temperature 97.8 F (36.6 C), temperature source Oral, resp. rate 20, last menstrual period 03/31/2011. Current Medications: Current Facility-Administered Medications  Medication Dose Route Frequency Provider Last Rate Last Dose  . acetaminophen (TYLENOL) tablet 650 mg  650 mg Oral Q6H PRN Jorje Guild, PA   650 mg at 05/02/11 1644  . alum & mag hydroxide-simeth (MAALOX/MYLANTA) 200-200-20 MG/5ML suspension 30 mL  30 mL Oral Q4H PRN Jorje Guild, PA      . benztropine (COGENTIN) tablet 0.5 mg  0.5 mg Oral BID Jorje Guild, PA   0.5 mg at 05/03/11 0749  . chlordiazePOXIDE (LIBRIUM) capsule 25 mg  25 mg Oral Q6H PRN Jorje Guild, PA      . chlordiazePOXIDE (LIBRIUM) capsule 25 mg  25 mg Oral QID Orson Aloe, MD   25 mg at 05/02/11 2142   Followed by  . chlordiazePOXIDE (LIBRIUM) capsule 25 mg  25 mg Oral TID Orson Aloe, MD   25 mg at 05/03/11 1206   Followed by  . chlordiazePOXIDE (LIBRIUM) capsule 25 mg  25 mg Oral BH-qamhs Orson Aloe, MD       Followed by  . chlordiazePOXIDE (LIBRIUM) capsule 25 mg  25 mg Oral Daily  Orson Aloe, MD      . divalproex (DEPAKOTE ER) 24 hr tablet 1,000 mg  1,000 mg Oral BID Jorje Guild, PA   1,000 mg at 05/03/11 0750  . hydrOXYzine (ATARAX/VISTARIL) tablet 25 mg  25 mg Oral Q6H PRN Jorje Guild, PA      . loperamide (IMODIUM) capsule 2-4 mg  2-4 mg Oral PRN Jorje Guild, PA      . magnesium hydroxide (MILK OF MAGNESIA) suspension 30 mL  30 mL Oral Daily PRN Jorje Guild, PA      . mulitivitamin with minerals tablet 1 tablet  1 tablet Oral Daily Jorje Guild, PA   1 tablet at 05/03/11 0750  . nicotine (NICODERM CQ - dosed in mg/24 hours) patch 21 mg  21 mg Transdermal Q0600 Jorje Guild, PA   21 mg at 05/03/11 0616  . ondansetron (ZOFRAN-ODT) disintegrating tablet 4 mg  4 mg Oral Q6H PRN Jorje Guild, PA      . QUEtiapine (SEROQUEL) tablet 50 mg  50 mg Oral BID Jorje Guild, PA   50 mg at 05/03/11 0750  . risperiDONE (RISPERDAL) tablet 4 mg  4 mg Oral QHS Jorje Guild, PA   4 mg at 05/02/11 2142  . thiamine (VITAMIN B-1) tablet 100 mg  100 mg Oral Daily Jorje Guild, PA   100 mg at 05/03/11  62  . traZODone (DESYREL) tablet 50 mg  50 mg Oral QHS PRN Jorje Guild, PA      . DISCONTD: chlordiazePOXIDE (LIBRIUM) capsule 25 mg  25 mg Oral QID Jorje Guild, PA   25 mg at 05/02/11 1245  . DISCONTD: chlordiazePOXIDE (LIBRIUM) capsule 25 mg  25 mg Oral TID Jorje Guild, PA      . DISCONTD: chlordiazePOXIDE (LIBRIUM) capsule 25 mg  25 mg Oral BH-qamhs Jorje Guild, [place]      . DISCONTD: chlordiazePOXIDE (LIBRIUM) capsule 25 mg  25 mg Oral Daily Jorje Guild, [place]        Lab Results:  Results for orders placed during the hospital encounter of 05/01/11 (from the past 48 hour(s))  VITAMIN D 25 HYDROXY     Status: Abnormal   Collection Time   05/02/11  7:53 PM      Component Value Range Comment   Vit D, 25-Hydroxy 23 (*) 30 - 89 (ng/mL)     Physical Findings: AIMS:  , ,  ,  ,    CIWA:  CIWA-Ar Total: 0  COWS:  COWS Total Score: 7   Treatment Plan Summary: Daily contact with patient to assess and evaluate symptoms and progress  in treatment Medication management  Plan: Continue detox, search for residential treatment as she is willing to go there for the first time in all her hospital detox stays. Start Vitamin D replacement as she has a level of 23. Transfer to [address], Dr K-M agrees with this transfer.  Darald Uzzle 05/03/2011, 1:43 PM

## 2011-05-03 NOTE — Progress Notes (Signed)
Pt was in the bed at the beginning of the shift, but came out and attended part of the evening AA group.  She requested, per the MHT, some Tylenol for a back ache, but went back into the group before this writer could get the med to her.   She is irritable and argumentative.  She was moved today from the [address].  She c/o minimal withdrawal symptoms at this time.  Safety maintained with q15 minute checks.

## 2011-05-03 NOTE — Progress Notes (Signed)
On patient self inventory form, patient sleeps well, has good appetite, low energy level, poor attention span.   Rated depression 33, hopelessness zero.  Denied SI.   Denied physical problems.  Plans to go to rehab after [facility] discharge.  No questions for staff.  Will be able to take meds after discharge. Patient has been encouraged several times to get out of bed and come to med window for medications.

## 2011-05-03 NOTE — Progress Notes (Signed)
Therapist requested that Pt sign consent to provide suicide prevention information.  Pt was very drowsy but stated that we had her consent to contact [name].  However, they do not have a telephone.  Pt was provided with the pamphlet.

## 2011-05-04 NOTE — Progress Notes (Signed)
Pt can be observed out of her room occasionally.  She does not attend groups.  She can be quite irritable, demanding and loud at times, but staff is able to redirect her.  She c/o off/on about back pain and her toe(gout).  She denies withdrawal symptoms.  She denies SI/HI at this time.  Pt is given Tylenol as ordered for pain.  Safety maintained with q15 minute checks.

## 2011-05-04 NOTE — Progress Notes (Signed)
[facility] Group Notes:  (Counselor/Nursing/MHT/Case Management/Adjunct)  05/04/2011 5:54 PM  Type of Therapy:  Group Therapy  Participation Level:  Minimal  Participation Quality:  Drowsy and Sharing  Affect:  Labile  Cognitive:  Oriented  Insight:  None  Engagement in Group:  None  Engagement in Therapy:  None  Modes of Intervention:  Socialization and Support  Summary of Progress/Problems:  When asked what is something interesting about yourself we may never know unless we spent a great deal of time w you?; patient shared "I am going to [facility]" Cybele appeared to be responding to internal stimuli at several points before falling asleep.   Clide Dales 05/04/2011, 5:54 PM

## 2011-05-04 NOTE — Progress Notes (Signed)
Patient ID: [name], female   DOB: 12/05/1960, 51 y.o.   MRN: [id number] Pt is awake in bed this AM. Pt mood is irritable and affect is blunted. Pt refuses to attend groups. Pt told MHT that [facility] was interfering with her drinking. However, pt states that she wishes to be sent to [facility] as soon as possible. Pt is uncooperative with staff, but she did come to the medication window for her pills this morning. Pt is hypertensive this AM. NP notified. Pt also states that she wishes to see her children but does not know where they are. Writer encouraged pt to participate in the milieu and will continue to monitor.

## 2011-05-04 NOTE — Discharge Planning (Signed)
Found pt in bed at group time c/o back pain.  Confirmed she is planning on going to [facility] next Thurs.  Also let her know staying here until then would not be an option.  Will contact ACT team today to coordinate.

## 2011-05-04 NOTE — Progress Notes (Signed)
[facility] MD Progress Note  05/04/2011 3:58 PM  Diagnosis:  Polysubstance abuse  [name] presents today requesting to leave.  She states she wants to go to [facility].  But she also states she is ready to leave and doesn't like attending groups here.  She asks about several rehab facilities including [facility] and [facility].  Mental Status Examination/Evaluation: Objective:  Appearance: Disheveled  Eye Contact::  Fair  Speech:  Garbled  Volume:  Increased  Mood:  Irritable  Affect:  Labile  Thought Process:  Goal Directed  Orientation:  Full  Thought Content:  WDL  Suicidal Thoughts:  denies  Homicidal Thoughts:  denies  Memory:  Immediate;   Poor  Judgement:  Poor  Insight:  Lacking  Psychomotor Activity:  Normal  Concentration:  Poor  Recall:    Akathisia:  No  Handed:    AIMS (if indicated):     Assets:  Resilience  Sleep:  Number of Hours: 6.5    Vital Signs:Blood pressure 137/92, pulse 81, temperature 97.9 F (36.6 C), temperature source Oral, resp. rate 19, last menstrual period 03/31/2011. Current Medications: Current Facility-Administered Medications  Medication Dose Route Frequency Provider Last Rate Last Dose  . acetaminophen (TYLENOL) tablet 650 mg  650 mg Oral Q6H PRN Jorje Guild, PA   650 mg at 05/04/11 1316  . alum & mag hydroxide-simeth (MAALOX/MYLANTA) 200-200-20 MG/5ML suspension 30 mL  30 mL Oral Q4H PRN Jorje Guild, PA      . benztropine (COGENTIN) tablet 0.5 mg  0.5 mg Oral BID Jorje Guild, PA   0.5 mg at 05/04/11 0853  . calcium-vitamin D (OSCAL WITH D) 500-200 MG-UNIT per tablet 1 tablet  1 tablet Oral BID Orson Aloe, MD   1 tablet at 05/04/11 [phone]  . chlordiazePOXIDE (LIBRIUM) capsule 25 mg  25 mg Oral Q6H PRN Jorje Guild, PA      . chlordiazePOXIDE (LIBRIUM) capsule 25 mg  25 mg Oral TID Orson Aloe, MD   25 mg at 05/03/11 1736   Followed by  . chlordiazePOXIDE (LIBRIUM) capsule 25 mg  25 mg Oral BH-qamhs Orson Aloe, MD   25 mg at 05/04/11 2130   Followed by  .  chlordiazePOXIDE (LIBRIUM) capsule 25 mg  25 mg Oral Daily Orson Aloe, MD      . divalproex (DEPAKOTE ER) 24 hr tablet 1,000 mg  1,000 mg Oral BID Jorje Guild, PA   1,000 mg at 05/04/11 0853  . hydrOXYzine (ATARAX/VISTARIL) tablet 25 mg  25 mg Oral Q6H PRN Jorje Guild, PA      . loperamide (IMODIUM) capsule 2-4 mg  2-4 mg Oral PRN Jorje Guild, PA      . magnesium hydroxide (MILK OF MAGNESIA) suspension 30 mL  30 mL Oral Daily PRN Jorje Guild, PA      . mulitivitamin with minerals tablet 1 tablet  1 tablet Oral Daily Jorje Guild, [place]   1 tablet at 05/04/11 [phone]  . nicotine (NICODERM CQ - dosed in mg/24 hours) patch 21 mg  21 mg Transdermal Q0600 Jorje Guild, PA   21 mg at 05/04/11 0853  . ondansetron (ZOFRAN-ODT) disintegrating tablet 4 mg  4 mg Oral Q6H PRN Jorje Guild, PA      . QUEtiapine (SEROQUEL) tablet 50 mg  50 mg Oral BID Jorje Guild, PA   50 mg at 05/04/11 0853  . risperiDONE (RISPERDAL) tablet 4 mg  4 mg Oral QHS Jorje Guild, PA   4 mg at 05/03/11 2119  . thiamine (VITAMIN  B-1) tablet 100 mg  100 mg Oral Daily Jorje Guild, PA   100 mg at 05/04/11 0853  . traZODone (DESYREL) tablet 50 mg  50 mg Oral QHS PRN Jorje Guild, PA      . Vitamin D (Ergocalciferol) (DRISDOL) capsule 50,000 Units  50,000 Units Oral Q7 days Orson Aloe, MD   50,000 Units at 05/03/11 1737    Lab Results:  Results for orders placed during the hospital encounter of 05/01/11 (from the past 48 hour(s))  VITAMIN D 25 HYDROXY     Status: Abnormal   Collection Time   05/02/11  7:53 PM      Component Value Range Comment   Vit D, 25-Hydroxy 23 (*) 30 - 89 (ng/mL)      CIWA:  CIWA-Ar Total: 2  COWS:  COWS Total Score: 7   Treatment Plan Summary: Will contact [facility] to verify medications and to establish treatment plan.  Left message for [facility] at [phone]. Daily contact with patient to assess and evaluate symptoms and progress in treatment Medication management  Plan:  [name] 05/04/2011, 3:58  PM

## 2011-05-04 NOTE — Progress Notes (Signed)
CM poke with CM from pt ACT team [facility]. CM reports that pt rarely takes her prescribed medications and only consistently takes the shot that she is given. She states that she came to have lunch with the pt today and has never seen the pt present the way she did today and that she appears to be overmedicated. She also reports that she has seen the pt when she is taking all of her prescribed medications and that she presents as  pleasant and in a good mood.  She also reports that pt's living arrangement is not a healthy environment because the pt is surrounded by drugs and alcohol and believes that pt will benefit from going to [facility]. ACT team is refusing to provide transportation to [facility] as a result of pt previously refusing to stay and complete the program at [facility] when taken by them. Pt will f/u with ACT team the day of d/c.

## 2011-05-05 NOTE — Treatment Plan (Signed)
Interdisciplinary Treatment Plan Update (Adult)  Date: 05/05/2011  Time Reviewed: 3:17 PM   Progress in Treatment: Attending groups: Yes Participating in groups: Yes Taking medication as prescribed: Yes Tolerating medication: Yes   Family/Significant othe contact made:  In touch with ACT team Patient understands diagnosis:  Yes  Unclear Discussing patient identified problems/goals with staff:  Yes  In treatment team talks about ambivalence about substance abuse Medical problems stabilized or resolved:  Yes Denies suicidal/homicidal ideation: Yes In tx team Issues/concerns per patient self-inventory:  Not filled out Other:  New problem(s) identified: N/A  Reason for Continuation of Hospitalization: Medication stabilization  Interventions implemented related to continuation of hospitalization: Review medications, contact ACT team to confirm prescribed meds and level of [name]'s compliance  Proceed accordingly.  Currently [name] is overmedicated  Additional comments:  Estimated length of stay:1-2 days  Discharge Plan:Return home, follow up ACT team  New goal(s): N/A  Review of initial/current patient goals per problem list:   1.  Goal(s):Safely detox from alcohol  Met:  Yes  Target date:2/21  As evidenced by:No withdrawal symptoms, stable vitals  2.  Goal (s):Stabilize on medications  Met:  No  Target date:2/22  As evidenced by:Addressing symptoms while not oversedating   3.  Goal(s):Refer to rehab  Met:  Yes  Target date:  As evidenced [id number] date at [facility]  4.  Goal(s):  Met:  No  Target date:  As evidenced by:  Attendees: Patient:  [name] 05/05/2011 3:17 PM  Family:     Physician:  Lupe Carney 05/05/2011 3:17 PM   Nursing:   Tanya Nones 05/05/2011 3:17 PM   Case Manager:  Richelle Ito, LCSW 05/05/2011 3:17 PM   Counselor:  Ronda Fairly, LCSWA 05/05/2011 3:17 PM   Other:     Other:     Other:     Other:      Scribe for  Treatment Team:   Ida Rogue, 05/05/2011 3:17 PM

## 2011-05-05 NOTE — Progress Notes (Signed)
[facility] MD Progress Note  05/05/2011 3:21 PM  S/O: Patient seen and evaluated in treatment team. Chart reviewed. Patient stated that her mood was "not good".  She felt "doped up" on all her medication started on the [address] and said she wasn't taking it all in the community.  Team verified that she usually only takes her IM meds and does not comply with any po medication.  She also said she "likes getting high" and did not plan on stopping the use of alcohol or drugs. Her affect was mood congruent and euthymic.  She was pleasant and open with the team.  She denied any current thoughts of self injurious behavior, suicidal ideation or homicidal ideation. There were no auditory or visual hallucinations, paranoia, delusional thought processes, or mania noted.  Thought process was linear and goal directed, yet brief.  No psychomotor agitation or retardation was noted. Speech was garbled and difficult to appreciate. Eye contact was good. Judgment and insight are limited.  Patient has been up and engaged on the unit.  No acute safety concerns reported from team.    Sleep:  Number of Hours: 5.25    Vital Signs:Blood pressure 130/85, pulse 90, temperature 98.3 F (36.8 C), temperature source Oral, resp. rate 20, last menstrual period 03/31/2011.  Lab Results: No results found for this or any previous visit (from the past 48 hour(s)).  Physical Findings: CIWA:  CIWA-Ar Total: 0  COWS:  COWS Total Score: 7   A/P: Alcohol and Cocaine Dependence; Alcohol W/D, resolving; Psychotic Disorder NOS   Held all meds 2/2 sedation and ACT team collateral. VA level pending. IM injection possibly due 05/06/11.  Will consider moving towards all IM dosing in light of pt's history of noncompliance and lack of interest in SA Tx. [address] placement in future as per last discharge summary.  Discussed with pt and team, who agree with plan.  Lupe Carney 05/05/2011, 3:21 PM

## 2011-05-05 NOTE — Progress Notes (Signed)
Patient in bed during this assessment, irritable and did not want to answer any question. Pt isolates to her room and not much interaction noted between patient and peers. She denied SI/HI and denied hallucinations. Q 15 minute check continues to maintain safety.

## 2011-05-05 NOTE — Progress Notes (Signed)
Patient ID: [name], female   DOB: [birth year], 51 y.o.   MRN: [id number] Pt denies SI/HI/AVH, angry and irritable today.  She has not gotten out of bed yet even with encouragement.  [name] also refused all of her morning medications--relayed to her care provider.

## 2011-05-05 NOTE — Progress Notes (Signed)
[facility] Group Notes:  (Counselor/Nursing/MHT/Case Management/Adjunct)  05/05/2011   Type of Therapy:  Group Therapy at 11:00AM & 1:15 PM  Participation Level:  Did Not Attend   Clide Dales 05/05/2011, 4:15 PM

## 2011-05-05 NOTE — Discharge Planning (Signed)
Spoke with Gardiner Ramus, nurse with [facility] ACT team who knows Margel well.  She confirmed that current meds are Risp 3BID, Seroquel 100 HS and Depakote 1000 BID.  Also Risp Consta 50 Q14days.  Due tomorrow.  Gardiner Ramus confirmed patient is notoriously noncompliant with PO meds, and thought that perhaps trying her on higher dose of Risp Consta and just giving up on PO may be the way to go.  At the same time, she does describe Turner as someone who runs her mouth a lot and tends to get in trouble because of that, so the meds may be prescribed to address mood/lability challenges.  Gardiner Ramus also says Tavaria was living in ALF until about 6 months ago, and she seemed to do better in that structure.  Will talk to Corinn about that option tomorrow.

## 2011-05-06 MED ORDER — RISPERIDONE MICROSPHERES 50 MG IM SUSR
50.0000 mg | INTRAMUSCULAR | Status: DC
  Administered 2011-05-06: 50 mg via INTRAMUSCULAR
  Filled 2011-05-06: qty 2

## 2011-05-06 NOTE — Progress Notes (Signed)
Patient ID: [name], female   DOB: May 06, 1960, 51 y.o.   MRN: [id number] 05/06/2011  Nursing [address] remains labile, loud, unable to process and she wants what she wants when she wants it ( and if she doesn't get it she will get loud and get upset). Initially, she refused her meds this AM. She paced out in the hall...demanding to be released and to go home. She did not go to groups. She is not engaged in trying to learn about her illness and / or how to help herself get better. In between yeeling and begging to bwe discharged...she demanded to be puit on the 400 hall. She states " its where I go".. A Pt is transferred to 400 hall for continuity and POC. She is ordered to be given 50 mg Risperdal Consta, IM, per MD orders. R Safety is [id number] and POC  Cont with fostering therapeutic relationship PD RN [facility]

## 2011-05-06 NOTE — Progress Notes (Signed)
[facility] Group Notes:  (Counselor/Nursing/MHT/Case Management/Adjunct)  05/06/2011 11:59 AM  Type of Therapy:  Group Therapy  Participation Level:  Did Not Attend  Participation Quality:  Resistant  Affect:  Irritable  Cognitive:  Appropriate  Insight:  None  Engagement in Group:  None  Engagement in Therapy:  None  Modes of Intervention:  Support  Summary of Progress/Problems: Stated that she did not want to participate. Proceeded to walk in and out of group while others were talking several times, mumbling profanities.  Mendleson, Mattew Chriswell 05/06/2011, 11:59 AM

## 2011-05-06 NOTE — Progress Notes (Signed)
[facility] Progress [facility]  05/06/2011 11:57 AM  S/O: Patient seen and evaluated. Chart reviewed. Patient stated that her mood was "fine" and she demanded to be discharged.  Meds held yesterday in light of sedation, slurred speech and unsteady gait.  She said in team yesterday that she felt "doped up" on all her medication started on the [address] and said she wasn't taking it all in the community.  Team verified with the ACT team yesterday that she usually only takes her IM meds and does not comply with any po medication.  She also said she "likes getting high" and did not plan on stopping the use of alcohol or drugs.  Her affect today was irritable and labile. She denied any current thoughts of self injurious behavior, suicidal ideation or homicidal ideation. She further denied any auditory or visual hallucinations, paranoia, and delusional thought processes.  Sig psychomotor agitation noted and she was pacing the halls. Judgment and insight are limited.  Furthermore, she refused the valproic acid level ordered for this am.   Sleep:  Number of Hours: 6.75    Vital Signs:Blood pressure 157/84, pulse 80, temperature 98.5 F (36.9 C), temperature source Oral, resp. rate 20, last menstrual period 03/31/2011.  Lab Results: No results found for this or any previous visit (from the past 48 hour(s)).  A/P: Alcohol and Cocaine Dependence; Alcohol W/D, resolved; Psychotic Disorder NOS   -Pt transferred to [address] s/p discussion with Dr. Allena Katz. -Held all meds yesterday 2/2 sedation, slurred speech and unsteady gait.  Rolling walker also ordered.  ACT team collateral obtained.  Informed today that she is due for her Risperdal Consta Injection: 50mg  IM q2wks.  Will discuss with Dr. Allena Katz. -VA level refused this am.  Po meds previously initiated by Jorje Guild, PA: Trazodone 50mg  qhs Quetiapine 50mg  bid Risperdal 4mg  qhs Depakote 1000mg  bid   Team will consider moving towards all IM dosing in light of  pt's history of noncompliance and lack of interest in SA Tx.   Discussed with pt and team, who agree with plan.  Lupe Carney 05/06/2011, 11:57 AM

## 2011-05-07 DIAGNOSIS — F1920 Other psychoactive substance dependence, uncomplicated: Secondary | ICD-10-CM

## 2011-05-07 MED ORDER — HYDROXYZINE HCL 50 MG PO TABS
50.0000 mg | ORAL_TABLET | Freq: Once | ORAL | Status: AC
  Administered 2011-05-07: 50 mg via ORAL

## 2011-05-07 NOTE — Progress Notes (Signed)
[facility] MD Progress Note  05/07/2011 4:06 PM  Diagnosis:  Axis I: Schizoaffective Disorder and Substance Abuse  ADL's:  Intact  Sleep: Fair  Appetite:  Fair  Suicidal Ideation:  Plan:  no Intent:  no Means:  no Homicidal Ideation:  Plan:  no Intent:  no Means:  no  AEB (as evidenced by):Patient stated that she is feeling better and wants to go home but she has no proper disposition plans.   Mental Status Examination/Evaluation: Objective:  Appearance: Bizarre, Disheveled and Guarded  Eye Contact::  Good  Speech:  Garbled and Pressured  Volume:  Increased  Mood:  Anxious and Irritable  Affect:  Labile and Full Range  Thought Process:  Disorganized  Orientation:  Full  Thought Content:  Delusions, Hallucinations: Auditory Command:  no, Ideas of Reference:   Paranoia, Paranoid Ideation and Rumination  Suicidal Thoughts:  No  Homicidal Thoughts:  No  Memory:  Immediate;   Fair  Judgement:  Impaired  Insight:  Lacking  Psychomotor Activity:  Restlessness  Concentration:  Fair  Recall:  Fair  Akathisia:  Yes  Handed:  Right  AIMS (if indicated):     Assets:  Communication Skills Leisure Time  Sleep:  Number of Hours: 3.25    Vital Signs:Blood pressure 181/126, pulse 95, temperature 98.8 F (37.1 C), temperature source Oral, resp. rate 24, last menstrual period 03/31/2011. Current Medications: Current Facility-Administered Medications  Medication Dose Route Frequency Provider Last Rate Last Dose  . acetaminophen (TYLENOL) tablet 650 mg  650 mg Oral Q6H PRN Jorje Guild, PA   650 mg at 05/05/11 1726  . alum & mag hydroxide-simeth (MAALOX/MYLANTA) 200-200-20 MG/5ML suspension 30 mL  30 mL Oral Q4H PRN Jorje Guild, PA      . benztropine (COGENTIN) tablet 0.5 mg  0.5 mg Oral BID Jorje Guild, PA   0.5 mg at 05/07/11 4098  . calcium-vitamin D (OSCAL WITH D) 500-200 MG-UNIT per tablet 1 tablet  1 tablet Oral BID Orson Aloe, MD   1 tablet at 05/07/11 0920  . hydrOXYzine  (ATARAX/VISTARIL) tablet 50 mg  50 mg Oral Once Nehemiah Settle, MD   50 mg at 05/07/11 0015  . magnesium hydroxide (MILK OF MAGNESIA) suspension 30 mL  30 mL Oral Daily PRN Jorje Guild, PA      . mulitivitamin with minerals tablet 1 tablet  1 tablet Oral Daily Jorje Guild, [place]   1 tablet at 05/07/11 0921  . nicotine (NICODERM CQ - dosed in mg/24 hours) patch 21 mg  21 mg Transdermal Q0600 Jorje Guild, PA   21 mg at 05/04/11 0853  . risperiDONE microspheres (RISPERDAL CONSTA) injection 50 mg  50 mg Intramuscular Q14 Days Franchot Gallo, MD   50 mg at 05/06/11 1813  . thiamine (VITAMIN B-1) tablet 100 mg  100 mg Oral Daily Jorje Guild, PA   100 mg at 05/07/11 0800  . Vitamin D (Ergocalciferol) (DRISDOL) capsule 50,000 Units  50,000 Units Oral Q7 days Orson Aloe, MD   50,000 Units at 05/03/11 1737    Lab Results: No results found for this or any previous visit (from the past 48 hour(s)).  Physical Findings: AIMS:  , ,  ,  ,    CIWA:  CIWA-Ar Total: 4  COWS:  COWS Total Score: 7   Treatment Plan Summary: Daily contact with patient to assess and evaluate symptoms and progress in treatment Medication management  Plan: Continue current treatment plan and disposition plans as per there primary team.  Talmadge Ganas,JANARDHAHA R. 05/07/2011, 4:06 PM

## 2011-05-07 NOTE — Progress Notes (Signed)
Pt is irritable this morning but has been in her room most of the morning   She has not been yelling but does demand to be discharged today   She took her medications without problem this morning    She has no interaction with others and is paranoid and guarded   Verbal support given  Medications administered and effectiveness monitored   Discussed why she feels like she is ready for discharge  And where she will live  Q 15 min checks  Pt just said I want to get out of here where all these crazy people are and I can go back home   Pt safe at present time

## 2011-05-07 NOTE — Progress Notes (Signed)
Patient ID: [name], female   DOB: 25-May-1960, 51 y.o.   MRN: [id number]  [facility] Group Notes:  (Counselor/Nursing/MHT/Case Management/Adjunct)  05/07/2011 11 AM  Type of Therapy:  Group Therapy, Dance/Movement Therapy   Participation Level:  Did Not Attend  Kym Groom

## 2011-05-08 NOTE — Progress Notes (Signed)
Patient ID: [name], female   DOB: 1960-12-26, 51 y.o.   MRN: [id number]  Pt. attended and participated in aftercare planning group. Pt. accepted information on suicide prevention, warning signs to look for with suicide and crisis line numbers to use. The pt. agreed to call crisis line numbers if having warning signs or having thoughts of suicide. Pt. Was confused and was unable to clearly state how she was feeling today. She was able to say that she is interested in going into a rehabilitation facility upon D/C.

## 2011-05-08 NOTE — Progress Notes (Signed)
[facility] MD Progress Note  05/08/2011 11:40 AM  Diagnosis:  Axis I: Panic Disorder and polysubstance abuse and schizophrenia, chronic  ADL's:  Intact  Sleep: Good  Appetite:  Good  Suicidal Ideation:  Plan:  no Intent:  no Means:  no Homicidal Ideation:  Plan:  no Intent:  nio Means:  no  AEB (as evidenced by): Appeared in her room and has no complaints and seems like getting ready for [facility] rehab services after discharge as per the patient. She is participating on unit activities.  Mental Status Examination/Evaluation: Objective:  Appearance: Casual  Eye Contact::  Good  Speech:  Clear and Coherent  Volume:  Increased  Mood:  Anxious and Depressed  Affect:  Appropriate and Congruent  Thought Process:  Coherent and Linear  Orientation:  Full  Thought Content:  WDL  Suicidal Thoughts:  No  Homicidal Thoughts:  No  Memory:  Immediate;   Good  Judgement:  Fair  Insight:  Fair  Psychomotor Activity:  Normal  Concentration:  Fair  Recall:  Good  Akathisia:  No  Handed:  Right  AIMS (if indicated):     Assets:  Communication Skills Leisure Time Physical Health  Sleep:  Number of Hours: 6.5    Vital Signs:Blood pressure 126/87, pulse 80, temperature 98 F (36.7 C), temperature source Oral, resp. rate 20, last menstrual period 03/31/2011. Current Medications: Current Facility-Administered Medications  Medication Dose Route Frequency Provider Last Rate Last Dose  . acetaminophen (TYLENOL) tablet 650 mg  650 mg Oral Q6H PRN Jorje Guild, PA   650 mg at 05/08/11 0538  . alum & mag hydroxide-simeth (MAALOX/MYLANTA) 200-200-20 MG/5ML suspension 30 mL  30 mL Oral Q4H PRN Jorje Guild, PA      . benztropine (COGENTIN) tablet 0.5 mg  0.5 mg Oral BID Jorje Guild, PA   0.5 mg at 05/08/11 1040  . calcium-vitamin D (OSCAL WITH D) 500-200 MG-UNIT per tablet 1 tablet  1 tablet Oral BID Orson Aloe, MD   1 tablet at 05/08/11 1040  . magnesium hydroxide (MILK OF MAGNESIA) suspension 30 mL  30  mL Oral Daily PRN Jorje Guild, PA      . mulitivitamin with minerals tablet 1 tablet  1 tablet Oral Daily Jorje Guild, PA   1 tablet at 05/08/11 1040  . nicotine (NICODERM CQ - dosed in mg/24 hours) patch 21 mg  21 mg Transdermal Q0600 Jorje Guild, PA   21 mg at 05/08/11 0538  . risperiDONE microspheres (RISPERDAL CONSTA) injection 50 mg  50 mg Intramuscular Q14 Days Franchot Gallo, MD   50 mg at 05/06/11 1813  . thiamine (VITAMIN B-1) tablet 100 mg  100 mg Oral Daily Jorje Guild, PA   100 mg at 05/08/11 1039  . Vitamin D (Ergocalciferol) (DRISDOL) capsule 50,000 Units  50,000 Units Oral Q7 days Orson Aloe, MD   50,000 Units at 05/03/11 1737    Lab Results: No results found for this or any previous visit (from the past 48 hour(s)).  Physical Findings: AIMS:  , ,  ,  ,    CIWA:  CIWA-Ar Total: 4  COWS:  COWS Total Score: 7   Treatment Plan Summary: Daily contact with patient to assess and evaluate symptoms and progress in treatment Medication management  Plan: Patient wants to go to [facility] rehab and spoken with case manager. She has no medication changes during this visit.   Kylen Schliep,JANARDHAHA R. 05/08/2011, 11:40 AM

## 2011-05-08 NOTE — Progress Notes (Signed)
Pt was in bed until 10 am this morning  She appears depressed and sad  She did attend spirituality group and participated well  Her concerns are that she wants to be discharged home but is worried about being strong enough to not start using drugs again  She is considering a rehab program but isnt sure  She is pleasant but does not socialize much with others as she admitted she doesn't know if anybody here likes her  Verbal support given  Encouraged pt to talk with staff and other pts  Encouraged pt to strongly consider rehab as it would give her more knowledge about how to stay sober and strong  Medications administered and effectiveness monitored  Q 15 min checks   Pt safe at present

## 2011-05-08 NOTE — Progress Notes (Signed)
Patient ID: [name], female   DOB: January 16, 1961, 51 y.o.   MRN: [id number] Has been somewhat isolative this evening in her room, quiet and guarded in conversation, came to med window and stated she had a headache and just wanted to go to bed.  Denied voices or thoughts of self harm, was given tylenol for h/a which she rated a "10" and went back to her room. Resting, will continue to monitor.

## 2011-05-08 NOTE — Progress Notes (Signed)
[facility] Group Notes:  (Counselor/Nursing/MHT/Case Management/Adjunct)  05/08/2011 11:12 AM  Type of Therapy:  Group Therapy  Participation Level:  Active  Participation Quality:  Redirectable and Sharing  Affect:  Labile  Cognitive:  Oriented  Insight:  Limited  Engagement in Group:  Good  Engagement in Therapy:  Good  Modes of Intervention:  Problem-solving, Support and exploration  Summary of Progress/Problems: Pt was able to participate in group therapy, group asked to share how they were feeling via a color and pt stated she was feeling brown today but gave no explanation as to why. Pt was able to share that her mother past several weeks ago and that she feels at times that is all she had. Pt does not want to return home to the house she was living in and would like to find a long term treat team. Pt shared her kids are her supports but she has been able to get into contact with them. Pt was able to share two things she can do to support herself including prayer and putting the focus on herself. [name], LPCA    [name] [name] 05/08/2011, 11:12 AM

## 2011-05-09 MED ORDER — RISPERIDONE MICROSPHERES 50 MG IM SUSR: 50.0000 mg | INTRAMUSCULAR | Status: DC

## 2011-05-09 NOTE — Discharge Summary (Signed)
Physician Discharge Summary Note  Patient:  [name] is an 51 y.o., female MRN:  [id number] DOB:  11/04/1960 Patient phone:  [phone] (home)  Patient address:   [address] [place] [address],   Date of Admission:  05/01/2011 Date of Discharge: 05/09/2011  Reason for Admission:  Discharge Diagnoses: Principal Problem:  *Polysubstance abuse Active Problems:  Schizophrenia   Axis Diagnosis:   AXIS I:  Schizophrenia, Paranoid Type; Alcohol Dependence; Cocaine Dependence AXIS II:  No diagnosis AXIS III:  Arthritis, History of Seiqures.  Past Medical History  Diagnosis Date  . Arthritis   . Seizures   . Psychiatric illness   . Bipolar 1 disorder   . Schizo-affective schizophrenia   . Obesity    AXIS IV:  Chronic social Issues; Burden of Mental Illness AXIS V:  51-60 moderate symptoms  Level of Care:  OP  Hospital Course:   this is one of several admissions for [name] who presented requesting detox from alcohol and cocaine. She reports that she has been using both daily for more than a year. She has a history of previous detox here , and previous length of sobriety is not clear. She lives in her own apartment in a rooming house, previously lived in assisted living. She has a history of chronic schizophrenia, paranoid type and substance dependence.  She was admitted to our detox program and safely detoxed with Librium protocol. We contacted [facility], her act team who reported she is chronically noncompliant with oral medications, and [name] herself confirm this. She received her regular Risperdal Consta injection on February 22, 50 mg IM. We have not attempted to restart her oral medications. By February 25 she is completely detoxed, pleasant, calm, and denying any dangerous ideas and ready for discharge.  Consults:  None  Significant Diagnostic Studies:  None  Discharge Vitals:   Blood pressure 134/97, pulse 125, temperature 98.1 F (36.7 C),  temperature source Oral, resp. rate 24, last menstrual period 03/31/2011.  Mental Status Exam: See Mental Status Examination and Suicide Risk Assessment completed by Attending Physician prior to discharge.  Discharge destination:  Home  Is patient on multiple antipsychotic therapies at discharge:  No   Has Patient had three or more failed trials of antipsychotic monotherapy by history:  No  Recommended Plan for Multiple Antipsychotic Therapies: n/a   Medication List  As of 05/09/2011  9:44 AM   STOP taking these medications         benztropine 0.5 MG tablet      divalproex 500 MG 24 hr tablet      QUEtiapine 50 MG tablet      risperidone 4 MG tablet         TAKE these medications      Indication    risperiDONE microspheres 50 MG injection   Commonly known as: RISPERDAL CONSTA   Inject 2 mLs (50 mg total) into the muscle every 14 (fourteen) days. For psychosis.  Last given Friday, May 06, 2011            Follow-up Information    Follow up with [facility] on 05/16/2011. (You are scheduled for an admission assessment at [facility] on Thursday, May 12, 2011 at 8 AM)    Contact information:   [address] [place], [place]  [address]  [phone]      Follow up with [facility]. (Pt will f/u with ACT Team upon discharge)    Contact information:  [address], [place], [place] [address] [phone]         Follow-up recommendations:  Activity:  unrestricted Diet:  regular  Signed: Kamran Coker A 05/09/2011, 9:44 AM

## 2011-05-09 NOTE — Tx Team (Signed)
Interdisciplinary Treatment Plan Update (Adult)  Date:  05/09/2011  Time Reviewed:  10:15AM-11:00AM  Progress in Treatment: Attending groups:  Yes Participating in groups:    Yes, appropriately Taking medication as prescribed:    Yes Tolerating medication:   Yes Family/Significant other contact made:  Contact made with ACTT Patient understands diagnosis:   Limited understanding Discussing patient identified problems/goals with staff:   Yes Medical problems stabilized or resolved:   N/A Denies suicidal/homicidal ideation:  yes Issues/concerns per patient self-inventory:   None Other:    New problem(s) identified: No, Describe:    Reason for Continuation of Hospitalization: None  Interventions implemented related to continuation of hospitalization:  Medication monitoring and adjustment, safety checks Q15 min., suicide risk assessment, group therapy, psychoeducation, collateral contact, aftercare planning, ongoing physician assessments, medication education - until discharge  Additional comments:  Not applicable  Estimated length of stay:  Discharge today  Discharge Plan:  Will follow up with [facility] ACTT and has an intake appointment at [facility] on 05/11/10.  New goal(s):  Not applicable  Review of initial/current patient goals per problem list:   1.  Goal(s):  Stabilize on medications while not oversedating  Met:  Yes  Target date:  By Discharge   As evidenced by:  Patient is stable and ready for discharge  2.  Goal(s):  Determine whether patient will go to rehab.  Met:  Yes  Target date:  By Discharge   As evidenced by:  Patient and ACTT are discussing how involved the ACTT will be in transporting patient to her rehab appointment.     Attendees: Patient:  [name] - seen individually by team members 05/09/2011 10:15AM-11:00AM  Family:     Physician:     Nursing:   Robbie Louis, RN 05/09/2011 10:15AM -11:00AM   Case Manager:  Ambrose Mantle, LCSW 05/09/2011 10:15AM-11:00AM  Counselor:  [name], MT-BC 05/09/2011 10:15AM-11:00AM  Other:   Lynann Bologna, NP 05/09/2011 10:15AM-11:00AM  Other:   Tacy Learn, RN 05/09/2011 10:15AM-11:00AM  Other:      Other:       Scribe for Treatment Team:   Sarina Ser, 05/09/2011, 10:15AM-11:15AM

## 2011-05-09 NOTE — Progress Notes (Signed)
Recreation Therapy Notes  05/09/2011         Time: 0930      Group Topic/Focus: The focus of this group is on enhancing the patient's understanding of leisure, barriers to leisure, and the importance of engaging in positive leisure activities upon discharge for improved total health.  Participation Level: Active  Participation Quality: Redirectable and Intrusive  Affect: Excited  Cognitive: Confused   Additional Comments: Patient hyperactive and intrusive, talking over peers, patient easily redirected. Patient at the conclusion of group asked "what hall am I on?" patient was surprised when RT told her it was the [address] hall and even more surprised when she realized she had been there since Friday.     Cera Rorke 05/09/2011 1:49 PM

## 2011-05-09 NOTE — Progress Notes (Signed)
[facility] Group Notes:  (Counselor/Nursing/MHT/Case Management/Adjunct)  05/09/2011 1:52 PM  Type of Therapy:  Group Therapy  Participation Level:  Active  Participation Quality:  Attentive and Sharing  Affect:  Appropriate  Cognitive:  Oriented  Insight:  Limited  Engagement in Group:  Good  Engagement in Therapy:  Good  Modes of Intervention:  Education, Problem-solving, Support and Exploration  Summary of Progress/Problems: Patient stated that she doesn't want to go to rehab right away because she has some things she needs to do first, which included getting her room straight. She would eventually like to go to another living situation due to the drugs. Talked about her treatment at [facility] but left there afer 2 1/2 months. Talked about mother's death in 23-Mar-2023. Talked about problems with her children and doesn't know where some of them are right now.   Alben Jepsen, Aram Beecham 05/09/2011, 1:52 PM

## 2011-05-09 NOTE — Discharge Summary (Signed)
Pt seen and evaluated upon discharge, completed Suicide Risk Assessment. Pt agreeable with plan.  Discussed with team.  See SRA/note.

## 2011-05-09 NOTE — Progress Notes (Signed)
Patient ID: [name], female   DOB: December 01, 1960, 51 y.o.   MRN: [id number] Has been out on the hall and in and out of dayroom this evening, interacting very little with peers but had snack, no meds scheduled at hs.   Talked for a few minutes about her plans for discharge.  States she wants to go into long term and asked about [facility], is afraid if she goes back to where she has been, she will relapse. Wants to talk with CM in am about trying to make arrangements for that.  No c/o's tonight, going to bed.  Will continue to monitor.

## 2011-05-09 NOTE — Progress Notes (Signed)
Pt was discharged home today.  She denied any S/I H/I or A/V hallucinations.    She was given f/u appointment, rx, and hotline info booklet.  She voiced understanding to all instructions provided.  She declined the need for smoking cessation materials.  She removed her nicotine patch before she left. 

## 2011-05-09 NOTE — Discharge Planning (Signed)
Met with patient in Aftercare Planning Group.   Although RN note from last night at 11pm stated that patient is interested in going to rehab and is afraid of relapsing if she returns to her boarding house, she insisted this morning that she must return to her boarding house and is not interested in cigarettes or drugs or alcohol, and does not need to go to rehab.  She wants CM to call her [facility] ACTT to pick her up and transport her home today.  When told about her upcoming appointment for assessment at [facility] on Thursday 2/28, she said she would not go.  Patient was pleasant but adamant.  Ambrose Mantle, LCSW 05/09/2011, 9:18 AM

## 2011-05-09 NOTE — Progress Notes (Signed)
[facility] Case Management Discharge Plan:  Will you be returning to the same living situation after discharge: Yes,  to boarding house At discharge, do you have transportation home?:Yes,  ACTT will transport Do you have the ability to pay for your medications:Yes,  income and insurance  Interagency Information:     Release of information consent forms completed and in the chart;  Patient's signature needed at discharge.  Patient to Follow up at:  Follow-up Information    Follow up with [facility] [place] [facility] on 05/16/2011. (You are scheduled for an admission assessment at [facility] on Thursday, May 12, 2011 at 8 AM)    Contact information:   [address] [place], [place]  [address]  [phone]      Follow up with [facility] on 05/09/2011. (After 1:00PM)    Contact information:   [address], [place], [place] [address] [phone]         Patient denies SI/HI:   Yes,      Safety Planning and Suicide Prevention discussed:  Yes,    Barrier to discharge identified:No.  Summary and Recommendations:  Keep involved with [facility].  Go for assessment at [facility], hopefully be admitted to do 28-day residential treatment program.   Sarina Ser 05/09/2011, 12:25 PM

## 2011-05-09 NOTE — [facility] Suicide Risk Assessment (Signed)
Suicide Risk Assessment  Discharge Assessment      Demographic factors: See chart.    Current Mental Status: Patient seen and evaluated. Chart reviewed. Patient stated that her mood was "good". She received her injection on Friday, 2.22.13. No SEs reported.  Pt denied any CP/SOB/N/V/D. Her affect was mood congruent and euthymic.  She said her weekend was "fine".  She denied any current thoughts of self injurious behavior, suicidal ideation or homicidal ideation. She said that there were no auditory or visual hallucinations and she preferred the "shot" over pills for Tx.  No paranoia, delusional thought processes, or mania noted.  Thought process was linear and goal directed.  No psychomotor agitation or retardation was noted. Speech was normal rate, tone and volume. Eye contact was good. Judgment and insight are limited.  Patient has been up and engaged on the unit.  No acute safety concerns reported from team.  Loss Factors: Financial problems / change in socioeconomic status   Historical Factors: Impulsivity; med noncompliance; episodic SI reported per team; multiple past psychiatric admissions; poor compliance with po meds only - complies with IM Tx   Risk Reduction Factors: Sense of responsibility to family; f/u with ACT team   CLINICAL FACTORS: Alcohol and Cocaine Dependence; Alcohol W/D, resolved; Psychotic Disorder NOS   COGNITIVE FEATURES THAT CONTRIBUTE TO RISK: thought constriction (tunnel vision); limited insight   SUICIDE RISK: Pt viewed as a chronic moderate increased risk of harm to self in light of her past hx and risk factors.  No acute safety concerns on the unit.  Pt contracting for safety and requesting discharge.  Vitals:  Filed Vitals:   05/09/11 0741  BP: 134/97  Pulse: 125  Temp:   Resp:    Repeat vitals: 1245 pm - 128/83 with HR 89.  Plan: Pt requesting discharge. Pt not invested in sobriety/SA Tx, limited insight.  Nevertheless, pt is contracting for safety and  does not currently meet [place] involuntary commitment criteria for continued hospitalization against her will.  Mental health treatment, medication management and continued sobriety will mitigate against the increased risk of harm to self and/or others.  Discussed the importance of recovery further with pt, as well as, tools to move forward in a healthy & safe manner.  Pt agreeable with the plan.  Discussed with the team.  Please see orders, follow up plans per team and full discharge summary completed by physician extender.  Follow up with ACT team.  Pt still "thinking about rehab", recommend further residential SA Tx if pt agrees through ACT team/outpt follow up.   400 hall recommended if pt returns for further Tx.   [name] 05/09/2011, 12:41 PM

## 2011-05-12 NOTE — Progress Notes (Signed)
Patient Discharge Instructions:  Psychiatric Admission Assessment Note Faxed,  05/12/2011 Discharge Summary Note Faxed,   05/12/2011 After Visit Summary (AVS) Faxed,  05/12/2011 Face Sheet Faxed, 05/12/2011 Faxed to the Next Level Care provider:  05/12/2011  Faxed to [facility] @ [phone] And to [facility] @ [phone]  Wandra Scot, 05/12/2011, 11:46 AM

## 2011-05-15 ENCOUNTER — Emergency Department (HOSPITAL_COMMUNITY)
Admission: EM | Admit: 2011-05-15 | Discharge: 2011-05-15 | Disposition: A | Discharge status: Home / Self Care | Payer: Medicare Other | Attending: Emergency Medicine | Admitting: Emergency Medicine

## 2011-05-15 ENCOUNTER — Encounter (HOSPITAL_COMMUNITY): Payer: Self-pay | Admitting: *Deleted

## 2011-05-15 ENCOUNTER — Emergency Department (HOSPITAL_COMMUNITY)
Admission: EM | Admit: 2011-05-15 | Discharge: 2011-05-15 | Discharge status: Against Medical Advice | Payer: Medicare Other | Attending: Emergency Medicine | Admitting: Emergency Medicine

## 2011-05-15 DIAGNOSIS — Z0389 Encounter for observation for other suspected diseases and conditions ruled out: Secondary | ICD-10-CM | POA: Insufficient documentation

## 2011-05-15 DIAGNOSIS — F411 Generalized anxiety disorder: Secondary | ICD-10-CM | POA: Insufficient documentation

## 2011-05-15 DIAGNOSIS — M545 Low back pain, unspecified: Secondary | ICD-10-CM | POA: Insufficient documentation

## 2011-05-15 DIAGNOSIS — F1910 Other psychoactive substance abuse, uncomplicated: Secondary | ICD-10-CM | POA: Insufficient documentation

## 2011-05-15 DIAGNOSIS — F17200 Nicotine dependence, unspecified, uncomplicated: Secondary | ICD-10-CM | POA: Insufficient documentation

## 2011-05-15 LAB — COMPREHENSIVE METABOLIC PANEL
ALT: 36 U/L — ABNORMAL HIGH (ref 0–35)
AST: 20 U/L (ref 0–37)
Albumin: 3.3 g/dL — ABNORMAL LOW (ref 3.5–5.2)
Alkaline Phosphatase: 59 U/L (ref 39–117)
Potassium: 3.8 mEq/L (ref 3.5–5.1)
Sodium: 139 mEq/L (ref 135–145)
Total Protein: 6.9 g/dL (ref 6.0–8.3)

## 2011-05-15 LAB — CBC
MCH: 29.8 pg (ref 26.0–34.0)
MCHC: 33.4 g/dL (ref 30.0–36.0)
Platelets: 334 10*3/uL (ref 150–400)
RBC: 4.46 MIL/uL (ref 3.87–5.11)

## 2011-05-15 LAB — RAPID URINE DRUG SCREEN, HOSP PERFORMED
Amphetamines: NOT DETECTED
Barbiturates: NOT DETECTED
Benzodiazepines: POSITIVE — AB
Cocaine: POSITIVE — AB
Tetrahydrocannabinol: NOT DETECTED

## 2011-05-15 LAB — DIFFERENTIAL
Basophils Relative: 0 % (ref 0–1)
Eosinophils Absolute: 0.1 10*3/uL (ref 0.0–0.7)
Neutro Abs: 2.7 10*3/uL (ref 1.7–7.7)
Neutrophils Relative %: 46 % (ref 43–77)

## 2011-05-15 MED ORDER — IBUPROFEN 600 MG PO TABS
600.0000 mg | ORAL_TABLET | Freq: Three times a day (TID) | ORAL | Status: DC | PRN
  Administered 2011-05-15: 600 mg via ORAL
  Filled 2011-05-15: qty 1

## 2011-05-15 MED ORDER — ONDANSETRON HCL 4 MG PO TABS: 4.0000 mg | ORAL_TABLET | Freq: Three times a day (TID) | ORAL | Status: DC | PRN

## 2011-05-15 MED ORDER — ZOLPIDEM TARTRATE 5 MG PO TABS: 5.0000 mg | ORAL_TABLET | Freq: Every evening | ORAL | Status: DC | PRN

## 2011-05-15 MED ORDER — ACETAMINOPHEN 325 MG PO TABS: 650.0000 mg | ORAL_TABLET | ORAL | Status: DC | PRN

## 2011-05-15 MED ORDER — NICOTINE 21 MG/24HR TD PT24: 21.0000 mg | MEDICATED_PATCH | Freq: Every day | TRANSDERMAL | Status: DC | PRN

## 2011-05-15 NOTE — ED Provider Notes (Signed)
Dr. Henderson Cloud, telepsych, has evaluated the patient.  He notes that the patient is stable for discharge with outpatient follow-up care.  D/C home.  Gerhard Munch, MD 05/15/11 [phone]

## 2011-05-15 NOTE — ED Notes (Signed)
Toiletries given to pt for showering-pleasant/cooperative-voicing no complaints

## 2011-05-15 NOTE — ED Notes (Signed)
MD at bedside. 

## 2011-05-15 NOTE — ED Notes (Signed)
MD notified of pt BP. Stated was OK to discharge pt.

## 2011-05-15 NOTE — ED Notes (Signed)
Pt from a boarding house by [facility] for detox from crack and alcohol, denies SI/HI.

## 2011-05-15 NOTE — BH Assessment (Signed)
Assessment Note   [name] is an 51 y.o. female who presented to [facility] Emergency Department with the chief complaint of wanting detox from crack/cocaine and alcohol. Patient reported to Clinical research associate that she has been using illicit drugs for an extensive period of time and that she now wants to get clean. "I'm tired of having sex for drugs. I don't want no disease. I been on the streets too long. The dope game ain't no joke." Patient stated that this weekend she was drinking heavily and using drugs with her friends. Afterwards, patient decided to call the crisis line and speak to someone about become drug free. "The lady on the phone told me to come here and get some help.  I'm done with this crack mess this time." Patient has a noted history of psychiatric inpatient treatment as well as substance abuse outpatient services in the past. Patient disclosed that she has been in a cycle of using substances and having sex for the drugs. Patient did verbalize that her religious faith does help her in times of need. "I pray when I'm on drugs. It helps." Patient reported to writer that she was scheduled to go to [facility] on Friday for outpatient services but did not make it due to being intoxicated at the time. Patient denies AVH/HI/SI at this time.   Axis I: Bipolar, Depressed and Schizoaffective Disorder Axis II: Deferred Axis III:  Past Medical History  Diagnosis Date  . Arthritis   . Seizures   . Psychiatric illness   . Bipolar 1 disorder   . Schizo-affective schizophrenia   . Obesity    Axis IV: other psychosocial or environmental problems and problems related to social environment Axis V: 51-60 moderate symptoms  Past Medical History:  Past Medical History  Diagnosis Date  . Arthritis   . Seizures   . Psychiatric illness   . Bipolar 1 disorder   . Schizo-affective schizophrenia   . Obesity     Past Surgical History  Procedure Date  . Stomach surgery     Family History:  Family  History  Problem Relation Age of Onset  . Alzheimer's disease Mother   . Depression Mother     Social History:  reports that she has been smoking Cigarettes.  She has been smoking about 1 pack per day. She has never used smokeless tobacco. She reports that she drinks about 3.6 ounces of alcohol per week. She reports that she uses illicit drugs ("Crack" cocaine) about 5 times per week.  Additional Social History:  Alcohol / Drug Use History of alcohol / drug use?: Yes Substance #1 Name of Substance 1: Crack/cocaine 1 - Age of First Use: 23 1 - Amount (size/oz): varies 1 - Frequency: daily 1 - Duration: years 1 - Last Use / Amount: 05/14/11- 8 balls Substance #2 Name of Substance 2: ETOH 2 - Age of First Use: 17 2 - Amount (size/oz): "anything I get my hands on" 2 - Frequency: daily 2 - Duration: years 2 - Last Use / Amount: 05/14/11 1/5th of gin Allergies: No Known Allergies  Home Medications:  Medications Prior to Admission  Medication Dose Route Frequency Provider Last Rate Last Dose  . acetaminophen (TYLENOL) tablet 650 mg  650 mg Oral Q4H PRN Flint Melter, MD      . ibuprofen (ADVIL,MOTRIN) tablet 600 mg  600 mg Oral Q8H PRN Flint Melter, MD      . nicotine (NICODERM CQ - dosed in mg/24 hours) patch 21  mg  21 mg Transdermal Daily PRN Flint Melter, MD      . ondansetron [facility]) tablet 4 mg  4 mg Oral Q8H PRN Flint Melter, MD      . zolpidem (AMBIEN) tablet 5 mg  5 mg Oral QHS PRN Flint Melter, MD       Medications Prior to Admission  Medication Sig Dispense Refill  . risperiDONE microspheres (RISPERDAL CONSTA) 50 MG injection Inject 2 mLs (50 mg total) into the muscle every 14 (fourteen) days. For psychosis.  Last given Friday, May 06, 2011  1 each  0    OB/GYN Status:  Patient's last menstrual period was 05/01/2011.  General Assessment Data Location of Assessment: [facility] ED Living Arrangements: Other (Comment) (Boarding House) Can pt return to current living  arrangement?: Yes Admission Status: Voluntary Is patient capable of signing voluntary admission?: Yes Transfer from: Acute Hospital Referral Source: Self/Family/Friend     Risk to self Suicidal Ideation: No Suicidal Intent: No Is patient at risk for suicide?: No Suicidal Plan?: No Access to Means: Yes Specify Access to Suicidal Means: Access to streets What has been your use of drugs/alcohol within the last 12 months?: ETOH & Crack/Cocaine Previous Attempts/Gestures: No How many times?: 0  Other Self Harm Risks: None Triggers for Past Attempts: None known Intentional Self Injurious Behavior: None Family Suicide History: No Recent stressful life event(s): Other (Comment) (Use of illicit drugs and relapse) Persecutory voices/beliefs?: No Depression: No Substance abuse history and/or treatment for substance abuse?: Yes  Risk to Others Homicidal Ideation: No Thoughts of Harm to Others: No Current Homicidal Intent: No Current Homicidal Plan: No Access to Homicidal Means: No Identified Victim: None Reported History of harm to others?: No Assessment of Violence: None Noted Violent Behavior Description: N/A Does patient have access to weapons?: No Criminal Charges Pending?: No Does patient have a court date: No  Psychosis Hallucinations: None noted Delusions: None noted  Mental Status Report Appear/Hygiene: Disheveled Eye Contact: Good Motor Activity: Restlessness;Freedom of movement Speech: Logical/coherent Level of Consciousness: Alert;Restless Mood: Anxious Affect: Appropriate to circumstance Anxiety Level: None Thought Processes: Coherent;Relevant Judgement: Impaired Orientation: Person;Place;Time;Situation Obsessive Compulsive Thoughts/Behaviors: None  Cognitive Functioning Concentration: Normal Memory: Recent Intact;Remote Intact IQ: Average Insight: Fair Impulse Control: Poor Appetite: Fair Weight Loss: 0  Weight Gain: 0  Sleep: No Change Total  Hours of Sleep: 5  Vegetative Symptoms: None  Prior Inpatient Therapy Prior Inpatient Therapy: Yes Prior Therapy Facilty/Provider(s): [facility], [facility], [facility], [facility] Reason for Treatment: Detox  Prior Outpatient Therapy Prior Outpatient Therapy: Yes Prior Therapy Dates: current Prior Therapy Facilty/Provider(s): [facility] Reason for Treatment: Psych  ADL Screening (condition at time of admission) Patient's cognitive ability adequate to safely complete daily activities?: Yes Patient able to express need for assistance with ADLs?: Yes Independently performs ADLs?: Yes Weakness of Legs: None Weakness of Arms/Hands: None  Home Assistive Devices/Equipment Home Assistive Devices/Equipment: None  Therapy Consults (therapy consults require a physician order) PT Evaluation Needed: No OT Evalulation Needed: No Abuse/Neglect Assessment (Assessment to be complete while patient is alone) Physical Abuse: Denies Verbal Abuse: Denies Sexual Abuse: Denies Exploitation of patient/patient's resources: Denies Self-Neglect: Denies Values / Beliefs Cultural Requests During Hospitalization: None Spiritual Requests During Hospitalization: None Consults Spiritual Care Consult Needed: No Social Work Consult Needed: No      Additional Information 1:1 In Past 12 Months?: No CIRT Risk: No Elopement Risk: No Does patient have medical clearance?: Yes     Disposition: Referral to  Specialist On Call per request of MD to determine plan of disposition. Disposition Disposition of Patient: Referred to Type of inpatient treatment program:  (Specialist on Call)  On Site Evaluation by:  Self Reviewed with Physician:     Paulino Door, Aftan Vint C 05/15/2011 2:10 PM

## 2011-05-15 NOTE — ED Provider Notes (Signed)
History     CSN: [id number]  Arrival date & time 05/15/11  0825   First MD Initiated Contact with Patient 05/15/11 [phone]      Chief Complaint  Patient presents with  . V70.1    (Consider location/radiation/quality/duration/timing/severity/associated sxs/prior treatment) HPI Comments: [name] is a 51 y.o. Female who presents after using crack and alcohol heavily over the weekend requesting detox. She denies suicidal or homicidal ideation. She has right low back pain that is chronic. She denies urinary symptoms, nausea, vomiting, fever, chills, cough, chest pain. She states she last had her Risperdal injection while in the hospital. She was hospitalized 05/01/11; for cocaine and alcohol abuse, was detoxified with Librium protocol, and discharged to the community. The patient states that she lives in an environment that is  not conducive to avoidance of substance abuse.  The history is provided by the patient.    Past Medical History  Diagnosis Date  . Arthritis   . Seizures   . Psychiatric illness   . Bipolar 1 disorder   . Schizo-affective schizophrenia   . Obesity     Past Surgical History  Procedure Date  . Stomach surgery     Family History  Problem Relation Age of Onset  . Alzheimer's disease Mother   . Depression Mother     History  Substance Use Topics  . Smoking status: Current Everyday Smoker -- 1.0 packs/day    Types: Cigarettes  . Smokeless tobacco: Never Used  . Alcohol Use: 3.6 oz/week    6 Cans of beer per week    OB History    Grav Para Term Preterm Abortions TAB SAB Ect Mult Living                  Review of Systems  All other systems reviewed and are negative.    Allergies  Review of patient's allergies indicates no known allergies.  Home Medications   Current Outpatient Rx  Name Route Sig Dispense Refill  . RISPERIDONE MICROSPHERES 50 MG IM SUSR Intramuscular Inject 2 mLs (50 mg total) into the muscle every 14 (fourteen) days. For  psychosis.  Last given Friday, May 06, 2011 1 each 0    BP 175/106  Pulse 86  Temp(Src) 98.6 F (37 C) (Oral)  Resp 20  Wt 215 lb (97.523 kg)  SpO2 95%  LMP 05/01/2011  Physical Exam  Nursing note and vitals reviewed. Constitutional: She is oriented to person, place, and time. She appears well-developed and well-nourished.  HENT:  Head: Normocephalic and atraumatic.  Eyes: Conjunctivae and EOM are normal. Pupils are equal, round, and reactive to light.  Neck: Normal range of motion and phonation normal. Neck supple.  Cardiovascular: Normal rate, regular rhythm and intact distal pulses.   Pulmonary/Chest: Effort normal and breath sounds normal. She exhibits no tenderness.  Abdominal: Soft. She exhibits no distension. There is no tenderness. There is no guarding.  Musculoskeletal:       Right lumbar tenderness with decreased motion, secondary to pain.  Neurological: She is alert and oriented to person, place, and time. She has normal strength. She exhibits normal muscle tone.       Normal gait  Skin: Skin is warm and dry.  Psychiatric: Her behavior is normal.       anxious    ED Course  Procedures (including critical care time)  Labs Reviewed  COMPREHENSIVE METABOLIC PANEL - Abnormal; Notable for the following:    Glucose, Bld 152 (*)  Albumin 3.3 (*)    ALT 36 (*)    Total Bilirubin 0.2 (*)    GFR calc non Af Amer 71 (*)    GFR calc Af Amer 82 (*)    All other components within normal limits  URINE RAPID DRUG SCREEN (HOSP PERFORMED) - Abnormal; Notable for the following:    Cocaine POSITIVE (*)    Benzodiazepines POSITIVE (*)    All other components within normal limits  CBC  DIFFERENTIAL  ETHANOL  ACETAMINOPHEN LEVEL  POCT PREGNANCY, URINE  LAB REPORT - SCANNED  URINE CULTURE   No results found.   1. Polysubstance abuse       MDM  EtOH and Cocaine abuse. Recent hospitalization and detoxification.        Flint Melter, MD 05/16/11 [phone]

## 2011-05-15 NOTE — ED Notes (Signed)
Pt discharged via cab to home. Denies SI/HI or A/VH.

## 2011-05-15 NOTE — ED Notes (Signed)
Pt changed into blue scrubs, security in to wand pt and pt belongings, lab also in for blood draw.

## 2011-05-15 NOTE — BHH Counselor (Signed)
Pt given follow up info to [facility] due to being d/c by Tele Psych

## 2011-06-27 ENCOUNTER — Emergency Department (HOSPITAL_COMMUNITY)
Admission: EM | Admit: 2011-06-27 | Discharge: 2011-06-28 | Disposition: A | Discharge status: Other Acute Inpatient Hospital | Payer: Medicare Other | Source: Home / Self Care | Attending: Emergency Medicine | Admitting: Emergency Medicine

## 2011-06-27 ENCOUNTER — Encounter (HOSPITAL_COMMUNITY): Payer: Self-pay

## 2011-06-27 DIAGNOSIS — Z8659 Personal history of other mental and behavioral disorders: Secondary | ICD-10-CM | POA: Insufficient documentation

## 2011-06-27 DIAGNOSIS — M545 Low back pain, unspecified: Secondary | ICD-10-CM | POA: Insufficient documentation

## 2011-06-27 DIAGNOSIS — F1410 Cocaine abuse, uncomplicated: Secondary | ICD-10-CM | POA: Insufficient documentation

## 2011-06-27 DIAGNOSIS — F1010 Alcohol abuse, uncomplicated: Secondary | ICD-10-CM | POA: Insufficient documentation

## 2011-06-27 DIAGNOSIS — F319 Bipolar disorder, unspecified: Secondary | ICD-10-CM | POA: Insufficient documentation

## 2011-06-27 DIAGNOSIS — G40909 Epilepsy, unspecified, not intractable, without status epilepticus: Secondary | ICD-10-CM | POA: Insufficient documentation

## 2011-06-27 LAB — URINALYSIS, ROUTINE W REFLEX MICROSCOPIC
Hgb urine dipstick: NEGATIVE
Nitrite: NEGATIVE
Specific Gravity, Urine: 1.024 (ref 1.005–1.030)
Urobilinogen, UA: 0.2 mg/dL (ref 0.0–1.0)

## 2011-06-27 LAB — URINE MICROSCOPIC-ADD ON

## 2011-06-27 LAB — CBC
Hemoglobin: 13.4 g/dL (ref 12.0–15.0)
MCH: 29.5 pg (ref 26.0–34.0)
MCHC: 33.1 g/dL (ref 30.0–36.0)

## 2011-06-27 LAB — COMPREHENSIVE METABOLIC PANEL
ALT: 17 U/L (ref 0–35)
Calcium: 9 mg/dL (ref 8.4–10.5)
GFR calc Af Amer: >90 mL/min (ref >90)
Glucose, Bld: 107 mg/dL — ABNORMAL HIGH (ref 70–99)
Sodium: 141 mEq/L (ref 135–145)
Total Protein: 7 g/dL (ref 6.0–8.3)

## 2011-06-27 LAB — ETHANOL: Alcohol, Ethyl (B): <11 mg/dL (ref 0–11)

## 2011-06-27 LAB — RAPID URINE DRUG SCREEN, HOSP PERFORMED
Cocaine: POSITIVE — AB
Opiates: NOT DETECTED

## 2011-06-27 MED ORDER — ADULT MULTIVITAMIN W/MINERALS CH
1.0000 | ORAL_TABLET | Freq: Every day | ORAL | Status: DC
  Administered 2011-06-27: 1 via ORAL
  Filled 2011-06-27: qty 1

## 2011-06-27 MED ORDER — FOLIC ACID 1 MG PO TABS
1.0000 mg | ORAL_TABLET | Freq: Every day | ORAL | Status: DC
  Administered 2011-06-27: 1 mg via ORAL
  Filled 2011-06-27: qty 1

## 2011-06-27 MED ORDER — LORAZEPAM 1 MG PO TABS: 0.0000 mg | ORAL_TABLET | Freq: Two times a day (BID) | ORAL | Status: DC

## 2011-06-27 MED ORDER — VITAMIN B-1 100 MG PO TABS
100.0000 mg | ORAL_TABLET | Freq: Every day | ORAL | Status: DC
  Administered 2011-06-27: 100 mg via ORAL
  Filled 2011-06-27: qty 1

## 2011-06-27 MED ORDER — ALUM & MAG HYDROXIDE-SIMETH 200-200-20 MG/5ML PO SUSP: 30.0000 mL | ORAL | Status: DC | PRN

## 2011-06-27 MED ORDER — NICOTINE 21 MG/24HR TD PT24
21.0000 mg | MEDICATED_PATCH | Freq: Every day | TRANSDERMAL | Status: DC
  Administered 2011-06-28: 21 mg via TRANSDERMAL
  Filled 2011-06-27: qty 1

## 2011-06-27 MED ORDER — LORAZEPAM 2 MG/ML IJ SOLN: 1.0000 mg | Freq: Four times a day (QID) | INTRAMUSCULAR | Status: DC | PRN

## 2011-06-27 MED ORDER — IBUPROFEN 200 MG PO TABS
600.0000 mg | ORAL_TABLET | Freq: Three times a day (TID) | ORAL | Status: DC | PRN
  Administered 2011-06-27 – 2011-06-28 (×2): 600 mg via ORAL
  Filled 2011-06-27 (×2): qty 3

## 2011-06-27 MED ORDER — ONDANSETRON HCL 4 MG PO TABS: 4.0000 mg | ORAL_TABLET | Freq: Three times a day (TID) | ORAL | Status: DC | PRN

## 2011-06-27 MED ORDER — THIAMINE HCL 100 MG/ML IJ SOLN: 100.0000 mg | Freq: Every day | INTRAMUSCULAR | Status: DC

## 2011-06-27 MED ORDER — LORAZEPAM 1 MG PO TABS
0.0000 mg | ORAL_TABLET | Freq: Four times a day (QID) | ORAL | Status: DC
  Administered 2011-06-27: 1 mg via ORAL

## 2011-06-27 MED ORDER — LORAZEPAM 1 MG PO TABS
1.0000 mg | ORAL_TABLET | Freq: Four times a day (QID) | ORAL | Status: DC | PRN
  Filled 2011-06-27: qty 1

## 2011-06-27 NOTE — ED Notes (Signed)
Patient here requesting detox from etoh and crack. Reports that she lives in a boarding house and they give her drugs. Last use last pm. Denies suicidal thoughts

## 2011-06-27 NOTE — ED Provider Notes (Signed)
History     CSN: [id number]  Arrival date & time 06/27/11  1253   First MD Initiated Contact with Patient 06/27/11 1500      Chief Complaint  Patient presents with  . Medical Clearance    (Consider location/radiation/quality/duration/timing/severity/associated sxs/prior treatment) HPI Hx from patient. [name] is a 51 y.o. Female who presents after using crack and alcohol requesting detox. She was last seen here for the same on 3/3. She denies suicidal or homicidal ideation. She has right low back pain that is chronic. She denies urinary symptoms, nausea, vomiting, fever, chills, cough, chest pain. She states she last had her Risperdal injection while in the hospital. She was hospitalized 05/01/11; for cocaine and alcohol abuse, was detoxified with Librium protocol, and discharged to the community. Pt states she is currently living in a boarding house and states it is difficult to avoid drug use while living there. Last crack/etoh use was last evening.   Past Medical History  Diagnosis Date  . Arthritis   . Seizures   . Psychiatric illness   . Bipolar 1 disorder   . Schizo-affective schizophrenia   . Obesity     Past Surgical History  Procedure Date  . Stomach surgery     Family History  Problem Relation Age of Onset  . Alzheimer's disease Mother   . Depression Mother     History  Substance Use Topics  . Smoking status: Current Everyday Smoker -- 1.0 packs/day    Types: Cigarettes  . Smokeless tobacco: Never Used  . Alcohol Use: 3.6 oz/week    6 Cans of beer per week    OB History    Grav Para Term Preterm Abortions TAB SAB Ect Mult Living                  Review of Systems  All other systems reviewed and are negative.    Allergies  Review of patient's allergies indicates no known allergies.  Home Medications  No current outpatient prescriptions on file.  BP 140/103  Pulse 84  Temp(Src) 98.7 F (37.1 C) (Oral)  Resp 18  SpO2 98%  Physical  Exam  Nursing note and vitals reviewed. Constitutional: She is oriented to person, place, and time. She appears well-developed and well-nourished. No distress.  HENT:  Head: Normocephalic and atraumatic.       edentulous  Eyes: Conjunctivae and EOM are normal. Pupils are equal, round, and reactive to light.  Neck: Normal range of motion.  Cardiovascular: Normal rate, regular rhythm and normal heart sounds.   Pulmonary/Chest: Effort normal and breath sounds normal. She exhibits no tenderness.  Abdominal: Soft. Bowel sounds are normal. There is no tenderness. There is no rebound and no guarding.  Musculoskeletal:       R lumbar ttp, no palp crepitus, stepoff, deformity  Neurological: She is alert and oriented to person, place, and time.  Skin: Skin is warm and dry. She is not diaphoretic.  Psychiatric: She has a normal mood and affect.    ED Course  Procedures (including critical care time)  Labs Reviewed  COMPREHENSIVE METABOLIC PANEL - Abnormal; Notable for the following:    Glucose, Bld 107 (*)    Total Bilirubin 0.2 (*)    All other components within normal limits  URINALYSIS, ROUTINE W REFLEX MICROSCOPIC - Abnormal; Notable for the following:    APPearance TURBID (*)    Leukocytes, UA LARGE (*)    All other components within normal limits  URINE RAPID  DRUG SCREEN (HOSP PERFORMED) - Abnormal; Notable for the following:    Cocaine POSITIVE (*)    All other components within normal limits  URINE MICROSCOPIC-ADD ON - Abnormal; Notable for the following:    Squamous Epithelial / LPF FEW (*)    Bacteria, UA FEW (*)    All other components within normal limits  CBC  ETHANOL  POCT PREGNANCY, URINE  URINE CULTURE  URINE CULTURE  Large leukocytes likely 2/2 mucus  No results found.   No diagnosis found.    MDM  Polysubstance abuse - will move to psych ED. Medically clear. Have consulted [facility] who will see and make recs.      Grant Fontana, [place] 06/27/11 [phone]

## 2011-06-27 NOTE — ED Notes (Addendum)
Pt. C/o mid chest pain and SOB. Rating pain 10/10. Pt was placed on heart monitor and 2 L of O2 and EKG was performed. Pt. Appears to be in no distress. VS are WNL:P- 78, RR: 20 BP: 160/77 SPO2: 98%   MD aware of sx's. Will continue to monitor pt.

## 2011-06-27 NOTE — ED Notes (Signed)
Pt is washing up. 

## 2011-06-27 NOTE — ED Notes (Signed)
1mg  of ativan given for agitation-pt resting quietly-pt apologized for disruptive behavior-will continue to monitor

## 2011-06-27 NOTE — BH Assessment (Signed)
Assessment Note   [name] is an 51 y.o. female. Pt presents after using crack and alcohol requesting detox. She was hospitalized 05/01/11; for cocaine and alcohol abuse, was detoxified with Librium protocol, and discharged to the community. She was seen here for the same on 3/3 with similar complaints but discharged to follow up with out-pt. Patient returns stating, "I need help I can't leave here without help". She denies suicidal or homicidal ideation's.  She states she last had her Risperdal injection while in the hospital. Pt states she is currently living in a boarding house and states it is difficult to avoid drug use while living there. Last crack/etoh use was yesterday. She reports on-going use since age 71.      Axis I: Alcohol Dependence; Cocaine Dependence; Mood Disorder NOS Axis II: Deferred Axis III:  Past Medical History  Diagnosis Date  . Arthritis   . Seizures   . Psychiatric illness   . Bipolar 1 disorder   . Schizo-affective schizophrenia   . Obesity    Axis IV: economic problems, educational problems, housing problems, occupational problems, other psychosocial or environmental problems, problems related to social environment, problems with access to health care services and problems with primary support group Axis V: 51-60 moderate symptoms  Past Medical History:  Past Medical History  Diagnosis Date  . Arthritis   . Seizures   . Psychiatric illness   . Bipolar 1 disorder   . Schizo-affective schizophrenia   . Obesity     Past Surgical History  Procedure Date  . Stomach surgery     Family History:  Family History  Problem Relation Age of Onset  . Alzheimer's disease Mother   . Depression Mother     Social History:  reports that she has been smoking Cigarettes.  She has been smoking about 1 pack per day. She has never used smokeless tobacco. She reports that she drinks about 3.6 ounces of alcohol per week. She reports that she uses illicit drugs  ("Crack" cocaine) about 5 times per week.  Additional Social History:  Alcohol / Drug Use Pain Medications: None reported Prescriptions: None reported Over the Counter: None reported History of alcohol / drug use?: Yes Longest period of sobriety (when/how long): unknown Substance #1 Name of Substance 1: Crack/cocaine 1 - Age of First Use: 17 1 - Amount (size/oz): varies 1 - Frequency: daily 1 - Duration: years 1 - Last Use / Amount: 06/28/2011; 8 balls Substance #2 Name of Substance 2: Alcohol 2 - Age of First Use: 17 2 - Amount (size/oz): "anything I can get my hands on" 2 - Frequency: daly 2 - Duration: several yrs 2 - Last Use / Amount: 05/14/11; fifth of gin Allergies: No Known Allergies  Home Medications:  Medications Prior to Admission  Medication Dose Route Frequency Provider Last Rate Last Dose  . alum & mag hydroxide-simeth (MAALOX/MYLANTA) 200-200-20 MG/5ML suspension 30 mL  30 mL Oral PRN Grant Fontana, PA      . folic acid (FOLVITE) tablet 1 mg  1 mg Oral Daily Grant Fontana, [place]   1 mg at 06/27/11 1836  . ibuprofen (ADVIL,MOTRIN) tablet 600 mg  600 mg Oral Q8H PRN Grant Fontana, PA   600 mg at 06/27/11 1608  . LORazepam (ATIVAN) tablet 1 mg  1 mg Oral Q6H PRN Grant Fontana, PA       Or  . LORazepam (ATIVAN) injection 1 mg  1 mg Intravenous Q6H PRN Grant Fontana, PA      .  LORazepam (ATIVAN) tablet 0-4 mg  0-4 mg Oral Q6H Grant Fontana, [place]   1 mg at 06/27/11 1607   Followed by  . LORazepam (ATIVAN) tablet 0-4 mg  0-4 mg Oral Q12H Grant Fontana, [place]      . mulitivitamin with minerals tablet 1 tablet  1 tablet Oral Daily Grant Fontana, [place]   1 tablet at 06/27/11 1836  . nicotine (NICODERM CQ - dosed in mg/24 hours) patch 21 mg  21 mg Transdermal Daily Grant Fontana, [place]      . ondansetron [facility]) tablet 4 mg  4 mg Oral Q8H PRN Grant Fontana, PA      . thiamine (VITAMIN B-1) tablet 100 mg  100 mg Oral Daily Grant Fontana, [place]   100 mg at 06/27/11 1835   Or  . thiamine (B-1) injection 100 mg  100 mg Intravenous Daily Grant Fontana, [place]       No current outpatient prescriptions on file as of 06/27/2011.    OB/GYN Status:  No LMP recorded.  General Assessment Data Location of Assessment: [facility] Living Arrangements: Other (Comment) (Boarding house) Can pt return to current living arrangement?: Yes Admission Status: Voluntary Is patient capable of signing voluntary admission?: Yes Transfer from: Acute Hospital Referral Source: Self/Family/Friend     Risk to self Suicidal Ideation: No Suicidal Intent: No Is patient at risk for suicide?: No Suicidal Plan?: No Access to Means: No Specify Access to Suicidal Means:  (Access to streets) What has been your use of drugs/alcohol within the last 12 months?:  (alcohol, crack, cocaine) Previous Attempts/Gestures: No How many times?:  (0) Other Self Harm Risks:  (None Reported) Triggers for Past Attempts: None known Intentional Self Injurious Behavior: None Family Suicide History: No Recent stressful life event(s):  (use of illicit drugs and relapse) Persecutory voices/beliefs?: No Depression: No Depression Symptoms: Loss of interest in usual pleasures Substance abuse history and/or treatment for substance abuse?: Yes Suicide prevention information given to non-admitted patients: Not applicable  Risk to Others Homicidal Ideation: No Thoughts of Harm to Others: No Current Homicidal Intent: No Current Homicidal Plan: No Access to Homicidal Means: No Identified Victim:  (n/a) History of harm to others?: No Assessment of Violence: None Noted Violent Behavior Description:  (n/a) Does patient have access to weapons?: No Criminal Charges Pending?: No Does patient have a court date: No  Psychosis Hallucinations: None noted Delusions: None noted  Mental Status Report Appear/Hygiene: Disheveled Eye Contact: Good Motor Activity:  Agitation Speech: Logical/coherent Level of Consciousness: Alert;Restless Mood: Anxious Affect: Appropriate to circumstance Anxiety Level: None Thought Processes: Coherent Judgement: Impaired Orientation: Person;Place;Time;Situation Obsessive Compulsive Thoughts/Behaviors: None  Cognitive Functioning Concentration: Normal Memory: Recent Intact;Remote Intact IQ: Average Insight: Fair Impulse Control: Poor Appetite: Fair Weight Loss:  (0) Weight Gain:  (0) Sleep: No Change Total Hours of Sleep:  (5 hours) Vegetative Symptoms: None  Prior Inpatient Therapy Prior Inpatient Therapy: Yes Prior Therapy Dates: 2012 2013 Prior Therapy Facilty/Provider(s): [facility], [facility], [facility] Reason for Treatment: Detox  Prior Outpatient Therapy Prior Outpatient Therapy: Yes Prior Therapy Dates: current Prior Therapy Facilty/Provider(s): [facility] Reason for Treatment: Psych  ADL Screening (condition at time of admission) Patient's cognitive ability adequate to safely complete daily activities?: Yes Patient able to express need for assistance with ADLs?: Yes Independently performs ADLs?: Yes Weakness of Legs: None Weakness of Arms/Hands: None  Home Assistive Devices/Equipment Home Assistive Devices/Equipment: None    Abuse/Neglect Assessment (Assessment to be complete while patient is alone) Physical  Abuse: Denies Verbal Abuse: Denies Sexual Abuse: Denies Exploitation of patient/patient's resources: Denies Self-Neglect: Denies Values / Beliefs Cultural Requests During Hospitalization: None Spiritual Requests During Hospitalization: None   Advance Directives (For Healthcare) Advance Directive: Patient does not have advance directive Nutrition Screen Diet: Regular Unintentional weight loss greater than 10lbs within the last month: No Problems chewing or swallowing foods and/or liquids: No Home Tube Feeding or Total Parenteral Nutrition (TPN): No Patient appears severely  malnourished: No Pregnant or Lactating: No  Additional Information 1:1 In Past 12 Months?: No CIRT Risk: No Elopement Risk: No Does patient have medical clearance?: Yes     Disposition:  Disposition Disposition of Patient: Referred to Type of inpatient treatment program: Adult Patient referred to:  [facility] (no beds @ this time); pending alt. placement)  On Site Evaluation by:   Reviewed with Physician:     Melynda Ripple [facility] 06/27/2011 7:55 PM

## 2011-06-27 NOTE — ED Provider Notes (Signed)
Medical screening examination/treatment/procedure(s) were performed by non-physician practitioner and as supervising physician I was immediately available for consultation/collaboration.  Ethelda Chick, MD 06/27/11 [phone]

## 2011-06-27 NOTE — ED Notes (Addendum)
Pt. Refused to keep heart monitor on. STRONGLY request that the heart monitor be removed even after explaining to her the purpose being placed on the monitor.

## 2011-06-28 ENCOUNTER — Inpatient Hospital Stay (HOSPITAL_COMMUNITY)
Admission: AD | Admit: 2011-06-28 | Discharge: 2011-07-01 | DRG: 897 | Disposition: A | Discharge status: Home / Self Care | Payer: Medicare Other | Source: Ambulatory Visit | Attending: Psychiatry | Admitting: Psychiatry

## 2011-06-28 DIAGNOSIS — F203 Undifferentiated schizophrenia: Secondary | ICD-10-CM | POA: Diagnosis present

## 2011-06-28 DIAGNOSIS — F1410 Cocaine abuse, uncomplicated: Secondary | ICD-10-CM | POA: Diagnosis present

## 2011-06-28 DIAGNOSIS — F17200 Nicotine dependence, unspecified, uncomplicated: Secondary | ICD-10-CM | POA: Diagnosis present

## 2011-06-28 DIAGNOSIS — F209 Schizophrenia, unspecified: Secondary | ICD-10-CM | POA: Diagnosis present

## 2011-06-28 DIAGNOSIS — M129 Arthropathy, unspecified: Secondary | ICD-10-CM | POA: Diagnosis present

## 2011-06-28 DIAGNOSIS — I10 Essential (primary) hypertension: Secondary | ICD-10-CM | POA: Diagnosis present

## 2011-06-28 DIAGNOSIS — Z9119 Patient's noncompliance with other medical treatment and regimen: Secondary | ICD-10-CM

## 2011-06-28 DIAGNOSIS — Z91199 Patient's noncompliance with other medical treatment and regimen due to unspecified reason: Secondary | ICD-10-CM

## 2011-06-28 DIAGNOSIS — N390 Urinary tract infection, site not specified: Secondary | ICD-10-CM | POA: Diagnosis present

## 2011-06-28 DIAGNOSIS — F1020 Alcohol dependence, uncomplicated: Principal | ICD-10-CM | POA: Diagnosis present

## 2011-06-28 LAB — URINALYSIS, ROUTINE W REFLEX MICROSCOPIC
Glucose, UA: NEGATIVE mg/dL
Nitrite: NEGATIVE
Protein, ur: NEGATIVE mg/dL
Urobilinogen, UA: 0.2 mg/dL (ref 0.0–1.0)

## 2011-06-28 LAB — URINE MICROSCOPIC-ADD ON

## 2011-06-28 MED ORDER — CHLORDIAZEPOXIDE HCL 25 MG PO CAPS
25.0000 mg | ORAL_CAPSULE | ORAL | Status: DC | PRN
  Administered 2011-06-28 – 2011-06-29 (×2): 25 mg via ORAL
  Filled 2011-06-28 (×2): qty 1

## 2011-06-28 MED ORDER — ALUM & MAG HYDROXIDE-SIMETH 200-200-20 MG/5ML PO SUSP
30.0000 mL | ORAL | Status: DC | PRN
  Administered 2011-06-30: 30 mL via ORAL

## 2011-06-28 MED ORDER — TRAZODONE HCL 50 MG PO TABS
50.0000 mg | ORAL_TABLET | Freq: Every evening | ORAL | Status: DC | PRN
  Administered 2011-06-28 – 2011-06-30 (×2): 50 mg via ORAL
  Filled 2011-06-28 (×2): qty 1

## 2011-06-28 MED ORDER — MAGNESIUM HYDROXIDE 400 MG/5ML PO SUSP: 30.0000 mL | Freq: Every day | ORAL | Status: DC | PRN

## 2011-06-28 NOTE — ED Notes (Signed)
Patient is resting comfortably. 

## 2011-06-28 NOTE — Progress Notes (Signed)
Patient came to med window at bedtime for her HS medications. Her Naproxen strength ordered by physician not available in the [facility]. Pharmacist changed the strength to 500 mg. Pt cooperative, denied SI/HI and denied withdrawal symptoms. Mood and affect somewhat appropriate but bizarre. Pt took her HS medications without difficulty.

## 2011-06-28 NOTE — H&P (Signed)
  Psychiatric Admission Assessment Adult  Patient Identification:  [name] Date of Evaluation:  06/28/2011 50yo DAAF CC: I need detox-again History of Present Illness: Hates her boarding house. Likes the beds here and being able to shower. Was last inpatient here 2/17-04/19/11 also presented to ED 3/3 but not admitted here at that time.  Long history for alcohol and cocaine abuse. Is followed by [facility] and is usually compliant with depot injections. When last discharged was on Risperdal Consta 50 mg IM Q14 days last received here 05/06/11.    Past Psychiatric History: Started drinking as a teen and since we have known her is frequently +for cocaine.  Numerous prior detoxes as well as outpatient treatment for schizoaffective disorder.   Social History:    reports that she has been smoking Cigarettes.  She has been smoking about 1 pack per day. She has never used smokeless tobacco. She reports that she drinks about 3.6 ounces of alcohol per week. She reports that she uses illicit drugs ("Crack" cocaine) about 5 times per week. Went to 11th grade then went to [employer]. Married and divorced once. Has a son 73 and daughter 29 . Reports she didn't drink or drug while pregnant and was  a very good mother. Believes she began receiving disability in her 24 's.   Family Psych History: Denies   Past Medical History:     Past Medical History  Diagnosis Date  . Arthritis   . Seizures   . Psychiatric illness   . Bipolar 1 disorder   . Schizo-affective schizophrenia   . Obesity        Past Surgical History  Procedure Date  . Stomach surgery   November 2009 Exp lap due to pelvic abscess requiring a myomectomy.   Allergies: No Known Allergies  Current Medications:  Prior to Admission medications   Not on File    Mental Status Examination/Evaluation: Objective:  Appearance: Fairly Groomed showered in ED   Psychomotor Activity:  Normal  Eye Contact::  Good  Speech:   Clear and Coherent  Volume:  Normal  Mood: good -does not exhibit withdrawal - smiles as she remembers me    Affect:  Full Range  Thought Process:  Clear rational goal oriented- get a new place to stay   Orientation:  Full  Thought Content:  No AVH or psychosis   Suicidal Thoughts:  No  Homicidal Thoughts:  No  Judgement:  Impaired  Insight:  Shallow    DIAGNOSIS:    AXIS I Alcohol Abuse Cocaine abuse Schizoaffective DO   AXIS II Borderline IQ  AXIS III See medical history.  AXIS IV housing problems and other psychosocial or environmental problems  AXIS V 51-60 moderate symptoms     Treatment Plan Summary: Admit for safety and stabilization. Librium 25mg  Q4H prn until am then reconsider  Call [facility] [phone] in am find out date of last Risperdal Consta she requests our case manager speak to Joaquin regarding alternative placement. Agree with H&P from ED   Will recheck [facility] to see if she needs treatment for a UTI.

## 2011-06-28 NOTE — Tx Team (Signed)
Initial Interdisciplinary Treatment Plan  PATIENT STRENGTHS: (choose at least two) Communication skills General fund of knowledge Supportive family/friends  PATIENT STRESSORS: Substance abuse   PROBLEM LIST: Problem List/Patient Goals Date to be addressed Date deferred Reason deferred Estimated date of resolution  Substance Abuse 06/28/11     Medication Administration 06/28/11                                                DISCHARGE CRITERIA:  Ability to meet basic life and health needs Withdrawal symptoms are absent or subacute and managed without 24-hour nursing intervention  PRELIMINARY DISCHARGE PLAN: Attend aftercare/continuing care group Attend 12-step recovery group Placement in alternative living arrangements  PATIENT/FAMIILY INVOLVEMENT: This treatment plan has been presented to and reviewed with the patient, [name], and/or family member.  The patient and family have been given the opportunity to ask questions and make suggestions.  [name] 06/28/2011, 4:43 PM

## 2011-06-28 NOTE — ED Notes (Signed)
Pt. Voiced concern about not wanting to return to her room at her place of residency. She stated that she lives in a boarding house. She has concerns that if she returned to that environment she will relapse. She repeatedly stated that she is trying to get out of this lifestyle.

## 2011-06-28 NOTE — BH Assessment (Signed)
Patient accepted to [facility] by Dr. Wallis Mart to Dr. Javier Glazier. EDP-Dr. Juleen China made aware of patients disposition and agrees to discharge patient. Patients nurse made aware. Support paperwork completed. Patient transported to [facility] accordingly.

## 2011-06-28 NOTE — Progress Notes (Signed)
Patient ID: [name], female   DOB: 01/05/1961, 51 y.o.   MRN: [id number] Pt is voluntary. Pt is here because wants to detox off ETOH and crack. Pt is currently staying in a crack house and would like to go somewhere else when she leaves here. Pt is in a good mood and states she really wants help. Pt denies SI/HI.

## 2011-06-29 DIAGNOSIS — I10 Essential (primary) hypertension: Secondary | ICD-10-CM | POA: Diagnosis present

## 2011-06-29 DIAGNOSIS — Z9119 Patient's noncompliance with other medical treatment and regimen: Secondary | ICD-10-CM

## 2011-06-29 LAB — URINE CULTURE

## 2011-06-29 MED ORDER — DIVALPROEX SODIUM ER 500 MG PO TB24
1000.0000 mg | ORAL_TABLET | Freq: Every day | ORAL | Status: DC
  Administered 2011-06-29 – 2011-06-30 (×2): 1000 mg via ORAL
  Filled 2011-06-29 (×4): qty 2

## 2011-06-29 MED ORDER — QUETIAPINE FUMARATE 50 MG PO TABS
50.0000 mg | ORAL_TABLET | Freq: Every day | ORAL | Status: DC
  Administered 2011-06-29 – 2011-06-30 (×2): 50 mg via ORAL
  Filled 2011-06-29 (×3): qty 1
  Filled 2011-06-29: qty 7

## 2011-06-29 MED ORDER — ONDANSETRON HCL 4 MG PO TABS
4.0000 mg | ORAL_TABLET | Freq: Three times a day (TID) | ORAL | Status: DC | PRN
  Administered 2011-06-29: 4 mg via ORAL
  Filled 2011-06-29: qty 1

## 2011-06-29 MED ORDER — RISPERIDONE 3 MG PO TABS
3.0000 mg | ORAL_TABLET | Freq: Two times a day (BID) | ORAL | Status: DC
  Administered 2011-06-29 – 2011-07-01 (×4): 3 mg via ORAL
  Filled 2011-06-29: qty 14
  Filled 2011-06-29 (×2): qty 1
  Filled 2011-06-29: qty 14
  Filled 2011-06-29 (×4): qty 1

## 2011-06-29 MED ORDER — SULFAMETHOXAZOLE-TMP DS 800-160 MG PO TABS
1.0000 | ORAL_TABLET | Freq: Two times a day (BID) | ORAL | Status: DC
  Administered 2011-06-29 – 2011-07-01 (×4): 1 via ORAL
  Filled 2011-06-29 (×3): qty 1
  Filled 2011-06-29: qty 11
  Filled 2011-06-29: qty 1
  Filled 2011-06-29: qty 11
  Filled 2011-06-29 (×2): qty 1

## 2011-06-29 MED ORDER — DIVALPROEX SODIUM ER 500 MG PO TB24
500.0000 mg | ORAL_TABLET | ORAL | Status: DC
  Administered 2011-06-30 – 2011-07-01 (×2): 500 mg via ORAL
  Filled 2011-06-29 (×3): qty 1
  Filled 2011-06-29: qty 21

## 2011-06-29 MED ORDER — RISPERIDONE MICROSPHERES 50 MG IM SUSR
50.0000 mg | INTRAMUSCULAR | Status: DC
  Administered 2011-07-01: 50 mg via INTRAMUSCULAR
  Filled 2011-06-29 (×2): qty 2

## 2011-06-29 MED ORDER — IBUPROFEN 400 MG PO TABS
400.0000 mg | ORAL_TABLET | Freq: Four times a day (QID) | ORAL | Status: DC | PRN
  Administered 2011-06-29 – 2011-07-01 (×4): 400 mg via ORAL
  Filled 2011-06-29: qty 1
  Filled 2011-06-29: qty 2
  Filled 2011-06-29 (×2): qty 1

## 2011-06-29 MED ORDER — LISINOPRIL 10 MG PO TABS
10.0000 mg | ORAL_TABLET | Freq: Every day | ORAL | Status: DC
  Administered 2011-06-29 – 2011-07-01 (×3): 10 mg via ORAL
  Filled 2011-06-29: qty 7
  Filled 2011-06-29 (×5): qty 1

## 2011-06-29 MED ORDER — BENZTROPINE MESYLATE 0.5 MG PO TABS
0.5000 mg | ORAL_TABLET | Freq: Every day | ORAL | Status: DC
  Administered 2011-06-29 – 2011-06-30 (×2): 0.5 mg via ORAL
  Filled 2011-06-29 (×2): qty 1
  Filled 2011-06-29: qty 7
  Filled 2011-06-29: qty 1

## 2011-06-29 MED ORDER — RISPERIDONE MICROSPHERES 50 MG IM SUSR
50.0000 mg | INTRAMUSCULAR | Status: DC
  Filled 2011-06-29: qty 2

## 2011-06-29 MED ORDER — RISPERIDONE MICROSPHERES 50 MG IM SUSR: 50.0000 mg | INTRAMUSCULAR | Status: DC

## 2011-06-29 NOTE — Progress Notes (Signed)
[facility] Group Notes:  (Counselor/Nursing/MHT/Case Management/Adjunct)  06/29/2011 12:01 PM  Type of Therapy:  Group Therapy  Participation Level:  Did Not Attend  [name] 06/29/2011, 12:01 PM

## 2011-06-29 NOTE — Discharge Planning (Signed)
Pt present in group. Pt needed constant redirection from CM due to her inappropriate statements during the group. This is one of many admissions for pt., most recent admission in February. Pt states that during her last admission in February, she was given an appointment at [facility] but did not go to and has been drinking daily ever since she was d/c. Pt states that although she missed her last appointment at [facility], it is still an option she would like to explore.

## 2011-06-29 NOTE — [facility] Suicide Risk Assessment (Signed)
Suicide Risk Assessment   Demographic factors: See chart.    Current Mental Status: Patient seen and evaluated. Chart reviewed. Patient stated that her mood was "not good". Her affect was mood congruent and irritable. She denied any current thoughts of self injurious behavior, suicidal ideation or homicidal ideation. Report of AH; denied visual hallucinations, delusional thought processes, s/s of mania.  Thought process was tangential.  Speech was increased rate, tone and volume. Eye contact was good. Judgment and insight are limited.  No safety concerns reported from team.  Loss Factors: Financial problems / change in socioeconomic status   Historical Factors: Impulsivity; med noncompliance; episodic SI reported per team; multiple past psychiatric admissions; poor compliance with po meds only - complies with IM Tx   Risk Reduction Factors: Sense of responsibility to family; f/u with ACT team   CLINICAL FACTORS: Alcohol and Cocaine Dependence; Psychotic Disorder NOS   COGNITIVE FEATURES THAT CONTRIBUTE TO RISK: thought constriction (tunnel vision); limited insight.   SUICIDE RISK: Pt viewed as a chronic moderate increased risk of harm to self in light of her past hx and risk factors.  No acute safety concerns on the unit.  Pt contracting for safety on unit.  Plan: Pt admitted for crisis stabilization and treatment.  Needs to be restarted on meds if she will take them and continued on IM Risperdal.  Will continue q15 minute checks per unit protocol.  No clinical indication for one on one level of observation at this time.  Pt contracting for safety.  Mental health treatment, medication management and continued sobriety will mitigate against the increased risk of harm to self and/or others.  Discussed the importance of recovery with pt, as well as, tools to move forward in a healthy & safe manner.  Pt agreeable with the plan.  Discussed with the team.  400 hall recommended and plan to transfer pt  as bed becomes available.  Lupe Carney 06/29/2011, 11:40 AM

## 2011-06-29 NOTE — Progress Notes (Signed)
[facility] Group Notes:  (Counselor/Nursing/MHT/Case Management/Adjunct)  06/29/2011 2:13 PM  Type of Therapy:  Psychoeducational Skills  Participation Level:  Did Not Attend      [name] 06/29/2011, 2:13 PM

## 2011-06-29 NOTE — Progress Notes (Signed)
Adult Psychosocial Assessment Update Interdisciplinary Team  [facility] admissions/discharges:  Admissions Discharges  Date: 05/01/2011 Date: 05/09/2011  Date: 04/02/2011 Date: 04/05/2011  Date: 11/17/2010 Date: 11/24/2010  Date: Date:  Date: Date:   Changes since the last Psychosocial Assessment (including adherence to outpatient mental health and/or substance abuse treatment, situational issues contributing to decompensation and/or relapse). Patient reports she relapsed into drinking and smoking crack cocaine..."getting twisted".  Patient states she has been hanging with the wrong people. Patient reports she has not  Been complying with her medications by not taking them as directed.         Discharge Plan 1. Will you be returning to the same living situation after discharge?   Yes: No: X     If no, what is your plan?    Patient reports she does not want to return to the same living situation. Patient states   "I want to get out of that crack house". Patient reports she wants to move to [facility],  [place] with her children.   2. Would you like a referral for services when you are discharged? Yes:  X   If yes, for what services?  No:       Patient reports she did not attend her last follow up appointment because she "received   Her check when she discharged". Patient reports she is not satisfied with the services  She is receiving from her ACT Team ([facility]).   Summary and Recommendations (to be completed by the evaluator) Patient is a 51 year old female. Patient admitted with diagnosis of Alcohol Dependence;  Cocaine Dependence; Mood Disorder NOS. Patient reports she is seeking detox   Treatment from alcohol and substances. Patient would benefit from crisis stabilization,  Medication evaluation, psycho ed and group therapy, and case management for   Discharge planning.               Signature:  Wilmon Arms, 06/29/2011 12:02 PM

## 2011-06-29 NOTE — Progress Notes (Signed)
Pt has not been to any groups except one and she left out before it was completed.  She came back from breakfast and complained of n/v but no vomitus noted.  She was ordered zofran and was given at [phone] which she claimed helped and then a bit later stated,"no, it didn't"  She has been loud, cursing and demanding at times.  She rated all, her depression hopelessness and anxiety a 10 today on her self-inventory.  She denied any symptoms of withdrawal, S/H ideations or a hallucinations.   However, she did admit to visual hallucinations but would not elaborate on what she was actually seeing.  She kept stating, "I see things yeah I do see things"  She went down for lunch and was wanting to be allowed to stay off the 300 hall to use the phone.  She did take the redirection to come back to the unit.   She had an order to move to 400 hall when bed available which she was moved around 1330.  She had c/o back pain so an order for Ibuprofen was given by PA.  Pt took around 1338 and went back to sleep.  She did sleep most of the afternoon.  She did wake for her 1700 medications and went back to sleep.  She wants assistance in finding her housing for she does not want to return to her previous place.  She claims it is "infested by drugs"

## 2011-06-29 NOTE — Progress Notes (Signed)
Pt came to the nurses station informing the writer that she had some pain "all thru here", pointing and rubbing both sides, back and abd. Pt's vital signs were taken and were 137/93, 90, 97.4. Pt was loud and incoherent at times. Writer gave pt prn librium, and asked her to lower her voice. Pt stated loudly, "Everybody needs to get up. All they do is smoke crack, then sleep". Writer informed the pt that it was only 0430, and not time to get up. Instructed the pt to go back to bed and attempt to get some rest.

## 2011-06-29 NOTE — H&P (Signed)
Pt seen and evaluated on 06/29/11.  Completed Admission Suicide Risk Assessment.  See orders.  Pt agreeable with plan.  Discussed with treatment team.  May transition to [address] if bed becomes available.  Collateral pending.

## 2011-06-30 DIAGNOSIS — F203 Undifferentiated schizophrenia: Secondary | ICD-10-CM | POA: Diagnosis present

## 2011-06-30 DIAGNOSIS — F1010 Alcohol abuse, uncomplicated: Secondary | ICD-10-CM

## 2011-06-30 DIAGNOSIS — F1410 Cocaine abuse, uncomplicated: Secondary | ICD-10-CM | POA: Diagnosis present

## 2011-06-30 DIAGNOSIS — F209 Schizophrenia, unspecified: Secondary | ICD-10-CM

## 2011-06-30 DIAGNOSIS — F1420 Cocaine dependence, uncomplicated: Secondary | ICD-10-CM

## 2011-06-30 NOTE — Progress Notes (Signed)
Pt. Has been up & about on the unit. Affect is blunted & mood depressed. Pt.was medicated with ibuprofen for c/o low back pain. Denies SI,HI, & AVH. Continues on 15 minute checks. Pt. Safety maintained

## 2011-06-30 NOTE — Progress Notes (Signed)
[facility] MD Progress Note  06/30/2011 4:45 PM  Diagnosis:  Axis I:  Alcohol Dependence.  Cocaine Abuse.  Schizophrenia - Undifferentiated Type.   The patient was seen today and reports the following:   ADL's: Intact.  Sleep: The patient reports to sleeping well without difficulty.  Appetite: The patient reports a good appetite.   Mild>(1-10) >Severe  Hopelessness (1-10): 0  Depression (1-10): 0  Anxiety (1-10): 0   Suicidal Ideation: The patient adamantly denies any suicidal ideations today.  Plan: No  Intent: No  Means: No   Homicidal Ideation: The patient adamantly denies any homicidal ideations today.  Plan: No  Intent: No.  Means: No   General Appearance /Behavior: Cooperative with this physician and somewhat disshelved.  Eye Contact: Good.  Speech: Appropriate in rate and volume with no pressuring noted.  Motor Behavior: Appropriate.  Level of Consciousness: Alert and Oriented x 3.  Mental Status: Alert and Oriented x 3.  Mood: Essentially Euthymic.  Affect: Full.  Anxiety Level: None noted or reported.  Thought Process: wnl.  Thought Content: The patient denies any auditory hallucinations as well as any visual hallucinations or delusional thinking today.  Perception:. wnl.  Judgment: Fair.  Insight: Fair.  Cognition: Oriented to time, place and person.  Sleep:  Number of Hours: 6.5    Vital Signs:Blood pressure 141/100, pulse 95, temperature 97.5 F (36.4 C), temperature source Oral, resp. rate 20, height 5\' 6"  (1.676 m), weight 96.616 kg (213 lb), SpO2 98.00%.  Current Medications: Current Facility-Administered Medications  Medication Dose Route Frequency Provider Last Rate Last Dose  . alum & mag hydroxide-simeth (MAALOX/MYLANTA) 200-200-20 MG/5ML suspension 30 mL  30 mL Oral Q4H PRN Viviann Spare, NP      . benztropine (COGENTIN) tablet 0.5 mg  0.5 mg Oral QHS Verne Spurr, PA-C   0.5 mg at 06/29/11 2137  . chlordiazePOXIDE (LIBRIUM) capsule 25 mg  25 mg  Oral Q4H PRN Viviann Spare, NP   25 mg at 06/29/11 0417  . divalproex (DEPAKOTE ER) 24 hr tablet 1,000 mg  1,000 mg Oral QHS Verne Spurr, PA-C   1,000 mg at 06/29/11 2138  . divalproex (DEPAKOTE ER) 24 hr tablet 500 mg  500 mg Oral BH-q7a Neil Mashburn, PA-C   500 mg at 06/30/11 4098  . ibuprofen (ADVIL,MOTRIN) tablet 400 mg  400 mg Oral Q6H PRN Verne Spurr, PA-C   400 mg at 06/30/11 1605  . lisinopril (PRINIVIL,ZESTRIL) tablet 10 mg  10 mg Oral Daily Verne Spurr, PA-C   10 mg at 06/30/11 0818  . magnesium hydroxide (MILK OF MAGNESIA) suspension 30 mL  30 mL Oral Daily PRN Viviann Spare, NP      . ondansetron [facility] tablet 4 mg  4 mg Oral Q8H PRN Verne Spurr, PA-C   4 mg at 06/29/11 0839  . QUEtiapine (SEROQUEL) tablet 50 mg  50 mg Oral QHS Verne Spurr, PA-C   50 mg at 06/29/11 2137  . risperiDONE (RISPERDAL) tablet 3 mg  3 mg Oral BID Verne Spurr, PA-C   3 mg at 06/30/11 1641  . risperiDONE microspheres (RISPERDAL CONSTA) injection 50 mg  50 mg Intramuscular Q14 Days Alyson Kuroski-Mazzei, DO      . sulfamethoxazole-trimethoprim (BACTRIM DS) 800-160 MG per tablet 1 tablet  1 tablet Oral Q12H Verne Spurr, PA-C   1 tablet at 06/30/11 0818  . traZODone (DESYREL) tablet 50 mg  50 mg Oral QHS PRN Viviann Spare, NP   50 mg  at 06/28/11 2119   Lab Results:  Results for orders placed during the hospital encounter of 06/28/11 (from the past 48 hour(s))  URINE CULTURE     Status: Normal   Collection Time   06/28/11  6:59 PM      Component Value Range Comment   Specimen Description URINE, CLEAN CATCH      Special Requests NONE      Culture  Setup Time [id number]      Colony Count >=100,000 COLONIES/ML      Culture        Value: Multiple bacterial morphotypes present, none predominant. Suggest appropriate recollection if clinically indicated.   Report Status 06/29/2011 FINAL     GC/CHLAMYDIA PROBE AMP, URINE     Status: Normal   Collection Time   06/28/11  6:59 PM       Component Value Range Comment   GC Probe Amp, Urine NEGATIVE  NEGATIVE     Chlamydia, Swab/Urine, PCR NEGATIVE  NEGATIVE    URINALYSIS, ROUTINE W REFLEX MICROSCOPIC     Status: Abnormal   Collection Time   06/28/11  7:59 PM      Component Value Range Comment   Color, Urine YELLOW  YELLOW     APPearance TURBID (*) CLEAR     Specific Gravity, Urine 1.021  1.005 - 1.030     pH 6.0  5.0 - 8.0     Glucose, UA NEGATIVE  NEGATIVE (mg/dL)    Hgb urine dipstick NEGATIVE  NEGATIVE     Bilirubin Urine NEGATIVE  NEGATIVE     Ketones, ur NEGATIVE  NEGATIVE (mg/dL)    Protein, ur NEGATIVE  NEGATIVE (mg/dL)    Urobilinogen, UA 0.2  0.0 - 1.0 (mg/dL)    Nitrite NEGATIVE  NEGATIVE     Leukocytes, UA LARGE (*) NEGATIVE    URINE MICROSCOPIC-ADD ON     Status: Abnormal   Collection Time   06/28/11  7:59 PM      Component Value Range Comment   Squamous Epithelial / LPF MANY (*) RARE     WBC, UA 3-6  <3 (WBC/hpf)    Bacteria, UA MANY (*) RARE     Casts GRANULAR CAST (*) NEGATIVE    VALPROIC ACID LEVEL     Status: Abnormal   Collection Time   06/29/11  8:04 PM      Component Value Range Comment   Valproic Acid Lvl <10.0 (*) 50.0 - 100.0 (ug/mL)    Time was spent today discussing with the patient the reason for admission.  The patient states that she was "drinking too much alcohol and felt she needed to be detoxed."  The patient reports to sleeping well and reports a good appetite.  She also denies any significant depressive symptoms as well as any auditory or visual hallucinations or delusional thinking.  She denies any symptoms of alcohol withdrawal today and states she may be ready for discharge soon after a date is arranged for her to enter [facility] for further treatment of her substance abuse issues.  She also states that she is dissatisfied with her current living arrangement and is interested in assistance in finding new placement.  Treatment Plan Summary:  1. Daily contact with patient to assess and  evaluate symptoms and progress in treatment  2. Medication management  3. The patient will deny suicidal ideations or homicidal ideations for 48 hours prior to discharge and have a depression and anxiety rating of 3 or less. The patient will  also deny any auditory or visual hallucinations or delusional thinking.  4. The patient will report no symptoms of   Plan:  1. Will continue current medications.  2. Will continue to monitor.  3. Laboratory studies reviewed. 4. The patient is due for a Risperdal Consta Injection 50 mgs IM q 14 days tomorrow. 5. The patient's ACT team will visit tomorrow with possible discharge afterward.  Tommey Barret 06/30/2011, 4:45 PM

## 2011-06-30 NOTE — Treatment Plan (Signed)
Interdisciplinary Treatment Plan Update (Adult)  Date: 06/30/2011  Time Reviewed: 8:25 AM   Progress in Treatment: Attending groups: Yes Participating in groups: Yes Taking medication as prescribed: Yes Tolerating medication: Yes   Family/Significant othe contact made:   Patient understands diagnosis:  Yes Discussing patient identified problems/goals with staff:  Yes Medical problems stabilized or resolved:  Yes Denies suicidal/homicidal ideation: Yes Issues/concerns per patient self-inventory:  Yes Other:  New problem(s) identified: N/A  Reason for Continuation of Hospitalization: Medication stabilization  Interventions implemented related to continuation of hospitalization:  Medication monitoring and adjustment, safety checks Q15 min., suicide risk assessment, group therapy, psychoeducation, collateral contact, aftercare planning, ongoing physician assessments, medication education  Additional comments:  N/A  Estimated length of stay:  3-4 days  Discharge Plan:  Patient lives in a boarding house but doesn't really want to go back, has an ACT Team ([facility]), wants to go to [facility].  New goal(s): N/A  Review of initial/current patient goals per problem list:    1.  Goal(s):Stabilize mood  Met:  No  Target date:4/19 As evidenced ZO:XWRUE will rate depression, hopelessness, anxiety at 4 or less adn demonstrate decrease in labile affect  2.  Goal (s): Eliminate psychosis  Met:  No  Target date:4/19  As evidenced AV:[id number] will deny A/H - She denies today   3.  Goal(s):Identify comprehensivie sobriety plan  Met:  No  Target date:4/19  As evidenced XB:JYNW report - Wants to go to [facility] and possibly stay at [facility] until date for assessment.  This is likely to be 2 weeks, so will need to go home first.  States she has made up her mind to stay sober, and she is stubborn so can do so.  Was asked if she goes to meetings such as AA/NA.  She lives out  [employer] and would have to walk just to catch the bus to somewhere, or even just walk to get to a meeting -- does not feel she wants to do this.  4.  Goal(s):  Medication stabilization  Met:  No  Target date: by d/c  As evidenced by:  CM found out when Risperdal Consta injection is due (tomorrow) and instructed Dr. Rennis Petty dosage/date for administration here.  Attendees: Patient:  [name] 06/30/2011 11:11 AM   Family:     Physician:  Harvie Heck Readling 06/30/2011 8:25 AM   Nursing:   Nanine Means, RN 06/30/2011 8:25 AM   Case Manager:  Richelle Ito, LCSW 06/30/2011 8:25 AM   Counselor:  [name] 06/30/2011 8:25 AM   Other:  Izola Price, RN 06/30/2011 11:20 AM   Other:     Other:     Other:      Scribe for Treatment Team:   Daryel Gerald B, 06/30/2011 8:25 AM

## 2011-06-30 NOTE — Progress Notes (Signed)
Patient ID: [name], female   DOB: [birth year], 51 y.o.   MRN: [id number] Pt denied SI/HI/AVH, stated she came to [facility] to stop drinking and wants to go to [facility] for follow-up.  Her depression/anxiety/hopelessness are zeros, states her appetite & sleep are good.  Her plan is to be drug and alcohol free by Mother's Day, her birthday!

## 2011-06-30 NOTE — Progress Notes (Signed)
Pt denies any SI/HI/AVH. Pt mood labile. Pt vacillates between hostility and cooperation. Pt appearance disheveled. Pt obsessed with men. States that she would rather be laying in bed with a man and that she needs to find her a new man. This was the topic of conversation several times tonight. Support and encouragement offered. Pt receptive.

## 2011-07-01 MED ORDER — RISPERIDONE MICROSPHERES 50 MG IM SUSR: 50.0000 mg | INTRAMUSCULAR | Status: DC

## 2011-07-01 MED ORDER — BENZTROPINE MESYLATE 0.5 MG PO TABS: ORAL_TABLET | ORAL | Status: DC

## 2011-07-01 MED ORDER — LISINOPRIL 10 MG PO TABS: 10.0000 mg | ORAL_TABLET | Freq: Every day | ORAL | Status: DC

## 2011-07-01 MED ORDER — RISPERIDONE 3 MG PO TABS: ORAL_TABLET | ORAL | Status: DC

## 2011-07-01 MED ORDER — QUETIAPINE FUMARATE 50 MG PO TABS: 50.0000 mg | ORAL_TABLET | Freq: Every day | ORAL | Status: DC

## 2011-07-01 MED ORDER — SULFAMETHOXAZOLE-TMP DS 800-160 MG PO TABS: 1.0000 | ORAL_TABLET | Freq: Two times a day (BID) | ORAL | Status: AC

## 2011-07-01 MED ORDER — DIVALPROEX SODIUM ER 500 MG PO TB24: ORAL_TABLET | ORAL | Status: DC

## 2011-07-01 NOTE — Progress Notes (Signed)
[facility] Group Notes:  (Counselor/Nursing/MHT/Case Management/Adjunct)  07/01/2011 2:49 PM  Type of Therapy:  Group Therapy  Participation Level:  Did Not Attend     [name] 07/01/2011, 2:49 PM

## 2011-07-01 NOTE — Progress Notes (Deleted)
[facility] Group Notes:  (Counselor/Nursing/MHT/Case Management/Adjunct)  07/01/2011 9:24 AM  Type of Therapy:  Group Therapy  Participation Level:  Did Not Attend   [name] 07/01/2011, 9:24 AM

## 2011-07-01 NOTE — Progress Notes (Signed)
Patient ID: [name], female   DOB: [birth year], 51 y.o.   MRN: [id number] Pt discharged to ACT team this afternoon, pt denies any SI/HI, pt was given risperdal consta shot today, discharge instructions provided and pt did verbalize understanding, pt provided with prescriptions and supply of medications, all belongings returned

## 2011-07-01 NOTE — Discharge Summary (Signed)
Physician Discharge Summary Note  Patient:  [name] is an 51 y.o., female MRN:  [id number] DOB:  07-Oct-1960 Patient phone:  [phone] (home)  Patient address:   [address] [place] [address],   Date of Admission:  06/28/2011 Date of Discharge: 06/25/2011  Reason for Admission:  Discharge Diagnoses: Principal Problem:  *Alcohol dependence Active Problems:  Non-compliant patient  Hypertension  Cocaine abuse  Schizophrenia, undifferentiated   Axis Diagnosis:   AXIS I:  Schizophrenia, undifferentiated type; cocaine dependence; alcohol abuse; AXIS II:  Deferred AXIS III:  UTI Past Medical History  Diagnosis Date  . Arthritis   . Seizures   . Psychiatric illness   . Bipolar 1 disorder   . Schizo-affective schizophrenia   . Obesity    AXIS IV:  problems with access to health care services, social isolation AXIS V:  41-50 serious symptoms  Level of Care:  OP  Hospital Course:  This is one of several admissions for [name] who is well known to [place]. She presented in the emergency room complaining that she needed help with both her mental illness and her substance abuse. She been drinking alcohol and reported also using cocaine. Alcohol screen was negative and urine drug screen noted to be positive for cocaine. Her a valproic acid screen on admission was less than 10. She denied any suicidal thoughts. Also said she did not feel she could go home because didn't think she be safe unless she got treatment.  She was initially admitted for detox program, and was treated with Librium for withdrawal symptoms. By the second hospital day she had no withdrawal symptoms. She been previously taking Risperdal Consta received her last injection here in February and had not been getting any treatment since that time. She was given Risperdal Consta 50 mg on April 19, IM. She was cooperative with peers and staff on the unit.    Our case manager arranged for her to be restarted  with [facility] ACT team, and also made arrangements for her to enter [facility] recovery program on May 2. April 19 she reporting that she's sleeping well with no difficulty, and denying any significant depression anhedonia, or sadness. She was requesting discharge to followup with her ACT team.   Consults:  None  Significant Diagnostic Studies:  Urinalysis remarkable for 11-20 WBCs per high-powered field, GC and chlamydia negative, urine culture revealed 100,000 colonies of multiple bacterial morphotypes.  Discharge Vitals:   Blood pressure 118/88, pulse 112, temperature 98.2 F (36.8 C), temperature source Oral, resp. rate 20, height 5\' 6"  (1.676 m), weight 96.616 kg (213 lb), SpO2 98.00%.  Mental Status Exam: See Mental Status Examination and Suicide Risk Assessment completed by Attending Physician prior to discharge.  Discharge destination:  Home  Is patient on multiple antipsychotic therapies at discharge:  yes   Has Patient had three or more failed trials of antipsychotic monotherapy by history: yes and this includes Zyprexa, Risperdal, and Seroquel.   Recommended Plan for Multiple Antipsychotic Therapies: Recommend Risperdal Consta be continued and outpatient provider can consider tapering off the Seroquel as needed which is primarily for sleep and mood stability.     Medication List  As of 07/01/2011  2:49 PM   TAKE these medications      Indication    benztropine 0.5 MG tablet   Commonly known as: COGENTIN   Take one tablet, 0.5mg  daily at bedtime. For EPS.       divalproex 500 MG 24 hr tablet  Commonly known as: DEPAKOTE ER   Take one tablet (500mg  ER) daily in the morning, and two tablets (1000mg ) daily at bedtime.       lisinopril 10 MG tablet   Commonly known as: PRINIVIL,ZESTRIL   Take 1 tablet (10 mg total) by mouth daily. For blood pressure.       QUEtiapine 50 MG tablet   Commonly known as: SEROQUEL   Take 1 tablet (50 mg total) by mouth at bedtime. For  sleep and mood stability.       risperiDONE 3 MG tablet   Commonly known as: RISPERDAL   Take one tablet (3mg ) twice daily in the morning and at bedtime.  For psychosis.       risperiDONE microspheres 50 MG injection   Commonly known as: RISPERDAL CONSTA   Inject 2 mLs (50 mg total) into the muscle every 14 (fourteen) days. For psychosis, last given on 07/01/2011.       sulfamethoxazole-trimethoprim 800-160 MG per tablet   Commonly known as: BACTRIM DS   Take 1 tablet by mouth every 12 (twelve) hours. For UTI.            Follow-up Information    Follow up with [facility] on 07/14/2011. (8:00am sharp for assessment)    Contact information:   [address] [place], [place] [address] Phone: [phone]       Follow up with [facility] on 07/01/2011. (Pick up to transport home between 1-2pm)    Contact information:   [address] [place]  [address] Telephone:  [phone]         Follow-up recommendations:  Activity:  unrestricted Diet:  regular  Signed: Dashonna [name] A 07/01/2011, 2:49 PM
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
## 2011-07-01 NOTE — Tx Team (Signed)
Interdisciplinary Treatment Plan Update (Adult)  Date:  07/01/2011  Time Reviewed:  10:15AM-11:00AM  Progress in Treatment: Attending groups:  Yes Participating in groups:     Yes Taking medication as prescribed:     Yes Tolerating medication:    Yes Family/Significant other contact made:   Patient understands diagnosis:    Yes Yes Discussing patient identified problems/goals with staff:   No need Medical problems stabilized or resolved:    Yes Denies suicidal/homicidal ideation:   Yes Issues/concerns per patient self-inventory:   None Other:    New problem(s) identified: No, Describe:    Reason for Continuation of Hospitalization: None  Interventions implemented related to continuation of hospitalization:  Medication monitoring and adjustment, safety checks Q15 min., suicide risk assessment, group therapy, psychoeducation, collateral contact, aftercare planning, ongoing physician assessments, medication education - UNTIL DISCHARGE  Additional comments:  Not applicable  Estimated length of stay:  Discharge today  Discharge Plan:  Go back to boarding house, follow up with [facility], go to [facility] 5/2 for an assessment for rehab.  New goal(s):  Not applicable  Review of initial/current patient goals per problem list:   1.  Goal(s):  Stabilize mood  Met:  Yes  Target date:  By Discharge   As evidenced by:  Mood is stable, "0" depression today  2.  Goal(s):  Eliminate psychosis  Met:  Yes  Target date:  By Discharge   As evidenced by:  Denies psychosis today  3.  Goal(s):  Identify comprehensivie sobriety plan  Met:  Yes  Target date:  By Discharge   As evidenced by:  Assessment 5/2 at [facility]  4.  Goal(s):  Medication stabilization  Met:  Yes  Target date:  By Discharge   As evidenced by:  Got Risperdal Consta injection, is stable  Attendees: Patient:  [name]  07/01/2011 10:15AM-11:00AM  Family:     Physician:  Dr. Harvie Heck  Readling 07/01/2011 10:15AM-11:00AM  Nursing:   Tacy Learn, RN 07/01/2011 10:15AM -11:00AM   Case Manager:  Ambrose Mantle, LCSW 07/01/2011 10:15AM-11:00AM  Counselor:  [name], MT-BC 07/01/2011 10:15AM-11:00AM  Other:   Lynann Bologna, NP 07/01/2011 10:15AM-11:00AM  Other:   Everlene Balls, RN 07/01/2011 11:36 AM   Other:      Other:       Scribe for Treatment Team:   Sarina Ser, 07/01/2011, 10:15AM-11:15AM

## 2011-07-01 NOTE — Progress Notes (Signed)
Pt is in bed resting with eyes closed  No apparent distress  Normal breathing sounds  Will continue Q 15 min checks  Pt safe at present

## 2011-07-01 NOTE — Progress Notes (Signed)
[facility] Group Notes:  (Counselor/Nursing/MHT/Case Management/Adjunct)  07/01/2011 9:25 AM  Type of Therapy:  Group Therapy 06/30/11  Participation Level:  Did Not Attend   [name] 07/01/2011, 9:25 AM

## 2011-07-01 NOTE — Progress Notes (Signed)
[facility] Case Management Discharge Plan:  Will you be returning to the same living situation after discharge: Yes,  boarding house At discharge, do you have transportation home?:Yes,  [facility] Do you have the ability to pay for your medications:Yes,  income and insurance  Interagency Information:     Release of information consent forms completed and in the chart;  Patient's signature needed at discharge.  Patient to Follow up at:  Follow-up Information    Follow up with [facility] on 07/14/2011. (8:00am sharp for assessment)    Contact information:   [address] [place], [place] [address] Phone: [phone]       Follow up with [facility] on 07/01/2011. (Pick up to transport home between 1-2pm)    Contact information:   [address] [place]  [address] Telephone:  [phone]         Patient denies SI/HI:   Yes,      Safety Planning and Suicide Prevention discussed:  Yes,  During Aftercare Planning Groups throughout stay, Case Managers provided psychoeducation on "Suicide Prevention Information."  This included descriptions of risk factors for suicide, warning signs that an individual is in crisis and thinking of suicide, and what to do if this occurs.  Pt indicated understanding of information provided, and will read brochure given upon discharge.     Barrier to discharge identified:No.  Summary and Recommendations:  Patient came for detox and was on 300 hall, then switched to 400 hall.  Will follow up with her ACT Team.  Will go to [facility] for an assessment and probably admission on 07/14/11.   Sarina Ser 07/01/2011, 12:34 PM

## 2011-07-06 NOTE — Progress Notes (Signed)
Patient Discharge Instructions:  Psychiatric Admission Assessment Note Provided,  07/05/2011 After Visit Summary (AVS) Provided,  07/05/2011 Face Sheet Provided, 07/05/2011 Faxed/Sent to the Next Level Care provider:  07/05/2011 Provided Suicide Risk Assessment - Discharge Assessment 07/05/2011  Faxed to [facility] @ [phone] And Faxed to [facility] @ [phone]  Wandra Scot, 07/06/2011, 4:36 PM

## 2011-10-18 ENCOUNTER — Emergency Department (HOSPITAL_COMMUNITY): Payer: Medicare Other

## 2011-10-18 ENCOUNTER — Emergency Department (HOSPITAL_COMMUNITY)
Admission: EM | Admit: 2011-10-18 | Discharge: 2011-10-20 | Disposition: A | Discharge status: Home / Self Care | Payer: Medicare Other | Attending: Emergency Medicine | Admitting: Emergency Medicine

## 2011-10-18 ENCOUNTER — Encounter (HOSPITAL_COMMUNITY): Payer: Self-pay | Admitting: *Deleted

## 2011-10-18 DIAGNOSIS — F209 Schizophrenia, unspecified: Secondary | ICD-10-CM | POA: Insufficient documentation

## 2011-10-18 DIAGNOSIS — F319 Bipolar disorder, unspecified: Secondary | ICD-10-CM | POA: Insufficient documentation

## 2011-10-18 DIAGNOSIS — F1010 Alcohol abuse, uncomplicated: Secondary | ICD-10-CM | POA: Insufficient documentation

## 2011-10-18 DIAGNOSIS — F17200 Nicotine dependence, unspecified, uncomplicated: Secondary | ICD-10-CM | POA: Insufficient documentation

## 2011-10-18 DIAGNOSIS — E669 Obesity, unspecified: Secondary | ICD-10-CM | POA: Insufficient documentation

## 2011-10-18 DIAGNOSIS — Z8739 Personal history of other diseases of the musculoskeletal system and connective tissue: Secondary | ICD-10-CM | POA: Insufficient documentation

## 2011-10-18 DIAGNOSIS — F1410 Cocaine abuse, uncomplicated: Secondary | ICD-10-CM | POA: Insufficient documentation

## 2011-10-18 LAB — RAPID URINE DRUG SCREEN, HOSP PERFORMED
Amphetamines: NOT DETECTED
Barbiturates: NOT DETECTED
Cocaine: POSITIVE — AB
Opiates: NOT DETECTED
Tetrahydrocannabinol: NOT DETECTED

## 2011-10-18 LAB — COMPREHENSIVE METABOLIC PANEL
AST: 14 U/L (ref 0–37)
Albumin: 3.7 g/dL (ref 3.5–5.2)
Alkaline Phosphatase: 65 U/L (ref 39–117)
BUN: 14 mg/dL (ref 6–23)
Chloride: 101 mEq/L (ref 96–112)
Potassium: 4 mEq/L (ref 3.5–5.1)
Sodium: 139 mEq/L (ref 135–145)
Total Bilirubin: 0.3 mg/dL (ref 0.3–1.2)
Total Protein: 7.6 g/dL (ref 6.0–8.3)

## 2011-10-18 LAB — TROPONIN I: Troponin I: <0.3 ng/mL (ref <0.30)

## 2011-10-18 LAB — CBC
HCT: 41.9 % (ref 36.0–46.0)
MCH: 29.8 pg (ref 26.0–34.0)
MCV: 88.6 fL (ref 78.0–100.0)
Platelets: 265 10*3/uL (ref 150–400)
RDW: 14.7 % (ref 11.5–15.5)
WBC: 4.4 10*3/uL (ref 4.0–10.5)

## 2011-10-18 MED ORDER — BENZTROPINE MESYLATE 1 MG PO TABS
0.5000 mg | ORAL_TABLET | Freq: Every day | ORAL | Status: DC
  Administered 2011-10-18 – 2011-10-19 (×2): 0.5 mg via ORAL
  Filled 2011-10-18 (×2): qty 1

## 2011-10-18 MED ORDER — ONDANSETRON HCL 4 MG PO TABS: 4.0000 mg | ORAL_TABLET | Freq: Three times a day (TID) | ORAL | Status: DC | PRN

## 2011-10-18 MED ORDER — ALUM & MAG HYDROXIDE-SIMETH 200-200-20 MG/5ML PO SUSP: 30.0000 mL | ORAL | Status: DC | PRN

## 2011-10-18 MED ORDER — QUETIAPINE FUMARATE 50 MG PO TABS
50.0000 mg | ORAL_TABLET | Freq: Every day | ORAL | Status: DC
  Administered 2011-10-18 – 2011-10-19 (×2): 50 mg via ORAL
  Filled 2011-10-18 (×2): qty 1

## 2011-10-18 MED ORDER — RISPERIDONE 2 MG PO TABS
3.0000 mg | ORAL_TABLET | Freq: Two times a day (BID) | ORAL | Status: DC
  Administered 2011-10-18 – 2011-10-19 (×3): 3 mg via ORAL
  Filled 2011-10-18 (×3): qty 1

## 2011-10-18 MED ORDER — IBUPROFEN 800 MG PO TABS
800.0000 mg | ORAL_TABLET | Freq: Once | ORAL | Status: AC
  Administered 2011-10-18: 800 mg via ORAL
  Filled 2011-10-18: qty 1

## 2011-10-18 MED ORDER — DIVALPROEX SODIUM ER 500 MG PO TB24
500.0000 mg | ORAL_TABLET | Freq: Every day | ORAL | Status: DC
  Administered 2011-10-18 – 2011-10-19 (×2): 500 mg via ORAL
  Filled 2011-10-18 (×3): qty 1

## 2011-10-18 MED ORDER — DIVALPROEX SODIUM ER 500 MG PO TB24
1000.0000 mg | ORAL_TABLET | Freq: Every day | ORAL | Status: DC
  Administered 2011-10-18 – 2011-10-19 (×2): 1000 mg via ORAL
  Filled 2011-10-18 (×3): qty 2

## 2011-10-18 MED ORDER — LORAZEPAM 1 MG PO TABS: 1.0000 mg | ORAL_TABLET | Freq: Three times a day (TID) | ORAL | Status: DC | PRN

## 2011-10-18 MED ORDER — IBUPROFEN 600 MG PO TABS: 600.0000 mg | ORAL_TABLET | Freq: Three times a day (TID) | ORAL | Status: DC | PRN

## 2011-10-18 MED ORDER — ACETAMINOPHEN 325 MG PO TABS: 650.0000 mg | ORAL_TABLET | ORAL | Status: DC | PRN

## 2011-10-18 MED ORDER — ZOLPIDEM TARTRATE 5 MG PO TABS
5.0000 mg | ORAL_TABLET | Freq: Every evening | ORAL | Status: DC | PRN
  Administered 2011-10-19: 5 mg via ORAL
  Filled 2011-10-18: qty 1

## 2011-10-18 MED ORDER — NICOTINE 14 MG/24HR TD PT24
14.0000 mg | MEDICATED_PATCH | Freq: Every day | TRANSDERMAL | Status: DC
  Administered 2011-10-18: 14 mg via TRANSDERMAL
  Filled 2011-10-18: qty 1

## 2011-10-18 MED ORDER — ASPIRIN 81 MG PO CHEW
324.0000 mg | CHEWABLE_TABLET | Freq: Once | ORAL | Status: AC
  Administered 2011-10-18: 324 mg via ORAL
  Filled 2011-10-18: qty 4
  Filled 2011-10-18: qty 1

## 2011-10-18 MED ORDER — LISINOPRIL 10 MG PO TABS
10.0000 mg | ORAL_TABLET | Freq: Every day | ORAL | Status: DC
  Administered 2011-10-18 – 2011-10-19 (×2): 10 mg via ORAL
  Filled 2011-10-18 (×3): qty 1

## 2011-10-18 NOTE — ED Provider Notes (Signed)
History     CSN: [id number]  Arrival date & time 10/18/11  1404   First MD Initiated Contact with Patient 10/18/11 1520      Chief Complaint  Patient presents with  . Medical Clearance    wants detox off crack    (Consider location/radiation/quality/duration/timing/severity/associated sxs/prior treatment) The history is provided by the patient.   51 year old female comes in asking for detox and to be admitted to the [facility] health hospital. She has a history of schizophrenia and bipolar disorder and has not been taking her medications. She is upset that the people who live in the house she doesn't have moved out leaving her alone. She's been drinking heavily and using crack cocaine daily. She denies other drug use. She's been having intermittent, sharp anterior chest pain for the last 2 weeks. Pain does not radiate. It is sometimes worse with deep breath and there is associated mild dyspnea. She denies nausea, vomiting, diaphoresis. She has not been coughing. She denies fever or chills. She denies homicidal or suicidal ideation. She admits to visual hallucinations but no auditory hallucinations.  Past Medical History  Diagnosis Date  . Arthritis   . Seizures   . Psychiatric illness   . Bipolar 1 disorder   . Schizo-affective schizophrenia   . Obesity     Past Surgical History  Procedure Date  . Stomach surgery     Family History  Problem Relation Age of Onset  . Alzheimer's disease Mother   . Depression Mother     History  Substance Use Topics  . Smoking status: Current Everyday Smoker -- 1.0 packs/day    Types: Cigarettes  . Smokeless tobacco: Never Used  . Alcohol Use: 3.6 oz/week    6 Cans of beer per week    OB History    Grav Para Term Preterm Abortions TAB SAB Ect Mult Living                  Review of Systems  All other systems reviewed and are negative.    Allergies  Review of patient's allergies indicates no known allergies.  Home  Medications   Current Outpatient Rx  Name Route Sig Dispense Refill  . BENZTROPINE MESYLATE 0.5 MG PO TABS  Take one tablet, 0.5mg  daily at bedtime. For EPS. 30 tablet 0  . DIVALPROEX SODIUM ER 500 MG PO TB24  Take one tablet (500mg  ER) daily in the morning, and two tablets (1000mg ) daily at bedtime. 90 tablet 0  . LISINOPRIL 10 MG PO TABS Oral Take 1 tablet (10 mg total) by mouth daily. For blood pressure. 30 tablet 0  . QUETIAPINE FUMARATE 50 MG PO TABS Oral Take 1 tablet (50 mg total) by mouth at bedtime. For sleep and mood stability. 30 tablet 0  . RISPERIDONE 3 MG PO TABS  Take one tablet (3mg ) twice daily in the morning and at bedtime.  For psychosis. 60 tablet 0  . RISPERIDONE MICROSPHERES 50 MG IM SUSR Intramuscular Inject 2 mLs (50 mg total) into the muscle every 14 (fourteen) days. For psychosis, last given on 07/01/2011. 1 each 0    BP 127/94  Pulse 102  Temp 98.5 F (36.9 C)  Resp 22  Physical Exam  Nursing note and vitals reviewed. 51year old female, resting comfortably and in no acute distress. Vital signs are significant for a water line tachycardia with heart rate 102, mild tachypnea with respiratory rate of 22, mild diastolic hypertension with blood pressure 127/94.. Oxygen  saturation is 96%, which is normal. Head is normocephalic and atraumatic. PERRLA, EOMI. Oropharynx is clear. Neck is nontender and supple without adenopathy or JVD. Back is nontender and there is no CVA tenderness. Lungs are clear without rales, wheezes, or rhonchi. Chest is nontender. Heart has regular rate and rhythm without murmur. Abdomen is soft, flat, nontender without masses or hepatosplenomegaly and peristalsis is normoactive. Extremities have no cyanosis or edema, full range of motion is present. Skin is warm and dry without rash. Neurologic: She is awake, alert, oriented, cranial nerves are intact, there are no motor or sensory deficits. Psychiatric: Affect is normal although she does  endorse visual hallucinations.  ED Course  Procedures (including critical care time)  Results for orders placed during the hospital encounter of 10/18/11  COMPREHENSIVE METABOLIC PANEL      Component Value Range   Sodium 139  135 - 145 mEq/L   Potassium 4.0  3.5 - 5.1 mEq/L   Chloride 101  96 - 112 mEq/L   CO2 31  19 - 32 mEq/L   Glucose, Bld 99  70 - 99 mg/dL   BUN 14  6 - 23 mg/dL   Creatinine, Ser 7.82  0.50 - 1.10 mg/dL   Calcium 9.6  8.4 - 95.6 mg/dL   Total Protein 7.6  6.0 - 8.3 g/dL   Albumin 3.7  3.5 - 5.2 g/dL   AST 14  0 - 37 U/L   ALT 16  0 - 35 U/L   Alkaline Phosphatase 65  39 - 117 U/L   Total Bilirubin 0.3  0.3 - 1.2 mg/dL   GFR calc non Af Amer 72 (*) >90 mL/min   GFR calc Af Amer 84 (*) >90 mL/min  CBC      Component Value Range   WBC 4.4  4.0 - 10.5 K/uL   RBC 4.73  3.87 - 5.11 MIL/uL   Hemoglobin 14.1  12.0 - 15.0 g/dL   HCT 21.3  08.6 - 57.8 %   MCV 88.6  78.0 - 100.0 fL   MCH 29.8  26.0 - 34.0 pg   MCHC 33.7  30.0 - 36.0 g/dL   RDW 46.9  62.9 - 52.8 %   Platelets 265  150 - 400 K/uL  ETHANOL      Component Value Range   Alcohol, Ethyl (B) <11  0 - 11 mg/dL  ACETAMINOPHEN LEVEL      Component Value Range   Acetaminophen (Tylenol), Serum <15.0  10 - 30 ug/mL  URINE RAPID DRUG SCREEN (HOSP PERFORMED)      Component Value Range   Opiates NONE DETECTED  NONE DETECTED   Cocaine POSITIVE (*) NONE DETECTED   Benzodiazepines NONE DETECTED  NONE DETECTED   Amphetamines NONE DETECTED  NONE DETECTED   Tetrahydrocannabinol NONE DETECTED  NONE DETECTED   Barbiturates NONE DETECTED  NONE DETECTED  POCT PREGNANCY, URINE      Component Value Range   Preg Test, Ur NEGATIVE  NEGATIVE  TROPONIN I      Component Value Range   Troponin I <0.30  <0.30 ng/mL  D-DIMER, QUANTITATIVE      Component Value Range   D-Dimer, Quant <0.22  0.00 - 0.48 ug/mL-FEU   Dg Chest 2 View  10/18/2011  *RADIOLOGY REPORT*  Clinical Data: Medical clearance  CHEST - 2 VIEW   Comparison: Chest radiograph 06/21/2010  Findings: Normal cardiac silhouette.  No effusion, infiltrate, or pneumothorax. No acute osseous abnormality.  IMPRESSION: No acute cardiopulmonary  process.  Original Report Authenticated By: Genevive Bi, M.D.      Date: 10/18/2011  Rate: 85  Rhythm: normal sinus rhythm  QRS Axis: normal  Intervals: normal  ST/T Wave abnormalities: nonspecific T wave changes  Conduction Disutrbances:none  Narrative Interpretation: Right atrial margin, nonspecific T wave changes. When compared with ECG of 06/27/2011, no significant changes are seen.  Old EKG Reviewed: unchanged   1. Schizophrenia   2. Alcohol abuse   3. Cocaine abuse       MDM  Exacerbation of schizophrenia secondary to medication noncompliance. Alcohol and cocaine abuse. Chest pain while which may be cocaine related. Cardiac evaluation will be done, d-dimer will be obtained to rule out pulmonary embolism. Screening labs have been obtained for medical clearance. She's given aspirin and ibuprofen. Chest 2 views and she had been admitted to the [facility] health hospital last April for her schizophrenia.        Dione Booze, MD 10/19/11 [phone]

## 2011-10-18 NOTE — ED Notes (Signed)
Pt provided with sandwich & ginger ale

## 2011-10-18 NOTE — ED Notes (Signed)
Pt states "the police told me I need to come here for detox off crack and drinking and stuff, smoking a lot of cigarettes"
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
## 2011-10-19 NOTE — ED Notes (Signed)
Pt has use a phone call
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
## 2011-10-19 NOTE — ED Notes (Signed)
Pt has been explain she is waiting to see the act team

## 2011-10-20 NOTE — ED Provider Notes (Signed)
7:26 AM Filed Vitals:   10/19/11 2235  BP: 137/91  Pulse: 70  Temp: 98.2 F (36.8 C)  Resp: 18   Pt is not a threat to herself or others. Has her psych meds at home. Outpatient referrels given  Lyanne Co, MD 10/20/11 [phone]

## 2012-01-31 ENCOUNTER — Emergency Department (HOSPITAL_COMMUNITY)
Admission: EM | Admit: 2012-01-31 | Discharge: 2012-01-31 | Disposition: A | Discharge status: Home / Self Care | Payer: Medicare Other | Attending: Emergency Medicine | Admitting: Emergency Medicine

## 2012-01-31 ENCOUNTER — Encounter (HOSPITAL_COMMUNITY): Payer: Self-pay | Admitting: Emergency Medicine

## 2012-01-31 DIAGNOSIS — Z8739 Personal history of other diseases of the musculoskeletal system and connective tissue: Secondary | ICD-10-CM | POA: Insufficient documentation

## 2012-01-31 DIAGNOSIS — R569 Unspecified convulsions: Secondary | ICD-10-CM | POA: Insufficient documentation

## 2012-01-31 DIAGNOSIS — F259 Schizoaffective disorder, unspecified: Secondary | ICD-10-CM | POA: Insufficient documentation

## 2012-01-31 DIAGNOSIS — Z79899 Other long term (current) drug therapy: Secondary | ICD-10-CM | POA: Insufficient documentation

## 2012-01-31 DIAGNOSIS — F319 Bipolar disorder, unspecified: Secondary | ICD-10-CM | POA: Insufficient documentation

## 2012-01-31 DIAGNOSIS — F1910 Other psychoactive substance abuse, uncomplicated: Secondary | ICD-10-CM | POA: Insufficient documentation

## 2012-01-31 DIAGNOSIS — F17200 Nicotine dependence, unspecified, uncomplicated: Secondary | ICD-10-CM | POA: Insufficient documentation

## 2012-01-31 DIAGNOSIS — E669 Obesity, unspecified: Secondary | ICD-10-CM | POA: Insufficient documentation

## 2012-01-31 LAB — CBC
MCHC: 34.2 g/dL (ref 30.0–36.0)
Platelets: 257 10*3/uL (ref 150–400)
RDW: 14.1 % (ref 11.5–15.5)

## 2012-01-31 LAB — COMPREHENSIVE METABOLIC PANEL
ALT: 24 U/L (ref 0–35)
Albumin: 3.6 g/dL (ref 3.5–5.2)
Alkaline Phosphatase: 66 U/L (ref 39–117)
Potassium: 3.8 mEq/L (ref 3.5–5.1)
Sodium: 137 mEq/L (ref 135–145)
Total Protein: 7.3 g/dL (ref 6.0–8.3)

## 2012-01-31 LAB — RAPID URINE DRUG SCREEN, HOSP PERFORMED
Barbiturates: NOT DETECTED
Benzodiazepines: NOT DETECTED
Cocaine: POSITIVE — AB
Tetrahydrocannabinol: NOT DETECTED

## 2012-01-31 LAB — ACETAMINOPHEN LEVEL: Acetaminophen (Tylenol), Serum: <15 ug/mL (ref 10–30)

## 2012-01-31 MED ORDER — ADULT MULTIVITAMIN W/MINERALS CH
1.0000 | ORAL_TABLET | Freq: Every day | ORAL | Status: DC
  Administered 2012-01-31: 1 via ORAL
  Filled 2012-01-31: qty 1

## 2012-01-31 MED ORDER — BENZTROPINE MESYLATE 1 MG PO TABS
0.5000 mg | ORAL_TABLET | Freq: Every day | ORAL | Status: DC
  Administered 2012-01-31: 0.5 mg via ORAL
  Filled 2012-01-31: qty 1

## 2012-01-31 MED ORDER — QUETIAPINE FUMARATE 50 MG PO TABS: 50.0000 mg | ORAL_TABLET | Freq: Every day | ORAL | Status: DC

## 2012-01-31 MED ORDER — ACETAMINOPHEN 325 MG PO TABS: 650.0000 mg | ORAL_TABLET | ORAL | Status: DC | PRN

## 2012-01-31 MED ORDER — LORAZEPAM 1 MG PO TABS: 1.0000 mg | ORAL_TABLET | Freq: Four times a day (QID) | ORAL | Status: DC | PRN

## 2012-01-31 MED ORDER — NICOTINE 21 MG/24HR TD PT24
21.0000 mg | MEDICATED_PATCH | Freq: Once | TRANSDERMAL | Status: DC
  Administered 2012-01-31: 21 mg via TRANSDERMAL
  Filled 2012-01-31: qty 1

## 2012-01-31 MED ORDER — IBUPROFEN 600 MG PO TABS
600.0000 mg | ORAL_TABLET | Freq: Three times a day (TID) | ORAL | Status: DC | PRN
  Administered 2012-01-31: 600 mg via ORAL
  Filled 2012-01-31: qty 1

## 2012-01-31 MED ORDER — LISINOPRIL 10 MG PO TABS
10.0000 mg | ORAL_TABLET | Freq: Every day | ORAL | Status: DC
  Administered 2012-01-31: 10 mg via ORAL
  Filled 2012-01-31: qty 1

## 2012-01-31 MED ORDER — LORAZEPAM 1 MG PO TABS: 1.0000 mg | ORAL_TABLET | Freq: Three times a day (TID) | ORAL | Status: DC | PRN

## 2012-01-31 MED ORDER — LORAZEPAM 2 MG/ML IJ SOLN: 1.0000 mg | Freq: Four times a day (QID) | INTRAMUSCULAR | Status: DC | PRN

## 2012-01-31 MED ORDER — FOLIC ACID 1 MG PO TABS
1.0000 mg | ORAL_TABLET | Freq: Every day | ORAL | Status: DC
  Administered 2012-01-31: 1 mg via ORAL
  Filled 2012-01-31: qty 1

## 2012-01-31 MED ORDER — RISPERIDONE 2 MG PO TABS
3.0000 mg | ORAL_TABLET | Freq: Two times a day (BID) | ORAL | Status: DC
  Administered 2012-01-31: 3 mg via ORAL
  Filled 2012-01-31: qty 1

## 2012-01-31 MED ORDER — DIVALPROEX SODIUM 500 MG PO DR TAB
500.0000 mg | DELAYED_RELEASE_TABLET | Freq: Two times a day (BID) | ORAL | Status: DC
  Administered 2012-01-31: 500 mg via ORAL
  Filled 2012-01-31: qty 1

## 2012-01-31 MED ORDER — THIAMINE HCL 100 MG/ML IJ SOLN: 100.0000 mg | Freq: Every day | INTRAMUSCULAR | Status: DC

## 2012-01-31 MED ORDER — ONDANSETRON HCL 4 MG PO TABS: 4.0000 mg | ORAL_TABLET | Freq: Three times a day (TID) | ORAL | Status: DC | PRN

## 2012-01-31 MED ORDER — VITAMIN B-1 100 MG PO TABS
100.0000 mg | ORAL_TABLET | Freq: Every day | ORAL | Status: DC
  Administered 2012-01-31: 100 mg via ORAL
  Filled 2012-01-31: qty 1

## 2012-01-31 NOTE — ED Notes (Signed)
Pt has 1 belongings bag with a black pair of pants 1 shirt 1 black and white jacket and a pair of shoes

## 2012-01-31 NOTE — ED Notes (Signed)
Pt belongings are in triage in locker number 3/ pt has been wanded

## 2012-01-31 NOTE — ED Notes (Signed)
Pt wants detox from crack and ETOH was in detox 3-5 days recently and that she wants to be there longer. Walked in with ems denies any SI or HI. Last use was last night. Also wants [place] to look at a knot on her neck.

## 2012-01-31 NOTE — ED Provider Notes (Signed)
History     CSN: [id number]  Arrival date & time 01/31/12  1112   First MD Initiated Contact with Patient 01/31/12 1228      Chief Complaint  Patient presents with  . Medical Clearance  . Cyst    (Consider location/radiation/quality/duration/timing/severity/associated sxs/prior treatment) HPI Comments: Patient presents to the ED requesting detox from alcohol and crack cocaine. She states that she was in detox about a week ago but feels that seems to stay longer. She states that she drinks about 40 ounces of beer daily and her last drink was last night. She also uses crack cocaine daily and her last use was last night. She does have a history of schizophrenia but denies any current hallucinations. Denies any suicidal or homicidal ideations. She feels like there is a little sore not on the left side of her neck but denies any other physical complaints.   Past Medical History  Diagnosis Date  . Arthritis   . Seizures   . Psychiatric illness   . Bipolar 1 disorder   . Schizo-affective schizophrenia   . Obesity     Past Surgical History  Procedure Date  . Stomach surgery     Family History  Problem Relation Age of Onset  . Alzheimer's disease Mother   . Depression Mother     History  Substance Use Topics  . Smoking status: Current Every Day Smoker -- 1.0 packs/day    Types: Cigarettes  . Smokeless tobacco: Never Used  . Alcohol Use: 3.6 oz/week    6 Cans of beer per week    OB History    Grav Para Term Preterm Abortions TAB SAB Ect Mult Living                  Review of Systems  Constitutional: Negative for fever, chills, diaphoresis and fatigue.  HENT: Negative for congestion, rhinorrhea and sneezing.   Eyes: Negative.   Respiratory: Negative for cough, chest tightness and shortness of breath.   Cardiovascular: Negative for chest pain and leg swelling.  Gastrointestinal: Negative for nausea, vomiting, abdominal pain, diarrhea and blood in stool.    Genitourinary: Negative for frequency, hematuria, flank pain and difficulty urinating.  Musculoskeletal: Negative for back pain and arthralgias.  Skin: Negative for rash.  Neurological: Negative for dizziness, speech difficulty, weakness, numbness and headaches.    Allergies  Review of patient's allergies indicates no known allergies.  Home Medications   Current Outpatient Rx  Name  Route  Sig  Dispense  Refill  . BENZTROPINE MESYLATE 0.5 MG PO TABS      Take one tablet, 0.5mg  daily at bedtime. For EPS.   30 tablet   0   . QUETIAPINE FUMARATE 50 MG PO TABS   Oral   Take 1 tablet (50 mg total) by mouth at bedtime. For sleep and mood stability.   30 tablet   0   . RISPERIDONE MICROSPHERES 50 MG IM SUSR   Intramuscular   Inject 2 mLs (50 mg total) into the muscle every 14 (fourteen) days. For psychosis, last given on 07/01/2011.   1 each   0   . DIVALPROEX SODIUM ER 500 MG PO TB24      Take one tablet (500mg  ER) daily in the morning, and two tablets (1000mg ) daily at bedtime.   90 tablet   0   . LISINOPRIL 10 MG PO TABS   Oral   Take 1 tablet (10 mg total) by mouth daily. For blood pressure.  30 tablet   0   . RISPERIDONE 3 MG PO TABS      Take one tablet (3mg ) twice daily in the morning and at bedtime.  For psychosis.   60 tablet   0     BP 162/110  Pulse 94  Temp 98.5 F (36.9 C) (Oral)  Resp 18  SpO2 99%  Physical Exam  Constitutional: She is oriented to person, place, and time. She appears well-developed and well-nourished.  HENT:  Head: Normocephalic and atraumatic.  Mouth/Throat: Oropharynx is clear and moist.  Eyes: Conjunctivae normal are normal. Pupils are equal, round, and reactive to light.  Neck: Normal range of motion. Neck supple.       Small tender mobile submandibular lymph node to the left neck. There is no signs of infection. She has no teeth but there is no oral signs of infection. Her oropharynx is clear.  Cardiovascular: Normal  rate, regular rhythm and normal heart sounds.   Pulmonary/Chest: Effort normal and breath sounds normal. No respiratory distress. She has no wheezes. She has no rales. She exhibits no tenderness.  Abdominal: Soft. Bowel sounds are normal. There is no tenderness. There is no rebound and no guarding.  Musculoskeletal: Normal range of motion. She exhibits no edema.  Lymphadenopathy:    She has cervical adenopathy.  Neurological: She is alert and oriented to person, place, and time.  Skin: Skin is warm and dry. No rash noted.  Psychiatric: She has a normal mood and affect.    ED Course  Procedures (including critical care time)  Results for orders placed during the hospital encounter of 01/31/12  ACETAMINOPHEN LEVEL      Component Value Range   Acetaminophen (Tylenol), Serum <15.0  10 - 30 ug/mL  CBC      Component Value Range   WBC 4.4  4.0 - 10.5 K/uL   RBC 4.80  3.87 - 5.11 MIL/uL   Hemoglobin 14.4  12.0 - 15.0 g/dL   HCT 69.6  29.5 - 28.4 %   MCV 87.7  78.0 - 100.0 fL   MCH 30.0  26.0 - 34.0 pg   MCHC 34.2  30.0 - 36.0 g/dL   RDW 13.2  44.0 - 10.2 %   Platelets 257  150 - 400 K/uL  COMPREHENSIVE METABOLIC PANEL      Component Value Range   Sodium 137  135 - 145 mEq/L   Potassium 3.8  3.5 - 5.1 mEq/L   Chloride 102  96 - 112 mEq/L   CO2 25  19 - 32 mEq/L   Glucose, Bld 115 (*) 70 - 99 mg/dL   BUN 10  6 - 23 mg/dL   Creatinine, Ser 7.25  0.50 - 1.10 mg/dL   Calcium 9.5  8.4 - 36.6 mg/dL   Total Protein 7.3  6.0 - 8.3 g/dL   Albumin 3.6  3.5 - 5.2 g/dL   AST 18  0 - 37 U/L   ALT 24  0 - 35 U/L   Alkaline Phosphatase 66  39 - 117 U/L   Total Bilirubin 0.4  0.3 - 1.2 mg/dL   GFR calc non Af Amer 74 (*) >90 mL/min   GFR calc Af Amer 86 (*) >90 mL/min  ETHANOL      Component Value Range   Alcohol, Ethyl (B) <11  0 - 11 mg/dL  SALICYLATE LEVEL      Component Value Range   Salicylate Lvl <2.0 (*) 2.8 - 20.0 mg/dL  URINE  RAPID DRUG SCREEN (HOSP PERFORMED)      Component  Value Range   Opiates NONE DETECTED  NONE DETECTED   Cocaine POSITIVE (*) NONE DETECTED   Benzodiazepines NONE DETECTED  NONE DETECTED   Amphetamines NONE DETECTED  NONE DETECTED   Tetrahydrocannabinol NONE DETECTED  NONE DETECTED   Barbiturates NONE DETECTED  NONE DETECTED   No results found.    1. Polysubstance abuse       MDM  Patiently placed on CIWA protocol I will start her back on her psych medications and consult ACT team for placement.  Discussed with ACT        Rolan Bucco, MD 01/31/12 1450

## 2012-01-31 NOTE — ED Notes (Signed)
Pt alert and oriented x4. Respirations even and unlabored, bilateral symmetrical rise and fall of chest. Skin warm and dry. In no acute distress. Denies needs.   

## 2012-01-31 NOTE — ED Provider Notes (Signed)
Pt has been assessed by ACT team.  She does not wants to stay any longer.  She is interested in a long term residential type facility.  Outpt resources will be offered.  Celene Kras, MD 01/31/12 [phone]

## 2012-01-31 NOTE — B[facility] Counselor (Signed)
Pt declined assessment; asking to be discharged home. Referrals given and SW provided bus pass. The EDP-Dr. Iantha Fallen was made aware and agrees to discharge patient home with follow up referrals. Nothing else needed at this time.

## 2012-01-31 NOTE — ED Notes (Signed)
Pt called [facility], part of case management and is coming to pick pt up. Pt will wait in lobby. Ride should be here in 15-20 min

## 2012-02-12 ENCOUNTER — Encounter (HOSPITAL_COMMUNITY): Payer: Self-pay | Admitting: Emergency Medicine

## 2012-02-12 ENCOUNTER — Emergency Department (HOSPITAL_COMMUNITY)
Admission: EM | Admit: 2012-02-12 | Discharge: 2012-02-13 | Disposition: A | Discharge status: Home / Self Care | Payer: Medicare Other | Attending: Emergency Medicine | Admitting: Emergency Medicine

## 2012-02-12 DIAGNOSIS — E669 Obesity, unspecified: Secondary | ICD-10-CM | POA: Insufficient documentation

## 2012-02-12 DIAGNOSIS — F411 Generalized anxiety disorder: Secondary | ICD-10-CM | POA: Insufficient documentation

## 2012-02-12 DIAGNOSIS — F319 Bipolar disorder, unspecified: Secondary | ICD-10-CM | POA: Insufficient documentation

## 2012-02-12 DIAGNOSIS — G40909 Epilepsy, unspecified, not intractable, without status epilepticus: Secondary | ICD-10-CM | POA: Insufficient documentation

## 2012-02-12 DIAGNOSIS — Z79899 Other long term (current) drug therapy: Secondary | ICD-10-CM | POA: Insufficient documentation

## 2012-02-12 DIAGNOSIS — M129 Arthropathy, unspecified: Secondary | ICD-10-CM | POA: Insufficient documentation

## 2012-02-12 DIAGNOSIS — F1410 Cocaine abuse, uncomplicated: Secondary | ICD-10-CM | POA: Insufficient documentation

## 2012-02-12 DIAGNOSIS — F259 Schizoaffective disorder, unspecified: Secondary | ICD-10-CM | POA: Insufficient documentation

## 2012-02-12 DIAGNOSIS — F17200 Nicotine dependence, unspecified, uncomplicated: Secondary | ICD-10-CM | POA: Insufficient documentation

## 2012-02-12 DIAGNOSIS — F419 Anxiety disorder, unspecified: Secondary | ICD-10-CM

## 2012-02-12 HISTORY — DX: Other psychoactive substance abuse, uncomplicated: F19.10

## 2012-02-12 LAB — COMPREHENSIVE METABOLIC PANEL
ALT: 30 U/L (ref 0–35)
AST: 24 U/L (ref 0–37)
Alkaline Phosphatase: 69 U/L (ref 39–117)
CO2: 28 mEq/L (ref 19–32)
Chloride: 101 mEq/L (ref 96–112)
Creatinine, Ser: 0.8 mg/dL (ref 0.50–1.10)
GFR calc non Af Amer: 85 mL/min — ABNORMAL LOW (ref >90)
Potassium: 3.9 mEq/L (ref 3.5–5.1)
Sodium: 137 mEq/L (ref 135–145)
Total Bilirubin: 0.3 mg/dL (ref 0.3–1.2)

## 2012-02-12 LAB — CBC
MCV: 88.5 fL (ref 78.0–100.0)
Platelets: 289 10*3/uL (ref 150–400)
RBC: 4.77 MIL/uL (ref 3.87–5.11)
WBC: 5.8 10*3/uL (ref 4.0–10.5)

## 2012-02-12 MED ORDER — ONDANSETRON HCL 4 MG PO TABS: 4.0000 mg | ORAL_TABLET | Freq: Three times a day (TID) | ORAL | Status: DC | PRN

## 2012-02-12 MED ORDER — VITAMIN B-1 100 MG PO TABS
100.0000 mg | ORAL_TABLET | Freq: Every day | ORAL | Status: DC
  Administered 2012-02-12: 100 mg via ORAL
  Filled 2012-02-12: qty 1

## 2012-02-12 MED ORDER — LORAZEPAM 1 MG PO TABS: 1.0000 mg | ORAL_TABLET | Freq: Three times a day (TID) | ORAL | Status: DC | PRN

## 2012-02-12 MED ORDER — BENZTROPINE MESYLATE 1 MG PO TABS
0.5000 mg | ORAL_TABLET | Freq: Every day | ORAL | Status: DC
  Administered 2012-02-12: 0.5 mg via ORAL
  Filled 2012-02-12: qty 1

## 2012-02-12 MED ORDER — NICOTINE 21 MG/24HR TD PT24: 21.0000 mg | MEDICATED_PATCH | Freq: Every day | TRANSDERMAL | Status: DC

## 2012-02-12 MED ORDER — THIAMINE HCL 100 MG/ML IJ SOLN: 100.0000 mg | Freq: Every day | INTRAMUSCULAR | Status: DC

## 2012-02-12 MED ORDER — LORAZEPAM 1 MG PO TABS: 0.0000 mg | ORAL_TABLET | Freq: Two times a day (BID) | ORAL | Status: DC

## 2012-02-12 MED ORDER — FOLIC ACID 1 MG PO TABS
1.0000 mg | ORAL_TABLET | Freq: Every day | ORAL | Status: DC
  Administered 2012-02-12: 1 mg via ORAL
  Filled 2012-02-12: qty 1

## 2012-02-12 MED ORDER — ACETAMINOPHEN 325 MG PO TABS: 650.0000 mg | ORAL_TABLET | ORAL | Status: DC | PRN

## 2012-02-12 MED ORDER — RISPERIDONE 2 MG PO TABS
3.0000 mg | ORAL_TABLET | Freq: Every day | ORAL | Status: DC
  Administered 2012-02-12: 3 mg via ORAL
  Filled 2012-02-12: qty 1

## 2012-02-12 MED ORDER — DIVALPROEX SODIUM ER 500 MG PO TB24
500.0000 mg | ORAL_TABLET | Freq: Every day | ORAL | Status: DC
  Filled 2012-02-12 (×2): qty 1

## 2012-02-12 MED ORDER — QUETIAPINE FUMARATE 50 MG PO TABS
50.0000 mg | ORAL_TABLET | Freq: Every day | ORAL | Status: DC
  Administered 2012-02-12: 50 mg via ORAL
  Filled 2012-02-12: qty 1

## 2012-02-12 MED ORDER — LORAZEPAM 1 MG PO TABS: 1.0000 mg | ORAL_TABLET | Freq: Four times a day (QID) | ORAL | Status: DC | PRN

## 2012-02-12 MED ORDER — LISINOPRIL 10 MG PO TABS
10.0000 mg | ORAL_TABLET | Freq: Every day | ORAL | Status: DC
  Administered 2012-02-12: 10 mg via ORAL
  Filled 2012-02-12 (×2): qty 1

## 2012-02-12 MED ORDER — LORAZEPAM 2 MG/ML IJ SOLN: 1.0000 mg | Freq: Four times a day (QID) | INTRAMUSCULAR | Status: DC | PRN

## 2012-02-12 MED ORDER — ALUM & MAG HYDROXIDE-SIMETH 200-200-20 MG/5ML PO SUSP: 30.0000 mL | ORAL | Status: DC | PRN

## 2012-02-12 MED ORDER — LORAZEPAM 1 MG PO TABS
0.0000 mg | ORAL_TABLET | Freq: Four times a day (QID) | ORAL | Status: DC
  Administered 2012-02-12: 1 mg via ORAL
  Filled 2012-02-12: qty 1

## 2012-02-12 MED ORDER — ZOLPIDEM TARTRATE 5 MG PO TABS: 5.0000 mg | ORAL_TABLET | Freq: Every evening | ORAL | Status: DC | PRN

## 2012-02-12 MED ORDER — IBUPROFEN 600 MG PO TABS: 600.0000 mg | ORAL_TABLET | Freq: Three times a day (TID) | ORAL | Status: DC | PRN

## 2012-02-12 MED ORDER — ADULT MULTIVITAMIN W/MINERALS CH
1.0000 | ORAL_TABLET | Freq: Every day | ORAL | Status: DC
  Filled 2012-02-12: qty 1

## 2012-02-12 NOTE — ED Notes (Signed)
Pt transferred from triage, SI, plan to OD on pills. Admits to drinking & using crack & pain pills every day.  Pt very guarded, angry, stating she has no place to go.  States the drug dealers are after her, she owes them money.

## 2012-02-12 NOTE — ED Notes (Signed)
Pt wanting detox from alcohol and crack.  Drank alcohol and used crack "all night long" last night.  Pt A&O x 4

## 2012-02-12 NOTE — ED Provider Notes (Signed)
History     CSN: [id number]  Arrival date & time 02/12/12  1509   First MD Initiated Contact with Patient 02/12/12 1832      Chief Complaint  Patient presents with  . Medical Clearance    (Consider location/radiation/quality/duration/timing/severity/associated sxs/prior treatment) HPI Comments: Patient presents today requesting detox from alcohol and requesting placement in an alcohol rehab facility.  She reports that she currently drinks four 40 ounces of beer, a bottle of wine, and some liquor daily.  She was seen on 01/31/12 for the same.  She denies SI or HI.  Last alcohol drink was earlier today.  She has also been using Cocaine regularly.  Last used cocaine last evening.  She has a history of Schizo-affective Schizophrenia and Bipolar.  She states that she has not been taking her Psychiatric medications.    The history is provided by the patient.    Past Medical History  Diagnosis Date  . Arthritis   . Seizures   . Psychiatric illness   . Bipolar 1 disorder   . Schizo-affective schizophrenia   . Obesity   . Substance abuse     Past Surgical History  Procedure Date  . Stomach surgery     Family History  Problem Relation Age of Onset  . Alzheimer's disease Mother   . Depression Mother     History  Substance Use Topics  . Smoking status: Current Every Day Smoker -- 1.0 packs/day    Types: Cigarettes  . Smokeless tobacco: Never Used  . Alcohol Use: 3.6 oz/week    6 Cans of beer per week    OB History    Grav Para Term Preterm Abortions TAB SAB Ect Mult Living                  Review of Systems  Gastrointestinal: Negative for nausea and vomiting.  Neurological: Negative for tremors.  Psychiatric/Behavioral: Negative for suicidal ideas, hallucinations, confusion and self-injury.  All other systems reviewed and are negative.    Allergies  Review of patient's allergies indicates no known allergies.  Home Medications   Current Outpatient Rx  Name   Route  Sig  Dispense  Refill  . BENZTROPINE MESYLATE 0.5 MG PO TABS      Take one tablet, 0.5mg  daily at bedtime. For EPS.   30 tablet   0   . DIVALPROEX SODIUM ER 500 MG PO TB24      Take one tablet (500mg  ER) daily in the morning, and two tablets (1000mg ) daily at bedtime.   90 tablet   0   . LISINOPRIL 10 MG PO TABS   Oral   Take 1 tablet (10 mg total) by mouth daily. For blood pressure.   30 tablet   0   . QUETIAPINE FUMARATE 50 MG PO TABS   Oral   Take 1 tablet (50 mg total) by mouth at bedtime. For sleep and mood stability.   30 tablet   0   . RISPERIDONE 3 MG PO TABS      Take one tablet (3mg ) twice daily in the morning and at bedtime.  For psychosis.   60 tablet   0   . RISPERIDONE MICROSPHERES 50 MG IM SUSR   Intramuscular   Inject 2 mLs (50 mg total) into the muscle every 14 (fourteen) days. For psychosis, last given on 07/01/2011.   1 each   0     BP 144/101  Pulse 91  Temp 98.2 F (36.8 C) (  Oral)  Resp 18  SpO2 97%  LMP 01/13/2012  Physical Exam  Nursing note and vitals reviewed. Constitutional: She appears well-developed and well-nourished. No distress.  HENT:  Head: Normocephalic and atraumatic.  Mouth/Throat: Oropharynx is clear and moist.  Cardiovascular: Normal rate, regular rhythm and normal heart sounds.   Pulmonary/Chest: Effort normal and breath sounds normal.  Neurological: She is alert.  Skin: Skin is warm and dry. She is not diaphoretic.  Psychiatric: Her mood appears anxious. Her speech is rapid and/or pressured and tangential. She is not aggressive and not actively hallucinating. She exhibits a depressed mood. She expresses no homicidal and no suicidal ideation. She expresses no suicidal plans and no homicidal plans.    ED Course  Procedures (including critical care time)   Labs Reviewed  CBC  COMPREHENSIVE METABOLIC PANEL  ETHANOL  URINE RAPID DRUG SCREEN (HOSP PERFORMED)   No results found.   No diagnosis  found.    MDM  Patient requesting detox from alcohol and placement in a an alcohol rehab program.  ACT team has been notified.  CIWA orders have been placed.  Psych holding orders have been placed.  Med rec orders have also been placed.        Pascal Lux [place], PA-C 02/13/12 [phone]

## 2012-02-13 NOTE — BH Assessment (Signed)
Assessment Note   [name] is an 51 y.o. female who presents voluntarily to [facility] via [facility]. Pt sts she called [employer] to take her to [facility]. Pt endorses SI. She denies intent says she could overdose on pills. Pt has no hx of suicide attempts. Per [facility] admissions, pt has hx of schizoaffective disorder and substance abuse. She denies HI and denies [facility]. No delusions noted. Pt currently enrolled with [facility] for med management and community support. Pt reports using crack cocaine and alcohol daily. Pt describes mood as "sad, angry, depressed". Her affect is anxious. She says she is afraid the drug dealers to whom she owes money will hurt her. Pt requests admission to [facility]. Pt says she received her monthly Risperdal injection last week. Pt reports noncompliance with psych meds b/c "I keep losing them". Disposition pending telepsych consult. Axis I: Mood Disorder NOS            Alcohol Abuse            Cannabis Abuse Axis II: Deferred Axis III:  Past Medical History  Diagnosis Date  . Arthritis   . Seizures   . Psychiatric illness   . Bipolar 1 disorder   . Schizo-affective schizophrenia   . Obesity   . Substance abuse    Axis IV: other psychosocial or environmental problems, problems related to social environment and problems with primary support group Axis V: 31-40 impairment in reality testing  Past Medical History:  Past Medical History  Diagnosis Date  . Arthritis   . Seizures   . Psychiatric illness   . Bipolar 1 disorder   . Schizo-affective schizophrenia   . Obesity   . Substance abuse     Past Surgical History  Procedure Date  . Stomach surgery     Family History:  Family History  Problem Relation Age of Onset  . Alzheimer's disease Mother   . Depression Mother     Social History:  reports that she has been smoking Cigarettes.  She has been smoking about 1 pack per day. She has never used smokeless tobacco. She reports that she drinks about 3.6  ounces of alcohol per week. She reports that she uses illicit drugs ("Crack" cocaine and Cocaine) about 5 times per week.  Additional Social History:  Alcohol / Drug Use Pain Medications: see PTA meds Prescriptions: see PTA meds Over the Counter: see PTA meds History of alcohol / drug use?: Yes Substance #1 Name of Substance 1: crack cocaine 1 - Age of First Use: 17 1 - Amount (size/oz): varies 1 - Frequency: daily 1 - Duration:  years 1 - Last Use / Amount: 02/11/12 - unsure of amount Substance #2 Name of Substance 2: alcohol 2 - Age of First Use: 17 2 - Amount (size/oz): "anything I can get my hands on" 2 - Frequency: daily 2 - Duration: several yrs 2 - Last Use / Amount: 11/31/13 - unknown amount  CIWA: CIWA-Ar BP: 178/109 mmHg Pulse Rate: 86  Nausea and Vomiting: no nausea and no vomiting Tactile Disturbances: none Tremor: no tremor Auditory Disturbances: not present Paroxysmal Sweats: no sweat visible Visual Disturbances: not present Anxiety: moderately anxious, or guarded, so anxiety is inferred Headache, Fullness in Head: none present Agitation: two Orientation and Clouding of Sensorium: oriented and can do serial additions CIWA-Ar Total: 6  COWS:    Allergies: No Known Allergies  Home Medications:  (Not in a hospital admission)  OB/GYN Status:  Patient's last  menstrual period was 01/13/2012.  General Assessment Data Location of Assessment: [facility] Living Arrangements: Alone;Other (Comment) (boarding house) Can pt return to current living arrangement?: Yes Admission Status: Voluntary Is patient capable of signing voluntary admission?: Yes Transfer from: Acute Hospital Referral Source: Self/Family/Friend  Education Status Is patient currently in school?: No Current Grade: na Highest grade of school patient has completed: 29 Name of school: then [employer]  Risk to self Suicidal Ideation: Yes-Currently Present Suicidal Intent: No Is patient at risk for  suicide?: No Suicidal Plan?: Yes-Currently Present (pt denies intent but sts she would overdose as suicide means) Specify Current Suicidal Plan: to overdose on pills Access to Means: Yes Specify Access to Suicidal Means: pills What has been your use of drugs/alcohol within the last 12 months?: daily crack and alcohol Previous Attempts/Gestures: No How many times?: 0  Other Self Harm Risks: none Triggers for Past Attempts:  (none) Intentional Self Injurious Behavior: None Family Suicide History: No Recent stressful life event(s): Other (Comment) (use of illicit drugs and drug dealers are after her) Persecutory voices/beliefs?: No Depression: Yes Depression Symptoms: Loss of interest in usual pleasures Substance abuse history and/or treatment for substance abuse?: Yes Suicide prevention information given to non-admitted patients: Not applicable  Risk to Others Homicidal Ideation: No Thoughts of Harm to Others: No Current Homicidal Intent: No Current Homicidal Plan: No Access to Homicidal Means: No Identified Victim: none History of harm to others?: No Assessment of Violence: None Noted Violent Behavior Description: pt calm and cooperatieg Does patient have access to weapons?: No Criminal Charges Pending?: No Does patient have a court date: No  Psychosis Hallucinations: None noted Delusions: None noted  Mental Status Report Appear/Hygiene: Disheveled Eye Contact: Fair Motor Activity: Freedom of movement Speech: Logical/coherent Level of Consciousness: Alert Mood: Depressed;Sad;Fearful Affect: Anxious Anxiety Level: Minimal Thought Processes: Relevant;Coherent Judgement: Impaired Orientation: Person;Place;Time;Situation Obsessive Compulsive Thoughts/Behaviors: None  Cognitive Functioning Concentration: Normal Memory: Recent Intact;Remote Intact IQ: Average Insight: Poor Impulse Control: Poor Appetite: Fair Weight Loss: 0  Weight Gain: 0  Sleep: No Change Total  Hours of Sleep: 5  Vegetative Symptoms: None  ADLScreening [facility] Assessment Services) Patient's cognitive ability adequate to safely complete daily activities?: Yes Patient able to express need for assistance with ADLs?: Yes Independently performs ADLs?: Yes (appropriate for developmental age)  Abuse/Neglect [facility]) Physical Abuse: Denies Verbal Abuse: Denies Sexual Abuse: Denies  Prior Inpatient Therapy Prior Inpatient Therapy: Yes Prior Therapy Dates: 2012 & 2013 Prior Therapy Facilty/Provider(s): [facility], [facility], [facility], [facility] Reason for Treatment: detox,schizoaffective d/o  Prior Outpatient Therapy Prior Outpatient Therapy: Yes Prior Therapy Dates: currently Prior Therapy Facilty/Provider(s): [facility] Reason for Treatment: ACT and med managment.  ADL Screening (condition at time of admission) Patient's cognitive ability adequate to safely complete daily activities?: Yes Patient able to express need for assistance with ADLs?: Yes Independently performs ADLs?: Yes (appropriate for developmental age) Weakness of Legs: None Weakness of Arms/Hands: None  Home Assistive Devices/Equipment Home Assistive Devices/Equipment: None    Abuse/Neglect Assessment (Assessment to be complete while patient is alone) Physical Abuse: Denies Verbal Abuse: Denies Sexual Abuse: Denies Exploitation of patient/patient's resources: Denies Self-Neglect: Denies Values / Beliefs Cultural Requests During Hospitalization: None Spiritual Requests During Hospitalization: None   Advance Directives (For Healthcare) Advance Directive: Patient does not have advance directive;Patient would not like information    Additional Information 1:1 In Past 12 Months?: No CIRT Risk: No Elopement Risk: No Does patient have medical clearance?: Yes  Disposition:  Disposition Disposition of Patient:  (pending telepsych)  On Site Evaluation by:   Reviewed with Physician:     Donnamarie Rossetti  P 02/13/2012 12:14 AM

## 2012-02-13 NOTE — ED Notes (Signed)
Pt to be discharge home with bus pass. Has attempted to call CM for ride but hasn't gotten an answer.

## 2012-02-13 NOTE — ED Provider Notes (Signed)
0235.  Pt stable, NAD.  She has been evaluated by Dr. Jacky Kindle (telepsych) provider.  Dr. Jacky Kindle has obtained a history that does note cocaine abuse but Ms. Silbaugh admitted to be hiding from a drug dealer that she owes money to.  She does not meet criteria for admission.  My concern is that she may not be safe after the leaving the ER.  I placed a consult to social work to further assess her home situation and if necessary assist her in finding a shelter or involving the police so she may be safe after leaving the ER.  The consult will be completed after arrival of the day staff at 0830.  Tobin Chad, MD 02/13/12 [phone]

## 2012-02-13 NOTE — ED Provider Notes (Signed)
Dr Lorenso Courier and Act team indicate pt has been psych cleared/stable for d/c to home. Act team has provided community resource list/guide.      Suzi Roots, MD 02/13/12 [phone]

## 2012-02-14 NOTE — ED Provider Notes (Signed)
Medical screening examination/treatment/procedure(s) were performed by non-physician practitioner and as supervising physician I was immediately available for consultation/collaboration.  Jessicaann Overbaugh, MD 02/14/12 1455 

## 2013-03-20 ENCOUNTER — Encounter (HOSPITAL_COMMUNITY): Payer: Self-pay | Admitting: Emergency Medicine

## 2013-03-20 ENCOUNTER — Emergency Department (HOSPITAL_COMMUNITY)
Admission: EM | Admit: 2013-03-20 | Discharge: 2013-03-20 | Disposition: A | Discharge status: Home / Self Care | Payer: Medicare Other | Attending: Emergency Medicine | Admitting: Emergency Medicine

## 2013-03-20 DIAGNOSIS — F17200 Nicotine dependence, unspecified, uncomplicated: Secondary | ICD-10-CM | POA: Insufficient documentation

## 2013-03-20 DIAGNOSIS — E669 Obesity, unspecified: Secondary | ICD-10-CM | POA: Insufficient documentation

## 2013-03-20 DIAGNOSIS — F319 Bipolar disorder, unspecified: Secondary | ICD-10-CM | POA: Insufficient documentation

## 2013-03-20 DIAGNOSIS — R109 Unspecified abdominal pain: Secondary | ICD-10-CM

## 2013-03-20 DIAGNOSIS — Z3202 Encounter for pregnancy test, result negative: Secondary | ICD-10-CM | POA: Insufficient documentation

## 2013-03-20 DIAGNOSIS — F259 Schizoaffective disorder, unspecified: Secondary | ICD-10-CM | POA: Insufficient documentation

## 2013-03-20 DIAGNOSIS — R1084 Generalized abdominal pain: Secondary | ICD-10-CM | POA: Insufficient documentation

## 2013-03-20 DIAGNOSIS — G40909 Epilepsy, unspecified, not intractable, without status epilepticus: Secondary | ICD-10-CM | POA: Insufficient documentation

## 2013-03-20 DIAGNOSIS — Z8739 Personal history of other diseases of the musculoskeletal system and connective tissue: Secondary | ICD-10-CM | POA: Insufficient documentation

## 2013-03-20 DIAGNOSIS — Z79899 Other long term (current) drug therapy: Secondary | ICD-10-CM | POA: Insufficient documentation

## 2013-03-20 LAB — COMPREHENSIVE METABOLIC PANEL
ALBUMIN: 3.3 g/dL — AB (ref 3.5–5.2)
ALK PHOS: 49 U/L (ref 39–117)
ALT: 19 U/L (ref 0–35)
AST: 14 U/L (ref 0–37)
BUN: 16 mg/dL (ref 6–23)
CALCIUM: 9.1 mg/dL (ref 8.4–10.5)
CO2: 28 mEq/L (ref 19–32)
CREATININE: 0.7 mg/dL (ref 0.50–1.10)
Chloride: 101 mEq/L (ref 96–112)
GFR calc Af Amer: >90 mL/min (ref >90)
GFR calc non Af Amer: >90 mL/min (ref >90)
Glucose, Bld: 115 mg/dL — ABNORMAL HIGH (ref 70–99)
POTASSIUM: 4.3 meq/L (ref 3.7–5.3)
Sodium: 139 mEq/L (ref 137–147)
TOTAL PROTEIN: 6.9 g/dL (ref 6.0–8.3)
Total Bilirubin: <0.2 mg/dL — ABNORMAL LOW (ref 0.3–1.2)

## 2013-03-20 LAB — URINALYSIS, ROUTINE W REFLEX MICROSCOPIC
Bilirubin Urine: NEGATIVE
Glucose, UA: NEGATIVE mg/dL
Hgb urine dipstick: NEGATIVE
Ketones, ur: NEGATIVE mg/dL
LEUKOCYTES UA: NEGATIVE
NITRITE: NEGATIVE
PH: 5.5 (ref 5.0–8.0)
Protein, ur: NEGATIVE mg/dL
SPECIFIC GRAVITY, URINE: 1.041 — AB (ref 1.005–1.030)
UROBILINOGEN UA: 0.2 mg/dL (ref 0.0–1.0)

## 2013-03-20 LAB — CBC WITH DIFFERENTIAL/PLATELET
BASOS PCT: 0 % (ref 0–1)
Basophils Absolute: 0 10*3/uL (ref 0.0–0.1)
EOS ABS: 0.1 10*3/uL (ref 0.0–0.7)
EOS PCT: 1 % (ref 0–5)
HCT: 36.9 % (ref 36.0–46.0)
HEMOGLOBIN: 12.4 g/dL (ref 12.0–15.0)
Lymphocytes Relative: 50 % — ABNORMAL HIGH (ref 12–46)
Lymphs Abs: 2.8 10*3/uL (ref 0.7–4.0)
MCH: 29.3 pg (ref 26.0–34.0)
MCHC: 33.6 g/dL (ref 30.0–36.0)
MCV: 87.2 fL (ref 78.0–100.0)
MONOS PCT: 9 % (ref 3–12)
Monocytes Absolute: 0.5 10*3/uL (ref 0.1–1.0)
NEUTROS PCT: 40 % — AB (ref 43–77)
Neutro Abs: 2.2 10*3/uL (ref 1.7–7.7)
PLATELETS: 249 10*3/uL (ref 150–400)
RBC: 4.23 MIL/uL (ref 3.87–5.11)
RDW: 14.4 % (ref 11.5–15.5)
WBC: 5.5 10*3/uL (ref 4.0–10.5)

## 2013-03-20 LAB — LIPASE, BLOOD: LIPASE: 22 U/L (ref 11–59)

## 2013-03-20 LAB — PREGNANCY, URINE: Preg Test, Ur: NEGATIVE

## 2013-03-20 NOTE — Discharge Instructions (Signed)
Your test results are all normal here today. Refer to attached documents for more information. Return to the ED with worsening or concerning symptoms.

## 2013-03-20 NOTE — ED Notes (Signed)
Pt continues to request food.  Notified patient that we are waiting for labs before any po can be given.

## 2013-03-20 NOTE — ED Notes (Signed)
Per EMS: [facility] from [facility] with complaints of pain all over from walking a lot. Pt does specify pain to lower abdomen for 1 week. Denies n/v/d.

## 2013-03-20 NOTE — ED Notes (Signed)
Patient stated she was hungry. Gave patient sandwich and juice. Patient expressed to this tech that she didn't want a cold sandwich she would like a hot breakfast. I explained to patient that this was not like the admitting floors and we do not order everyone trays in the ED. Patient also stated that she was in need of clothes since it was cold. I explained to patient that we do not provide clothes only paper scrubs. Patient then stated that she wanted transportation [facility]. I told patient that we might could get her a bus pass she stated that she didn't know the bus routes and would prefer a taxi voucher. Patient was very displeased with my answers to her request. I notified the PA about patients request.

## 2013-03-20 NOTE — ED Notes (Signed)
Bed: [id number] Expected date:  Expected time:  Means of arrival:  Comments: EMS/53 yo ambulatory [facility] with [facility]/o pain all over

## 2013-03-20 NOTE — ED Provider Notes (Signed)
CSN: [id number]     Arrival date & time 03/20/13  4098 History   First MD Initiated Contact with Patient 03/20/13 [phone]     Chief Complaint  Patient presents with  . Abdominal Pain   (Consider location/radiation/quality/duration/timing/severity/associated sxs/prior Treatment) Patient is a 53 y.o. female presenting with abdominal pain.  Abdominal Pain Pain location:  Generalized Pain quality: aching   Pain radiates to:  Does not radiate Pain severity:  Moderate Onset quality:  Gradual Duration:  1 week Timing:  Constant Progression:  Unchanged Chronicity:  New Context: not alcohol use, not awakening from sleep, not diet changes, not recent illness and not sick contacts   Relieved by:  Nothing Worsened by:  Nothing tried Ineffective treatments:  None tried Associated symptoms: no chest pain, no chills, no diarrhea, no dysuria, no fatigue, no fever, no nausea, no shortness of breath and no vomiting   Risk factors: obesity   Risk factors: no alcohol abuse, no aspirin use and no NSAID use     Past Medical History  Diagnosis Date  . Arthritis   . Seizures   . Psychiatric illness   . Bipolar 1 disorder   . Schizo-affective schizophrenia   . Obesity   . Substance abuse    Past Surgical History  Procedure Laterality Date  . Stomach surgery     Family History  Problem Relation Age of Onset  . Alzheimer's disease Mother   . Depression Mother    History  Substance Use Topics  . Smoking status: Current Every Day Smoker -- 1.00 packs/day    Types: Cigarettes  . Smokeless tobacco: Never Used  . Alcohol Use: 3.6 oz/week    6 Cans of beer per week   OB History   Grav Para Term Preterm Abortions TAB SAB Ect Mult Living                 Review of Systems  Constitutional: Negative for fever, chills and fatigue.  HENT: Negative for trouble swallowing.   Eyes: Negative for visual disturbance.  Respiratory: Negative for shortness of breath.   Cardiovascular: Negative for chest  pain and palpitations.  Gastrointestinal: Positive for abdominal pain. Negative for nausea, vomiting and diarrhea.  Genitourinary: Negative for dysuria and difficulty urinating.  Musculoskeletal: Negative for arthralgias and neck pain.  Skin: Negative for color change.  Neurological: Negative for dizziness and weakness.  Psychiatric/Behavioral: Negative for dysphoric mood.    Allergies  Review of patient's allergies indicates no known allergies.  Home Medications   Current Outpatient Rx  Name  Route  Sig  Dispense  Refill  . benztropine (COGENTIN) 0.5 MG tablet      Take one tablet, 0.5mg  daily at bedtime. For EPS.   30 tablet   0   . divalproex (DEPAKOTE ER) 500 MG 24 hr tablet      Take one tablet (500mg  ER) daily in the morning, and two tablets (1000mg ) daily at bedtime.   90 tablet   0   . QUEtiapine (SEROQUEL) 50 MG tablet   Oral   Take 1 tablet (50 mg total) by mouth at bedtime. For sleep and mood stability.   30 tablet   0   . risperiDONE (RISPERDAL) 3 MG tablet      Take one tablet (3mg ) twice daily in the morning and at bedtime.  For psychosis.   60 tablet   0   . risperiDONE microspheres (RISPERDAL CONSTA) 50 MG injection   Intramuscular   Inject 2 mLs (  50 mg total) into the muscle every 14 (fourteen) days. For psychosis, last given on 07/01/2011.   1 each   0    BP 110/74  Pulse 94  Temp(Src) 98.7 F (37.1 C) (Oral)  Resp 20  Ht 5\' 7"  (1.702 m)  Wt 215 lb (97.523 kg)  BMI 33.67 kg/m2  SpO2 96%  LMP 01/13/2012 Physical Exam  Nursing note and vitals reviewed. Constitutional: She is oriented to person, place, and time. She appears well-developed and well-nourished. No distress.  HENT:  Head: Normocephalic and atraumatic.  Eyes: Conjunctivae and EOM are normal.  Neck: Normal range of motion.  Cardiovascular: Normal rate and regular rhythm.  Exam reveals no gallop and no friction rub.   No murmur heard. Pulmonary/Chest: Effort normal and breath  sounds normal. She has no wheezes. She has no rales. She exhibits no tenderness.  Abdominal: Soft. She exhibits no distension. There is no tenderness. There is no rebound and no guarding.  Musculoskeletal: Normal range of motion.  Neurological: She is alert and oriented to person, place, and time. Coordination normal.  Speech is goal-oriented. Moves limbs without ataxia.   Skin: Skin is warm and dry.  Psychiatric:  Pleasant mood. Odd behavior. Tangential thought process.     ED Course  Procedures (including critical care time) Labs Review Labs Reviewed  CBC WITH DIFFERENTIAL - Abnormal; Notable for the following:    Neutrophils Relative % 40 (*)    Lymphocytes Relative 50 (*)    All other components within normal limits  COMPREHENSIVE METABOLIC PANEL - Abnormal; Notable for the following:    Glucose, Bld 115 (*)    Albumin 3.3 (*)    Total Bilirubin <0.2 (*)    All other components within normal limits  URINALYSIS, ROUTINE W REFLEX MICROSCOPIC - Abnormal; Notable for the following:    Color, Urine AMBER (*)    APPearance CLOUDY (*)    Specific Gravity, Urine 1.041 (*)    All other components within normal limits  URINE CULTURE  LIPASE, BLOOD  PREGNANCY, URINE   Imaging Review No results found.  EKG Interpretation   None       MDM   1. Abdominal pain     6:53 AM Labs and urinalysis pending. Vitals stable and patient afebrile.   9:27 AM Patient's exhibiting tangential speech and cannot focus on her complaint. Labs and urinalysis unremarkable for acute changes. Vitals stable and patient afebrile. Patient ate a sandwich and drank juice while she was here. She has no nausea or vomiting at this time. Patient will be discharged and return to the shelter where she came from. No further evaluation needed at this time.    Emilia BeckKaitlyn Marcheta Horsey, PA-C 03/22/13 0002

## 2013-03-21 ENCOUNTER — Emergency Department (HOSPITAL_COMMUNITY)
Admission: EM | Admit: 2013-03-21 | Discharge: 2013-03-21 | Disposition: A | Discharge status: Home / Self Care | Payer: Medicare Other | Attending: Emergency Medicine | Admitting: Emergency Medicine

## 2013-03-21 ENCOUNTER — Encounter (HOSPITAL_COMMUNITY): Payer: Self-pay | Admitting: Emergency Medicine

## 2013-03-21 DIAGNOSIS — F411 Generalized anxiety disorder: Secondary | ICD-10-CM | POA: Insufficient documentation

## 2013-03-21 DIAGNOSIS — F0630 Mood disorder due to known physiological condition, unspecified: Secondary | ICD-10-CM | POA: Insufficient documentation

## 2013-03-21 DIAGNOSIS — F17200 Nicotine dependence, unspecified, uncomplicated: Secondary | ICD-10-CM | POA: Insufficient documentation

## 2013-03-21 DIAGNOSIS — Z9119 Patient's noncompliance with other medical treatment and regimen: Secondary | ICD-10-CM | POA: Insufficient documentation

## 2013-03-21 DIAGNOSIS — F209 Schizophrenia, unspecified: Secondary | ICD-10-CM

## 2013-03-21 DIAGNOSIS — F29 Unspecified psychosis not due to a substance or known physiological condition: Secondary | ICD-10-CM

## 2013-03-21 DIAGNOSIS — M549 Dorsalgia, unspecified: Secondary | ICD-10-CM | POA: Insufficient documentation

## 2013-03-21 DIAGNOSIS — I10 Essential (primary) hypertension: Secondary | ICD-10-CM | POA: Insufficient documentation

## 2013-03-21 DIAGNOSIS — F1910 Other psychoactive substance abuse, uncomplicated: Secondary | ICD-10-CM | POA: Insufficient documentation

## 2013-03-21 DIAGNOSIS — G40909 Epilepsy, unspecified, not intractable, without status epilepticus: Secondary | ICD-10-CM | POA: Insufficient documentation

## 2013-03-21 DIAGNOSIS — M129 Arthropathy, unspecified: Secondary | ICD-10-CM | POA: Insufficient documentation

## 2013-03-21 DIAGNOSIS — R109 Unspecified abdominal pain: Secondary | ICD-10-CM | POA: Insufficient documentation

## 2013-03-21 DIAGNOSIS — F319 Bipolar disorder, unspecified: Secondary | ICD-10-CM | POA: Insufficient documentation

## 2013-03-21 DIAGNOSIS — Z91199 Patient's noncompliance with other medical treatment and regimen due to unspecified reason: Secondary | ICD-10-CM | POA: Insufficient documentation

## 2013-03-21 DIAGNOSIS — Z79899 Other long term (current) drug therapy: Secondary | ICD-10-CM | POA: Insufficient documentation

## 2013-03-21 DIAGNOSIS — F259 Schizoaffective disorder, unspecified: Secondary | ICD-10-CM | POA: Insufficient documentation

## 2013-03-21 LAB — CBC WITH DIFFERENTIAL/PLATELET
BASOS PCT: 0 % (ref 0–1)
Basophils Absolute: 0 10*3/uL (ref 0.0–0.1)
EOS ABS: 0 10*3/uL (ref 0.0–0.7)
Eosinophils Relative: 1 % (ref 0–5)
HEMATOCRIT: 37.7 % (ref 36.0–46.0)
HEMOGLOBIN: 12.6 g/dL (ref 12.0–15.0)
LYMPHS ABS: 2.1 10*3/uL (ref 0.7–4.0)
Lymphocytes Relative: 40 % (ref 12–46)
MCH: 29.1 pg (ref 26.0–34.0)
MCHC: 33.4 g/dL (ref 30.0–36.0)
MCV: 87.1 fL (ref 78.0–100.0)
MONO ABS: 0.6 10*3/uL (ref 0.1–1.0)
MONOS PCT: 11 % (ref 3–12)
Neutro Abs: 2.6 10*3/uL (ref 1.7–7.7)
Neutrophils Relative %: 48 % (ref 43–77)
Platelets: 254 10*3/uL (ref 150–400)
RBC: 4.33 MIL/uL (ref 3.87–5.11)
RDW: 14.2 % (ref 11.5–15.5)
WBC: 5.3 10*3/uL (ref 4.0–10.5)

## 2013-03-21 LAB — COMPREHENSIVE METABOLIC PANEL
ALBUMIN: 3.5 g/dL (ref 3.5–5.2)
ALK PHOS: 53 U/L (ref 39–117)
ALT: 21 U/L (ref 0–35)
AST: 14 U/L (ref 0–37)
BUN: 15 mg/dL (ref 6–23)
CHLORIDE: 101 meq/L (ref 96–112)
CO2: 27 mEq/L (ref 19–32)
CREATININE: 0.62 mg/dL (ref 0.50–1.10)
Calcium: 9.7 mg/dL (ref 8.4–10.5)
GFR calc non Af Amer: >90 mL/min (ref >90)
GLUCOSE: 81 mg/dL (ref 70–99)
POTASSIUM: 4.5 meq/L (ref 3.7–5.3)
Sodium: 139 mEq/L (ref 137–147)
TOTAL PROTEIN: 7.3 g/dL (ref 6.0–8.3)

## 2013-03-21 LAB — RAPID URINE DRUG SCREEN, HOSP PERFORMED
Amphetamines: NOT DETECTED
Barbiturates: NOT DETECTED
Benzodiazepines: NOT DETECTED
COCAINE: NOT DETECTED
OPIATES: NOT DETECTED
TETRAHYDROCANNABINOL: NOT DETECTED

## 2013-03-21 LAB — URINE CULTURE: Special Requests: NORMAL

## 2013-03-21 LAB — ETHANOL: Alcohol, Ethyl (B): <11 mg/dL (ref 0–11)

## 2013-03-21 MED ORDER — IBUPROFEN 200 MG PO TABS
600.0000 mg | ORAL_TABLET | Freq: Three times a day (TID) | ORAL | Status: DC | PRN
  Administered 2013-03-21: 600 mg via ORAL
  Filled 2013-03-21: qty 3

## 2013-03-21 MED ORDER — ONDANSETRON HCL 4 MG PO TABS: 4.0000 mg | ORAL_TABLET | Freq: Three times a day (TID) | ORAL | Status: DC | PRN

## 2013-03-21 MED ORDER — ZOLPIDEM TARTRATE 5 MG PO TABS: 5.0000 mg | ORAL_TABLET | Freq: Every evening | ORAL | Status: DC | PRN

## 2013-03-21 MED ORDER — ACETAMINOPHEN 325 MG PO TABS: 650.0000 mg | ORAL_TABLET | ORAL | Status: DC | PRN

## 2013-03-21 MED ORDER — NICOTINE 21 MG/24HR TD PT24
21.0000 mg | MEDICATED_PATCH | Freq: Every day | TRANSDERMAL | Status: DC
  Administered 2013-03-21: 21 mg via TRANSDERMAL
  Filled 2013-03-21: qty 1

## 2013-03-21 MED ORDER — DIVALPROEX SODIUM ER 500 MG PO TB24
500.0000 mg | ORAL_TABLET | Freq: Every day | ORAL | Status: DC
  Administered 2013-03-21: 500 mg via ORAL
  Filled 2013-03-21: qty 1

## 2013-03-21 MED ORDER — QUETIAPINE FUMARATE 50 MG PO TABS: 50.0000 mg | ORAL_TABLET | Freq: Every day | ORAL | Status: DC

## 2013-03-21 MED ORDER — RISPERIDONE 2 MG PO TABS
3.0000 mg | ORAL_TABLET | Freq: Two times a day (BID) | ORAL | Status: DC
  Administered 2013-03-21: 10:00:00 3 mg via ORAL
  Filled 2013-03-21 (×2): qty 1

## 2013-03-21 MED ORDER — BENZTROPINE MESYLATE 1 MG PO TABS: 0.5000 mg | ORAL_TABLET | Freq: Every day | ORAL | Status: DC

## 2013-03-21 NOTE — Progress Notes (Signed)
Per, Dr. Ladona Ridgelaylor the patient is psychiatrically stable for discharge.  The patients ACTT Team Thayer Ohm(Chris [phone])  reports that someone from her team will be coming to pick up the patient at 1:00pm. The patients ACTT Team is [facility].  The ACTT Team reports that the patient received her shot of psychiatric medication yesterday.

## 2013-03-21 NOTE — ED Provider Notes (Signed)
CSN: [id number]     Arrival date & time 03/21/13  4098 History   First MD Initiated Contact with Patient 03/21/13 [id number]     Chief Complaint  Patient presents with  . Abdominal Pain  . Medical Clearance   (Consider location/radiation/quality/duration/timing/severity/associated sxs/prior Treatment) HPI [name] is a 53 y.o. female who presents to emergency department requesting medical clearance. Patient states that she has history of schizophrenia and she is currently not taking any of her medications. States she ran out and has not seen a psychiatrist for refill. Patient states she feels anxious and depressed. She feels like everyone at the shoulder worse with standing or trying to kill her. Patient has been hearing voices and states she thinks it's God talking to her. Patient states she's also having pain in her abdomen and her back. She states that she has had it for several days. She was seen here for the same and was discharged home. 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 Patient states she quit drinking alcohol and doing drugs a month ago. She denies any suicidal ideations or homicidal ideations. She denies any visual hallucinations. She does not recall medications that she was on. Patient states she is homeless staying at a shelter.   Past Medical History  Diagnosis Date  . Arthritis   . Seizures   . Psychiatric illness   . Bipolar 1 disorder   . Schizo-affective schizophrenia   . Obesity   . Substance abuse    Past Surgical History  Procedure Laterality Date  . Stomach surgery     Family History  Problem Relation Age of Onset  . Alzheimer's disease Mother   . Depression Mother    History  Substance Use Topics  . Smoking status: Current Every Day Smoker -- 1.00 packs/day    Types: Cigarettes  . Smokeless tobacco: Never Used  . Alcohol Use: 3.6 oz/week    6 Cans of beer per week   OB History   Grav  Para Term Preterm Abortions TAB SAB Ect Mult Living                 Review of Systems  Constitutional: Negative for fever and chills.  Respiratory: Negative for cough, chest tightness and shortness of breath.   Cardiovascular: Negative for chest pain, palpitations and leg swelling.  Gastrointestinal: Positive for abdominal pain. Negative for nausea, vomiting and diarrhea.  Genitourinary: Negative for dysuria, frequency, flank pain, vaginal bleeding, vaginal discharge, vaginal pain and pelvic pain.  Musculoskeletal: Positive for back pain. Negative for arthralgias, myalgias, neck pain and neck stiffness.  Skin: Negative for rash.  Neurological: Negative for dizziness, weakness and headaches.  Psychiatric/Behavioral: Positive for hallucinations and sleep disturbance. Negative for suicidal ideas. The patient is nervous/anxious.   All other systems reviewed and are negative.    Allergies  Review of patient's allergies indicates no known allergies.  Home Medications   Current Outpatient Rx  Name  Route  Sig  Dispense  Refill  . amLODipine (NORVASC) 5 MG tablet   Oral   Take 1 tablet by mouth daily.         . benztropine (COGENTIN) 0.5 MG tablet      Take one tablet, 0.5mg  daily at bedtime. For EPS.   30 tablet   0   . divalproex (DEPAKOTE ER) 500 MG 24 hr tablet      Take one tablet (500mg  ER)  daily in the morning, and two tablets (1000mg ) daily at bedtime.   90 tablet   0   . QUEtiapine (SEROQUEL) 50 MG tablet   Oral   Take 1 tablet (50 mg total) by mouth at bedtime. For sleep and mood stability.   30 tablet   0   . risperiDONE (RISPERDAL) 3 MG tablet      Take one tablet (3mg ) twice daily in the morning and at bedtime.  For psychosis.   60 tablet   0   . risperiDONE microspheres (RISPERDAL CONSTA) 50 MG injection   Intramuscular   Inject 2 mLs (50 mg total) into the muscle every 14 (fourteen) days. For psychosis, last given on 07/01/2011.   1 each   0    BP  134/114  Pulse 96  Temp(Src) 97.3 F (36.3 C) (Oral)  Resp 18  SpO2 99%  LMP 01/13/2012 Physical Exam  Nursing note and vitals reviewed. Constitutional: She is oriented to person, place, and time. She appears well-developed and well-nourished. No distress.  HENT:  Head: Normocephalic.  Eyes: Conjunctivae are normal.  Neck: Neck supple.  Cardiovascular: Normal rate, regular rhythm and normal heart sounds.   Pulmonary/Chest: Effort normal and breath sounds normal. No respiratory distress. She has no wheezes. She has no rales.  Abdominal: Soft. Bowel sounds are normal. She exhibits no distension. There is no tenderness. There is no rebound.  Musculoskeletal: She exhibits no edema.  Neurological: She is alert and oriented to person, place, and time.  Skin: Skin is warm and dry.  Psychiatric: Her speech is rapid and/or pressured and tangential. Thought content is paranoid and delusional. She exhibits a depressed mood.    ED Course  Procedures (including critical care time) Labs Review Labs Reviewed  COMPREHENSIVE METABOLIC PANEL - Abnormal; Notable for the following:    Total Bilirubin <0.2 (*)    All other components within normal limits  CBC WITH DIFFERENTIAL  URINE RAPID DRUG SCREEN (HOSP PERFORMED)  ETHANOL   Imaging Review No results found.  EKG Interpretation   None       MDM   1. Schizoaffective disorder   2. Schizophrenia   3. Psychotic disorder   4. Hypertension     The patient with history of schizophrenia and here for medical clearance. She's also complaining of abdominal pain which she reports is chronic. Patient states she has been off of her medications for several days. She states she is here wheezes, shoes feel like people are together. She is requesting to see a psychiatrist. Patient's labs are unremarkable. Iwill get TTS to assess her.  Filed Vitals:   03/21/13 0841 03/21/13 0934 03/21/13 0936 03/21/13 1334  BP: 134/114 125/40 125/40 174/117   Pulse: 96 90  78  Temp: 97.3 F (36.3 C)   98.2 F (36.8 C)  TempSrc: Oral   Oral  Resp: 18   20  SpO2: 99%   99%       Lottie Musselatyana A Burnie Hank, PA-C 03/21/13 1614

## 2013-03-21 NOTE — Consult Note (Signed)
Note reviewed.

## 2013-03-21 NOTE — ED Notes (Addendum)
Per EMS- Patient is homeless. Patient c/o abdominal pain and is also requesting medical clearance. Patient states she does not use alcohol or crack anymore,but is requesting additional detox. Patient also states she has not been taking her psych medications because she is out of them. Patient talking nonstop and states the white girls at the shelter are trying to kill her. Patient states she was at the psych ED yesterday and was discharged. Patient voicing anger about being discharged yesterday. Patient states she has been hearing God talk to her. Patient denies any visual hallucinations. Patient denies SI/HI.

## 2013-03-21 NOTE — ED Notes (Signed)
Pt. Belongings are

## 2013-03-21 NOTE — Discharge Instructions (Signed)
Schizoaffective Disorder  Schizoaffective disorder is a condition with a combination of features of schizophrenia and a mood disorder. There are symptoms of a psychotic disorder such as distorted thinking, hallucinations or delusions (schizophrenia feature) and depression or mania (mood disorder feature). There are 2 subtypes based on the mood component:    Bipolar Type.   Depressive Type.  CAUSES   Schizoaffective disorder, like schizophrenia, appears to have distinct genetic links. It is unknown as to what exactly causes the disorder. Some experts believe it involves brain chemistry such as an imbalance of serotonin and dopamine in the brain. These naturally occurring chemicals help relay electronic signals in the brain and help regulate mood.  Other experts have speculated whether fetal exposure to toxins or viral illness, or even birth complications may play a role. However, there is no proof of this relationship.  SYMPTOMS   Symptoms are those of both schizophrenia and mood disorders. These may include:   Hearing, seeing, or feeling things that are not there (hallucinations).   False beliefs (delusions).   Not taking care of oneself (for example, not bathing or grooming).   Speaking in a way that makes no sense to others.   Withdrawing or feeling isolated from other people.   Thoughts that race from one idea to the next.   Feelings of sadness, guilt, hopelessness, and anxiety.   Feelings of being excessively happy, powerful, or energetic.   Feeling drained of energy.   Losing or gaining weight.   Being unable to concentrate.   Sleeping more or less than normal.  DIAGNOSIS   The diagnosis is made when the patient has features of both illnesses. The patient does not strictly have schizophrenia or a mood disorder alone. Unfortunately, determining if a patient has 2 separate illnesses (schizophrenia or a mood disorder), a combination of illnesses (schizophrenia and a mood disorder), or perhaps even a  separate illness apart from schizophrenia or a mood disorder is difficult.  An accurate diagnosis has 3 key parts:   The patient clearly has a major depressive disorder or mania.   They meet the criteria for schizophrenia (distorted thinking, hallucinations or delusions).   The patient must have psychosis for at least 2 weeks without a mood disorder.  The diagnosis of this disorder frequently requires several evaluations. Caregivers will evaluate the patient's symptoms over a length of time. The diagnosis can be made when the following are observed:   An uninterrupted period of mental illness (possibly lasting weeks or months) occurs during which a major depressive episode, a manic episode (both are mood disorder components), or a mixed episode occurs with symptoms of schizophrenia. A major depressive episode must include a depressed mood. Also:   This uninterrupted period of illness includes a minimum 2 week episode of active distorted thinking, hallucinations or delusions (schizophrenia feature).   During this same 2 week (or greater) period, the patient has no mood changes of depression or mania.   Outside of the specific 2 week period noted above, mood episodes are present for a big part of the total active and lingering periods of illness.   The disturbance is not due to the direct effects of a substance (such as street drugs or prescription medications) or a general medical condition.   The bipolar type is diagnosed if the disturbance includes a period of mania, a major depressive episode or a mixed episode.   The depressive type is diagnosed if the problem includes only major depressive episodes.  TREATMENT     prescribed to help with psychotic symptoms, stabilize  mood and treat depression. Psychotherapy can help curb distorted thoughts, teach appropriate social skills and decrease social isolation. °Medications may include: °· Antipsychotics. These are also called neuroleptics. These are prescribed to help with psychotic symptoms such as delusions, paranoia and hallucinations. Common medications in this category include clozapine (Clozaril), risperidone (Risperdal) and olanzapine (Zyprexa). °· Mood-stabilizing medications. When the schizoaffective disorder is bipolar-type, mood stabilizers can level out the highs and lows of bipolar disorder, also known as manic depression. People with bipolar disorder have episodes of mania and depressed mood. Examples of mood stabilizers include lithium (Eskalith, Lithobid) and divalproex (Depakote). °· Antidepressants. When depression is the underlying mood disorder, antidepressants can help with feelings of sadness, hopelessness, or difficulty with sleep and concentration. Common medications include citalopram (Celexa), fluoxetine and escitalopram (Lexapro). °Non-medication treatment may include: °· Psychotherapy and counseling. Building a trusting relationship in therapy can help people with schizoaffective disorder better understand their condition and feel hopeful about their future. Effective sessions focus on real-life plans, problems, and relationships. Skills and behaviors specific to the home or workplace may be discussed. °· Family or group therapy. Treatment can be more effective when people with schizoaffective disorder can discuss their problems with others. Supportive group settings can help decrease social isolation and provide a reality check during periods of psychosis. °In general, people with schizoaffective disorder have a better prognosis than people with schizophrenia, but not as good as people with mood disorders only. Long-term treatment is necessary The prognosis varies from person to person. °HOME CARE  INSTRUCTIONS  °· Take your medicine as prescribed, even when you are feeling and thinking well. °· Participate in individual and group counseling as recommended by your caregiver. °SEEK MEDICAL CARE IF:  °· You feel that you are experiencing side effects from prescription medications. °· Contact your caregiver if you feel that symptoms are worsening despite using medication as prescribed and participating in counseling/group therapy sessions. °SEEK IMMEDIATE MEDICAL CARE IF:  °You feel an urge to hurt yourself or are thinking about committing suicide. This is an emergency and you should be evaluated immediately. °MAKE SURE YOU:  °· Understand these instructions. °· Will watch your condition. °· Will get help right away if you are not doing well or get worse. °Document Released: 07/11/2006 Document Revised: 05/23/2011 Document Reviewed: 10/12/2012 °ExitCare® Patient Information ©2014 [employer]. ° °

## 2013-03-21 NOTE — Consult Note (Signed)
[facility] Face-to-Face Psychiatry Consult   Reason for Consult:  History schizophrenia Referring Physician:  EDP  [name] is an 53 y.o. female.  Face to face consult with Dr. Lovena Le  Assessment: AXIS I:  Schizoaffective Disorder AXIS II:  Deferred AXIS III:   Past Medical History  Diagnosis Date  . Arthritis   . Seizures   . Psychiatric illness   . Bipolar 1 disorder   . Schizo-affective schizophrenia   . Obesity   . Substance abuse    AXIS IV:  housing problems, other psychosocial or environmental problems, problems related to social environment and problems with primary support group AXIS V:  51-60 moderate symptoms  Plan:  No evidence of imminent risk to self or others at present.   Patient does not meet criteria for psychiatric inpatient admission. Supportive therapy provided about ongoing stressors. Discussed crisis plan, support from social network, calling 911, coming to the Emergency Department, and calling Suicide Hotline.  Subjective:   [name] is a 53 y.o. female patient.  HPI:  Patient states "I came in cause my back was hurting; that's what I need to have looked at.  I hear the voices in my head but they are good voices not bad.  No they don't tell me to do nothing."  Patient states that she is compliant with her medications.  Patient states that she was just  Released for the hospital and is with the [facility]; has ACT team.  Patient denies suicidal ideation, homicidal ideation, and paranoia. TTS Ava spoke with the ACT team staff.  Informed that patient was in to see psychiatrist yesterday.  States that they will come to pick up [name] and any thing that is needed by her they will handle.   Past Psychiatric History: Past Medical History  Diagnosis Date  . Arthritis   . Seizures   . Psychiatric illness   . Bipolar 1 disorder   . Schizo-affective schizophrenia   . Obesity   . Substance abuse     reports that she has been smoking Cigarettes.   She has been smoking about 1.00 pack per day. She has never used smokeless tobacco. She reports that she drinks about 3.6 ounces of alcohol per week. She reports that she uses illicit drugs ("Crack" cocaine and Cocaine) about 5 times per week. Family History  Problem Relation Age of Onset  . Alzheimer's disease Mother   . Depression Mother            Allergies:  No Known Allergies  ACT Assessment Complete:  No:   Past Psychiatric History: Diagnosis:  Schizoaffective Disorder NOS  Hospitalizations:  Yes  Outpatient Care:  [facility]  Substance Abuse Care:  Denies  Self-Mutilation:  Denies  Suicidal Attempts:  Past history  Homicidal Behaviors:  Denies   Violent Behaviors:  Denies   Place of Residence:  [place] Marital Status:   Employed/Unemployed:  Unemployed (disability) Education:   Family Supports:   Objective: Blood pressure 125/40, pulse 90, temperature 97.3 F (36.3 C), temperature source Oral, resp. rate 18, last menstrual period 01/13/2012, SpO2 99.00%.There is no weight on file to calculate BMI. Results for orders placed during the hospital encounter of 03/21/13 (from the past 72 hour(s))  CBC WITH DIFFERENTIAL     Status: None   Collection Time    03/21/13  8:50 AM      Result Value Range   WBC 5.3  4.0 - 10.5 K/uL   RBC 4.33  3.87 - 5.11 MIL/uL   Hemoglobin 12.6  12.0 - 15.0 g/dL   HCT 37.7  36.0 - 46.0 %   MCV 87.1  78.0 - 100.0 fL   MCH 29.1  26.0 - 34.0 pg   MCHC 33.4  30.0 - 36.0 g/dL   RDW 14.2  11.5 - 15.5 %   Platelets 254  150 - 400 K/uL   Neutrophils Relative % 48  43 - 77 %   Neutro Abs 2.6  1.7 - 7.7 K/uL   Lymphocytes Relative 40  12 - 46 %   Lymphs Abs 2.1  0.7 - 4.0 K/uL   Monocytes Relative 11  3 - 12 %   Monocytes Absolute 0.6  0.1 - 1.0 K/uL   Eosinophils Relative 1  0 - 5 %   Eosinophils Absolute 0.0  0.0 - 0.7 K/uL   Basophils Relative 0  0 - 1 %   Basophils Absolute 0.0  0.0 - 0.1 K/uL  COMPREHENSIVE METABOLIC PANEL      Status: Abnormal   Collection Time    03/21/13  8:50 AM      Result Value Range   Sodium 139  137 - 147 mEq/L   Potassium 4.5  3.7 - 5.3 mEq/L   Chloride 101  96 - 112 mEq/L   CO2 27  19 - 32 mEq/L   Glucose, Bld 81  70 - 99 mg/dL   BUN 15  6 - 23 mg/dL   Creatinine, Ser 0.62  0.50 - 1.10 mg/dL   Calcium 9.7  8.4 - 10.5 mg/dL   Total Protein 7.3  6.0 - 8.3 g/dL   Albumin 3.5  3.5 - 5.2 g/dL   AST 14  0 - 37 U/L   ALT 21  0 - 35 U/L   Alkaline Phosphatase 53  39 - 117 U/L   Total Bilirubin <0.2 (*) 0.3 - 1.2 mg/dL   GFR calc non Af Amer >90  >90 mL/min   GFR calc Af Amer >90  >90 mL/min   Comment: (NOTE)     The eGFR has been calculated using the CKD EPI equation.     This calculation has not been validated in all clinical situations.     eGFR's persistently <90 mL/min signify possible Chronic Kidney     Disease.  ETHANOL     Status: None   Collection Time    03/21/13  8:50 AM      Result Value Range   Alcohol, Ethyl (B) <11  0 - 11 mg/dL   Comment:            LOWEST DETECTABLE LIMIT FOR     SERUM ALCOHOL IS 11 mg/dL     FOR MEDICAL PURPOSES ONLY  URINE RAPID DRUG SCREEN (HOSP PERFORMED)     Status: None   Collection Time    03/21/13  8:59 AM      Result Value Range   Opiates NONE DETECTED  NONE DETECTED   Cocaine NONE DETECTED  NONE DETECTED   Benzodiazepines NONE DETECTED  NONE DETECTED   Amphetamines NONE DETECTED  NONE DETECTED   Tetrahydrocannabinol NONE DETECTED  NONE DETECTED   Barbiturates NONE DETECTED  NONE DETECTED   Comment:            DRUG SCREEN FOR MEDICAL PURPOSES     ONLY.  IF CONFIRMATION IS NEEDED     FOR ANY PURPOSE, NOTIFY LAB     WITHIN 5 DAYS.  LOWEST DETECTABLE LIMITS     FOR URINE DRUG SCREEN     Drug Class       Cutoff (ng/mL)     Amphetamine      1000     Barbiturate      200     Benzodiazepine   865     Tricyclics       784     Opiates          300     Cocaine          300     THC              50     Current  Facility-Administered Medications  Medication Dose Route Frequency Provider Last Rate Last Dose  . acetaminophen (TYLENOL) tablet 650 mg  650 mg Oral Q4H PRN Tatyana A Kirichenko, PA-C      . benztropine (COGENTIN) tablet 0.5 mg  0.5 mg Oral QHS Shuvon Rankin, NP      . divalproex (DEPAKOTE ER) 24 hr tablet 500 mg  500 mg Oral Daily Tatyana A Kirichenko, PA-C   500 mg at 03/21/13 1030  . ibuprofen (ADVIL,MOTRIN) tablet 600 mg  600 mg Oral Q8H PRN Tatyana A Kirichenko, PA-C   600 mg at 03/21/13 1030  . nicotine (NICODERM CQ - dosed in mg/24 hours) patch 21 mg  21 mg Transdermal Daily Tatyana A Kirichenko, PA-C   21 mg at 03/21/13 1109  . ondansetron (ZOFRAN) tablet 4 mg  4 mg Oral Q8H PRN Tatyana A Kirichenko, PA-C      . QUEtiapine (SEROQUEL) tablet 50 mg  50 mg Oral QHS Tatyana A Kirichenko, PA-C      . risperiDONE (RISPERDAL) tablet 3 mg  3 mg Oral BID Tatyana A Kirichenko, PA-C   3 mg at 03/21/13 1029  . zolpidem (AMBIEN) tablet 5 mg  5 mg Oral QHS PRN Tatyana A Kirichenko, PA-C       Current Outpatient Prescriptions  Medication Sig Dispense Refill  . amLODipine (NORVASC) 5 MG tablet Take 1 tablet by mouth daily.      . benztropine (COGENTIN) 0.5 MG tablet Take one tablet, 0.$RemoveBefor'5mg'HuIAywEFbNYd$  daily at bedtime. For EPS.  30 tablet  0  . divalproex (DEPAKOTE ER) 500 MG 24 hr tablet Take one tablet ($RemoveBef'500mg'OljJhgZkpw$  ER) daily in the morning, and two tablets ($RemoveBefo'1000mg'eraIyPISkRe$ ) daily at bedtime.  90 tablet  0  . QUEtiapine (SEROQUEL) 50 MG tablet Take 1 tablet (50 mg total) by mouth at bedtime. For sleep and mood stability.  30 tablet  0  . risperiDONE (RISPERDAL) 3 MG tablet Take one tablet ($RemoveBef'3mg'ZDycrYGQTl$ ) twice daily in the morning and at bedtime.  For psychosis.  60 tablet  0  . risperiDONE microspheres (RISPERDAL CONSTA) 50 MG injection Inject 2 mLs (50 mg total) into the muscle every 14 (fourteen) days. For psychosis, last given on 07/01/2011.  1 each  0    Psychiatric Specialty Exam:     Blood pressure 125/40, pulse 90, temperature  97.3 F (36.3 C), temperature source Oral, resp. rate 18, last menstrual period 01/13/2012, SpO2 99.00%.There is no weight on file to calculate BMI.  General Appearance: Casual  Eye Contact::  Good  Speech:  Clear and Coherent and Normal Rate  Volume:  Normal  Mood:  I am good"  Affect:  Appropriate  Thought Process:  Circumstantial  Orientation:  Full (Time, Place, and Person)  Thought Content:  Rumination  Suicidal Thoughts:  No  Homicidal Thoughts:  No  Memory:  Immediate;   Fair Recent;   Fair  Judgement:  Fair  Insight:  Fair  Psychomotor Activity:  Normal  Concentration:  Fair  Recall:  Fair  Akathisia:  No  Handed:  Right  AIMS (if indicated):     Assets:  Communication Skills Desire for Improvement  Sleep:      Treatment Plan Summary: Outpatient provider  Discharge Assessment     Demographic Factors:  Low socioeconomic status and Living alone  Mental Status Per Nursing Assessment::   On Admission:     Current Mental Status by Physician: Denies suicidal/homicidal ideation and paranoia  Loss Factors: NA  Historical Factors: Schizoaffective Disorder  Risk Reduction Factors:   Positive therapeutic relationship, Positive coping skills or problem solving skills and ACT Team  Continued Clinical Symptoms:  Previous Psychiatric Diagnoses and Treatments  Cognitive Features That Contribute To Risk:  Loss of executive function    Suicide Risk:  Minimal: No identifiable suicidal ideation.  Patients presenting with no risk factors but with morbid ruminations; may be classified as minimal risk based on the severity of the depressive symptoms  Discharge Diagnoses:   AXIS I:  Schizoaffective Disorder AXIS II:  Deferred AXIS III:   Past Medical History  Diagnosis Date  . Arthritis   . Seizures   . Psychiatric illness   . Bipolar 1 disorder   . Schizo-affective schizophrenia   . Obesity   . Substance abuse    AXIS IV:  housing problems, occupational  problems and problems related to social environment AXIS V:  51-60 moderate symptoms  Plan Of Care/Follow-up recommendations:  Activity:  No restrictions Diet:  Regular  Is patient on multiple antipsychotic therapies at discharge:  No   Has Patient had three or more failed trials of antipsychotic monotherapy by history:  No  Recommended Plan for Multiple Antipsychotic Therapies: NA  Rankin, Shuvon, FNP-BC 03/21/2013, 12:08 PM

## 2013-03-22 ENCOUNTER — Emergency Department (HOSPITAL_COMMUNITY): Payer: Medicare Other

## 2013-03-22 ENCOUNTER — Encounter (HOSPITAL_COMMUNITY): Payer: Self-pay | Admitting: Emergency Medicine

## 2013-03-22 ENCOUNTER — Emergency Department (HOSPITAL_COMMUNITY)
Admission: EM | Admit: 2013-03-22 | Discharge: 2013-03-22 | Disposition: A | Discharge status: Home / Self Care | Payer: Medicare Other | Attending: Emergency Medicine | Admitting: Emergency Medicine

## 2013-03-22 DIAGNOSIS — Z79899 Other long term (current) drug therapy: Secondary | ICD-10-CM | POA: Insufficient documentation

## 2013-03-22 DIAGNOSIS — F17200 Nicotine dependence, unspecified, uncomplicated: Secondary | ICD-10-CM | POA: Insufficient documentation

## 2013-03-22 DIAGNOSIS — F1910 Other psychoactive substance abuse, uncomplicated: Secondary | ICD-10-CM

## 2013-03-22 DIAGNOSIS — E669 Obesity, unspecified: Secondary | ICD-10-CM | POA: Insufficient documentation

## 2013-03-22 DIAGNOSIS — I10 Essential (primary) hypertension: Secondary | ICD-10-CM

## 2013-03-22 DIAGNOSIS — R1084 Generalized abdominal pain: Secondary | ICD-10-CM | POA: Insufficient documentation

## 2013-03-22 DIAGNOSIS — F1410 Cocaine abuse, uncomplicated: Secondary | ICD-10-CM | POA: Insufficient documentation

## 2013-03-22 DIAGNOSIS — Z91199 Patient's noncompliance with other medical treatment and regimen due to unspecified reason: Secondary | ICD-10-CM | POA: Insufficient documentation

## 2013-03-22 DIAGNOSIS — Z9119 Patient's noncompliance with other medical treatment and regimen: Secondary | ICD-10-CM

## 2013-03-22 DIAGNOSIS — F319 Bipolar disorder, unspecified: Secondary | ICD-10-CM | POA: Insufficient documentation

## 2013-03-22 DIAGNOSIS — G8929 Other chronic pain: Secondary | ICD-10-CM | POA: Insufficient documentation

## 2013-03-22 DIAGNOSIS — R079 Chest pain, unspecified: Secondary | ICD-10-CM | POA: Insufficient documentation

## 2013-03-22 DIAGNOSIS — R197 Diarrhea, unspecified: Secondary | ICD-10-CM | POA: Insufficient documentation

## 2013-03-22 DIAGNOSIS — Z9889 Other specified postprocedural states: Secondary | ICD-10-CM | POA: Insufficient documentation

## 2013-03-22 DIAGNOSIS — F203 Undifferentiated schizophrenia: Secondary | ICD-10-CM

## 2013-03-22 DIAGNOSIS — R000 Tachycardia, unspecified: Secondary | ICD-10-CM | POA: Insufficient documentation

## 2013-03-22 DIAGNOSIS — R112 Nausea with vomiting, unspecified: Secondary | ICD-10-CM | POA: Insufficient documentation

## 2013-03-22 DIAGNOSIS — Z8739 Personal history of other diseases of the musculoskeletal system and connective tissue: Secondary | ICD-10-CM | POA: Insufficient documentation

## 2013-03-22 DIAGNOSIS — R443 Hallucinations, unspecified: Secondary | ICD-10-CM | POA: Insufficient documentation

## 2013-03-22 DIAGNOSIS — F259 Schizoaffective disorder, unspecified: Secondary | ICD-10-CM | POA: Insufficient documentation

## 2013-03-22 DIAGNOSIS — G40909 Epilepsy, unspecified, not intractable, without status epilepticus: Secondary | ICD-10-CM | POA: Insufficient documentation

## 2013-03-22 LAB — RAPID URINE DRUG SCREEN, HOSP PERFORMED
Amphetamines: NOT DETECTED
BENZODIAZEPINES: NOT DETECTED
Barbiturates: NOT DETECTED
Cocaine: NOT DETECTED
OPIATES: NOT DETECTED
TETRAHYDROCANNABINOL: NOT DETECTED

## 2013-03-22 LAB — CBC
HCT: 35.2 % — ABNORMAL LOW (ref 36.0–46.0)
Hemoglobin: 12 g/dL (ref 12.0–15.0)
MCH: 29.5 pg (ref 26.0–34.0)
MCHC: 34.1 g/dL (ref 30.0–36.0)
MCV: 86.5 fL (ref 78.0–100.0)
PLATELETS: 262 10*3/uL (ref 150–400)
RBC: 4.07 MIL/uL (ref 3.87–5.11)
RDW: 14.5 % (ref 11.5–15.5)
WBC: 5.7 10*3/uL (ref 4.0–10.5)

## 2013-03-22 LAB — URINALYSIS, ROUTINE W REFLEX MICROSCOPIC
GLUCOSE, UA: NEGATIVE mg/dL
HGB URINE DIPSTICK: NEGATIVE
Ketones, ur: NEGATIVE mg/dL
Nitrite: NEGATIVE
Protein, ur: NEGATIVE mg/dL
SPECIFIC GRAVITY, URINE: 1.036 — AB (ref 1.005–1.030)
UROBILINOGEN UA: 1 mg/dL (ref 0.0–1.0)
pH: 5.5 (ref 5.0–8.0)

## 2013-03-22 LAB — BASIC METABOLIC PANEL
BUN: 15 mg/dL (ref 6–23)
CALCIUM: 8.9 mg/dL (ref 8.4–10.5)
CO2: 28 mEq/L (ref 19–32)
Chloride: 102 mEq/L (ref 96–112)
Creatinine, Ser: 0.67 mg/dL (ref 0.50–1.10)
GFR calc Af Amer: >90 mL/min (ref >90)
Glucose, Bld: 93 mg/dL (ref 70–99)
POTASSIUM: 4.3 meq/L (ref 3.7–5.3)
Sodium: 141 mEq/L (ref 137–147)

## 2013-03-22 LAB — TROPONIN I

## 2013-03-22 LAB — PREGNANCY, URINE: Preg Test, Ur: NEGATIVE

## 2013-03-22 LAB — URINE MICROSCOPIC-ADD ON

## 2013-03-22 LAB — VALPROIC ACID LEVEL: Valproic Acid Lvl: 23 ug/mL — ABNORMAL LOW (ref 50.0–100.0)

## 2013-03-22 MED ORDER — ALBUTEROL SULFATE (2.5 MG/3ML) 0.083% IN NEBU: 5.0000 mg | INHALATION_SOLUTION | Freq: Once | RESPIRATORY_TRACT | Status: DC

## 2013-03-22 MED ORDER — OXYCODONE-ACETAMINOPHEN 5-325 MG PO TABS
2.0000 | ORAL_TABLET | Freq: Once | ORAL | Status: AC
  Administered 2013-03-22: 2 via ORAL
  Filled 2013-03-22: qty 2

## 2013-03-22 MED ORDER — IPRATROPIUM BROMIDE 0.02 % IN SOLN: 0.5000 mg | Freq: Once | RESPIRATORY_TRACT | Status: DC

## 2013-03-22 MED ORDER — DIVALPROEX SODIUM 500 MG PO DR TAB
500.0000 mg | DELAYED_RELEASE_TABLET | Freq: Two times a day (BID) | ORAL | Status: DC
  Administered 2013-03-22: 500 mg via ORAL
  Filled 2013-03-22: qty 1

## 2013-03-22 NOTE — ED Notes (Signed)
Patient transported to X-ray 

## 2013-03-22 NOTE — ED Provider Notes (Signed)
MSE was initiated and I personally evaluated the patient and placed orders (if any) at  4:17 PM on March 22, 2013.  Mary LefortDebra Fergusson is a 53 year old female with past medical history of arthritis, seizures, bipolar disorder, schizophrenia, substance abuse, homeless presents to emergency department with low back pain-reports she's been having low back pain for many years and ran out of pain medications. Reports that she's been experiencing left-sided chest pain described as a sharp shooting pain localized left-side of chest with radiation down the left arm that started when she came into the emergency department today. Patient was brought in by EMS. Reports that she's extremely tired. Stated that she's been having lower abdominal pain for the past couple of days described as a burning sensation. This provider reviewed patient's chart. This would be patient's third visit regarding abdominal pain. Patient was seen on January 7 and January 8 regarding same complaint. Labs performed with negative findings. Patient presents with new onset of chest pain that started today. Patient does not Fast Track Criteria. Labs ordered. Patient has been moved to main ED for further workup and evaluation.  The patient appears stable so that the remainder of the MSE may be completed by another provider.  Raymon MuttonMarissa Iness Pangilinan, PA-C 03/23/13 1006

## 2013-03-22 NOTE — Discharge Instructions (Signed)
1. Medications: usual home medications 2. Treatment: rest, drink plenty of fluids,  3. Follow Up: Please followup with your primary doctor for discussion of your diagnoses and further evaluation after today's visit; if you do not have a primary care doctor use the resource guide provided to find one;   Back Exercises Back exercises help treat and prevent back injuries. The goal of back exercises is to increase the strength of your abdominal and back muscles and the flexibility of your back. These exercises should be started when you no longer have back pain. Back exercises include:  Pelvic Tilt. Lie on your back with your knees bent. Tilt your pelvis until the lower part of your back is against the floor. Hold this position 5 to 10 sec and repeat 5 to 10 times.  Knee to Chest. Pull first 1 knee up against your chest and hold for 20 to 30 seconds, repeat this with the other knee, and then both knees. This may be done with the other leg straight or bent, whichever feels better.  Sit-Ups or Curl-Ups. Bend your knees 90 degrees. Start with tilting your pelvis, and do a partial, slow sit-up, lifting your trunk only 30 to 45 degrees off the floor. Take at least 2 to 3 seconds for each sit-up. Do not do sit-ups with your knees out straight. If partial sit-ups are difficult, simply do the above but with only tightening your abdominal muscles and holding it as directed.  Hip-Lift. Lie on your back with your knees flexed 90 degrees. Push down with your feet and shoulders as you raise your hips a couple inches off the floor; hold for 10 seconds, repeat 5 to 10 times.  Back arches. Lie on your stomach, propping yourself up on bent elbows. Slowly press on your hands, causing an arch in your low back. Repeat 3 to 5 times. Any initial stiffness and discomfort should lessen with repetition over time.  Shoulder-Lifts. Lie face down with arms beside your body. Keep hips and torso pressed to floor as you slowly lift  your head and shoulders off the floor. Do not overdo your exercises, especially in the beginning. Exercises may cause you some mild back discomfort which lasts for a few minutes; however, if the pain is more severe, or lasts for more than 15 minutes, do not continue exercises until you see your caregiver. Improvement with exercise therapy for back problems is slow.  See your caregivers for assistance with developing a proper back exercise program. Document Released: 04/07/2004 Document Revised: 05/23/2011 Document Reviewed: 12/30/2010 [facility] Patient Information 2014 [employer] [place].

## 2013-03-22 NOTE — ED Provider Notes (Signed)
Medical screening examination/treatment/procedure(s) were performed by non-physician practitioner and as supervising physician I was immediately available for consultation/collaboration.  EKG Interpretation   None         Shon Batonourtney F Konor Noren, MD 03/22/13 [phone]

## 2013-03-22 NOTE — ED Provider Notes (Signed)
Medical screening examination/treatment/procedure(s) were performed by non-physician practitioner and as supervising physician I was immediately available for consultation/collaboration.  EKG Interpretation   None        Juliet RudeNathan R. Rubin PayorPickering, MD 03/22/13 [phone]

## 2013-03-22 NOTE — ED Notes (Signed)
Ambulates with no difficulty

## 2013-03-22 NOTE — ED Notes (Signed)
Per EMS pt from homeless shelter and reports chronic back pain and shelter will not let her lay in bed without a doctors note. Pt reports she is out of pain meds.

## 2013-03-22 NOTE — ED Provider Notes (Signed)
CSN: [id number]     Arrival date & time 03/22/13  1522 History   First MD Initiated Contact with Patient 03/22/13 1535     Chief Complaint  Patient presents with  . Back Pain   (Consider location/radiation/quality/duration/timing/severity/associated sxs/prior Treatment) HPI  [name] is a 53 y.o. female  with a hx of arthritis, bipolar 1 disorder, schizoaffective schizophrenia, substance abuse presents to the Emergency Departmentwith multiple complaints.  Pt reports low back pain which is chronic.  She denies falls, trauma or known injury.  She reports she is out of her pain medication and needs a refill. She reports no difficulty ambulating and denies bowel or bladder incontinence.    Pt also c/o "needing to see a psychiatrist."  She reports she is seeing "murderers and vampires."  She also reports that she is "hearing good voices, not bad voices." She reports she is not taking her psychiatric medications.   Patient states she quit drinking alcohol and doing drugs a month ago. She denies any suicidal ideations or homicidal ideations. She does not recall the medications that she was on.   Pt also reports generalized abd pain with associated nausea, vomiting and dysuria. Patient denies sexual activity in over a year, vaginal discharge, vaginal pain or pelvic pain. She also denies fever, chills, headache neck pain, shortness of breath, weakness, dizziness, syncope.  Nothing seems to make her abdominal pain better or worse and she has not attempted any treatment for it. Patient states she is homeless staying at a shelter.    Past Medical History  Diagnosis Date  . Arthritis   . Seizures   . Psychiatric illness   . Bipolar 1 disorder   . Schizo-affective schizophrenia   . Obesity   . Substance abuse    Past Surgical History  Procedure Laterality Date  . Stomach surgery     Family History  Problem Relation Age of Onset  . Alzheimer's disease Mother   . Depression Mother    History   Substance Use Topics  . Smoking status: Current Every Day Smoker -- 1.00 packs/day    Types: Cigarettes  . Smokeless tobacco: Never Used  . Alcohol Use: 3.6 oz/week    6 Cans of beer per week   OB History   Grav Para Term Preterm Abortions TAB SAB Ect Mult Living                 Review of Systems  Constitutional: Negative for fever, diaphoresis, appetite change, fatigue and unexpected weight change.  HENT: Negative for mouth sores and trouble swallowing.   Respiratory: Negative for cough, chest tightness, shortness of breath, wheezing and stridor.   Cardiovascular: Positive for chest pain. Negative for palpitations.  Gastrointestinal: Positive for nausea, vomiting and abdominal pain. Negative for diarrhea, constipation, blood in stool, abdominal distention and rectal pain.  Genitourinary: Negative for dysuria, urgency, frequency, hematuria, flank pain and difficulty urinating.  Musculoskeletal: Positive for back pain. Negative for neck pain and neck stiffness.  Skin: Negative for rash.  Neurological: Negative for weakness.  Hematological: Negative for adenopathy.  Psychiatric/Behavioral: Positive for hallucinations. Negative for confusion.  All other systems reviewed and are negative.    Allergies  Review of patient's allergies indicates no known allergies.  Home Medications   Current Outpatient Rx  Name  Route  Sig  Dispense  Refill  . amLODipine (NORVASC) 5 MG tablet   Oral   Take 1 tablet by mouth daily.         Marland Kitchen  benztropine (COGENTIN) 0.5 MG tablet      Take one tablet, 0.5mg  daily at bedtime. For EPS.   30 tablet   0   . divalproex (DEPAKOTE ER) 500 MG 24 hr tablet   Oral   Take 500 mg by mouth 2 (two) times daily.         . QUEtiapine (SEROQUEL) 50 MG tablet   Oral   Take 1 tablet (50 mg total) by mouth at bedtime. For sleep and mood stability.   30 tablet   0   . risperiDONE (RISPERDAL) 1 MG tablet   Oral   Take 1 mg by mouth at bedtime.          . risperiDONE microspheres (RISPERDAL CONSTA) 50 MG injection   Intramuscular   Inject 2 mLs (50 mg total) into the muscle every 14 (fourteen) days. For psychosis, last given on 07/01/2011.   1 each   0    BP 123/70  Pulse 107  Temp(Src) 99.1 F (37.3 C) (Oral)  Resp 21  SpO2 99%  LMP 01/13/2012 Physical Exam  Nursing note and vitals reviewed. Constitutional: She appears well-developed and well-nourished. No distress.  Awake, alert, nontoxic appearance, disheveled  HENT:  Head: Normocephalic and atraumatic.  Mouth/Throat: Oropharynx is clear and moist. No oropharyngeal exudate.  Eyes: Conjunctivae are normal. Pupils are equal, round, and reactive to light. No scleral icterus.  Neck: Normal range of motion. Neck supple.  Full ROM without pain No nuchal rigidity  Cardiovascular: Regular rhythm, S1 normal, S2 normal, normal heart sounds and intact distal pulses.  Tachycardia present.   No murmur heard. Pulses:      Radial pulses are 2+ on the right side, and 2+ on the left side.       Dorsalis pedis pulses are 2+ on the right side, and 2+ on the left side.       Posterior tibial pulses are 2+ on the right side, and 2+ on the left side.  Tachycardia  Pulmonary/Chest: Effort normal and breath sounds normal. No respiratory distress. She has no wheezes.  Clear and equal breath sounds  Abdominal: Soft. Bowel sounds are normal. She exhibits no distension and no mass. There is no tenderness. There is no rebound, no guarding and no CVA tenderness.  Abdomen soft and nontender No CVA tenderness  Musculoskeletal: Normal range of motion. She exhibits no edema.  Full range of motion of the T-spine and L-spine No tenderness to palpation of the spinous processes of the T-spine or L-spine Mild tenderness to palpation of the paraspinous muscles of the L-spine  Lymphadenopathy:    She has no cervical adenopathy.  Neurological: She is alert. She has normal reflexes.  Speech is clear,  follows commands Normal strength in upper and lower extremities bilaterally including dorsiflexion and plantar flexion, strong and equal grip strength Sensation normal to light and sharp touch Moves extremities without ataxia, coordination intact Normal gait Normal balance   Skin: Skin is warm and dry. No rash noted. She is not diaphoretic. No erythema.  Psychiatric: She has a normal mood and affect. Her speech is rapid and/or pressured. She is actively hallucinating. She expresses no homicidal and no suicidal ideation. She expresses no suicidal plans and no homicidal plans.  Pleasant mood Odd behavior with tangential thought process and associated pressured speech  Denies SI/HI     ED Course  Procedures (including critical care time) Labs Review Labs Reviewed  URINALYSIS, ROUTINE W REFLEX MICROSCOPIC - Abnormal; Notable for the  following:    Color, Urine AMBER (*)    Specific Gravity, Urine 1.036 (*)    Bilirubin Urine SMALL (*)    Leukocytes, UA SMALL (*)    All other components within normal limits  CBC - Abnormal; Notable for the following:    HCT 35.2 (*)    All other components within normal limits  VALPROIC ACID LEVEL - Abnormal; Notable for the following:    Valproic Acid Lvl 23.0 (*)    All other components within normal limits  URINE MICROSCOPIC-ADD ON - Abnormal; Notable for the following:    Bacteria, UA FEW (*)    All other components within normal limits  URINE RAPID DRUG SCREEN (HOSP PERFORMED)  BASIC METABOLIC PANEL  PREGNANCY, URINE  TROPONIN I   Imaging Review Dg Lumbar Spine Complete  03/22/2013   CLINICAL DATA:  Chronic back pain  EXAM: LUMBAR SPINE - COMPLETE 4+ VIEW  COMPARISON:  None.  FINDINGS: There are 5 nonrib bearing lumbar-type vertebral bodies. The vertebral body heights are maintained. The alignment is anatomic. There is no spondylolysis. There is no acute fracture or static listhesis. The disc spaces are maintained. There is moderate facet  arthropathy at L4-5 and L5-S1.  The SI joints are unremarkable.  IMPRESSION: Negative.   Electronically Signed   By: Elige KoHetal  Patel   On: 03/22/2013 16:48    EKG Interpretation    Date/Time:  Friday March 22 2013 16:56:58 EST Ventricular Rate:  87 PR Interval:  119 QRS Duration: 64 QT Interval:  328 QTC Calculation: 394 R Axis:   68 Text Interpretation:  Sinus rhythm Nonspecific T wave abnormality Confirmed by STEINL  MD, KEVIN ([id number]) on 03/22/2013 5:19:35 PM            MDM   1. Schizophrenia, undifferentiated   2. Non-compliant patient   3. Hypertension   4. Polysubstance abuse     Andre LefortDebra [name] presents with multiple complaints. She's been evaluated for these for the last 3 days here in the emergency department.  Record review yesterday [facility] by [facility] own [facility] team.    6:47 PM Discussed with Joann Scales the on call RN with [facility] who reports that patient was seen by their psychiatrist and cleared back to the shelter.  Hx of substance abuse.  Pt just returned this week from Rehab after detox and half way house.  She reports a hx of making up complaints in an effort to avoid the shelter but she reports there is no location to place her due to financial instability.  She was homeless prior to rehab.  From their standpoint based on the evaluation yesterday she is psychiatrically clear to return to the shelter.  [facility] week to check in on her.  They will plan on seeing her at the shelter next week.    Patient is nontoxic, nonseptic appearing, in no apparent distress. Labs and vitals reviewed.  Patient's abdomen remained soft and nontender with serial abdominal exams. On repeat exam patient no longer tachycardic with heart rate at 84. Patient does not meet the SIRS or Sepsis criteria.  On repeat exam patient does not have a surgical abdomin and there are no peritoneal signs.  No indication of appendicitis, bowel  obstruction, bowel perforation, cholecystitis, diverticulitis.  Pregnancy test negative.    Patient with chronic back pain.  X-ray negative for acute fracture or subluxation.  I personally reviewed the imaging tests through  PACS system. I reviewed available ER/hospitalization records through the EMR.  No neurological deficits and normal neuro exam.  Patient relates with no difficulty..  No loss of bowel or bladder control.  No concern for cauda equina.  No fever, night sweats, weight loss, h/o cancer, ans denies current IVDU.    It has been determined that no acute conditions requiring further emergency intervention are present at this time. The patient/guardian have been advised of the diagnosis and plan. We have discussed signs and symptoms that warrant return to the ED, such as changes or worsening in symptoms.     Dierdre Forth, PA-C 03/22/13 1910

## 2013-03-23 NOTE — ED Provider Notes (Signed)
Medical screening examination/treatment/procedure(s) were conducted as a shared visit with non-physician practitioner(s) and myself.  I personally evaluated the patient during the encounter.  EKG Interpretation    Date/Time:  Friday March 22 2013 16:56:58 EST Ventricular Rate:  87 PR Interval:  119 QRS Duration: 64 QT Interval:  328 QTC Calculation: 394 R Axis:   68 Text Interpretation:  Sinus rhythm Nonspecific T wave abnormality Confirmed by Jakelyn Squyres  MD, Amarra Sawyer ([id number]) on 03/22/2013 5:19:35 PM            Pt c/o low back pain, notes hx same on chronic basis. No injury or fall. No numbness/weakness. No gi or gu c/o. No fevers. Spine nt.  Suzi RootsKevin E Chaslyn Eisen, MD 03/23/13 [phone]

## 2013-03-23 NOTE — ED Provider Notes (Signed)
Medical screening examination/treatment/procedure(s) were performed by non-physician practitioner and as supervising physician I was immediately available for consultation/collaboration.  EKG Interpretation    Date/Time:  Friday March 22 2013 16:56:58 EST Ventricular Rate:  87 PR Interval:  119 QRS Duration: 64 QT Interval:  328 QTC Calculation: 394 R Axis:   68 Text Interpretation:  Sinus rhythm Nonspecific T wave abnormality Confirmed by Denton LankSTEINL  MD, KEVIN ([id number]) on 03/22/2013 5:19:35 PM             [name], MD 03/23/13 1235

## 2013-03-28 ENCOUNTER — Encounter (HOSPITAL_COMMUNITY): Payer: Self-pay | Admitting: Emergency Medicine

## 2013-03-28 ENCOUNTER — Emergency Department (HOSPITAL_COMMUNITY)
Admission: EM | Admit: 2013-03-28 | Discharge: 2013-03-28 | Discharge status: Against Medical Advice | Payer: Medicare Other | Attending: Emergency Medicine | Admitting: Emergency Medicine

## 2013-03-28 DIAGNOSIS — R509 Fever, unspecified: Secondary | ICD-10-CM | POA: Insufficient documentation

## 2013-03-28 DIAGNOSIS — E669 Obesity, unspecified: Secondary | ICD-10-CM | POA: Insufficient documentation

## 2013-03-28 DIAGNOSIS — F17200 Nicotine dependence, unspecified, uncomplicated: Secondary | ICD-10-CM | POA: Insufficient documentation

## 2013-03-28 MED ORDER — ACETAMINOPHEN 325 MG PO TABS
650.0000 mg | ORAL_TABLET | Freq: Once | ORAL | Status: AC
  Administered 2013-03-28: 650 mg via ORAL
  Filled 2013-03-28: qty 2

## 2013-03-28 NOTE — ED Notes (Signed)
Pt brought in by ems; pt c/o leg hip back pain; told EMT she was here because she was cold

## 2013-03-28 NOTE — ED Notes (Signed)
Pt states living in a shelter where someone has the flu; states has been sick for a while

## 2013-03-28 NOTE — ED Notes (Signed)
Pt just walked out stating "I'm ready to go.  I'm don't want to see a doctor."  This RN notified Pt of the risks of leaving.  Pt continued to leave.

## 2013-03-28 NOTE — ED Provider Notes (Signed)
Patient left without being seen-patient left after triage. This provider did not see the patient. Patient left before this provider could interview or assess the patient. This provider did not partake in any medical decision-making for this patient.  Raymon MuttonMarissa Jozeph Persing, PA-C 03/29/13 1939

## 2013-03-29 ENCOUNTER — Inpatient Hospital Stay (HOSPITAL_COMMUNITY)
Admission: EM | Admit: 2013-03-29 | Discharge: 2013-04-02 | DRG: 195 | Disposition: A | Discharge status: Home / Self Care | Payer: Medicare Other | Attending: Internal Medicine | Admitting: Internal Medicine

## 2013-03-29 ENCOUNTER — Emergency Department (HOSPITAL_COMMUNITY): Payer: Medicare Other

## 2013-03-29 ENCOUNTER — Encounter (HOSPITAL_COMMUNITY): Payer: Self-pay | Admitting: Emergency Medicine

## 2013-03-29 DIAGNOSIS — E669 Obesity, unspecified: Secondary | ICD-10-CM | POA: Diagnosis present

## 2013-03-29 DIAGNOSIS — F1020 Alcohol dependence, uncomplicated: Secondary | ICD-10-CM

## 2013-03-29 DIAGNOSIS — F17200 Nicotine dependence, unspecified, uncomplicated: Secondary | ICD-10-CM | POA: Diagnosis present

## 2013-03-29 DIAGNOSIS — F259 Schizoaffective disorder, unspecified: Secondary | ICD-10-CM | POA: Diagnosis present

## 2013-03-29 DIAGNOSIS — R5381 Other malaise: Secondary | ICD-10-CM | POA: Diagnosis present

## 2013-03-29 DIAGNOSIS — Z5900 Homelessness unspecified: Secondary | ICD-10-CM

## 2013-03-29 DIAGNOSIS — G8929 Other chronic pain: Secondary | ICD-10-CM | POA: Diagnosis present

## 2013-03-29 DIAGNOSIS — J189 Pneumonia, unspecified organism: Principal | ICD-10-CM | POA: Diagnosis present

## 2013-03-29 DIAGNOSIS — F250 Schizoaffective disorder, bipolar type: Secondary | ICD-10-CM

## 2013-03-29 DIAGNOSIS — M549 Dorsalgia, unspecified: Secondary | ICD-10-CM | POA: Diagnosis present

## 2013-03-29 DIAGNOSIS — F1910 Other psychoactive substance abuse, uncomplicated: Secondary | ICD-10-CM | POA: Diagnosis present

## 2013-03-29 DIAGNOSIS — R52 Pain, unspecified: Secondary | ICD-10-CM

## 2013-03-29 DIAGNOSIS — I10 Essential (primary) hypertension: Secondary | ICD-10-CM | POA: Diagnosis present

## 2013-03-29 DIAGNOSIS — R5383 Other fatigue: Secondary | ICD-10-CM

## 2013-03-29 DIAGNOSIS — Z820 Family history of epilepsy and other diseases of the nervous system: Secondary | ICD-10-CM

## 2013-03-29 LAB — COMPREHENSIVE METABOLIC PANEL
ALT: 25 U/L (ref 0–35)
AST: 14 U/L (ref 0–37)
Albumin: 3.3 g/dL — ABNORMAL LOW (ref 3.5–5.2)
Alkaline Phosphatase: 51 U/L (ref 39–117)
BUN: 10 mg/dL (ref 6–23)
CHLORIDE: 101 meq/L (ref 96–112)
CO2: 26 mEq/L (ref 19–32)
Calcium: 8.9 mg/dL (ref 8.4–10.5)
Creatinine, Ser: 0.72 mg/dL (ref 0.50–1.10)
GFR calc Af Amer: >90 mL/min (ref >90)
Glucose, Bld: 87 mg/dL (ref 70–99)
Potassium: 4.1 mEq/L (ref 3.7–5.3)
Sodium: 141 mEq/L (ref 137–147)
Total Protein: 7.4 g/dL (ref 6.0–8.3)

## 2013-03-29 LAB — LIPASE, BLOOD: Lipase: 15 U/L (ref 11–59)

## 2013-03-29 LAB — PREGNANCY, URINE: Preg Test, Ur: NEGATIVE

## 2013-03-29 LAB — RAPID URINE DRUG SCREEN, HOSP PERFORMED
Amphetamines: NOT DETECTED
BENZODIAZEPINES: NOT DETECTED
Barbiturates: NOT DETECTED
Cocaine: NOT DETECTED
Opiates: NOT DETECTED
Tetrahydrocannabinol: NOT DETECTED

## 2013-03-29 LAB — URINALYSIS, ROUTINE W REFLEX MICROSCOPIC
Bilirubin Urine: NEGATIVE
GLUCOSE, UA: NEGATIVE mg/dL
HGB URINE DIPSTICK: NEGATIVE
Ketones, ur: NEGATIVE mg/dL
Leukocytes, UA: NEGATIVE
Nitrite: NEGATIVE
PH: 7 (ref 5.0–8.0)
Protein, ur: NEGATIVE mg/dL
Specific Gravity, Urine: 1.02 (ref 1.005–1.030)
Urobilinogen, UA: 1 mg/dL (ref 0.0–1.0)

## 2013-03-29 LAB — ETHANOL

## 2013-03-29 LAB — CBC WITH DIFFERENTIAL/PLATELET
BASOS ABS: 0 10*3/uL (ref 0.0–0.1)
Basophils Relative: 0 % (ref 0–1)
Eosinophils Absolute: 0 10*3/uL (ref 0.0–0.7)
Eosinophils Relative: 0 % (ref 0–5)
HEMATOCRIT: 34.8 % — AB (ref 36.0–46.0)
HEMOGLOBIN: 11.4 g/dL — AB (ref 12.0–15.0)
LYMPHS ABS: 1 10*3/uL (ref 0.7–4.0)
LYMPHS PCT: 16 % (ref 12–46)
MCH: 28.4 pg (ref 26.0–34.0)
MCHC: 32.8 g/dL (ref 30.0–36.0)
MCV: 86.6 fL (ref 78.0–100.0)
MONO ABS: 1 10*3/uL (ref 0.1–1.0)
MONOS PCT: 16 % — AB (ref 3–12)
NEUTROS ABS: 4.1 10*3/uL (ref 1.7–7.7)
Neutrophils Relative %: 68 % (ref 43–77)
Platelets: 226 10*3/uL (ref 150–400)
RBC: 4.02 MIL/uL (ref 3.87–5.11)
RDW: 15 % (ref 11.5–15.5)
WBC: 6 10*3/uL (ref 4.0–10.5)

## 2013-03-29 LAB — INFLUENZA PANEL BY PCR (TYPE A & B)
H1N1 flu by pcr: NOT DETECTED
INFLBPCR: NEGATIVE
Influenza A By PCR: NEGATIVE

## 2013-03-29 LAB — SALICYLATE LEVEL: Salicylate Lvl: <2 mg/dL — ABNORMAL LOW (ref 2.8–20.0)

## 2013-03-29 LAB — HIV ANTIBODY (ROUTINE TESTING W REFLEX): HIV: NONREACTIVE

## 2013-03-29 LAB — LACTIC ACID, PLASMA: Lactic Acid, Venous: 1.4 mmol/L (ref 0.5–2.2)

## 2013-03-29 LAB — ACETAMINOPHEN LEVEL: Acetaminophen (Tylenol), Serum: <15 ug/mL (ref 10–30)

## 2013-03-29 MED ORDER — AZITHROMYCIN 250 MG PO TABS
500.0000 mg | ORAL_TABLET | Freq: Once | ORAL | Status: AC
  Administered 2013-03-29: 500 mg via ORAL
  Filled 2013-03-29: qty 2

## 2013-03-29 MED ORDER — ENOXAPARIN SODIUM 40 MG/0.4ML ~~LOC~~ SOLN
40.0000 mg | SUBCUTANEOUS | Status: DC
  Administered 2013-03-29 – 2013-03-31 (×3): 40 mg via SUBCUTANEOUS
  Filled 2013-03-29 (×5): qty 0.4

## 2013-03-29 MED ORDER — BENZTROPINE MESYLATE 0.5 MG PO TABS
0.5000 mg | ORAL_TABLET | Freq: Every day | ORAL | Status: DC
  Administered 2013-03-29 – 2013-04-01 (×4): 0.5 mg via ORAL
  Filled 2013-03-29 (×5): qty 1

## 2013-03-29 MED ORDER — ACETAMINOPHEN 325 MG PO TABS
650.0000 mg | ORAL_TABLET | Freq: Once | ORAL | Status: AC
  Administered 2013-03-29: 650 mg via ORAL
  Filled 2013-03-29: qty 2

## 2013-03-29 MED ORDER — SODIUM CHLORIDE 0.9 % IV BOLUS (SEPSIS): 1000.0000 mL | Freq: Once | INTRAVENOUS | Status: DC

## 2013-03-29 MED ORDER — DIVALPROEX SODIUM ER 500 MG PO TB24
500.0000 mg | ORAL_TABLET | Freq: Two times a day (BID) | ORAL | Status: DC
  Administered 2013-03-29: 500 mg via ORAL
  Filled 2013-03-29 (×3): qty 1

## 2013-03-29 MED ORDER — QUETIAPINE FUMARATE 50 MG PO TABS
50.0000 mg | ORAL_TABLET | Freq: Every day | ORAL | Status: DC
  Administered 2013-03-29 – 2013-04-01 (×4): 50 mg via ORAL
  Filled 2013-03-29 (×5): qty 1

## 2013-03-29 MED ORDER — RISPERIDONE 1 MG PO TABS
1.0000 mg | ORAL_TABLET | Freq: Every day | ORAL | Status: DC
  Administered 2013-03-29: 1 mg via ORAL
  Filled 2013-03-29 (×2): qty 1

## 2013-03-29 MED ORDER — AMLODIPINE BESYLATE 5 MG PO TABS
5.0000 mg | ORAL_TABLET | Freq: Every day | ORAL | Status: DC
  Administered 2013-03-29 – 2013-04-02 (×5): 5 mg via ORAL
  Filled 2013-03-29 (×5): qty 1

## 2013-03-29 MED ORDER — SODIUM CHLORIDE 0.9 % IV SOLN: INTRAVENOUS | Status: DC

## 2013-03-29 MED ORDER — DEXTROSE 5 % IV SOLN
1.0000 g | INTRAVENOUS | Status: DC
  Administered 2013-03-29 – 2013-03-31 (×3): 1 g via INTRAVENOUS
  Filled 2013-03-29 (×4): qty 10

## 2013-03-29 MED ORDER — DEXTROSE 5 % IV SOLN
500.0000 mg | INTRAVENOUS | Status: DC
  Administered 2013-03-30 – 2013-03-31 (×2): 500 mg via INTRAVENOUS
  Filled 2013-03-29 (×3): qty 500

## 2013-03-29 MED ORDER — ONDANSETRON HCL 4 MG/2ML IJ SOLN
4.0000 mg | Freq: Once | INTRAMUSCULAR | Status: DC
  Filled 2013-03-29: qty 2

## 2013-03-29 MED ORDER — ONDANSETRON 4 MG PO TBDP
4.0000 mg | ORAL_TABLET | Freq: Once | ORAL | Status: AC
  Administered 2013-03-29: 4 mg via ORAL
  Filled 2013-03-29: qty 1

## 2013-03-29 MED ORDER — SODIUM CHLORIDE 0.9 % IV SOLN
INTRAVENOUS | Status: DC
  Administered 2013-03-29 – 2013-03-31 (×4): via INTRAVENOUS

## 2013-03-29 MED ORDER — ONDANSETRON HCL 4 MG/2ML IJ SOLN
4.0000 mg | Freq: Four times a day (QID) | INTRAMUSCULAR | Status: DC | PRN
  Administered 2013-03-29: 4 mg via INTRAVENOUS
  Filled 2013-03-29: qty 2

## 2013-03-29 MED ORDER — SODIUM CHLORIDE 0.9 % IV SOLN
INTRAVENOUS | Status: DC
Start: 2013-03-29 — End: 2013-03-29

## 2013-03-29 MED ORDER — ACETAMINOPHEN 325 MG PO TABS
650.0000 mg | ORAL_TABLET | Freq: Four times a day (QID) | ORAL | Status: DC | PRN
  Administered 2013-03-29 – 2013-04-01 (×5): 650 mg via ORAL
  Filled 2013-03-29 (×6): qty 2

## 2013-03-29 NOTE — ED Notes (Addendum)
Pt PA, [name] pt reports "the devil took the phone out of the wall." Pt request help from behavorial help because she "needs to rest."

## 2013-03-29 NOTE — ED Notes (Addendum)
IV attempt x2 by this rn. N Davis at bedside attempting IV insertion. Pt attempt to void on bedpan. Unable to void at present time.

## 2013-03-29 NOTE — Progress Notes (Signed)
Clinical Social Work  CSW attempted to meet with patient in order to complete assessment. CSW went to room but patient in bed with covers over her head. Patient would not speak with CSW at this time and reports she wants to sleep. CSW will follow up at later time.  SweetwaterHolly Morgaine Kimball, [place]CS[place] [phone]

## 2013-03-29 NOTE — ED Notes (Addendum)
Attempted 2 IV on pt, unsuccessful at both attempts. RN notified

## 2013-03-29 NOTE — ED Notes (Signed)
Pt placed on bedpan. Pt unable to void at present time.

## 2013-03-29 NOTE — ED Notes (Signed)
Pt reports she is tired and needs rest. Pt falls asleep during assessment/ in conversation when not directly asked questions.

## 2013-03-29 NOTE — Progress Notes (Signed)
Pt with temp of 102 oral.  O2 sats 88-90 on room air when resting.  Call MD with orders for blood cultures and 2lt n/c to keep sats over 90%.  Pt was given tylenol po.

## 2013-03-29 NOTE — ED Notes (Signed)
Perfecto KingdomS. Coe at bedside attempting IV insertion.

## 2013-03-29 NOTE — Progress Notes (Signed)
   CARE MANAGEMENT ED NOTE 03/29/2013  Patient:  Andre LefortGRAHAM,Hillary   Account Number:  [id number]  Date Initiated:  03/29/2013  Documentation initiated by:  Edd ArbourGIBBS,Martie Muhlbauer  Subjective/Objective Assessment:   53 yr old medicare[place]med[place] pt dx CAP CM consult for medication needs NO pc listed Pt states she does not have a pc but is follow [facility] per pt Reports her son receives her money     Subjective/Objective Assessment Detail:   [facility] because she is covered by [employer] and medicaid services  Pt reports she has her sister and cousin locally but reports [facility] was a [facility] "with [facility]"     Action/Plan:   CM spoke with pt at 1040 (Cm updated ED RN about CM interaction with pt) and returned to see pt at 1340 but pt asleep at 1340   Action/Plan Detail:   Anticipated DC Date:  04/01/2013     Status Recommendation to Physician:   Result of Recommendation:    Other ED Services  Consult Working Plan    DC Planning Services  Other  PCP issues  Outpatient Services - Pt will follow up  CM consult    Choice offered to / List presented to:            Status of service:  Completed, signed off  ED Comments:   ED Comments Detail:

## 2013-03-29 NOTE — Progress Notes (Signed)
UR completed 

## 2013-03-29 NOTE — ED Notes (Signed)
Pt given 2 cups of water to encourage fluid challenge/intake and void for urine sample.

## 2013-03-29 NOTE — H&P (Addendum)
[facility] History and Physical  Libbie Bartley ZOX:[id number] DOB: [birth year] DOA: 03/29/2013  Referring physician: EDP PCP: No primary provider on file.   Chief Complaint: weakness, need my psych meds  HPI: [name] is a 53 y.o. homeless female, resident of [facility] with PMH of schizoaffective disorder, hypertension, arthritis presents to the ER with the above complaints.  She is a very poor historian and reports weakness, cough and chronic back pain. She was seen in the ER and yesterday and was febrile but left before she was seen. Initially when she came to the ER, she wanted help with her psychiatric medications, during her course in the emergency room she spiked a fever of 101, which led to further testing And was noted to have pneumonia.   Review of Systems:  Constitutional:  No weight loss, night sweats, Fevers, chills, fatigue.  HEENT:  No headaches, Difficulty swallowing,Tooth/dental problems,Sore throat,  No sneezing, itching, ear ache, nasal congestion, post nasal drip,  Cardio-vascular:  No chest pain, Orthopnea, PND, swelling in lower extremities, anasarca, dizziness, palpitations  GI:  No heartburn, indigestion, abdominal pain, nausea, vomiting, diarrhea, change in bowel habits, loss of appetite  Resp:  No shortness of breath with exertion or at rest. No excess mucus, no productive cough, No non-productive cough, No coughing up of blood.No change in color of mucus.No wheezing.No chest wall deformity  Skin:  no rash or lesions.  GU:  no dysuria, change in color of urine, no urgency or frequency. No flank pain.  Musculoskeletal:  No joint pain or swelling. No decreased range of motion. No back pain.  Psych:  No change in mood or affect. No depression or anxiety. No memory loss.   Past Medical History  Diagnosis Date  . Arthritis   . Seizures   . Psychiatric illness   . Bipolar 1 disorder   . Schizo-affective schizophrenia   . Obesity   . Substance  abuse    Past Surgical History  Procedure Laterality Date  . Stomach surgery     Social History:  reports that she has been smoking Cigarettes.  She has been smoking about 1.00 pack per day. She has never used smokeless tobacco. She reports that she drinks about 3.6 ounces of alcohol per week. She reports that she uses illicit drugs ("Crack" cocaine and Cocaine) about 5 times per week.  No Known Allergies  Family History  Problem Relation Age of Onset  . Alzheimer's disease Mother   . Depression Mother      Prior to Admission medications   Medication Sig Start Date End Date Taking? Authorizing Provider  amLODipine (NORVASC) 5 MG tablet Take 1 tablet by mouth daily. 02/25/13   Historical Provider, MD  benztropine (COGENTIN) 0.5 MG tablet Take one tablet, 0.5mg  daily at bedtime. For EPS. 07/01/11   Viviann Spare, FNP  divalproex (DEPAKOTE ER) 500 MG 24 hr tablet Take 500 mg by mouth 2 (two) times daily.    Historical Provider, MD  QUEtiapine (SEROQUEL) 50 MG tablet Take 1 tablet (50 mg total) by mouth at bedtime. For sleep and mood stability. 07/01/11   Viviann Spare, FNP  risperiDONE (RISPERDAL) 1 MG tablet Take 1 mg by mouth at bedtime.    Historical Provider, MD  risperiDONE microspheres (RISPERDAL CONSTA) 50 MG injection Inject 2 mLs (50 mg total) into the muscle every 14 (fourteen) days. For psychosis, last given on 07/01/2011. 07/01/11   Viviann Spare, FNP   Physical Exam: Ceasar Mons Vitals:  03/29/13 1334  BP:   Pulse:   Temp: 99.5 F (37.5 C)  Resp:     BP 155/86  Pulse 112  Temp(Src) 99.5 F (37.5 C) (Oral)  Resp 20  SpO2 94%  LMP 01/13/2012  General:  Sleepy, but answers question, obese Eyes: PERRL, normal lids, irises & conjunctiva ENT: grossly normal hearing, lips & tongue Neck: no LAD, masses or thyromegaly Cardiovascular: RRR, no m/r/g. No LE edema. Telemetry: SR, no arrhythmias  Respiratory: CTA bilaterally, no w/r/r. Normal respiratory effort. Abdomen:  soft, ntnd Skin: no rash or induration seen on limited exam Musculoskeletal: grossly normal tone BUE/BLE Psychiatric: grossly normal mood and affect, speech fluent and appropriate Neurologic: grossly non-focal.          Labs on Admission:  Basic Metabolic Panel:  Recent Labs Lab 03/22/13 1700 03/29/13 0950  NA 141 141  K 4.3 4.1  CL 102 101  CO2 28 26  GLUCOSE 93 87  BUN 15 10  CREATININE 0.67 0.72  CALCIUM 8.9 8.9   Liver Function Tests:  Recent Labs Lab 03/29/13 0950  AST 14  ALT 25  ALKPHOS 51  BILITOT <0.2*  PROT 7.4  ALBUMIN 3.3*    Recent Labs Lab 03/29/13 0950  LIPASE 15   No results found for this basename: AMMONIA,  in the last 168 hours CBC:  Recent Labs Lab 03/22/13 1700 03/29/13 0950  WBC 5.7 6.0  NEUTROABS  --  4.1  HGB 12.0 11.4*  HCT 35.2* 34.8*  MCV 86.5 86.6  PLT 262 226   Cardiac Enzymes:  Recent Labs Lab 03/22/13 1700  TROPONINI <0.30    BNP (last 3 results) No results found for this basename: PROBNP,  in the last 8760 hours CBG: No results found for this basename: GLUCAP,  in the last 168 hours  Radiological Exams on Admission: Dg Chest 2 View  03/29/2013   CLINICAL DATA:  53 year old female with fever and cough. Initial encounter.  EXAM: CHEST  2 VIEW  COMPARISON:  10/18/2011 and earlier.  FINDINGS: Stable lung volumes. Normal cardiac size and mediastinal contours. Visualized tracheal air column is within normal limits. No pleural effusion or pneumothorax. Diffuse increased interstitial markings throughout the chest, becoming confluent in areas in the right lung. No acute osseous abnormality identified.  IMPRESSION: Right greater than left diffuse interstitial pulmonary opacity, most compatible with bilateral pneumonia in this setting.   Electronically Signed   By: Augusto GambleLee  Hall M.D.   On: 03/29/2013 12:39    Assessment/Plan  1. Community acquired pneumonia  -start IV ceftriaxone/zithromax -urine legionella and pneumo  Ag -check HIV -check influenza PCR  2.  Schizoaffective disorder -continue home meds -psych consulted  3. Hypertension -continue amlodipine  4. Homelessness -CSW consult  DVT proph: lovenox  Code Status: Full Code Family Communication:none at bedside Disposition Plan: inpatient  Time spent: 50min  [facility] HospitalJOSEPH,Mariafernanda Hendricksen [facility] Pager [phone]

## 2013-03-29 NOTE — ED Notes (Addendum)
This RN started IV on pt, immediately upon needle puncture, pt demanded for needle to be removed. This RN explained thoroughly to patient that needle puncture involves some pain every time and that IV was needed for certain medications, fluids, and that blood could be drawn through IV. Pt made aware that patient may not be able to receive the treatment that the MD recommends and that full evaluation may not be possible without blood draw. This RN requested for pt to allow IV to remain in place for blood draw and IV medication. Patient became increasingly agitated and demanded for needle to be removed immediately, stating "that really hurt, how about you let me do that to you and see how it feels." Due to patient rapidly becoming increasingly irate, this RN removed IV needle without drawing labs or administering medication.

## 2013-03-29 NOTE — ED Notes (Signed)
PA, Neva SeatGreene made aware of pt's temperature. Blankets removed from pt.
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
## 2013-03-29 NOTE — ED Notes (Signed)
Per pt, abdominal pain, back pain-needs meds for her polar disorder

## 2013-03-30 LAB — BASIC METABOLIC PANEL
BUN: 11 mg/dL (ref 6–23)
CO2: 29 meq/L (ref 19–32)
CREATININE: 0.73 mg/dL (ref 0.50–1.10)
Calcium: 8.4 mg/dL (ref 8.4–10.5)
Chloride: 99 mEq/L (ref 96–112)
GFR calc Af Amer: >90 mL/min (ref >90)
GFR calc non Af Amer: >90 mL/min (ref >90)
Glucose, Bld: 90 mg/dL (ref 70–99)
Potassium: 4.6 mEq/L (ref 3.7–5.3)
Sodium: 140 mEq/L (ref 137–147)

## 2013-03-30 LAB — CBC
HCT: 35.6 % — ABNORMAL LOW (ref 36.0–46.0)
Hemoglobin: 11.5 g/dL — ABNORMAL LOW (ref 12.0–15.0)
MCH: 28.7 pg (ref 26.0–34.0)
MCHC: 32.3 g/dL (ref 30.0–36.0)
MCV: 88.8 fL (ref 78.0–100.0)
Platelets: 219 10*3/uL (ref 150–400)
RBC: 4.01 MIL/uL (ref 3.87–5.11)
RDW: 15.1 % (ref 11.5–15.5)
WBC: 7.4 10*3/uL (ref 4.0–10.5)

## 2013-03-30 MED ORDER — RISPERIDONE 0.5 MG PO TABS
0.5000 mg | ORAL_TABLET | Freq: Every day | ORAL | Status: DC
  Administered 2013-03-30 – 2013-04-01 (×3): 0.5 mg via ORAL
  Filled 2013-03-30 (×4): qty 1

## 2013-03-30 MED ORDER — DIVALPROEX SODIUM ER 250 MG PO TB24
250.0000 mg | ORAL_TABLET | Freq: Two times a day (BID) | ORAL | Status: DC
  Administered 2013-03-30 – 2013-04-02 (×7): 250 mg via ORAL
  Filled 2013-03-30 (×8): qty 1

## 2013-03-30 NOTE — ED Provider Notes (Signed)
Medical screening examination/treatment/procedure(s) were performed by non-physician practitioner and as supervising physician I was immediately available for consultation/collaboration.  EKG Interpretation   None        Courtney F Horton, MD 03/30/13 1937 

## 2013-03-31 LAB — BASIC METABOLIC PANEL
BUN: 11 mg/dL (ref 6–23)
CO2: 28 mEq/L (ref 19–32)
CREATININE: 0.72 mg/dL (ref 0.50–1.10)
Calcium: 8 mg/dL — ABNORMAL LOW (ref 8.4–10.5)
Chloride: 99 mEq/L (ref 96–112)
GFR calc non Af Amer: >90 mL/min (ref >90)
Glucose, Bld: 98 mg/dL (ref 70–99)
POTASSIUM: 4.2 meq/L (ref 3.7–5.3)
Sodium: 139 mEq/L (ref 137–147)

## 2013-03-31 LAB — CBC
HEMATOCRIT: 33.8 % — AB (ref 36.0–46.0)
Hemoglobin: 11.1 g/dL — ABNORMAL LOW (ref 12.0–15.0)
MCH: 29.1 pg (ref 26.0–34.0)
MCHC: 32.8 g/dL (ref 30.0–36.0)
MCV: 88.5 fL (ref 78.0–100.0)
Platelets: 193 10*3/uL (ref 150–400)
RBC: 3.82 MIL/uL — ABNORMAL LOW (ref 3.87–5.11)
RDW: 14.7 % (ref 11.5–15.5)
WBC: 5.7 10*3/uL (ref 4.0–10.5)

## 2013-03-31 MED ORDER — IBUPROFEN 400 MG PO TABS
400.0000 mg | ORAL_TABLET | Freq: Once | ORAL | Status: AC
  Administered 2013-03-31: 400 mg via ORAL
  Filled 2013-03-31: qty 1

## 2013-03-31 NOTE — Progress Notes (Addendum)
[facility] PROGRESS NOTE  Mary LefortDebra [name] XBD:[id number] DOB: [birth year] DOA: 03/29/2013 PCP: No primary provider on file.  Assessment/Plan:  1. Community acquired pneumonia  -improving, fever curve down -continue IV ceftriaxone/zithromax  -urine legionella and pneumo Ag pending -HIV negative -influenza PCR negative  2. Schizoaffective disorder  -continue home meds  -psych consulted, but pending  3. Hypertension  -continue amlodipine   4. Homelessness  -CSW consulted  ambulate  DVT proph: lovenox   Code Status: Full Code  Family Communication:none at bedside  Disposition Plan: to be determined   Consultants:  Psych pending  HPI/Subjective: More alert than yetserday  Objective: Filed Vitals:   03/31/13 1400  BP: 118/78  Pulse: 89  Temp: 99.2 F (37.3 C)  Resp:     Intake/Output Summary (Last 24 hours) at 03/31/13 1522 Last data filed at 03/31/13 1300  Gross per 24 hour  Intake    820 ml  Output    200 ml  Net    620 ml   There were no vitals filed for this visit.  Exam:   General:  Alert, awake, oriented x3  Cardiovascular: S1S2/RRR  Respiratory: CTAB  Abdomen: soft, Nt, BS present  Musculoskeletal:no edema c/c   Data Reviewed: Basic Metabolic Panel:  Recent Labs Lab 03/29/13 0950 03/30/13 0619 03/31/13 0548  NA 141 140 139  K 4.1 4.6 4.2  CL 101 99 99  CO2 26 29 28   GLUCOSE 87 90 98  BUN 10 11 11   CREATININE 0.72 0.73 0.72  CALCIUM 8.9 8.4 8.0*   Liver Function Tests:  Recent Labs Lab 03/29/13 0950  AST 14  ALT 25  ALKPHOS 51  BILITOT <0.2*  PROT 7.4  ALBUMIN 3.3*    Recent Labs Lab 03/29/13 0950  LIPASE 15   No results found for this basename: AMMONIA,  in the last 168 hours CBC:  Recent Labs Lab 03/29/13 0950 03/30/13 0703 03/31/13 0548  WBC 6.0 7.4 5.7  NEUTROABS 4.1  --   --   HGB 11.4* 11.5* 11.1*  HCT 34.8* 35.6* 33.8*  MCV 86.6 88.8 88.5  PLT 226 219 193   Cardiac Enzymes: No results  found for this basename: CKTOTAL, CKMB, CKMBINDEX, TROPONINI,  in the last 168 hours BNP (last 3 results) No results found for this basename: PROBNP,  in the last 8760 hours CBG: No results found for this basename: GLUCAP,  in the last 168 hours  Recent Results (from the past 240 hour(s))  CULTURE, BLOOD (ROUTINE X 2)     Status: None   Collection Time    03/29/13 11:00 PM      Result Value Range Status   Specimen Description BLOOD RIGHT ARM   Final   Special Requests BOTTLES DRAWN AEROBIC AND ANAEROBIC 1CC   Final   Culture  Setup Time     Final   Value: 03/30/2013 01:04     Performed at [facility]   Culture     Final   Value:        BLOOD CULTURE RECEIVED NO GROWTH TO DATE CULTURE WILL BE HELD FOR 5 DAYS BEFORE ISSUING A FINAL NEGATIVE REPORT     Performed at [facility]   Report Status PENDING   Incomplete  CULTURE, BLOOD (ROUTINE X 2)     Status: None   Collection Time    03/29/13 11:08 PM      Result Value Range Status   Specimen Description BLOOD LEFT ARM   Final  Special Requests BOTTLES DRAWN AEROBIC AND ANAEROBIC 2CC   Final   Culture  Setup Time     Final   Value: 03/30/2013 01:06     Performed at [facility]   Culture     Final   Value:        BLOOD CULTURE RECEIVED NO GROWTH TO DATE CULTURE WILL BE HELD FOR 5 DAYS BEFORE ISSUING A FINAL NEGATIVE REPORT     Performed at [facility]   Report Status PENDING   Incomplete     Studies: No results found.  Scheduled Meds: . amLODipine  5 mg Oral Daily  . azithromycin  500 mg Intravenous Q24H  . benztropine  0.5 mg Oral QHS  . cefTRIAXone (ROCEPHIN)  IV  1 g Intravenous Q24H  . divalproex  250 mg Oral BID  . enoxaparin (LOVENOX) injection  40 mg Subcutaneous Q24H  . QUEtiapine  50 mg Oral QHS  . risperiDONE  0.5 mg Oral QHS   Continuous Infusions: . sodium chloride 50 mL/hr at 03/31/13 8657    Active Problems:   Hypertension   CAP (community acquired pneumonia)    Schizoaffective disorder   Pneumonia    Time spent:    [facility]  [facility] Pager [phone] 7PM-7AM, please contact night-coverage at [email or web address], password [email or web address] 03/31/2013, 3:22 PM  LOS: 2 days

## 2013-03-31 NOTE — Progress Notes (Signed)
[facility] PROGRESS NOTE  Mary LefortDebra Carcione UJW:[id number] DOB: [birth year] DOA: 03/29/2013 PCP: No primary provider on file.  Assessment/Plan:  1. Community acquired pneumonia  -continue IV ceftriaxone/zithromax  -urine legionella and pneumo Ag pending -HIV negative -influenza PCR negative  2. Schizoaffective disorder  -continue home meds  -psych consulted, but pending  3. Hypertension  -continue amlodipine   4. Homelessness  -CSW consulted  DVT proph: lovenox   Code Status: Full Code  Family Communication:none at bedside  Disposition Plan: to be determined   Consultants:  Psych pending  HPI/Subjective: Sleepy this am  Objective: Filed Vitals:   03/31/13 0510  BP: 127/83  Pulse: 114  Temp: 100 F (37.8 C)  Resp: 18    Intake/Output Summary (Last 24 hours) at 03/31/13 0827 Last data filed at 03/31/13 0230  Gross per 24 hour  Intake    360 ml  Output    200 ml  Net    160 ml   There were no vitals filed for this visit.  Exam:   General:  Sleepy, arousible, oriented x3  Cardiovascular: S1S2/RRR  Respiratory: CTAB  Abdomen: soft, Nt, BS present  Musculoskeletal:no edema c/c   Data Reviewed: Basic Metabolic Panel:  Recent Labs Lab 03/29/13 0950 03/30/13 0619 03/31/13 0548  NA 141 140 139  K 4.1 4.6 4.2  CL 101 99 99  CO2 26 29 28   GLUCOSE 87 90 98  BUN 10 11 11   CREATININE 0.72 0.73 0.72  CALCIUM 8.9 8.4 8.0*   Liver Function Tests:  Recent Labs Lab 03/29/13 0950  AST 14  ALT 25  ALKPHOS 51  BILITOT <0.2*  PROT 7.4  ALBUMIN 3.3*    Recent Labs Lab 03/29/13 0950  LIPASE 15   No results found for this basename: AMMONIA,  in the last 168 hours CBC:  Recent Labs Lab 03/29/13 0950 03/30/13 0703 03/31/13 0548  WBC 6.0 7.4 5.7  NEUTROABS 4.1  --   --   HGB 11.4* 11.5* 11.1*  HCT 34.8* 35.6* 33.8*  MCV 86.6 88.8 88.5  PLT 226 219 193   Cardiac Enzymes: No results found for this basename: CKTOTAL, CKMB,  CKMBINDEX, TROPONINI,  in the last 168 hours BNP (last 3 results) No results found for this basename: PROBNP,  in the last 8760 hours CBG: No results found for this basename: GLUCAP,  in the last 168 hours  Recent Results (from the past 240 hour(s))  CULTURE, BLOOD (ROUTINE X 2)     Status: None   Collection Time    03/29/13 11:00 PM      Result Value Range Status   Specimen Description BLOOD RIGHT ARM   Final   Special Requests BOTTLES DRAWN AEROBIC AND ANAEROBIC 1CC   Final   Culture  Setup Time     Final   Value: 03/30/2013 01:04     Performed at [facility]   Culture     Final   Value:        BLOOD CULTURE RECEIVED NO GROWTH TO DATE CULTURE WILL BE HELD FOR 5 DAYS BEFORE ISSUING A FINAL NEGATIVE REPORT     Performed at [facility]   Report Status PENDING   Incomplete  CULTURE, BLOOD (ROUTINE X 2)     Status: None   Collection Time    03/29/13 11:08 PM      Result Value Range Status   Specimen Description BLOOD LEFT ARM   Final   Special Requests BOTTLES DRAWN AEROBIC  AND ANAEROBIC 2CC   Final   Culture  Setup Time     Final   Value: 03/30/2013 01:06     Performed at [facility]   Culture     Final   Value:        BLOOD CULTURE RECEIVED NO GROWTH TO DATE CULTURE WILL BE HELD FOR 5 DAYS BEFORE ISSUING A FINAL NEGATIVE REPORT     Performed at [facility]   Report Status PENDING   Incomplete     Studies: Dg Chest 2 View  03/29/2013   CLINICAL DATA:  53 year old female with fever and cough. Initial encounter.  EXAM: CHEST  2 VIEW  COMPARISON:  10/18/2011 and earlier.  FINDINGS: Stable lung volumes. Normal cardiac size and mediastinal contours. Visualized tracheal air column is within normal limits. No pleural effusion or pneumothorax. Diffuse increased interstitial markings throughout the chest, becoming confluent in areas in the right lung. No acute osseous abnormality identified.  IMPRESSION: Right greater than left diffuse interstitial  pulmonary opacity, most compatible with bilateral pneumonia in this setting.   Electronically Signed   By: Augusto Gamble M.D.   On: 03/29/2013 12:39    Scheduled Meds: . amLODipine  5 mg Oral Daily  . azithromycin  500 mg Intravenous Q24H  . benztropine  0.5 mg Oral QHS  . cefTRIAXone (ROCEPHIN)  IV  1 g Intravenous Q24H  . divalproex  250 mg Oral BID  . enoxaparin (LOVENOX) injection  40 mg Subcutaneous Q24H  . ibuprofen  400 mg Oral Once  . QUEtiapine  50 mg Oral QHS  . risperiDONE  0.5 mg Oral QHS   Continuous Infusions: . sodium chloride 100 mL/hr at 03/30/13 1517    Active Problems:   Hypertension   CAP (community acquired pneumonia)   Schizoaffective disorder   Pneumonia    Time spent:    [facility]  [facility] Pager [phone] 7PM-7AM, please contact night-coverage at [email or web address], password [facility] 03/31/2013, 8:27 AM  LOS: 2 days

## 2013-04-01 LAB — STREP PNEUMONIAE URINARY ANTIGEN: Strep Pneumo Urinary Antigen: NEGATIVE

## 2013-04-01 MED ORDER — LEVOFLOXACIN 500 MG PO TABS
500.0000 mg | ORAL_TABLET | Freq: Every day | ORAL | Status: DC
  Administered 2013-04-01 – 2013-04-02 (×2): 500 mg via ORAL
  Filled 2013-04-01 (×2): qty 1

## 2013-04-01 NOTE — Progress Notes (Signed)
[facility] PROGRESS NOTE  Andre LefortDebra Krager AVW:[id number] DOB: 13-Mar-1961 DOA: 03/29/2013 PCP: No primary provider on file.  Assessment/Plan:  1. Community acquired pneumonia  -improving, fever curve down -s/p 3days of  IV ceftriaxone/zithromax, change to Po levaquin  -urine legionella and pneumo Ag pending -HIV negative -influenza PCR negative  2. Schizoaffective disorder  -continue home meds  -psych consulted, but pending  3. Hypertension  -continue amlodipine   4. Homelessness  -CSW consulted  ambulate  DVT proph: lovenox   Code Status: Full Code  Family Communication:none at bedside  Disposition Plan: CSW consulted, shelter in next days or so   Consultants:  Psych pending  HPI/Subjective: Feels better, still coughing, wants to talk to CSW  Objective: Filed Vitals:   04/01/13 0637  BP: 134/81  Pulse: 100  Temp: 100.2 F (37.9 C)  Resp: 18    Intake/Output Summary (Last 24 hours) at 04/01/13 1413 Last data filed at 04/01/13 1329  Gross per 24 hour  Intake    175 ml  Output    850 ml  Net   -675 ml   There were no vitals filed for this visit.  Exam:   General:  Alert, awake, oriented x3  Cardiovascular: S1S2/RRR  Respiratory: CTAB  Abdomen: soft, Nt, BS present  Musculoskeletal:no edema c/c   Data Reviewed: Basic Metabolic Panel:  Recent Labs Lab 03/29/13 0950 03/30/13 0619 03/31/13 0548  NA 141 140 139  K 4.1 4.6 4.2  CL 101 99 99  CO2 26 29 28   GLUCOSE 87 90 98  BUN 10 11 11   CREATININE 0.72 0.73 0.72  CALCIUM 8.9 8.4 8.0*   Liver Function Tests:  Recent Labs Lab 03/29/13 0950  AST 14  ALT 25  ALKPHOS 51  BILITOT <0.2*  PROT 7.4  ALBUMIN 3.3*    Recent Labs Lab 03/29/13 0950  LIPASE 15   No results found for this basename: AMMONIA,  in the last 168 hours CBC:  Recent Labs Lab 03/29/13 0950 03/30/13 0703 03/31/13 0548  WBC 6.0 7.4 5.7  NEUTROABS 4.1  --   --   HGB 11.4* 11.5* 11.1*  HCT 34.8*  35.6* 33.8*  MCV 86.6 88.8 88.5  PLT 226 219 193   Cardiac Enzymes: No results found for this basename: CKTOTAL, CKMB, CKMBINDEX, TROPONINI,  in the last 168 hours BNP (last 3 results) No results found for this basename: PROBNP,  in the last 8760 hours CBG: No results found for this basename: GLUCAP,  in the last 168 hours  Recent Results (from the past 240 hour(s))  CULTURE, BLOOD (ROUTINE X 2)     Status: None   Collection Time    03/29/13 11:00 PM      Result Value Range Status   Specimen Description BLOOD RIGHT ARM   Final   Special Requests BOTTLES DRAWN AEROBIC AND ANAEROBIC 1CC   Final   Culture  Setup Time     Final   Value: 03/30/2013 01:04     Performed at [facility]   Culture     Final   Value:        BLOOD CULTURE RECEIVED NO GROWTH TO DATE CULTURE WILL BE HELD FOR 5 DAYS BEFORE ISSUING A FINAL NEGATIVE REPORT     Performed at [facility]   Report Status PENDING   Incomplete  CULTURE, BLOOD (ROUTINE X 2)     Status: None   Collection Time    03/29/13 11:08 PM  Result Value Range Status   Specimen Description BLOOD LEFT ARM   Final   Special Requests BOTTLES DRAWN AEROBIC AND ANAEROBIC 2CC   Final   Culture  Setup Time     Final   Value: 03/30/2013 01:06     Performed at [facility]   Culture     Final   Value:        BLOOD CULTURE RECEIVED NO GROWTH TO DATE CULTURE WILL BE HELD FOR 5 DAYS BEFORE ISSUING A FINAL NEGATIVE REPORT     Performed at [facility]   Report Status PENDING   Incomplete     Studies: No results found.  Scheduled Meds: . amLODipine  5 mg Oral Daily  . benztropine  0.5 mg Oral QHS  . divalproex  250 mg Oral BID  . enoxaparin (LOVENOX) injection  40 mg Subcutaneous Q24H  . levofloxacin  500 mg Oral Daily  . QUEtiapine  50 mg Oral QHS  . risperiDONE  0.5 mg Oral QHS   Continuous Infusions: . sodium chloride Stopped (04/01/13 0127)    Active Problems:   Hypertension   CAP (community acquired  pneumonia)   Schizoaffective disorder   Pneumonia    Time spent:    [facility]  [facility] Pager [phone] 7PM-7AM, please contact night-coverage at [email or web address], password [facility] 04/01/2013, 2:13 PM  LOS: 3 days

## 2013-04-01 NOTE — Progress Notes (Signed)
PT Cancellation Note  Patient Details Name: Mary LefortDebra [name] MRN: [id number] DOB: Sep 03, 1960   Cancelled Treatment:    Reason Eval/Treat Not Completed: Patient declined, no reason specified (pt stated she didn't want to get out of bed and that she walks fine. Will re-attempt later. )   Tamala SerUhlenberg, Zana Biancardi Kistler 04/01/2013, 12:32 PM [phone]

## 2013-04-01 NOTE — Progress Notes (Signed)
Clinical Social Work Department BRIEF PSYCHOSOCIAL ASSESSMENT 04/01/2013  Patient:  [name], [name]     Account Number:  [id number]     Admit date:  03/29/2013  Clinical Social Worker:  Earlie Server  Date/Time:  04/01/2013 02:30 PM  Referred by:  Physician  Date Referred:  04/01/2013 Referred for  Homelessness   Other Referral:   Interview type:  Patient Other interview type:    PSYCHOSOCIAL DATA Living Status:  FACILITY Admitted from facility:  [facility] Level of care:   Primary support name:  [name] Primary support relationship to patient:  CHILD, ADULT Degree of support available:   Lacking    CURRENT CONCERNS Current Concerns  Post-Acute Placement   Other Concerns:    SOCIAL WORK ASSESSMENT / PLAN CSW received referral from MD during progression meeting stating that patient is homeless. CSW reviewed chart and met with patient at bedside. CSW introduced myself and explained role.    Patient reports that she was at recovery rehab in [facility] for about 2 months. Patient states that when she came back to [place] that she was homeless and started staying at [facility]. Patient reports she does not like living in the shelter and wants to move into her own place. CSW spoke with patient regarding income. Patient reports that she receives a disability check but her son is her payee and he spends her money every month. CSW spoke with patient regarding changing her payee to a reliable person in order to assist with placement. Patient reports no money at this time to assist with finding housing. CSW and patient spoke about ALF or group home placement which patient refuses. Patient receives ACT services through [facility] for bipolar and schizophrenia and they are assisting with housing concerns. Patient states she will return back to shelter and will work with ACT team to find an apartment on her own.    Patient reports she does not want to talk anymore and is going to sleep. Patient  closes her eyes and will not respond anymore. CSW will continue to follow.   Assessment/plan status:  Referral to [employer] Other assessment/ plan:   Information/referral to community resources:   ALF and group home information    PATIENT'S/FAMILY'S RESPONSE TO PLAN OF CARE: Patient alert and oriented but minimally engaged during assessment. Patient reports that she knows that ACT team has helped others get their own apartment and she will not go back to ALF or group home. Patient aware that disability check will need to be sorted prior to housing placement. Patient reports she is frustrated with the system but has worked with [facility] too long to leave them now. Patient avoids eye contact and abruptly ends conversation.       [name], [place] [phone]

## 2013-04-01 NOTE — ED Provider Notes (Signed)
Medical screening examination/treatment/procedure(s) were performed by non-physician practitioner and as supervising physician I was immediately available for consultation/collaboration.  EKG Interpretation    Date/Time:    Ventricular Rate:    PR Interval:    QRS Duration:   QT Interval:    QTC Calculation:   R Axis:     Text Interpretation:                Richardean Canalavid H Jashley Yellin, MD 04/01/13 1025

## 2013-04-02 DIAGNOSIS — R52 Pain, unspecified: Secondary | ICD-10-CM

## 2013-04-02 DIAGNOSIS — I10 Essential (primary) hypertension: Secondary | ICD-10-CM

## 2013-04-02 LAB — LEGIONELLA ANTIGEN, URINE: LEGIONELLA ANTIGEN, URINE: NEGATIVE

## 2013-04-02 MED ORDER — UNABLE TO FIND: Status: DC

## 2013-04-02 MED ORDER — DIVALPROEX SODIUM ER 500 MG PO TB24: 500.0000 mg | ORAL_TABLET | Freq: Two times a day (BID) | ORAL | Status: DC

## 2013-04-02 MED ORDER — AMLODIPINE BESYLATE 5 MG PO TABS: 5.0000 mg | ORAL_TABLET | Freq: Every day | ORAL | Status: DC

## 2013-04-02 MED ORDER — GUAIFENESIN ER 600 MG PO TB12: 600.0000 mg | ORAL_TABLET | Freq: Two times a day (BID) | ORAL | Status: DC

## 2013-04-02 MED ORDER — LEVOFLOXACIN 500 MG PO TABS: 500.0000 mg | ORAL_TABLET | Freq: Every day | ORAL | Status: DC

## 2013-04-02 NOTE — Progress Notes (Signed)
PT Cancellation Note  Patient Details Name: Mary LefortDebra Slyter MRN: [id number] DOB: 11-22-1960   Cancelled Treatment:    Reason Eval/Treat Not Completed: PT screened, no needs identified, will sign off (pt states she gets up, declines to get up and take a walk with PT at this time.)   Rada HayHill, Kambre Messner Elizabeth 04/02/2013, 11:06 AM Blanchard KelchKaren Osborne Serio PT [phone]

## 2013-04-05 LAB — CULTURE, BLOOD (ROUTINE X 2)
Culture: NO GROWTH
Culture: NO GROWTH

## 2013-04-09 NOTE — Discharge Summary (Signed)
Physician Discharge Summary  Mary LefortDebra Divis [id number] DOB: [birth year] DOA: 03/29/2013  PCP: No primary provider on file.  Admit date: 03/29/2013 Discharge date: 04/02/2013  Time spent: 45minutes  Recommendations for Outpatient Follow-up:  1. Fu with PCP in 1 week 2. Fu with Psychiatry in 10days 3. Fu CXR in 4-6weeks  Discharge Diagnoses:    CAP (community acquired pneumonia)   Schizoaffective disorder   Pneumonia   Hypertension   Fever  Discharge Condition: stable  Diet recommendation: low sodium  There were no vitals filed for this visit.  History of present illness:  Chief Complaint: weakness, need my psych meds  HPI: Mary LefortDebra [name] is a 53 y.o. homeless female, resident of [facility] with PMH of schizoaffective disorder, hypertension, arthritis presents to the ER with the above complaints.  She is a very poor historian and reports weakness, cough and chronic back pain.  She was seen in the ER and yesterday and was febrile but left before she was seen.  Initially when she came to the ER, she wanted help with her psychiatric medications, during her course in the emergency room she spiked a fever of 101, which led to further testing And was noted to have pneumonia.   Hospital Course:  1. Community acquired pneumonia  -improving, fever curve down  -s/p 3days of IV ceftriaxone/zithromax, changed to Po levaquin  -urine legionella and pneumo Ag negative -HIV negative  -influenza PCR negative   2. Schizoaffective disorder  -continue home meds  - Fu with Psychiatry as outpatient  3. Hypertension  -continue amlodipine   4. Homelessness  -CSW consulted, declined any assistance at this time   Discharge Exam: Filed Vitals:   04/02/13 0500  BP: 142/83  Pulse: 89  Temp: 100.4 F (38 C)  Resp: 22    General: AAOx3 Cardiovascular: S1S2/RRR Respiratory: CTAB  Discharge Instructions  Discharge Orders   Future Orders Complete By Expires   Diet - low sodium heart  healthy  As directed    Increase activity slowly  As directed        Medication List    STOP taking these medications       amantadine 100 MG capsule  Commonly known as:  SYMMETREL      TAKE these medications       amLODipine 5 MG tablet  Commonly known as:  NORVASC  Take 1 tablet (5 mg total) by mouth daily.     benztropine 0.5 MG tablet  Commonly known as:  COGENTIN  Take 0.5 mg by mouth 2 (two) times daily.     divalproex 500 MG 24 hr tablet  Commonly known as:  DEPAKOTE ER  Take 1 tablet (500 mg total) by mouth 2 (two) times daily.     guaiFENesin 600 MG 12 hr tablet  Commonly known as:  MUCINEX  Take 1 tablet (600 mg total) by mouth 2 (two) times daily. For 5 days     levofloxacin 500 MG tablet  Commonly known as:  LEVAQUIN  Take 1 tablet (500 mg total) by mouth daily. For 5 days     metoprolol tartrate 25 MG tablet  Commonly known as:  LOPRESSOR  Take 25 mg by mouth 2 (two) times daily.     risperiDONE 2 MG tablet  Commonly known as:  RISPERDAL  Take 2 mg by mouth at bedtime.     risperiDONE microspheres 50 MG injection  Commonly known as:  RISPERDAL CONSTA  Inject 50 mg into the muscle every 14 (fourteen)  days.     UNABLE TO FIND  This note is to certify that Ms.Iglesia was hospitalized due to medical illness and may need best rest for the next couple of days       No Known Allergies     Follow-up Information   Follow up with PCP. Schedule an appointment as soon as possible for a visit in 1 week.       The results of significant diagnostics from this hospitalization (including imaging, microbiology, ancillary and laboratory) are listed below for reference.    Significant Diagnostic Studies: Dg Chest 2 View  03/29/2013   CLINICAL DATA:  53 year old female with fever and cough. Initial encounter.  EXAM: CHEST  2 VIEW  COMPARISON:  10/18/2011 and earlier.  FINDINGS: Stable lung volumes. Normal cardiac size and mediastinal contours. Visualized  tracheal air column is within normal limits. No pleural effusion or pneumothorax. Diffuse increased interstitial markings throughout the chest, becoming confluent in areas in the right lung. No acute osseous abnormality identified.  IMPRESSION: Right greater than left diffuse interstitial pulmonary opacity, most compatible with bilateral pneumonia in this setting.   Electronically Signed   By: Augusto Gamble M.D.   On: 03/29/2013 12:39   Dg Lumbar Spine Complete  03/22/2013   CLINICAL DATA:  Chronic back pain  EXAM: LUMBAR SPINE - COMPLETE 4+ VIEW  COMPARISON:  None.  FINDINGS: There are 5 nonrib bearing lumbar-type vertebral bodies. The vertebral body heights are maintained. The alignment is anatomic. There is no spondylolysis. There is no acute fracture or static listhesis. The disc spaces are maintained. There is moderate facet arthropathy at L4-5 and L5-S1.  The SI joints are unremarkable.  IMPRESSION: Negative.   Electronically Signed   By: Elige Ko   On: 03/22/2013 16:48    Microbiology: No results found for this or any previous visit (from the past 240 hour(s)).   Labs: Basic Metabolic Panel: No results found for this basename: NA, K, CL, CO2, GLUCOSE, BUN, CREATININE, CALCIUM, MG, PHOS,  in the last 168 hours Liver Function Tests: No results found for this basename: AST, ALT, ALKPHOS, BILITOT, PROT, ALBUMIN,  in the last 168 hours No results found for this basename: LIPASE, AMYLASE,  in the last 168 hours No results found for this basename: AMMONIA,  in the last 168 hours CBC: No results found for this basename: WBC, NEUTROABS, HGB, HCT, MCV, PLT,  in the last 168 hours Cardiac Enzymes: No results found for this basename: CKTOTAL, CKMB, CKMBINDEX, TROPONINI,  in the last 168 hours BNP: BNP (last 3 results) No results found for this basename: PROBNP,  in the last 8760 hours CBG: No results found for this basename: GLUCAP,  in the last 168 hours     Signed:  Magaly Pollina  [facility] 04/09/2013, 5:29 PM

## 2013-11-28 ENCOUNTER — Emergency Department (HOSPITAL_COMMUNITY)
Admission: EM | Admit: 2013-11-28 | Discharge: 2013-11-29 | Disposition: A | Discharge status: Home / Self Care | Payer: Medicare Other | Attending: Emergency Medicine | Admitting: Emergency Medicine

## 2013-11-28 ENCOUNTER — Encounter (HOSPITAL_COMMUNITY): Payer: Self-pay | Admitting: Emergency Medicine

## 2013-11-28 DIAGNOSIS — F10220 Alcohol dependence with intoxication, uncomplicated: Secondary | ICD-10-CM

## 2013-11-28 DIAGNOSIS — Z792 Long term (current) use of antibiotics: Secondary | ICD-10-CM | POA: Diagnosis not present

## 2013-11-28 DIAGNOSIS — Z008 Encounter for other general examination: Secondary | ICD-10-CM | POA: Insufficient documentation

## 2013-11-28 DIAGNOSIS — F1410 Cocaine abuse, uncomplicated: Secondary | ICD-10-CM

## 2013-11-28 DIAGNOSIS — G40909 Epilepsy, unspecified, not intractable, without status epilepticus: Secondary | ICD-10-CM | POA: Diagnosis not present

## 2013-11-28 DIAGNOSIS — Z79899 Other long term (current) drug therapy: Secondary | ICD-10-CM | POA: Diagnosis not present

## 2013-11-28 DIAGNOSIS — F259 Schizoaffective disorder, unspecified: Secondary | ICD-10-CM | POA: Diagnosis present

## 2013-11-28 DIAGNOSIS — F411 Generalized anxiety disorder: Secondary | ICD-10-CM | POA: Insufficient documentation

## 2013-11-28 DIAGNOSIS — F1021 Alcohol dependence, in remission: Secondary | ICD-10-CM

## 2013-11-28 DIAGNOSIS — F319 Bipolar disorder, unspecified: Secondary | ICD-10-CM | POA: Insufficient documentation

## 2013-11-28 DIAGNOSIS — E669 Obesity, unspecified: Secondary | ICD-10-CM | POA: Insufficient documentation

## 2013-11-28 DIAGNOSIS — Z8739 Personal history of other diseases of the musculoskeletal system and connective tissue: Secondary | ICD-10-CM | POA: Insufficient documentation

## 2013-11-28 DIAGNOSIS — F250 Schizoaffective disorder, bipolar type: Secondary | ICD-10-CM

## 2013-11-28 DIAGNOSIS — F1020 Alcohol dependence, uncomplicated: Secondary | ICD-10-CM | POA: Diagnosis not present

## 2013-11-28 DIAGNOSIS — F17200 Nicotine dependence, unspecified, uncomplicated: Secondary | ICD-10-CM | POA: Insufficient documentation

## 2013-11-28 DIAGNOSIS — F1920 Other psychoactive substance dependence, uncomplicated: Secondary | ICD-10-CM

## 2013-11-28 LAB — CBC
HEMATOCRIT: 39 % (ref 36.0–46.0)
Hemoglobin: 13.3 g/dL (ref 12.0–15.0)
MCH: 29.7 pg (ref 26.0–34.0)
MCHC: 34.1 g/dL (ref 30.0–36.0)
MCV: 87.1 fL (ref 78.0–100.0)
PLATELETS: 246 10*3/uL (ref 150–400)
RBC: 4.48 MIL/uL (ref 3.87–5.11)
RDW: 14.6 % (ref 11.5–15.5)
WBC: 4.8 10*3/uL (ref 4.0–10.5)

## 2013-11-28 LAB — RAPID URINE DRUG SCREEN, HOSP PERFORMED
AMPHETAMINES: NOT DETECTED
BENZODIAZEPINES: NOT DETECTED
Barbiturates: NOT DETECTED
Cocaine: NOT DETECTED
Opiates: NOT DETECTED
TETRAHYDROCANNABINOL: NOT DETECTED

## 2013-11-28 LAB — COMPREHENSIVE METABOLIC PANEL
ALBUMIN: 3.5 g/dL (ref 3.5–5.2)
ALT: 18 U/L (ref 0–35)
ANION GAP: 11 (ref 5–15)
AST: 16 U/L (ref 0–37)
Alkaline Phosphatase: 62 U/L (ref 39–117)
BILIRUBIN TOTAL: 0.3 mg/dL (ref 0.3–1.2)
BUN: 7 mg/dL (ref 6–23)
CHLORIDE: 103 meq/L (ref 96–112)
CO2: 29 mEq/L (ref 19–32)
Calcium: 9.5 mg/dL (ref 8.4–10.5)
Creatinine, Ser: 0.68 mg/dL (ref 0.50–1.10)
GFR calc Af Amer: >90 mL/min (ref >90)
GFR calc non Af Amer: >90 mL/min (ref >90)
Glucose, Bld: 86 mg/dL (ref 70–99)
Potassium: 4.3 mEq/L (ref 3.7–5.3)
Sodium: 143 mEq/L (ref 137–147)
TOTAL PROTEIN: 7.6 g/dL (ref 6.0–8.3)

## 2013-11-28 LAB — ETHANOL: Alcohol, Ethyl (B): <11 mg/dL (ref 0–11)

## 2013-11-28 LAB — ACETAMINOPHEN LEVEL: Acetaminophen (Tylenol), Serum: <15 ug/mL (ref 10–30)

## 2013-11-28 LAB — VALPROIC ACID LEVEL: Valproic Acid Lvl: 52.9 ug/mL (ref 50.0–100.0)

## 2013-11-28 LAB — SALICYLATE LEVEL: Salicylate Lvl: <2 mg/dL — ABNORMAL LOW (ref 2.8–20.0)

## 2013-11-28 MED ORDER — RISPERIDONE 2 MG PO TABS
2.0000 mg | ORAL_TABLET | Freq: Every day | ORAL | Status: DC
  Administered 2013-11-28: 2 mg via ORAL
  Filled 2013-11-28: qty 1

## 2013-11-28 MED ORDER — DIVALPROEX SODIUM ER 500 MG PO TB24
500.0000 mg | ORAL_TABLET | Freq: Two times a day (BID) | ORAL | Status: DC
  Administered 2013-11-28 – 2013-11-29 (×2): 500 mg via ORAL
  Filled 2013-11-28 (×3): qty 1

## 2013-11-28 MED ORDER — ACETAMINOPHEN 325 MG PO TABS: 650.0000 mg | ORAL_TABLET | Freq: Four times a day (QID) | ORAL | Status: DC | PRN

## 2013-11-28 MED ORDER — LORAZEPAM 1 MG PO TABS: 0.0000 mg | ORAL_TABLET | Freq: Four times a day (QID) | ORAL | Status: DC

## 2013-11-28 MED ORDER — METOPROLOL TARTRATE 25 MG PO TABS
25.0000 mg | ORAL_TABLET | Freq: Two times a day (BID) | ORAL | Status: DC
  Administered 2013-11-28 (×2): 25 mg via ORAL
  Filled 2013-11-28 (×2): qty 1

## 2013-11-28 MED ORDER — LORAZEPAM 1 MG PO TABS: 0.0000 mg | ORAL_TABLET | Freq: Two times a day (BID) | ORAL | Status: DC

## 2013-11-28 MED ORDER — BENZTROPINE MESYLATE 1 MG PO TABS
0.5000 mg | ORAL_TABLET | Freq: Two times a day (BID) | ORAL | Status: DC
  Administered 2013-11-28 – 2013-11-29 (×3): 0.5 mg via ORAL
  Filled 2013-11-28 (×3): qty 1

## 2013-11-28 MED ORDER — AMLODIPINE BESYLATE 5 MG PO TABS
5.0000 mg | ORAL_TABLET | Freq: Every day | ORAL | Status: DC
  Administered 2013-11-28: 5 mg via ORAL
  Filled 2013-11-28 (×2): qty 1

## 2013-11-28 MED ORDER — IBUPROFEN 800 MG PO TABS
800.0000 mg | ORAL_TABLET | Freq: Four times a day (QID) | ORAL | Status: DC | PRN
  Administered 2013-11-28 – 2013-11-29 (×3): 800 mg via ORAL
  Filled 2013-11-28 (×3): qty 1

## 2013-11-28 NOTE — BH Assessment (Signed)
Assessment Note  [name] is an 53 y.o. female. Patient is requesting detox from alcohol and cocaine. Patient reports drinking alcohol daily beer, Pint of [place], and smoking crack cocaine.  Patient reports current withdrawal symptoms are stomach pains and back pains.  Patient reports that she is receiving services with [facility] ACTT team [phone].  Patient reports that she was seen by her ACTT team on yesterday for medication monitoring.  Patient reports current visual hallucinations of "Killers" trying to get her.  Patient reports owing some dealers money and now she see their faces everywhere she goes and this is terrifying to her.  Patient reports being homeless and last living with her son who is her payee.    CSW called and spoke with her son Ethelene Browns [phone] or [phone] for collateral information.  He reports being the guardian of his mother for the last 5 months but today is regrets that decision.  He reports that his mother will not take her medications, continues to use drugs, socially inappropriate behaviors, and will not care for her own hygiene.  He confirms that the patient owes money to some dealers in the [facility] area and he had to go pick her up to get her out of harms way.  He is recommending the patient go into a long-term treatment program for her safety and as an attempt to stabilize her mental health and drug abuse.    CSW ran patient with Dr. Jannifer Franklin who recommends to be evaluated in the morning by a psychiatrist.    Axis I: Schizoaffective Disorder and Alcohol Abuse, Cocaine Abuse Axis II: Deferred Axis III:  Past Medical History  Diagnosis Date  . Arthritis   . Seizures   . Psychiatric illness   . Bipolar 1 disorder   . Schizo-affective schizophrenia   . Obesity   . Substance abuse    Axis IV: economic problems, housing problems, occupational problems, other psychosocial or environmental problems, problems related to social  environment, problems with access to health care services and problems with primary support group Axis V: 41-50 serious symptoms  Past Medical History:  Past Medical History  Diagnosis Date  . Arthritis   . Seizures   . Psychiatric illness   . Bipolar 1 disorder   . Schizo-affective schizophrenia   . Obesity   . Substance abuse     Past Surgical History  Procedure Laterality Date  . Stomach surgery      Family History:  Family History  Problem Relation Age of Onset  . Alzheimer's disease Mother   . Depression Mother     Social History:  reports that she has been smoking Cigarettes.  She has been smoking about 1.00 pack per day. She has never used smokeless tobacco. She reports that she drinks about 3.6 ounces of alcohol per week. She reports that she uses illicit drugs ("Crack" cocaine and Cocaine) about 5 times per week.  Additional Social History:     CIWA: CIWA-Ar BP: 128/69 mmHg Pulse Rate: 67 Nausea and Vomiting: no nausea and no vomiting Tactile Disturbances: none Tremor: no tremor Auditory Disturbances: not present Paroxysmal Sweats: no sweat visible Visual Disturbances: not present Anxiety: no anxiety, at ease Headache, Fullness in Head: none present Agitation: normal activity Orientation and Clouding of Sensorium: oriented and can do serial additions CIWA-Ar Total: 0 COWS:    Allergies: No Known Allergies  Home Medications:  (Not in a hospital admission)  OB/GYN Status:  Patient's last menstrual period  was 01/13/2012.  General Assessment Data Location of Assessment: [facility] ED ACT Assessment: Yes Is this a Tele or Face-to-Face Assessment?: Face-to-Face Is this an Initial Assessment or a Re-assessment for this encounter?: Initial Assessment Living Arrangements: Children Can pt return to current living arrangement?: Yes Admission Status: Voluntary Is patient capable of signing voluntary admission?: No Transfer from: Home Referral Source:  Self/Family/Friend  Medical Screening Exam [facility] Walk-in ONLY) Medical Exam completed: Yes  [facility] Crisis Care Plan Living Arrangements: Children Name of Psychiatrist: [facility] Name of Therapist: [facility]     Risk to self with the past 6 months Suicidal Ideation: No-Not Currently/Within Last 6 Months Suicidal Intent: No-Not Currently/Within Last 6 Months Is patient at risk for suicide?: No Suicidal Plan?: No-Not Currently/Within Last 6 Months Access to Means: No What has been your use of drugs/alcohol within the last 12 months?: Alcohol, Crack Previous Attempts/Gestures: No Intentional Self Injurious Behavior: None Family Suicide History: No Recent stressful life event(s): Loss (Comment);Other (Comment);Conflict (Comment) (SA) Persecutory voices/beliefs?: No Depression: Yes Depression Symptoms: Loss of interest in usual pleasures;Feeling angry/irritable;Feeling worthless/self pity;Guilt Substance abuse history and/or treatment for substance abuse?: Yes  Risk to Others within the past 6 months Homicidal Ideation: No Thoughts of Harm to Others: No-Not Currently Present/Within Last 6 Months Current Homicidal Intent: No-Not Currently/Within Last 6 Months Current Homicidal Plan: No-Not Currently/Within Last 6 Months Access to Homicidal Means: No History of harm to others?: No Assessment of Violence: None Noted Does patient have access to weapons?: No Criminal Charges Pending?: No Does patient have a court date: No  Psychosis Hallucinations: Visual Delusions: Unspecified  Mental Status Report Appear/Hygiene: Disheveled Eye Contact: Fair Motor Activity: Freedom of movement Speech: Pressured Level of Consciousness: Alert Mood: Labile Affect: Anxious Anxiety Level: Moderate Thought Processes: Circumstantial Judgement: Impaired Orientation: Person;Place;Time Obsessive Compulsive Thoughts/Behaviors: None  Cognitive Functioning Concentration: Fair Memory:  Remote Intact;Recent Intact IQ: Average Insight: Poor Impulse Control: Fair Appetite: Fair Sleep: No Change Vegetative Symptoms: None  ADLScreening [facility] Assessment Services) Patient's cognitive ability adequate to safely complete daily activities?: Yes Patient able to express need for assistance with ADLs?: Yes Independently performs ADLs?: Yes (appropriate for developmental age)     Prior Outpatient Therapy Prior Outpatient Therapy: Yes Prior Therapy Facilty/Provider(s): [facility]  ADL Screening (condition at time of admission) Patient's cognitive ability adequate to safely complete daily activities?: Yes Patient able to express need for assistance with ADLs?: Yes Independently performs ADLs?: Yes (appropriate for developmental age)         Values / Beliefs Cultural Requests During Hospitalization: None Spiritual Requests During Hospitalization: None   Advance Directives (For Healthcare) Does patient have an advance directive?: No Would patient like information on creating an advanced directive?: No - patient declined information    Additional Information 1:1 In Past 12 Months?: No CIRT Risk: No Elopement Risk: No Does patient have medical clearance?: No     Disposition:  Disposition Initial Assessment Completed for this Encounter: Yes Disposition of Patient: Other dispositions Other disposition(s): Other (Comment) (Re-evaulated in the morning by Psychiatrist)  On Site Evaluation by:   Reviewed with Physician:    Maryelizabeth Rowan A 11/28/2013 11:16 PM

## 2013-11-28 NOTE — ED Notes (Signed)
Per EMS-patient drinks daily-has some wine and a beer this am-homeless-wants detox

## 2013-11-28 NOTE — ED Notes (Signed)
Patient transferred from the triage section of the ED.  Introduced self to patient and oriented her to surroundings.  Offered food and fluids.  Patient requested and was given a sandwich and a cup of coffee.  She is cooperative and sad.  States she is homeless because her "son put her out."

## 2013-11-28 NOTE — Progress Notes (Addendum)
  CARE MANAGEMENT ED NOTE 11/28/2013  Patient:  [name], [name]   Account Number:  [id number]  Date Initiated:  11/28/2013  Documentation initiated by:  Edd Arbour  Subjective/Objective Assessment:   53 yr old medicare pt [place] pt C/O drinks daily-has some wine and a beer this am-homeless-wants detox Pt states she is from [place]     Subjective/Objective Assessment Detail:   Pt informed CM in ED triage she "do not have a place to stay" after asking "Are they going to keep me?" Pt informed CM "I hope they keep me" Reports pcp is [facility] "but I ain't been there in a long time"  States she also seen at [facility]  Pt requested at blanket  Pt informed CM she goes to "[place]"     Action/Plan:   obtained pcp information from pt updated epic   Action/Plan Detail:   Anticipated DC Date:       Status Recommendation to Physician:   Result of Recommendation:    Other ED Services  Consult Working Plan    DC [employer]  Other  PCP issues  Outpatient Services - Pt will follow up    Choice offered to / List presented to:            Status of service:  Completed, signed off  ED Comments:   ED Comments Detail:

## 2013-11-28 NOTE — ED Provider Notes (Signed)
CSN: [id number]     Arrival date & time 11/28/13  1330 History   First MD Initiated Contact with Patient 11/28/13 1336     Chief Complaint  Patient presents with  . ETOH detox      (Consider location/radiation/quality/duration/timing/severity/associated sxs/prior Treatment) The history is provided by the patient.  pt w hx etoh abuse c/o daily etoh abuse and wanting rehab detox. Also w hx bipolar disorder, and states under a great deal of stress, and notes feelings of depression.  States compliant w normal meds, no recent change in meds. Recent stressors with '[facility] abandoning me', and problems w living situation/homelessness.  Denies hx complicated etoh withdrawal or dts.  Also notes crack cocaine abuse. States physical health at baseline. No recent illness, no recent trauma or fall. Denies pain or injury. Denies thoughts of harm to self or others.      Past Medical History  Diagnosis Date  . Arthritis   . Seizures   . Psychiatric illness   . Bipolar 1 disorder   . Schizo-affective schizophrenia   . Obesity   . Substance abuse    Past Surgical History  Procedure Laterality Date  . Stomach surgery     Family History  Problem Relation Age of Onset  . Alzheimer's disease Mother   . Depression Mother    History  Substance Use Topics  . Smoking status: Current Every Day Smoker -- 1.00 packs/day    Types: Cigarettes  . Smokeless tobacco: Never Used  . Alcohol Use: 3.6 oz/week    6 Cans of beer per week   OB History   Grav Para Term Preterm Abortions TAB SAB Ect Mult Living                 Review of Systems  Constitutional: Negative for fever and chills.  HENT: Negative for sore throat.   Eyes: Negative for redness.  Respiratory: Negative for shortness of breath.   Cardiovascular: Negative for chest pain.  Gastrointestinal: Negative for vomiting and abdominal pain.  Endocrine: Negative for polyuria.  Genitourinary: Negative for flank pain.   Musculoskeletal: Negative for back pain and neck pain.  Skin: Negative for rash.  Neurological: Negative for weakness, numbness and headaches.  Hematological: Does not bruise/bleed easily.  Psychiatric/Behavioral: Positive for dysphoric mood. The patient is nervous/anxious.       Allergies  Review of patient's allergies indicates no known allergies.  Home Medications   Prior to Admission medications   Medication Sig Start Date End Date Taking? Authorizing Provider  amLODipine (NORVASC) 5 MG tablet Take 1 tablet (5 mg total) by mouth daily. 04/02/13   Zannie Cove, MD  benztropine (COGENTIN) 0.5 MG tablet Take 0.5 mg by mouth 2 (two) times daily.    Historical Provider, MD  divalproex (DEPAKOTE ER) 500 MG 24 hr tablet Take 1 tablet (500 mg total) by mouth 2 (two) times daily. 04/02/13   Zannie Cove, MD  guaiFENesin (MUCINEX) 600 MG 12 hr tablet Take 1 tablet (600 mg total) by mouth 2 (two) times daily. For 5 days 04/02/13   Zannie Cove, MD  levofloxacin (LEVAQUIN) 500 MG tablet Take 1 tablet (500 mg total) by mouth daily. For 5 days 04/02/13   Zannie Cove, MD  metoprolol tartrate (LOPRESSOR) 25 MG tablet Take 25 mg by mouth 2 (two) times daily.    Historical Provider, MD  risperiDONE (RISPERDAL) 2 MG tablet Take 2 mg by mouth at bedtime.    Historical Provider, MD  risperiDONE  microspheres (RISPERDAL CONSTA) 50 MG injection Inject 50 mg into the muscle every 14 (fourteen) days.    Historical Provider, MD  UNABLE TO FIND This note is to certify that Ms.Hangartner was hospitalized due to medical illness and may need best rest for the next couple of days 04/02/13   Zannie Cove, MD   BP 118/77  Pulse 76  Temp(Src) 98.2 F (36.8 C) (Oral)  Resp 16  SpO2 98%  LMP 01/13/2012 Physical Exam  Nursing note and vitals reviewed. Constitutional: She is oriented to person, place, and time. She appears well-developed and well-nourished. No distress.  HENT:  Mouth/Throat: Oropharynx is  clear and moist.  Eyes: Conjunctivae are normal. No scleral icterus.  Neck: Neck supple. No tracheal deviation present.  Cardiovascular: Normal rate, regular rhythm, normal heart sounds and intact distal pulses.   Pulmonary/Chest: Effort normal and breath sounds normal. No respiratory distress.  Abdominal: Soft. Normal appearance and bowel sounds are normal. She exhibits no distension and no mass. There is no tenderness. There is no rebound and no guarding.  Genitourinary:  No cva tenderness  Musculoskeletal: She exhibits no edema and no tenderness.  Neurological: She is alert and oriented to person, place, and time.  Skin: Skin is warm and dry. No rash noted. She is not diaphoretic.  Psychiatric:  Tearful/anxious. Denies SI    ED Course  Procedures (including critical care time) Labs Review   MDM   Labs.   Psych team consulted.  Reviewed nursing notes and prior charts for additional history.   1540, labs pending, signed out to Dr Fonnie Jarvis to check labs when back, and address accordingly.  Disposition per psych team.     Suzi Roots, MD 11/28/13 1540

## 2013-11-28 NOTE — ED Notes (Signed)
Bed: WBH41 Expected date:  Expected time:  Means of arrival:  Comments: Hold for triage 3 

## 2013-11-29 ENCOUNTER — Encounter (HOSPITAL_COMMUNITY): Payer: Self-pay | Admitting: Psychiatry

## 2013-11-29 DIAGNOSIS — F1010 Alcohol abuse, uncomplicated: Secondary | ICD-10-CM

## 2013-11-29 DIAGNOSIS — F259 Schizoaffective disorder, unspecified: Secondary | ICD-10-CM | POA: Diagnosis not present

## 2013-11-29 LAB — URINALYSIS, ROUTINE W REFLEX MICROSCOPIC
Bilirubin Urine: NEGATIVE
GLUCOSE, UA: NEGATIVE mg/dL
Hgb urine dipstick: NEGATIVE
KETONES UR: NEGATIVE mg/dL
Leukocytes, UA: NEGATIVE
Nitrite: NEGATIVE
PROTEIN: NEGATIVE mg/dL
Specific Gravity, Urine: 1.014 (ref 1.005–1.030)
Urobilinogen, UA: 0.2 mg/dL (ref 0.0–1.0)
pH: 7 (ref 5.0–8.0)

## 2013-11-29 MED ORDER — NICOTINE 21 MG/24HR TD PT24
21.0000 mg | MEDICATED_PATCH | Freq: Once | TRANSDERMAL | Status: DC
  Administered 2013-11-29: 21 mg via TRANSDERMAL

## 2013-11-29 NOTE — ED Provider Notes (Signed)
Labs unremarkable; TTS recommends eval by psychiatrist in AM.  Hurman Horn, MD 12/11/13 [phone]

## 2013-11-29 NOTE — Consult Note (Signed)
[facility] Face-to-Face Psychiatry Consult   Reason for Consult:  Alcohol rehab Referring Physician:  EDP  [name] is an 53 y.o. female. Total Time spent with patient: 20 minutes  Assessment: AXIS I:  Alcohol Abuse and Schizoaffective Disorder AXIS II:  Deferred AXIS III:   Past Medical History  Diagnosis Date  . Arthritis   . Seizures   . Psychiatric illness   . Bipolar 1 disorder   . Schizo-affective schizophrenia   . Obesity   . Substance abuse    AXIS IV:  housing problems, other psychosocial or environmental problems, problems related to social environment and problems with primary support group AXIS V:  61-70 mild symptoms  Plan:  No evidence of imminent risk to self or others at present.  Dr. Darleene Cleaver assessed the patient and concurs with the plan.  Subjective:   [name] is a 53 y.o. female patient does not warrant admission.  HPI:  On admission:  53 y.o. female. Patient is requesting detox from alcohol and cocaine. Patient reports drinking alcohol daily beer, Pint of [place], and smoking crack cocaine. Patient reports current withdrawal symptoms are stomach pains and back pains. Patient reports that she is receiving services with [facility] ACTT team [phone]. Patient reports that she was seen by her ACTT team on yesterday for medication monitoring. Patient reports current visual hallucinations of "Killers" trying to get her. Patient reports owing some dealers money and now she see their faces everywhere she goes and this is terrifying to her. Patient reports being homeless and last living with her son who is her payee. CSW called and spoke with her son [name] [phone] or [phone] for collateral information. He reports being the guardian of his mother for the last 5 months but today is regrets that decision. He reports that his mother will not take her medications, continues to use drugs, socially inappropriate behaviors, and will not care for her  own hygiene. He confirms that the patient owes money to some dealers in the [facility] area and he had to go pick her up to get her out of harms way. He is recommending the patient go into a long-term treatment program for her safety and as an attempt to stabilize her mental health and drug abuse.  Today:  The patient does not want detox or rehab, drug screen is clear.  Denies suicidal/homicidal ideations, hallucinations.  She wants to leave and follow-up with her ACT team, [facility].  Dusty complains about her payee who is her son.  She reports abuse of alcohol and crack but does not want help at this time.  HPI Elements:   Location:  generalized. Quality:  chronic. Severity:  mild. Timing:  intermittent. Duration:  years. Context:  stressors.  Past Psychiatric History: Past Medical History  Diagnosis Date  . Arthritis   . Seizures   . Psychiatric illness   . Bipolar 1 disorder   . Schizo-affective schizophrenia   . Obesity   . Substance abuse     reports that she has been smoking Cigarettes.  She has been smoking about 1.00 pack per day. She has never used smokeless tobacco. She reports that she drinks about 3.6 ounces of alcohol per week. She reports that she uses illicit drugs ("Crack" cocaine and Cocaine) about 5 times per week. Family History  Problem Relation Age of Onset  . Alzheimer's disease Mother   . Depression Mother    Family History Substance Abuse: Yes, Describe: (Alcohol and Crack) Family  Supports: Yes, List: (Son is Guardian) Living Arrangements: Children Can pt return to current living arrangement?: Yes   Allergies:  No Known Allergies  ACT Assessment Complete:  Yes:    Educational Status    Risk to Self: Risk to self with the past 6 months Suicidal Ideation: No-Not Currently/Within Last 6 Months Suicidal Intent: No-Not Currently/Within Last 6 Months Is patient at risk for suicide?: No Suicidal Plan?: No-Not Currently/Within Last 6 Months Access to Means:  No What has been your use of drugs/alcohol within the last 12 months?: Alcohol, Crack Previous Attempts/Gestures: No Intentional Self Injurious Behavior: None Family Suicide History: No Recent stressful life event(s): Loss (Comment);Other (Comment);Conflict (Comment) (SA) Persecutory voices/beliefs?: No Depression: Yes Depression Symptoms: Loss of interest in usual pleasures;Feeling angry/irritable;Feeling worthless/self pity;Guilt Substance abuse history and/or treatment for substance abuse?: Yes  Risk to Others: Risk to Others within the past 6 months Homicidal Ideation: No Thoughts of Harm to Others: No-Not Currently Present/Within Last 6 Months Current Homicidal Intent: No-Not Currently/Within Last 6 Months Current Homicidal Plan: No-Not Currently/Within Last 6 Months Access to Homicidal Means: No History of harm to others?: No Assessment of Violence: None Noted Does patient have access to weapons?: No Criminal Charges Pending?: No Does patient have a court date: No  Abuse:    Prior Inpatient Therapy:    Prior Outpatient Therapy: Prior Outpatient Therapy Prior Outpatient Therapy: Yes Prior Therapy Facilty/Provider(s): [facility]  Additional Information: Additional Information 1:1 In Past 12 Months?: No CIRT Risk: No Elopement Risk: No Does patient have medical clearance?: No                  Objective: Blood pressure 117/85, pulse 85, temperature 98.4 F (36.9 C), temperature source Oral, resp. rate 16, last menstrual period 01/13/2012, SpO2 98.00%.There is no weight on file to calculate BMI. Results for orders placed during the hospital encounter of 11/28/13 (from the past 72 hour(s))  URINE RAPID DRUG SCREEN (HOSP PERFORMED)     Status: None   Collection Time    11/28/13  2:18 PM      Result Value Ref Range   Opiates NONE DETECTED  NONE DETECTED   Cocaine NONE DETECTED  NONE DETECTED   Benzodiazepines NONE DETECTED  NONE DETECTED   Amphetamines  NONE DETECTED  NONE DETECTED   Tetrahydrocannabinol NONE DETECTED  NONE DETECTED   Barbiturates NONE DETECTED  NONE DETECTED   Comment:            DRUG SCREEN FOR MEDICAL PURPOSES     ONLY.  IF CONFIRMATION IS NEEDED     FOR ANY PURPOSE, NOTIFY LAB     WITHIN 5 DAYS.                LOWEST DETECTABLE LIMITS     FOR URINE DRUG SCREEN     Drug Class       Cutoff (ng/mL)     Amphetamine      1000     Barbiturate      200     Benzodiazepine   741     Tricyclics       287     Opiates          300     Cocaine          300     THC              50  CBC     Status: None   Collection  Time    11/28/13  3:21 PM      Result Value Ref Range   WBC 4.8  4.0 - 10.5 K/uL   RBC 4.48  3.87 - 5.11 MIL/uL   Hemoglobin 13.3  12.0 - 15.0 g/dL   HCT 39.0  36.0 - 46.0 %   MCV 87.1  78.0 - 100.0 fL   MCH 29.7  26.0 - 34.0 pg   MCHC 34.1  30.0 - 36.0 g/dL   RDW 14.6  11.5 - 15.5 %   Platelets 246  150 - 400 K/uL  COMPREHENSIVE METABOLIC PANEL     Status: None   Collection Time    11/28/13  3:21 PM      Result Value Ref Range   Sodium 143  137 - 147 mEq/L   Potassium 4.3  3.7 - 5.3 mEq/L   Chloride 103  96 - 112 mEq/L   CO2 29  19 - 32 mEq/L   Glucose, Bld 86  70 - 99 mg/dL   BUN 7  6 - 23 mg/dL   Creatinine, Ser 0.68  0.50 - 1.10 mg/dL   Calcium 9.5  8.4 - 10.5 mg/dL   Total Protein 7.6  6.0 - 8.3 g/dL   Albumin 3.5  3.5 - 5.2 g/dL   AST 16  0 - 37 U/L   ALT 18  0 - 35 U/L   Alkaline Phosphatase 62  39 - 117 U/L   Total Bilirubin 0.3  0.3 - 1.2 mg/dL   GFR calc non Af Amer >90  >90 mL/min   GFR calc Af Amer >90  >90 mL/min   Comment: (NOTE)     The eGFR has been calculated using the CKD EPI equation.     This calculation has not been validated in all clinical situations.     eGFR's persistently <90 mL/min signify possible Chronic Kidney     Disease.   Anion gap 11  5 - 15  ETHANOL     Status: None   Collection Time    11/28/13  3:21 PM      Result Value Ref Range   Alcohol, Ethyl (B)  <11  0 - 11 mg/dL   Comment:            LOWEST DETECTABLE LIMIT FOR     SERUM ALCOHOL IS 11 mg/dL     FOR MEDICAL PURPOSES ONLY  VALPROIC ACID LEVEL     Status: None   Collection Time    11/28/13  3:21 PM      Result Value Ref Range   Valproic Acid Lvl 52.9  50.0 - 100.0 ug/mL   Comment: Performed at [facility]     Status: None   Collection Time    11/28/13  3:21 PM      Result Value Ref Range   Acetaminophen (Tylenol), Serum <15.0  10 - 30 ug/mL   Comment:            THERAPEUTIC CONCENTRATIONS VARY     SIGNIFICANTLY. A RANGE OF 10-30     ug/mL MAY BE AN EFFECTIVE     CONCENTRATION FOR MANY PATIENTS.     HOWEVER, SOME ARE BEST TREATED     AT CONCENTRATIONS OUTSIDE THIS     RANGE.     ACETAMINOPHEN CONCENTRATIONS     >150 ug/mL AT 4 HOURS AFTER     INGESTION AND >50 ug/mL AT 12     HOURS AFTER INGESTION  ARE     OFTEN ASSOCIATED WITH TOXIC     REACTIONS.  SALICYLATE LEVEL     Status: Abnormal   Collection Time    11/28/13  3:21 PM      Result Value Ref Range   Salicylate Lvl <0.9 (*) 2.8 - 20.0 mg/dL  URINALYSIS, ROUTINE W REFLEX MICROSCOPIC     Status: Abnormal   Collection Time    11/29/13  4:26 AM      Result Value Ref Range   Color, Urine YELLOW  YELLOW   APPearance CLOUDY (*) CLEAR   Specific Gravity, Urine 1.014  1.005 - 1.030   pH 7.0  5.0 - 8.0   Glucose, UA NEGATIVE  NEGATIVE mg/dL   Hgb urine dipstick NEGATIVE  NEGATIVE   Bilirubin Urine NEGATIVE  NEGATIVE   Ketones, ur NEGATIVE  NEGATIVE mg/dL   Protein, ur NEGATIVE  NEGATIVE mg/dL   Urobilinogen, UA 0.2  0.0 - 1.0 mg/dL   Nitrite NEGATIVE  NEGATIVE   Leukocytes, UA NEGATIVE  NEGATIVE   Comment: MICROSCOPIC NOT DONE ON URINES WITH NEGATIVE PROTEIN, BLOOD, LEUKOCYTES, NITRITE, OR GLUCOSE <1000 mg/dL.   Labs are reviewed and are pertinent for no medical issues needing attention.  Current Facility-Administered Medications  Medication Dose Route Frequency Provider Last Rate  Last Dose  . acetaminophen (TYLENOL) tablet 650 mg  650 mg Oral Q6H PRN Jasper Riling. Pickering, MD      . amLODipine (NORVASC) tablet 5 mg  5 mg Oral Daily Mirna Mires, MD   5 mg at 11/28/13 1719  . benztropine (COGENTIN) tablet 0.5 mg  0.5 mg Oral BID Mirna Mires, MD   0.5 mg at 11/29/13 0917  . divalproex (DEPAKOTE ER) 24 hr tablet 500 mg  500 mg Oral BID Mirna Mires, MD   500 mg at 11/29/13 0917  . ibuprofen (ADVIL,MOTRIN) tablet 800 mg  800 mg Oral Q6H PRN Jasper Riling. Pickering, MD   800 mg at 11/29/13 0841  . LORazepam (ATIVAN) tablet 0-4 mg  0-4 mg Oral 4 times per day Mirna Mires, MD       Followed by  . [START ON 11/30/2013] LORazepam (ATIVAN) tablet 0-4 mg  0-4 mg Oral Q12H Mirna Mires, MD      . metoprolol tartrate (LOPRESSOR) tablet 25 mg  25 mg Oral BID Mirna Mires, MD   25 mg at 11/28/13 2140  . nicotine (NICODERM CQ - dosed in mg/24 hours) patch 21 mg  21 mg Transdermal Once Waylan Boga, NP   21 mg at 11/29/13 0840  . risperiDONE (RISPERDAL) tablet 2 mg  2 mg Oral QHS Mirna Mires, MD   2 mg at 11/28/13 2140   Current Outpatient Prescriptions  Medication Sig Dispense Refill  . benztropine (COGENTIN) 0.5 MG tablet Take 0.5 mg by mouth 2 (two) times daily.      . divalproex (DEPAKOTE ER) 500 MG 24 hr tablet Take 1 tablet (500 mg total) by mouth 2 (two) times daily.      . risperiDONE (RISPERDAL) 2 MG tablet Take 2 mg by mouth at bedtime.      . risperiDONE microspheres (RISPERDAL CONSTA) 50 MG injection Inject 50 mg into the muscle every 14 (fourteen) days.      Marland Kitchen UNABLE TO FIND This note is to certify that Ms.Viscardi was hospitalized due to medical illness and may need best rest for the next couple of days  1 %  0  Psychiatric Specialty Exam:     Blood pressure 117/85, pulse 85, temperature 98.4 F (36.9 C), temperature source Oral, resp. rate 16, last menstrual period 01/13/2012, SpO2 98.00%.There is no weight on file to calculate BMI.  General Appearance:  Casual  Eye Contact::  Good  Speech:  Normal Rate  Volume:  Increased  Mood:  Anxious  Affect:  Congruent  Thought Process:  Coherent  Orientation:  Full (Time, Place, and Person)  Thought Content:  WDL  Suicidal Thoughts:  No  Homicidal Thoughts:  No  Memory:  Immediate;   Good Recent;   Good Remote;   Good  Judgement:  Fair  Insight:  Fair  Psychomotor Activity:  Normal  Concentration:  Good  Recall:  Good  Fund of Knowledge:Good  Language: Good  Akathisia:  No  Handed:  Right  AIMS (if indicated):     Assets:  Agricultural consultant Physical Health Resilience Social Support  Sleep:      Musculoskeletal: Strength & Muscle Tone: within normal limits Gait & Station: normal Patient leans: N/A  Treatment Plan Summary: Discharge home with follow-up with her ACT team, [facility].  Waylan Boga, [place] 11/29/2013 10:27 AM  Patient seen, evaluated and I agree with notes by Nurse Practitioner. Corena Pilgrim, MD

## 2013-11-29 NOTE — ED Notes (Signed)
Patient complains of urinary frequency and wants her urine checked. Respirations equal and unlabored, skin warn and dry. No acute distress noted.

## 2013-11-29 NOTE — [facility] Suicide Risk Assessment (Signed)
Suicide Risk Assessment  Discharge Assessment     Demographic Factors:  Low socioeconomic status  Total Time spent with patient: 20 minutes  Psychiatric Specialty Exam:     Blood pressure 117/85, pulse 85, temperature 98.4 F (36.9 C), temperature source Oral, resp. rate 16, last menstrual period 01/13/2012, SpO2 98.00%.There is no weight on file to calculate BMI.  General Appearance: Casual  Eye Contact::  Good  Speech:  Normal Rate  Volume:  Increased  Mood:  Anxious  Affect:  Congruent  Thought Process:  Coherent  Orientation:  Full (Time, Place, and Person)  Thought Content:  WDL  Suicidal Thoughts:  No  Homicidal Thoughts:  No  Memory:  Immediate;   Good Recent;   Good Remote;   Good  Judgement:  Fair  Insight:  Fair  Psychomotor Activity:  Normal  Concentration:  Good  Recall:  Good  Fund of Knowledge:Good  Language: Good  Akathisia:  No  Handed:  Right  AIMS (if indicated):     Assets:  Architect Physical Health Resilience Social Support  Sleep:      Musculoskeletal: Strength & Muscle Tone: within normal limits Gait & Station: normal Patient leans: N/A  Mental Status Per Nursing Assessment::   On Admission:   rehab request  Current Mental Status by Physician: NA  Loss Factors: NA  Historical Factors: NA  Risk Reduction Factors:   Sense of responsibility to family, Positive social support and Positive therapeutic relationship  Continued Clinical Symptoms:  Anxiety  Cognitive Features That Contribute To Risk:  None  Suicide Risk:  Minimal: No identifiable suicidal ideation.  Patients presenting with no risk factors but with morbid ruminations; may be classified as minimal risk based on the severity of the depressive symptoms  Discharge Diagnoses:   AXIS I:  Alcohol Abuse and Schizoaffective Disorder AXIS II:  Deferred AXIS III:   Past Medical History  Diagnosis Date  . Arthritis   .  Seizures   . Psychiatric illness   . Bipolar 1 disorder   . Schizo-affective schizophrenia   . Obesity   . Substance abuse    AXIS IV:  economic problems, housing problems, other psychosocial or environmental problems, problems related to social environment and problems with primary support group AXIS V:  61-70 mild symptoms  Plan Of Care/Follow-up recommendations:  Activity:  as tolerated Diet:  low-sodium heart healthy diet  Is patient on multiple antipsychotic therapies at discharge:  No   Has Patient had three or more failed trials of antipsychotic monotherapy by history:  No  Recommended Plan for Multiple Antipsychotic Therapies: NA    Geroldine Esquivias, PMH-NP 11/29/2013, 10:36 AM

## 2014-01-04 ENCOUNTER — Emergency Department (HOSPITAL_COMMUNITY)
Admission: EM | Admit: 2014-01-04 | Discharge: 2014-01-05 | Disposition: A | Discharge status: Home / Self Care | Payer: Medicare Other | Attending: Emergency Medicine | Admitting: Emergency Medicine

## 2014-01-04 ENCOUNTER — Encounter (HOSPITAL_COMMUNITY): Payer: Self-pay | Admitting: Emergency Medicine

## 2014-01-04 DIAGNOSIS — T39012A Poisoning by aspirin, intentional self-harm, initial encounter: Secondary | ICD-10-CM | POA: Diagnosis not present

## 2014-01-04 DIAGNOSIS — F1410 Cocaine abuse, uncomplicated: Secondary | ICD-10-CM | POA: Diagnosis not present

## 2014-01-04 DIAGNOSIS — G40909 Epilepsy, unspecified, not intractable, without status epilepticus: Secondary | ICD-10-CM | POA: Insufficient documentation

## 2014-01-04 DIAGNOSIS — T43592A Poisoning by other antipsychotics and neuroleptics, intentional self-harm, initial encounter: Secondary | ICD-10-CM | POA: Insufficient documentation

## 2014-01-04 DIAGNOSIS — Z79899 Other long term (current) drug therapy: Secondary | ICD-10-CM | POA: Diagnosis not present

## 2014-01-04 DIAGNOSIS — T50902A Poisoning by unspecified drugs, medicaments and biological substances, intentional self-harm, initial encounter: Secondary | ICD-10-CM

## 2014-01-04 DIAGNOSIS — T426X2A Poisoning by other antiepileptic and sedative-hypnotic drugs, intentional self-harm, initial encounter: Secondary | ICD-10-CM | POA: Insufficient documentation

## 2014-01-04 DIAGNOSIS — F259 Schizoaffective disorder, unspecified: Secondary | ICD-10-CM | POA: Diagnosis not present

## 2014-01-04 DIAGNOSIS — E669 Obesity, unspecified: Secondary | ICD-10-CM | POA: Insufficient documentation

## 2014-01-04 DIAGNOSIS — Z8739 Personal history of other diseases of the musculoskeletal system and connective tissue: Secondary | ICD-10-CM | POA: Insufficient documentation

## 2014-01-04 DIAGNOSIS — F319 Bipolar disorder, unspecified: Secondary | ICD-10-CM | POA: Diagnosis not present

## 2014-01-04 DIAGNOSIS — Z720 Tobacco use: Secondary | ICD-10-CM | POA: Diagnosis not present

## 2014-01-04 DIAGNOSIS — Z046 Encounter for general psychiatric examination, requested by authority: Secondary | ICD-10-CM | POA: Diagnosis present

## 2014-01-04 DIAGNOSIS — F1020 Alcohol dependence, uncomplicated: Secondary | ICD-10-CM

## 2014-01-04 DIAGNOSIS — F1920 Other psychoactive substance dependence, uncomplicated: Secondary | ICD-10-CM

## 2014-01-04 DIAGNOSIS — F1910 Other psychoactive substance abuse, uncomplicated: Secondary | ICD-10-CM

## 2014-01-04 LAB — COMPREHENSIVE METABOLIC PANEL
ALBUMIN: 3.2 g/dL — AB (ref 3.5–5.2)
ALK PHOS: 55 U/L (ref 39–117)
ALT: 24 U/L (ref 0–35)
AST: 15 U/L (ref 0–37)
Anion gap: 12 (ref 5–15)
BUN: 12 mg/dL (ref 6–23)
CALCIUM: 8.8 mg/dL (ref 8.4–10.5)
CO2: 27 mEq/L (ref 19–32)
Chloride: 102 mEq/L (ref 96–112)
Creatinine, Ser: 0.71 mg/dL (ref 0.50–1.10)
GFR calc Af Amer: >90 mL/min (ref >90)
GFR calc non Af Amer: >90 mL/min (ref >90)
Glucose, Bld: 102 mg/dL — ABNORMAL HIGH (ref 70–99)
POTASSIUM: 4 meq/L (ref 3.7–5.3)
Sodium: 141 mEq/L (ref 137–147)
TOTAL PROTEIN: 6.8 g/dL (ref 6.0–8.3)
Total Bilirubin: <0.2 mg/dL — ABNORMAL LOW (ref 0.3–1.2)

## 2014-01-04 LAB — URINE MICROSCOPIC-ADD ON

## 2014-01-04 LAB — CBC
HEMATOCRIT: 36.3 % (ref 36.0–46.0)
Hemoglobin: 12.1 g/dL (ref 12.0–15.0)
MCH: 29.3 pg (ref 26.0–34.0)
MCHC: 33.3 g/dL (ref 30.0–36.0)
MCV: 87.9 fL (ref 78.0–100.0)
Platelets: 225 10*3/uL (ref 150–400)
RBC: 4.13 MIL/uL (ref 3.87–5.11)
RDW: 14.6 % (ref 11.5–15.5)
WBC: 6.9 10*3/uL (ref 4.0–10.5)

## 2014-01-04 LAB — URINALYSIS, ROUTINE W REFLEX MICROSCOPIC
BILIRUBIN URINE: NEGATIVE
GLUCOSE, UA: NEGATIVE mg/dL
Hgb urine dipstick: NEGATIVE
Ketones, ur: NEGATIVE mg/dL
Nitrite: NEGATIVE
PH: 5.5 (ref 5.0–8.0)
Protein, ur: NEGATIVE mg/dL
Specific Gravity, Urine: 1.022 (ref 1.005–1.030)
Urobilinogen, UA: 0.2 mg/dL (ref 0.0–1.0)

## 2014-01-04 LAB — RAPID URINE DRUG SCREEN, HOSP PERFORMED
Amphetamines: NOT DETECTED
Barbiturates: NOT DETECTED
Benzodiazepines: NOT DETECTED
COCAINE: POSITIVE — AB
OPIATES: NOT DETECTED
Tetrahydrocannabinol: NOT DETECTED

## 2014-01-04 LAB — VALPROIC ACID LEVEL: Valproic Acid Lvl: 47.3 ug/mL — ABNORMAL LOW (ref 50.0–100.0)

## 2014-01-04 LAB — ETHANOL

## 2014-01-04 LAB — SALICYLATE LEVEL: SALICYLATE LVL: 2.8 mg/dL (ref 2.8–20.0)

## 2014-01-04 LAB — ACETAMINOPHEN LEVEL: Acetaminophen (Tylenol), Serum: <15 ug/mL (ref 10–30)

## 2014-01-04 MED ORDER — THIAMINE HCL 100 MG/ML IJ SOLN: 100.0000 mg | Freq: Every day | INTRAMUSCULAR | Status: DC

## 2014-01-04 MED ORDER — NICOTINE 21 MG/24HR TD PT24: 21.0000 mg | MEDICATED_PATCH | Freq: Every day | TRANSDERMAL | Status: DC

## 2014-01-04 MED ORDER — LORAZEPAM 1 MG PO TABS: 0.0000 mg | ORAL_TABLET | Freq: Two times a day (BID) | ORAL | Status: DC

## 2014-01-04 MED ORDER — SODIUM BICARBONATE 8.4 % IV SOLN: 50.0000 meq | Freq: Once | INTRAVENOUS | Status: DC

## 2014-01-04 MED ORDER — ACETAMINOPHEN 325 MG PO TABS
650.0000 mg | ORAL_TABLET | ORAL | Status: DC | PRN
  Filled 2014-01-04: qty 2

## 2014-01-04 MED ORDER — LORAZEPAM 2 MG/ML IJ SOLN: 0.0000 mg | Freq: Four times a day (QID) | INTRAMUSCULAR | Status: DC

## 2014-01-04 MED ORDER — ONDANSETRON HCL 4 MG PO TABS: 4.0000 mg | ORAL_TABLET | Freq: Three times a day (TID) | ORAL | Status: DC | PRN

## 2014-01-04 MED ORDER — ALUM & MAG HYDROXIDE-SIMETH 200-200-20 MG/5ML PO SUSP: 30.0000 mL | ORAL | Status: DC | PRN

## 2014-01-04 MED ORDER — LORAZEPAM 1 MG PO TABS: 1.0000 mg | ORAL_TABLET | Freq: Three times a day (TID) | ORAL | Status: DC | PRN

## 2014-01-04 MED ORDER — DIVALPROEX SODIUM ER 500 MG PO TB24
500.0000 mg | ORAL_TABLET | Freq: Two times a day (BID) | ORAL | Status: DC
  Administered 2014-01-05 (×2): 500 mg via ORAL
  Filled 2014-01-04 (×4): qty 1

## 2014-01-04 MED ORDER — ZOLPIDEM TARTRATE 5 MG PO TABS: 5.0000 mg | ORAL_TABLET | Freq: Every evening | ORAL | Status: DC | PRN

## 2014-01-04 MED ORDER — RISPERIDONE 2 MG PO TABS
2.0000 mg | ORAL_TABLET | Freq: Every day | ORAL | Status: DC
  Administered 2014-01-05: 2 mg via ORAL
  Filled 2014-01-04: qty 1

## 2014-01-04 MED ORDER — IBUPROFEN 200 MG PO TABS
600.0000 mg | ORAL_TABLET | Freq: Three times a day (TID) | ORAL | Status: DC | PRN
  Administered 2014-01-05: 600 mg via ORAL
  Filled 2014-01-04: qty 3

## 2014-01-04 MED ORDER — LORAZEPAM 2 MG/ML IJ SOLN
0.0000 mg | Freq: Two times a day (BID) | INTRAMUSCULAR | Status: DC
  Administered 2014-01-04: 1 mg via INTRAVENOUS
  Filled 2014-01-04: qty 1

## 2014-01-04 MED ORDER — BENZTROPINE MESYLATE 1 MG PO TABS
0.5000 mg | ORAL_TABLET | Freq: Two times a day (BID) | ORAL | Status: DC
  Administered 2014-01-05 (×2): 0.5 mg via ORAL
  Filled 2014-01-04 (×2): qty 1

## 2014-01-04 MED ORDER — SODIUM CHLORIDE 0.9 % IV BOLUS (SEPSIS)
1000.0000 mL | Freq: Once | INTRAVENOUS | Status: AC
  Administered 2014-01-04: 1000 mL via INTRAVENOUS

## 2014-01-04 MED ORDER — VITAMIN B-1 100 MG PO TABS
100.0000 mg | ORAL_TABLET | Freq: Every day | ORAL | Status: DC
  Administered 2014-01-04 – 2014-01-05 (×2): 100 mg via ORAL
  Filled 2014-01-04 (×2): qty 1

## 2014-01-04 MED ORDER — LORAZEPAM 1 MG PO TABS: 0.0000 mg | ORAL_TABLET | Freq: Four times a day (QID) | ORAL | Status: DC

## 2014-01-04 NOTE — BH Assessment (Signed)
Tele Assessment Note   Mary LefortDebra [name] is an 53 y.o. female [facility] after a suicide attempt today. Pt stated "I tried to overdose on some pills". Pt did not share any precipitating factors with this Clinical research associatewriter; however it has been documented that pt is unhappy with her current living situation in a group home.  Pt reported that she tried to overdose on aspirin, Depakote and Risperdal. PT also reported that she has attempted suicide in the past and has been hospitalized as well. Pt is currently receiving mental health services through [facility]. Pt did not report any HI or AVH at this time. Pt did not report any alcohol or illicit substance abuse but her UDS is positive for cocaine. Pt denied having access to weapons and did not report any pending criminal charges or upcoming court dates.  Pt is drowsy but oriented x3. Pt maintained poor eye contact throughout this assessment often dosing off between questioning. Pt speech is normal at times and other times incoherent. Pt thought process is relevant. Pt judgment and insight is poor. Pt is unable to reliably contract for safety at this time. Inpatient treatment has been recommended.    Axis I: Depressive Disorder NOS  Past Medical History:  Past Medical History  Diagnosis Date  . Arthritis   . Seizures   . Psychiatric illness   . Bipolar 1 disorder   . Schizo-affective schizophrenia   . Obesity   . Substance abuse     Past Surgical History  Procedure Laterality Date  . Stomach surgery      Family History:  Family History  Problem Relation Age of Onset  . Alzheimer's disease Mother   . Depression Mother     Social History:  reports that she has been smoking Cigarettes.  She has been smoking about 1.00 pack per day. She has never used smokeless tobacco. She reports that she drinks about 3.6 ounces of alcohol per week. She reports that she uses illicit drugs ("Crack" cocaine and Cocaine) about 5 times per week.  Additional  Social History:  Alcohol / Drug Use History of alcohol / drug use?:  (Pt denies any alcohol or illicit substance use; however UDS is positive for cocaine. )  CIWA: CIWA-Ar BP: 126/75 mmHg Pulse Rate: 95 Nausea and Vomiting: no nausea and no vomiting Tactile Disturbances: none Tremor: no tremor Auditory Disturbances: not present Paroxysmal Sweats: no sweat visible Visual Disturbances: not present Anxiety: three Headache, Fullness in Head: mild Agitation: normal activity Orientation and Clouding of Sensorium: oriented and can do serial additions CIWA-Ar Total: 5 COWS:    PATIENT STRENGTHS: (choose at least two) Average or above average intelligence Supportive family/friends  Allergies: No Known Allergies  Home Medications:  (Not in a hospital admission)  OB/GYN Status:  Patient's last menstrual period was 01/13/2012.  General Assessment Data Location of Assessment: [facility] ACT Assessment: Yes Is this a Tele or Face-to-Face Assessment?: Face-to-Face Is this an Initial Assessment or a Re-assessment for this encounter?: Initial Assessment Living Arrangements: Other (Comment) (Group Home ) Can pt return to current living arrangement?: Yes Admission Status: Involuntary Is patient capable of signing voluntary admission?: Yes Transfer from: Group Home Referral Source: Self/Family/Friend     [facility] Living Arrangements: Other (Comment) (Group Home ) Name of Psychiatrist: [facility]ist[facility]  Education Status Is patient currently in school?: No  Risk to self with the past 6 months Suicidal Ideation: Yes-Currently Present  Suicidal Intent: Yes-Currently Present Is patient at risk for suicide?: Yes Suicidal Plan?: Yes-Currently Present Specify Current Suicidal Plan: Overdose on pills Access to Means: Yes Specify Access to Suicidal Means: Pt had access to pills What has been your use of drugs/alcohol within the last 12 months?:  Pt denies alcohol/drug use; however UDS is positve for cocaine.  Previous Attempts/Gestures: Yes How many times?: 2 Other Self Harm Risks: No other self harm risk identified at this time.  Triggers for Past Attempts: Unpredictable Intentional Self Injurious Behavior: None Family Suicide History: No Recent stressful life event(s):  (No stressful events reported at this time. ) Persecutory voices/beliefs?: No Depression: Yes Depression Symptoms: Despondent Substance abuse history and/or treatment for substance abuse?: Yes Suicide prevention information given to non-admitted patients: Not applicable  Risk to Others within the past 6 months Homicidal Ideation: No Thoughts of Harm to Others: No Current Homicidal Intent: No Current Homicidal Plan: No Access to Homicidal Means: No Identified Victim: N/A History of harm to others?: No Assessment of Violence: None Noted Violent Behavior Description: No violent behaviors observed at this time.  Does patient have access to weapons?: No Criminal Charges Pending?: No Does patient have a court date: No  Psychosis Hallucinations: None noted Delusions: None noted  Mental Status Report Appear/Hygiene: In scrubs Eye Contact: Poor Motor Activity: Freedom of movement Speech: Incoherent Level of Consciousness: Sleeping Mood: Depressed Affect: Depressed Anxiety Level: None Thought Processes: Coherent;Relevant Judgement: Partial Orientation: Person;Place;Time Obsessive Compulsive Thoughts/Behaviors: None  Cognitive Functioning Concentration: Fair Memory: Unable to Assess IQ: Average Insight: Unable to Assess Impulse Control: Unable to Assess Appetite: Good Weight Loss: 0 Weight Gain: 0 Sleep: Unable to Assess Vegetative Symptoms: Unable to Assess  ADLScreening [facility]) Patient's cognitive ability adequate to safely complete daily activities?: Yes Patient able to express need for assistance with ADLs?:  Yes Independently performs ADLs?: Yes (appropriate for developmental age)  Prior Inpatient Therapy Prior Inpatient Therapy: Yes Prior Therapy Dates: 2013,2013 [facility] Reason for Treatment: Substance Abuse  Prior Outpatient Therapy Prior Outpatient Therapy: Yes Prior Therapy Facilty/Provider(s): [facility]  ADL Screening (condition at time of admission) Patient's cognitive ability adequate to safely complete daily activities?: Yes Patient able to express need for assistance with ADLs?: Yes Independently performs ADLs?: Yes (appropriate for developmental age)       Abuse/Neglect Assessment (Assessment to be complete while patient is alone) Physical Abuse: Denies Verbal Abuse: Denies Sexual Abuse: Denies Exploitation of patient/patient's resources: Denies Self-Neglect: Denies Values / Beliefs Cultural Requests During Hospitalization: None Spiritual Requests During Hospitalization: None   Advance Directives (For Healthcare) Does patient have an advance directive?: No Would patient like information on creating an advanced directive?: No - patient declined information    Additional Information 1:1 In Past 12 Months?: No CIRT Risk: No Elopement Risk: No Does patient have medical clearance?: No     Disposition:  Disposition Initial Assessment Completed for this Encounter: Yes Disposition of Patient: Inpatient treatment program Type of inpatient treatment program: Adult  Eutimio Gharibian S 01/04/2014 11:46 PM

## 2014-01-04 NOTE — ED Notes (Signed)
Pt belongings in 1 patient belonging bag at nurse's desk in front of room #16.  Pt has blue jeans, black coat, shoes and shirt.

## 2014-01-04 NOTE — ED Notes (Signed)
She states she took several aspirin tablets; plus some Depakote and Risperdal; then drank two 40-ounce beers "just a little while ago".

## 2014-01-04 NOTE — ED Provider Notes (Signed)
CSN: [id number]     Arrival date & time 01/04/14  1616 History   First MD Initiated Contact with Patient 01/04/14 1637     Chief Complaint  Patient presents with  . Ingestion     (Consider location/radiation/quality/duration/timing/severity/associated sxs/prior Treatment) Patient is a 53 y.o. female presenting with Ingested Medication. The history is provided by the patient.  Ingestion This is a new problem. The current episode started 1 to 2 hours ago. Episode frequency: once. The problem has not changed since onset.Pertinent negatives include no shortness of breath. Nothing aggravates the symptoms. Nothing relieves the symptoms.    Past Medical History  Diagnosis Date  . Arthritis   . Seizures   . Psychiatric illness   . Bipolar 1 disorder   . Schizo-affective schizophrenia   . Obesity   . Substance abuse    Past Surgical History  Procedure Laterality Date  . Stomach surgery     Family History  Problem Relation Age of Onset  . Alzheimer's disease Mother   . Depression Mother    History  Substance Use Topics  . Smoking status: Current Every Day Smoker -- 1.00 packs/day    Types: Cigarettes  . Smokeless tobacco: Never Used  . Alcohol Use: 3.6 oz/week    6 Cans of beer per week   OB History   Grav Para Term Preterm Abortions TAB SAB Ect Mult Living                 Review of Systems  Constitutional: Negative for fever and chills.  Respiratory: Negative for cough and shortness of breath.   All other systems reviewed and are negative.     Allergies  Review of patient's allergies indicates no known allergies.  Home Medications   Prior to Admission medications   Medication Sig Start Date End Date Taking? Authorizing Provider  benztropine (COGENTIN) 0.5 MG tablet Take 0.5 mg by mouth 2 (two) times daily.    Historical Provider, MD  divalproex (DEPAKOTE ER) 500 MG 24 hr tablet Take 1 tablet (500 mg total) by mouth 2 (two) times daily. 04/02/13   Zannie CovePreetha Joseph,  MD  risperiDONE (RISPERDAL) 2 MG tablet Take 2 mg by mouth at bedtime.    Historical Provider, MD  risperiDONE microspheres (RISPERDAL CONSTA) 50 MG injection Inject 50 mg into the muscle every 14 (fourteen) days.    Historical Provider, MD   BP 135/79  Pulse 104  Temp(Src) 98 F (36.7 C) (Oral)  Resp 16  SpO2 97%  LMP 01/13/2012 Physical Exam  Nursing note and vitals reviewed. Constitutional: She is oriented to person, place, and time. She appears well-developed and well-nourished. No distress.  HENT:  Head: Normocephalic and atraumatic.  Mouth/Throat: Oropharynx is clear and moist.  Eyes: EOM are normal. Pupils are equal, round, and reactive to light.  Neck: Normal range of motion. Neck supple.  Cardiovascular: Normal rate and regular rhythm.  Exam reveals no friction rub.   No murmur heard. Pulmonary/Chest: Effort normal and breath sounds normal. No respiratory distress. She has no wheezes. She has no rales.  Abdominal: Soft. She exhibits no distension. There is no tenderness. There is no rebound.  Musculoskeletal: Normal range of motion. She exhibits no edema.  Neurological: She is alert and oriented to person, place, and time.  Skin: No rash noted. She is not diaphoretic.    ED Course  Procedures (including critical care time) Labs Review Labs Reviewed  CBC  COMPREHENSIVE METABOLIC PANEL  ACETAMINOPHEN LEVEL  SALICYLATE  LEVEL  ETHANOL  URINE RAPID DRUG SCREEN (HOSP PERFORMED)  URINALYSIS, ROUTINE W REFLEX MICROSCOPIC    Imaging Review No results found.   EKG Interpretation   Date/Time:  Saturday January 04 2014 16:47:43 EDT Ventricular Rate:  96 PR Interval:  115 QRS Duration: 72 QT Interval:  330 QTC Calculation: 417 R Axis:   68 Text Interpretation:  Sinus rhythm Borderline short PR interval Right  atrial enlargement Borderline repolarization abnormality Similar to prior  Confirmed by Gwendolyn GrantWALDEN  MD, Guenevere Roorda ([id number]) on 01/04/2014 5:19:34 PM      MDM    Final diagnoses:  Drug overdose, intentional self-harm, initial encounter    77F presents after an overdose. Took about 10 aspirin tablets and an unknown amount of depakote and one risperdal tablet. She also had 2 40-oz beers. She was trying to hurt herself. Unhappy with her group home situation.  Patient also says she wants to get off alcohol.  IVC placed by me since she tried to commit suicide. Will check labs, check aspirin level.   Serial aspirin levels trending down. Patient is stable for TTS evaluation.   TTS stated she needs inpatient treatment. Psych orders placed.  Elwin MochaBlair Karliah Kowalchuk, MD 01/04/14 2322

## 2014-01-04 NOTE — BH Assessment (Signed)
Unable to complete assessment at this time. Pt is lethargic. Will assess when pt is alert.

## 2014-01-04 NOTE — ED Notes (Signed)
RN attempted IV access and blood draw. Unsuccessful x1 attempt. Pt refusing to be stuck again.  States, "I need to eat before I can be stuck again".   Ailene RavelMade [facility] EDP aware.  Instructed me to put ultrasound in room and he would use to get blood draw.

## 2014-01-04 NOTE — ED Notes (Signed)
Attempted IV  x2 , once each in L wrist and R AC.  Unable to draw with butterfly on R hand. Pt tolerated well.

## 2014-01-04 NOTE — ED Notes (Addendum)
Reported by [employer] pt took 10+  325mg  ASA, Respirdal 2mg , 2- 40 oz beers, pt states she "I'm hurting all up through here (rubbing back and buttocks) from the front alt the way to my butt hole." "I get bored and we drink" pt voices she is S/I with a plan at times to take an overdose.

## 2014-01-04 NOTE — ED Notes (Signed)
She is taken directly to one of our resus. Rooms.

## 2014-01-04 NOTE — BH Assessment (Signed)
Assessment completed. Consulted with Mary Mornharles Kober, PA-C who recommended inpatient treatment. Dr. Gwendolyn GrantWalden has been informed of the recommendation. [facility] at capacity. TTS will contact other facilities for placement.

## 2014-01-04 NOTE — BH Assessment (Signed)
Received call for assessment. Spoke with Dr. Elwin MochaBlair [facility] who said Pt lives in a group home and overdosed on medication in suicide attempt. She has been placed under IVC. Tele-assessment will be initiated.  Harlin RainFord Ellis Ria CommentWarrick Jr, LPC, [facility] Triage Specialist [phone]

## 2014-01-05 ENCOUNTER — Encounter (HOSPITAL_COMMUNITY): Payer: Self-pay | Admitting: Registered Nurse

## 2014-01-05 DIAGNOSIS — T39012A Poisoning by aspirin, intentional self-harm, initial encounter: Secondary | ICD-10-CM | POA: Diagnosis not present

## 2014-01-05 DIAGNOSIS — F1994 Other psychoactive substance use, unspecified with psychoactive substance-induced mood disorder: Secondary | ICD-10-CM

## 2014-01-05 NOTE — Consult Note (Signed)
[facility] Face-to-Face Psychiatry Consult   Reason for Consult:  Suicidal ideation Referring Physician:  EDP  [name] is an 53 y.o. female. Total Time spent with patient: 45 minutes  Assessment: AXIS I:  Substance Abuse and Substance Induced Mood Disorder AXIS II:  Deferred AXIS III:   Past Medical History  Diagnosis Date  . Arthritis   . Seizures   . Psychiatric illness   . Bipolar 1 disorder   . Schizo-affective schizophrenia   . Obesity   . Substance abuse    AXIS IV:  other psychosocial or environmental problems AXIS V:  61-70 mild symptoms  Plan:  No evidence of imminent risk to self or others at present.   Patient does not meet criteria for psychiatric inpatient admission. Supportive therapy provided about ongoing stressors. Discussed crisis plan, support from social network, calling 911, coming to the Emergency Department, and calling Suicide Hotline.  Subjective:   HPI:  [name] is a 53 y.o. female patient presented to [facility] with complaints of alcohol abuse and overdose on medication with intent to commit suicide.  Patient states that she has not been taking her medications because she has been drinking alcohol.  Patient admits to cocaine use also. Patient denies suicidal ideation, homicidal ideation, auditory/visual hallucinations, and paranoia.  Patient states that she has a son that is supportive.  Patient also state "I use cocaine but I ain't no crack head no more; I ain't on the streets.  Patient has outpatient services with [facility].  Patient states that she is aware that she doesn't need to use drugs and drink alcohol.  "I just make stupid mistakes.  I need to try harder.  "I was out with some friends partying.  The police brought me in."  Patient denies that she was trying to kill herself with Asprin and alcohol.    HPI Elements:   Location:  Polysubstance abuse. Quality:  substance induced mood disorder. Severity:  substance induced mood  disorder. Timing:  1 day. Review of Systems  Psychiatric/Behavioral: Positive for substance abuse. Negative for depression, suicidal ideas, hallucinations and memory loss. The patient is not nervous/anxious and does not have insomnia.   All other systems reviewed and are negative.  Family History  Problem Relation Age of Onset  . Alzheimer's disease Mother   . Depression Mother     Past Psychiatric History: Past Medical History  Diagnosis Date  . Arthritis   . Seizures   . Psychiatric illness   . Bipolar 1 disorder   . Schizo-affective schizophrenia   . Obesity   . Substance abuse     reports that she has been smoking Cigarettes.  She has been smoking about 1.00 pack per day. She has never used smokeless tobacco. She reports that she drinks about 3.6 ounces of alcohol per week. She reports that she uses illicit drugs ("Crack" cocaine and Cocaine) about 5 times per week. Family History  Problem Relation Age of Onset  . Alzheimer's disease Mother   . Depression Mother    Family History Family Supports: Yes, List: (Son) Living Arrangements: Other (Comment) (Group Home ) Can pt return to current living arrangement?: Yes Abuse/Neglect [facility]) Physical Abuse: Denies Verbal Abuse: Denies Sexual Abuse: Denies Allergies:  No Known Allergies  ACT Assessment Complete:  Yes:    Educational Status    Risk to Self: Risk to self with the past 6 months Suicidal Ideation: Yes-Currently Present Suicidal Intent: Yes-Currently Present Is patient at risk for suicide?: Yes  Suicidal Plan?: Yes-Currently Present Specify Current Suicidal Plan: Overdose on pills Access to Means: Yes Specify Access to Suicidal Means: Pt had access to pills What has been your use of drugs/alcohol within the last 12 months?: Pt denies alcohol/drug use; however UDS is positve for cocaine.  Previous Attempts/Gestures: Yes How many times?: 2 Other Self Harm Risks: No other self harm risk identified at this time.   Triggers for Past Attempts: Unpredictable Intentional Self Injurious Behavior: None Family Suicide History: No Recent stressful life event(s):  (No stressful events reported at this time. ) Persecutory voices/beliefs?: No Depression: Yes Depression Symptoms: Despondent Substance abuse history and/or treatment for substance abuse?: Yes Suicide prevention information given to non-admitted patients: Not applicable  Risk to Others: Risk to Others within the past 6 months Homicidal Ideation: No Thoughts of Harm to Others: No Current Homicidal Intent: No Current Homicidal Plan: No Access to Homicidal Means: No Identified Victim: N/A History of harm to others?: No Assessment of Violence: None Noted Violent Behavior Description: No violent behaviors observed at this time.  Does patient have access to weapons?: No Criminal Charges Pending?: No Does patient have a court date: No  Abuse: Abuse/Neglect Assessment (Assessment to be complete while patient is alone) Physical Abuse: Denies Verbal Abuse: Denies Sexual Abuse: Denies Exploitation of patient/patient's resources: Denies Self-Neglect: Denies  Prior Inpatient Therapy: Prior Inpatient Therapy Prior Inpatient Therapy: Yes Prior Therapy Dates: 2013,2013 Prior Therapy Facilty/Provider(s): [facility] Reason for Treatment: Substance Abuse  Prior Outpatient Therapy: Prior Outpatient Therapy Prior Outpatient Therapy: Yes Prior Therapy Facilty/Provider(s): [facility]  Additional Information: Additional Information 1:1 In Past 12 Months?: No CIRT Risk: No Elopement Risk: No Does patient have medical clearance?: No                  Objective: Blood pressure 103/96, pulse 82, temperature 98.6 F (37 C), temperature source Oral, resp. rate 18, last menstrual period 01/13/2012, SpO2 100.00%.There is no weight on file to calculate BMI. Results for orders placed during the hospital encounter of 01/04/14 (from the past 72  hour(s))  URINE RAPID DRUG SCREEN (HOSP PERFORMED)     Status: Abnormal   Collection Time    01/04/14  5:37 PM      Result Value Ref Range   Opiates NONE DETECTED  NONE DETECTED   Cocaine POSITIVE (*) NONE DETECTED   Benzodiazepines NONE DETECTED  NONE DETECTED   Amphetamines NONE DETECTED  NONE DETECTED   Tetrahydrocannabinol NONE DETECTED  NONE DETECTED   Barbiturates NONE DETECTED  NONE DETECTED   Comment:            DRUG SCREEN FOR MEDICAL PURPOSES     ONLY.  IF CONFIRMATION IS NEEDED     FOR ANY PURPOSE, NOTIFY LAB     WITHIN 5 DAYS.                LOWEST DETECTABLE LIMITS     FOR URINE DRUG SCREEN     Drug Class       Cutoff (ng/mL)     Amphetamine      1000     Barbiturate      200     Benzodiazepine   989     Tricyclics       211     Opiates          300     Cocaine          300     THC  55  URINALYSIS, ROUTINE W REFLEX MICROSCOPIC     Status: Abnormal   Collection Time    01/04/14  5:37 PM      Result Value Ref Range   Color, Urine YELLOW  YELLOW   APPearance CLOUDY (*) CLEAR   Specific Gravity, Urine 1.022  1.005 - 1.030   pH 5.5  5.0 - 8.0   Glucose, UA NEGATIVE  NEGATIVE mg/dL   Hgb urine dipstick NEGATIVE  NEGATIVE   Bilirubin Urine NEGATIVE  NEGATIVE   Ketones, ur NEGATIVE  NEGATIVE mg/dL   Protein, ur NEGATIVE  NEGATIVE mg/dL   Urobilinogen, UA 0.2  0.0 - 1.0 mg/dL   Nitrite NEGATIVE  NEGATIVE   Leukocytes, UA SMALL (*) NEGATIVE  URINE MICROSCOPIC-ADD ON     Status: None   Collection Time    01/04/14  5:37 PM      Result Value Ref Range   Squamous Epithelial / LPF RARE  RARE   WBC, UA 3-6  <3 WBC/hpf   Bacteria, UA RARE  RARE  CBC     Status: None   Collection Time    01/04/14  6:20 PM      Result Value Ref Range   WBC 6.9  4.0 - 10.5 K/uL   RBC 4.13  3.87 - 5.11 MIL/uL   Hemoglobin 12.1  12.0 - 15.0 g/dL   HCT 36.3  36.0 - 46.0 %   MCV 87.9  78.0 - 100.0 fL   MCH 29.3  26.0 - 34.0 pg   MCHC 33.3  30.0 - 36.0 g/dL   RDW 14.6   11.5 - 15.5 %   Platelets 225  150 - 400 K/uL  COMPREHENSIVE METABOLIC PANEL     Status: Abnormal   Collection Time    01/04/14  6:20 PM      Result Value Ref Range   Sodium 141  137 - 147 mEq/L   Potassium 4.0  3.7 - 5.3 mEq/L   Chloride 102  96 - 112 mEq/L   CO2 27  19 - 32 mEq/L   Glucose, Bld 102 (*) 70 - 99 mg/dL   BUN 12  6 - 23 mg/dL   Creatinine, Ser 0.71  0.50 - 1.10 mg/dL   Calcium 8.8  8.4 - 10.5 mg/dL   Total Protein 6.8  6.0 - 8.3 g/dL   Albumin 3.2 (*) 3.5 - 5.2 g/dL   AST 15  0 - 37 U/L   ALT 24  0 - 35 U/L   Alkaline Phosphatase 55  39 - 117 U/L   Total Bilirubin <0.2 (*) 0.3 - 1.2 mg/dL   GFR calc non Af Amer >90  >90 mL/min   GFR calc Af Amer >90  >90 mL/min   Comment: (NOTE)     The eGFR has been calculated using the CKD EPI equation.     This calculation has not been validated in all clinical situations.     eGFR's persistently <90 mL/min signify possible Chronic Kidney     Disease.   Anion gap 12  5 - 15  ACETAMINOPHEN LEVEL     Status: None   Collection Time    01/04/14  6:20 PM      Result Value Ref Range   Acetaminophen (Tylenol), Serum <15.0  10 - 30 ug/mL   Comment:            THERAPEUTIC CONCENTRATIONS VARY     SIGNIFICANTLY. A RANGE OF 10-30  ug/mL MAY BE AN EFFECTIVE     CONCENTRATION FOR MANY PATIENTS.     HOWEVER, SOME ARE BEST TREATED     AT CONCENTRATIONS OUTSIDE THIS     RANGE.     ACETAMINOPHEN CONCENTRATIONS     >150 ug/mL AT 4 HOURS AFTER     INGESTION AND >50 ug/mL AT 12     HOURS AFTER INGESTION ARE     OFTEN ASSOCIATED WITH TOXIC     REACTIONS.  ETHANOL     Status: None   Collection Time    01/04/14  6:20 PM      Result Value Ref Range   Alcohol, Ethyl (B) <11  0 - 11 mg/dL   Comment:            LOWEST DETECTABLE LIMIT FOR     SERUM ALCOHOL IS 11 mg/dL     FOR MEDICAL PURPOSES ONLY  VALPROIC ACID LEVEL     Status: Abnormal   Collection Time    01/04/14  6:20 PM      Result Value Ref Range   Valproic Acid Lvl 47.3  (*) 50.0 - 100.0 ug/mL   Comment: Performed at [facility]     Status: None   Collection Time    01/04/14  6:20 PM      Result Value Ref Range   Salicylate Lvl 2.8  2.8 - 54.0 mg/dL  SALICYLATE LEVEL     Status: Abnormal   Collection Time    01/04/14  9:58 PM      Result Value Ref Range   Salicylate Lvl <9.8 (*) 2.8 - 20.0 mg/dL   Labs are reviewed see abnormal values above.  Medications reviewed and no changes made.   Current Facility-Administered Medications  Medication Dose Route Frequency Provider Last Rate Last Dose  . acetaminophen (TYLENOL) tablet 650 mg  650 mg Oral Q4H PRN Evelina Bucy, MD      . alum & mag hydroxide-simeth (MAALOX/MYLANTA) 200-200-20 MG/5ML suspension 30 mL  30 mL Oral PRN Evelina Bucy, MD      . benztropine (COGENTIN) tablet 0.5 mg  0.5 mg Oral BID Evelina Bucy, MD   0.5 mg at 01/05/14 0942  . divalproex (DEPAKOTE ER) 24 hr tablet 500 mg  500 mg Oral BID Evelina Bucy, MD   500 mg at 01/05/14 0943  . ibuprofen (ADVIL,MOTRIN) tablet 600 mg  600 mg Oral Q8H PRN Evelina Bucy, MD   600 mg at 01/05/14 0608  . LORazepam (ATIVAN) injection 0-4 mg  0-4 mg Intravenous 4 times per day Evelina Bucy, MD      . LORazepam (ATIVAN) injection 0-4 mg  0-4 mg Intravenous Q12H Evelina Bucy, MD   1 mg at 01/04/14 1841  . LORazepam (ATIVAN) tablet 0-4 mg  0-4 mg Oral 4 times per day Evelina Bucy, MD      . LORazepam (ATIVAN) tablet 0-4 mg  0-4 mg Oral Q12H Evelina Bucy, MD      . LORazepam (ATIVAN) tablet 1 mg  1 mg Oral Q8H PRN Evelina Bucy, MD      . nicotine (NICODERM CQ - dosed in mg/24 hours) patch 21 mg  21 mg Transdermal Daily Evelina Bucy, MD      . ondansetron [facility]) tablet 4 mg  4 mg Oral Q8H PRN Evelina Bucy, MD      . risperiDONE (RISPERDAL) tablet 2 mg  2 mg Oral QHS Evelina Bucy, MD   2 mg at 01/05/14  0147  . thiamine (B-1) injection 100 mg  100 mg Intravenous Daily Evelina Bucy, MD      . thiamine (VITAMIN B-1) tablet 100 mg  100 mg Oral  Daily Evelina Bucy, MD   100 mg at 01/05/14 0942  . zolpidem (AMBIEN) tablet 5 mg  5 mg Oral QHS PRN Evelina Bucy, MD       Current Outpatient Prescriptions  Medication Sig Dispense Refill  . benztropine (COGENTIN) 0.5 MG tablet Take 0.5 mg by mouth 2 (two) times daily.      . divalproex (DEPAKOTE ER) 500 MG 24 hr tablet Take 1 tablet (500 mg total) by mouth 2 (two) times daily.      . risperiDONE (RISPERDAL) 2 MG tablet Take 2 mg by mouth at bedtime.      . risperiDONE microspheres (RISPERDAL CONSTA) 50 MG injection Inject 50 mg into the muscle every 14 (fourteen) days.        Psychiatric Specialty Exam:     Blood pressure 103/96, pulse 82, temperature 98.6 F (37 C), temperature source Oral, resp. rate 18, last menstrual period 01/13/2012, SpO2 100.00%.There is no weight on file to calculate BMI.  General Appearance: Casual  Eye Contact::  Good  Speech:  Clear and Coherent  Volume:  Normal  Mood:  "I feel better"  Affect:  Congruent  Thought Process:  Circumstantial and Goal Directed  Orientation:  Full (Time, Place, and Person)  Thought Content:  Rumination  Suicidal Thoughts:  No  Homicidal Thoughts:  No  Memory:  Immediate;   Good Recent;   Good Remote;   Fair  Judgement:  Fair  Insight:  Fair  Psychomotor Activity:  Normal  Concentration:  Fair  Recall:  Good  Fund of Knowledge:Good  Language: Good  Akathisia:  No  Handed:  Right  AIMS (if indicated):     Assets:  Communication Skills Desire for Improvement Physical Health Resilience Social Support  Sleep:      Musculoskeletal: Strength & Muscle Tone: within normal limits Gait & Station: normal Patient leans: N/A  Treatment Plan Summary: Discharge home.  Patient to follow up with [facility]  Earleen Newport, FNP-BC 01/05/2014 2:02 PM  I have personally seen the patient and agreed with the findings and involved in the treatment plan. Berniece Andreas, MD

## 2014-01-05 NOTE — ED Notes (Signed)
Patient admitted into the care of the group home and  Act team. Patient denied homicidal and suicidal thoughts. Patient denied hallucinations. Patient given valuables.

## 2014-01-05 NOTE — ED Notes (Signed)
Patient came in requesting for something to eat. Nourishment offered. Denied  pain, SI, AH/VH. Patient refused to answer any further question stated "Leave me alone please" patient encouraged to verbalize need to staff. Will continue to monitor patient.

## 2014-01-05 NOTE — Discharge Instructions (Signed)
Alcohol Use Disorder °Alcohol use disorder is a mental disorder. It is not a one-time incident of heavy drinking. Alcohol use disorder is the excessive and uncontrollable use of alcohol over time that leads to problems with functioning in one or more areas of daily living. People with this disorder risk harming themselves and others when they drink to excess. Alcohol use disorder also can cause other mental disorders, such as mood and anxiety disorders, and serious physical problems. People with alcohol use disorder often misuse other drugs.  °Alcohol use disorder is common and widespread. Some people with this disorder drink alcohol to cope with or escape from negative life events. Others drink to relieve chronic pain or symptoms of mental illness. People with a family history of alcohol use disorder are at higher risk of losing control and using alcohol to excess.  °SYMPTOMS  °Signs and symptoms of alcohol use disorder may include the following:  °· Consumption of alcohol in larger amounts or over a longer period of time than intended. °· Multiple unsuccessful attempts to cut down or control alcohol use.   °· A great Lumm of time spent obtaining alcohol, using alcohol, or recovering from the effects of alcohol (hangover). °· A strong desire or urge to use alcohol (cravings).   °· Continued use of alcohol despite problems at work, school, or home because of alcohol use.   °· Continued use of alcohol despite problems in relationships because of alcohol use. °· Continued use of alcohol in situations when it is physically hazardous, such as driving a car. °· Continued use of alcohol despite awareness of a physical or psychological problem that is likely related to alcohol use. Physical problems related to alcohol use can involve the brain, heart, liver, stomach, and intestines. Psychological problems related to alcohol use include intoxication, depression, anxiety, psychosis, delirium, and dementia.   °· The need for  increased amounts of alcohol to achieve the same desired effect, or a decreased effect from the consumption of the same amount of alcohol (tolerance). °· Withdrawal symptoms upon reducing or stopping alcohol use, or alcohol use to reduce or avoid withdrawal symptoms. Withdrawal symptoms include: °¨ Racing heart. °¨ Hand tremor. °¨ Difficulty sleeping. °¨ Nausea. °¨ Vomiting. °¨ Hallucinations. °¨ Restlessness. °¨ Seizures. °DIAGNOSIS °Alcohol use disorder is diagnosed through an assessment by your health care provider. Your health care provider may start by asking three or four questions to screen for excessive or problematic alcohol use. To confirm a diagnosis of alcohol use disorder, at least two symptoms must be present within a 12-month period. The severity of alcohol use disorder depends on the number of symptoms: °· Mild--two or three. °· Moderate--four or five. °· Severe--six or more. °Your health care provider may perform a physical exam or use results from lab tests to see if you have physical problems resulting from alcohol use. Your health care provider may refer you to a mental health professional for evaluation. °TREATMENT  °Some people with alcohol use disorder are able to reduce their alcohol use to low-risk levels. Some people with alcohol use disorder need to quit drinking alcohol. When necessary, mental health professionals with specialized training in substance use treatment can help. Your health care provider can help you decide how severe your alcohol use disorder is and what type of treatment you need. The following forms of treatment are available:  °· Detoxification. Detoxification involves the use of prescription medicines to prevent alcohol withdrawal symptoms in the first week after quitting. This is important for people with a history of symptoms   of withdrawal and for heavy drinkers who are likely to have withdrawal symptoms. Alcohol withdrawal can be dangerous and, in severe cases, cause  death. Detoxification is usually provided in a hospital or in-patient substance use treatment facility.  Counseling or talk therapy. Talk therapy is provided by substance use treatment counselors. It addresses the reasons people use alcohol and ways to keep them from drinking again. The goals of talk therapy are to help people with alcohol use disorder find healthy activities and ways to cope with life stress, to identify and avoid triggers for alcohol use, and to handle cravings, which can cause relapse.  Medicines.Different medicines can help treat alcohol use disorder through the following actions:  Decrease alcohol cravings.  Decrease the positive reward response felt from alcohol use.  Produce an uncomfortable physical reaction when alcohol is used (aversion therapy).  Support groups. Support groups are run by people who have quit drinking. They provide emotional support, advice, and guidance. These forms of treatment are often combined. Some people with alcohol use disorder benefit from intensive combination treatment provided by specialized substance use treatment centers. Both inpatient and outpatient treatment programs are available. Document Released: 04/07/2004 Document Revised: 07/15/2013 Document Reviewed: 06/07/2012 [facility] Patient Information 2015 [place], [place]. This information is not intended to replace advice given to you by your health care provider. Make sure you discuss any questions you have with your health care provider.  Drug Abuse and Addiction in Sports There are many types of drugs that one may become addicted to including illegal drugs (marijuana, cocaine, amphetamines, hallucinogens, and narcotics), prescription drugs (hydrocodone, codeine, and alprazolam), and other chemicals such as alcohol or nicotine. Two types of addiction exist: physical and emotional. Physical addiction usually occurs after prolonged use of a drug. However, some drugs may only take a couple  uses before addiction can occur. Physical addiction is marked by withdrawal symptoms in which the person experiences negative symptoms such as sweat, anxiety, tremors, hallucinations, or cravings in the absence of using the drug. Emotional dependence is the psychological desire for the "high" that the drugs produce when taken. SYMPTOMS   Inattentiveness.  Negligence.  Forgetfulness.  Insomnia.  Mood swings. RISK INCREASES WITH:   Family history of addiction.  Personal history of addictive personality. Studies have shown that risk takers, which many athletes are, have a higher risk of addiction. PREVENTION The only adequate prevention of drug abuse is abstinence from drugs. TREATMENT  The first step in quitting substance abuse is recognizing the problem and realizing that one has the power to change. Quitting requires a plan and support from others. It is often necessary to seek medical assistance. Caregivers are available to offer counseling, and for certain cases, medicine to diminish the physical symptoms of withdrawal. Many organizations exist such as Alcoholics Anonymous, Narcotics Anonymous, or the [employer] on Alcoholism that offer support for individuals who have chosen to quit their habits. Document Released: 02/28/2005 Document Revised: 07/15/2013 Document Reviewed: 06/12/2008 [facility] Patient Information 2015 [place], [place]. This information is not intended to replace advice given to you by your health care provider. Make sure you discuss any questions you have with your health care provider.  Polysubstance Abuse When people abuse more than one drug or type of drug it is called polysubstance or polydrug abuse. For example, many smokers also drink alcohol. This is one form of polydrug abuse. Polydrug abuse also refers to the use of a drug to counteract an unpleasant effect produced by another drug. It may also  be used to help with withdrawal from another drug. People who  take stimulants may become agitated. Sometimes this agitation is countered with a tranquilizer. This helps protect against the unpleasant side effects. Polydrug abuse also refers to the use of different drugs at the same time.  Anytime drug use is interfering with normal living activities, it has become abuse. This includes problems with family and friends. Psychological dependence has developed when your mind tells you that the drug is needed. This is usually followed by physical dependence which has developed when continuing increases of drug are required to get the same feeling or "high". This is known as addiction or chemical dependency. A person's risk is much higher if there is a history of chemical dependency in the family. SIGNS OF CHEMICAL DEPENDENCY  You have been told by friends or family that drugs have become a problem.  You fight when using drugs.  You are having blackouts (not remembering what you do while using).  You feel sick from using drugs but continue using.  You lie about use or amounts of drugs (chemicals) used.  You need chemicals to get you going.  You are suffering in work performance or in school because of drug use.  You get sick from use of drugs but continue to use anyway.  You need drugs to relate to people or feel comfortable in social situations.  You use drugs to forget problems. "Yes" answered to any of the above signs of chemical dependency indicates there are problems. The longer the use of drugs continues, the greater the problems will become. If there is a family history of drug or alcohol use, it is best not to experiment with these drugs. Continual use leads to tolerance. After tolerance develops more of the drug is needed to get the same feeling. This is followed by addiction. With addiction, drugs become the most important part of life. It becomes more important to take drugs than participate in the other usual activities of life. This includes  relating to friends and family. Addiction is followed by dependency. Dependency is a condition where drugs are now needed not just to get high, but to feel normal. Addiction cannot be cured but it can be stopped. This often requires outside help and the care of professionals. Treatment centers are listed in the yellow pages under: Cocaine, Narcotics, and Alcoholics Anonymous. Most hospitals and clinics can refer you to a specialized care center. Talk to your caregiver if you need help. Document Released: 10/20/2004 Document Revised: 05/23/2011 Document Reviewed: 02/28/2005 [facility] Patient Information 2015 [place], [place]. This information is not intended to replace advice given to you by your health care provider. Make sure you discuss any questions you have with your health care provider.

## 2014-01-05 NOTE — [facility] Suicide Risk Assessment (Cosign Needed)
Suicide Risk Assessment  Discharge Assessment     Demographic Factors:  Female  Total Time spent with patient: 30 minutes  Psychiatric Specialty Exam:     Blood pressure 103/96, pulse 82, temperature 98.6 F (37 C), temperature source Oral, resp. rate 18, last menstrual period 01/13/2012, SpO2 100.00%.There is no weight on file to calculate BMI.   General Appearance: Casual   Eye Contact:: Good   Speech: Clear and Coherent   Volume: Normal   Mood: "I feel better"   Affect: Congruent   Thought Process: Circumstantial and Goal Directed   Orientation: Full (Time, Place, and Person)   Thought Content: Rumination   Suicidal Thoughts: No   Homicidal Thoughts: No   Memory: Immediate; Good  Recent; Good  Remote; Fair   Judgement: Fair   Insight: Fair   Psychomotor Activity: Normal   Concentration: Fair   Recall: Good   Fund of Knowledge:Good   Language: Good   Akathisia: No   Handed: Right   AIMS (if indicated):   Assets: Communication Skills  Desire for Improvement  Physical Health  Resilience  Social Support   Sleep:   Musculoskeletal:  Strength & Muscle Tone: within normal limits  Gait & Station: normal  Patient leans: N/A   Mental Status Per Nursing Assessment::   On Admission:     Current Mental Status by Physician: Patient denies suicidal/homicidal ideation, psychosis, and paranoia  Loss Factors: NA  Historical Factors: NA  Risk Reduction Factors:   Sense of responsibility to family, Positive social support and Positive therapeutic relationship  Continued Clinical Symptoms:  Alcohol/Substance Abuse/Dependencies Previous Psychiatric Diagnoses and Treatments  Cognitive Features That Contribute To Risk:  Closed-mindedness    Suicide Risk:  Minimal: No identifiable suicidal ideation.  Patients presenting with no risk factors but with morbid ruminations; may be classified as minimal risk based on the severity of the depressive symptoms  Discharge  Diagnoses: AXIS I: Substance Abuse and Substance Induced Mood Disorder  AXIS II: Deferred  AXIS III:  Past Medical History   Diagnosis  Date   .  Arthritis    .  Seizures    .  Psychiatric illness    .  Bipolar 1 disorder    .  Schizo-affective schizophrenia    .  Obesity    .  Substance abuse     AXIS IV: other psychosocial or environmental problems  AXIS V: 61-70 mild symptoms      Plan Of Care/Follow-up recommendations:  Activity:  as tolerated Diet:  as tolerated  Is patient on multiple antipsychotic therapies at discharge:  No   Has Patient had three or more failed trials of antipsychotic monotherapy by history:  No  Recommended Plan for Multiple Antipsychotic Therapies: NA    Christy Ehrsam, FNP-BC 01/05/2014, 2:36 PM

## 2014-01-20 ENCOUNTER — Encounter (HOSPITAL_COMMUNITY): Payer: Self-pay

## 2014-01-20 ENCOUNTER — Emergency Department (HOSPITAL_COMMUNITY)
Admission: EM | Admit: 2014-01-20 | Discharge: 2014-01-20 | Disposition: A | Discharge status: Home / Self Care | Payer: Medicare Other | Attending: Emergency Medicine | Admitting: Emergency Medicine

## 2014-01-20 DIAGNOSIS — G40909 Epilepsy, unspecified, not intractable, without status epilepticus: Secondary | ICD-10-CM | POA: Diagnosis not present

## 2014-01-20 DIAGNOSIS — Z720 Tobacco use: Secondary | ICD-10-CM | POA: Insufficient documentation

## 2014-01-20 DIAGNOSIS — E669 Obesity, unspecified: Secondary | ICD-10-CM | POA: Insufficient documentation

## 2014-01-20 DIAGNOSIS — Z791 Long term (current) use of non-steroidal anti-inflammatories (NSAID): Secondary | ICD-10-CM | POA: Diagnosis not present

## 2014-01-20 DIAGNOSIS — F319 Bipolar disorder, unspecified: Secondary | ICD-10-CM | POA: Diagnosis not present

## 2014-01-20 DIAGNOSIS — F419 Anxiety disorder, unspecified: Secondary | ICD-10-CM | POA: Insufficient documentation

## 2014-01-20 DIAGNOSIS — F10129 Alcohol abuse with intoxication, unspecified: Secondary | ICD-10-CM | POA: Diagnosis present

## 2014-01-20 DIAGNOSIS — Z79899 Other long term (current) drug therapy: Secondary | ICD-10-CM | POA: Insufficient documentation

## 2014-01-20 DIAGNOSIS — F250 Schizoaffective disorder, bipolar type: Secondary | ICD-10-CM | POA: Diagnosis not present

## 2014-01-20 DIAGNOSIS — M1990 Unspecified osteoarthritis, unspecified site: Secondary | ICD-10-CM | POA: Diagnosis not present

## 2014-01-20 LAB — RAPID URINE DRUG SCREEN, HOSP PERFORMED
AMPHETAMINES: NOT DETECTED
Barbiturates: NOT DETECTED
Benzodiazepines: NOT DETECTED
Cocaine: NOT DETECTED
Opiates: NOT DETECTED
TETRAHYDROCANNABINOL: NOT DETECTED

## 2014-01-20 LAB — HEPATIC FUNCTION PANEL
ALBUMIN: 3.2 g/dL — AB (ref 3.5–5.2)
ALT: 22 U/L (ref 0–35)
AST: 14 U/L (ref 0–37)
Alkaline Phosphatase: 56 U/L (ref 39–117)
Bilirubin, Direct: <0.2 mg/dL (ref 0.0–0.3)
TOTAL PROTEIN: 6.6 g/dL (ref 6.0–8.3)
Total Bilirubin: <0.2 mg/dL — ABNORMAL LOW (ref 0.3–1.2)

## 2014-01-20 LAB — BASIC METABOLIC PANEL
ANION GAP: 12 (ref 5–15)
BUN: 11 mg/dL (ref 6–23)
CALCIUM: 9 mg/dL (ref 8.4–10.5)
CO2: 28 meq/L (ref 19–32)
CREATININE: 0.67 mg/dL (ref 0.50–1.10)
Chloride: 101 mEq/L (ref 96–112)
GFR calc Af Amer: >90 mL/min (ref >90)
Glucose, Bld: 150 mg/dL — ABNORMAL HIGH (ref 70–99)
Potassium: 4.1 mEq/L (ref 3.7–5.3)
Sodium: 141 mEq/L (ref 137–147)

## 2014-01-20 LAB — CBC WITH DIFFERENTIAL/PLATELET
Basophils Absolute: 0 10*3/uL (ref 0.0–0.1)
Basophils Relative: 1 % (ref 0–1)
Eosinophils Absolute: 0.1 10*3/uL (ref 0.0–0.7)
Eosinophils Relative: 1 % (ref 0–5)
HEMATOCRIT: 37.1 % (ref 36.0–46.0)
Hemoglobin: 12.1 g/dL (ref 12.0–15.0)
LYMPHS PCT: 44 % (ref 12–46)
Lymphs Abs: 2.6 10*3/uL (ref 0.7–4.0)
MCH: 28.5 pg (ref 26.0–34.0)
MCHC: 32.6 g/dL (ref 30.0–36.0)
MCV: 87.3 fL (ref 78.0–100.0)
MONO ABS: 0.4 10*3/uL (ref 0.1–1.0)
Monocytes Relative: 6 % (ref 3–12)
Neutro Abs: 2.8 10*3/uL (ref 1.7–7.7)
Neutrophils Relative %: 48 % (ref 43–77)
Platelets: 232 10*3/uL (ref 150–400)
RBC: 4.25 MIL/uL (ref 3.87–5.11)
RDW: 14.9 % (ref 11.5–15.5)
WBC: 6 10*3/uL (ref 4.0–10.5)

## 2014-01-20 LAB — VALPROIC ACID LEVEL: VALPROIC ACID LVL: 47.4 ug/mL — AB (ref 50.0–100.0)

## 2014-01-20 LAB — ETHANOL

## 2014-01-20 MED ORDER — RISPERIDONE 0.5 MG PO TABS
2.0000 mg | ORAL_TABLET | Freq: Every day | ORAL | Status: DC
  Filled 2014-01-20: qty 4

## 2014-01-20 MED ORDER — ONDANSETRON HCL 4 MG PO TABS: 4.0000 mg | ORAL_TABLET | Freq: Three times a day (TID) | ORAL | Status: DC | PRN

## 2014-01-20 MED ORDER — ACETAMINOPHEN 325 MG PO TABS
650.0000 mg | ORAL_TABLET | ORAL | Status: DC | PRN
  Administered 2014-01-20: 650 mg via ORAL
  Filled 2014-01-20: qty 2

## 2014-01-20 MED ORDER — DIVALPROEX SODIUM 250 MG PO DR TAB
500.0000 mg | DELAYED_RELEASE_TABLET | Freq: Two times a day (BID) | ORAL | Status: DC
Start: 2014-01-20 — End: 2014-01-21
  Filled 2014-01-20: qty 2

## 2014-01-20 MED ORDER — BENZTROPINE MESYLATE 1 MG/ML IJ SOLN
0.5000 mg | Freq: Two times a day (BID) | INTRAMUSCULAR | Status: DC
  Filled 2014-01-20: qty 2

## 2014-01-20 MED ORDER — LORAZEPAM 1 MG PO TABS: 1.0000 mg | ORAL_TABLET | Freq: Three times a day (TID) | ORAL | Status: DC | PRN

## 2014-01-20 NOTE — ED Notes (Signed)
Patient now checked back in at registration saying that she decided not to leave

## 2014-01-20 NOTE — ED Notes (Signed)
Psych. Monitor has been placed in Pod c23.

## 2014-01-20 NOTE — BH Assessment (Signed)
Relayed results of assessment to Donell SievertSpencer Simon, PA.Per Donell SievertSpencer Simon, PA [facility], pt does not meet inpt criteria and can be discharged back to her OP providers.   Informed Dr. Deretha EmoryZackowski of recommendations and he is in agreement.   Informed Rn of plan.   Spoke with Son Ethelene Brownsnthony who is guardian, [phone]. He reports he is aware pt was in ED, and that she had informed him she has ride back to her boarding house, he is in agreement with plan.   Clista BernhardtNancy Rashema Seawright, [facility] Triage Specialist 01/20/2014 10:16 PM
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
## 2014-01-20 NOTE — ED Provider Notes (Addendum)
CSN: [id number]     Arrival date & time 01/20/14  1339 History   First MD Initiated Contact with Patient 01/20/14 1601     Chief Complaint  Patient presents with  . Alcohol Intoxication  . Addiction Problem     (Consider location/radiation/quality/duration/timing/severity/associated sxs/prior Treatment) Patient is a 53 y.o. female presenting with intoxication. The history is provided by the patient and the police.  Alcohol Intoxication Pertinent negatives include no chest pain, no abdominal pain and no shortness of breath.  patient brought in by police. Please brought her in for alcohol detox. Patient admits to having an alcohol problem. The patient also has psychiatric disorder bipolar and schizoaffective. [facility] for behavioral health issues and suicidal ideation October 24. Was discharged on October 25 when the suicidal stuff resolved. Patient states that she is living in an unsafe environment. She needs help. Patient denies being suicidal or homicidal however she's very frustrated with her living arrangements. Patient has a power of attorney who is her son that lives in [place]. Patient may not be out to make solid decisions. [facility], but not able to tell me one last time and she followed up with them.  Past Medical History  Diagnosis Date  . Arthritis   . Seizures   . Psychiatric illness   . Bipolar 1 disorder   . Schizo-affective schizophrenia   . Obesity   . Substance abuse    Past Surgical History  Procedure Laterality Date  . Stomach surgery     Family History  Problem Relation Age of Onset  . Alzheimer's disease Mother   . Depression Mother    History  Substance Use Topics  . Smoking status: Current Every Day Smoker -- 1.00 packs/day    Types: Cigarettes  . Smokeless tobacco: Never Used  . Alcohol Use: 3.6 oz/week    6 Cans of beer per week   OB History    No data available     Review of  Systems  Constitutional: Negative for fever.  HENT: Negative for congestion.   Eyes: Negative for visual disturbance.  Respiratory: Negative for shortness of breath.   Cardiovascular: Negative for chest pain.  Gastrointestinal: Negative for abdominal pain.  Genitourinary: Positive for dysuria.  Musculoskeletal: Negative for back pain.  Skin: Negative for rash.  Psychiatric/Behavioral: Positive for behavioral problems. Negative for suicidal ideas and confusion. The patient is nervous/anxious.       Allergies  Review of patient's allergies indicates no known allergies.  Home Medications   Prior to Admission medications   Medication Sig Start Date End Date Taking? Authorizing Provider  benztropine (COGENTIN) 0.5 MG tablet Take 0.5 mg by mouth 2 (two) times daily.    Historical Provider, MD  divalproex (DEPAKOTE ER) 500 MG 24 hr tablet Take 1 tablet (500 mg total) by mouth 2 (two) times daily. 04/02/13   Zannie CovePreetha Joseph, MD  naproxen sodium (ANAPROX) 220 MG tablet Take 440 mg by mouth 2 (two) times daily.    Historical Provider, MD  OVER THE COUNTER MEDICATION Take 1 tablet by mouth at bedtime. Sleeping pill    Historical Provider, MD  risperiDONE (RISPERDAL) 2 MG tablet Take 2 mg by mouth at bedtime.    Historical Provider, MD  risperiDONE microspheres (RISPERDAL CONSTA) 50 MG injection Inject 50 mg into the muscle every 14 (fourteen) days.    Historical Provider, MD   BP 130/101 mmHg  Pulse 83  Temp(Src)  98.2 F (36.8 C) (Oral)  Resp 20  Ht 5\' 7"  (1.702 m)  Wt 200 lb (90.719 kg)  BMI 31.32 kg/m2  SpO2 100%  LMP 01/13/2012 Physical Exam  Constitutional: She appears well-developed and well-nourished. No distress.  HENT:  Head: Normocephalic and atraumatic.  Mouth/Throat: Oropharynx is clear and moist.  Eyes: Conjunctivae and EOM are normal. Pupils are equal, round, and reactive to light.  Neck: Normal range of motion. Neck supple.  Cardiovascular: Normal rate, regular rhythm  and normal heart sounds.   Pulmonary/Chest: Effort normal and breath sounds normal. No respiratory distress.  Abdominal: Soft. Bowel sounds are normal. She exhibits no distension.  Musculoskeletal: Normal range of motion.  Neurological: She is alert. No cranial nerve deficit. She exhibits normal muscle tone. Coordination normal.  Skin: Skin is warm. No rash noted.  Nursing note and vitals reviewed.   ED Course  Procedures (including critical care time) Labs Review Labs Reviewed  CBC WITH DIFFERENTIAL  BASIC METABOLIC PANEL  URINE RAPID DRUG SCREEN (HOSP PERFORMED)  ETHANOL    Imaging Review No results found.   EKG Interpretation None      MDM   Final diagnoses:  Schizoaffective disorder, bipolar type   [facility]. Patient is alert and oriented does have a history of schizoaffective bipolar. Patient seems to be an unsafe environment. Patient is followed by a psychiatrist. [facility] October [facility] for suicidal ideation substance abuse and for her bipolar disorder. Was discharged the next day when her suicidal ideations resolved. The patient has not had resolution for other concerns. Patient may be in an unsafe environment. Discussed with case management here and with [facility]. They reviewed her past history auricle information now recommended a formal consult. Completely aware that that we don't do this just for alcohol detox but the other concerns of her living arrangement and the fact that her other issues are not being addressed they wanted to go forward with a consult. Patient is willing to wait. Patient does deny any homicidal or suicidal ideation. She does not like her living arrangements. Patient the is probably not capable of making decisions completely on her own. No florid psychosis at the moment.    Vanetta MuldersScott Cassidey Barrales, MD 01/20/14 1659  Patient's valproic acid level was actually low non-toxic  range. A shunt also has no alcohol on board. Urine drug screen is negative. Patient does necessarily require inpatient alcohol detox with the concern was for the other psychiatric problems and her home environment.  Vanetta MuldersScott Kelseigh Diver, MD 01/20/14 1952  Patient evaluated by behavioral health. Patient is at the her baseline. She is in a boarding house. Patient does have the behavioral health follow-up that's appropriate and is plugged into the system. There are stating patient is clear for discharge. Will discharge.  Vanetta MuldersScott Angelica Frandsen, MD 01/20/14 2212

## 2014-01-20 NOTE — ED Notes (Signed)
Pt. Handed in pager to front desk and told registration she was leaving.

## 2014-01-20 NOTE — Discharge Instructions (Signed)
Follow-up with your outpatient behavioral health people. Return as needed. Cleared to go home by our behavioral health service.

## 2014-01-20 NOTE — ED Notes (Signed)
Pt was brought in by [employer] for alcohol detox. States she is bipolar and has been taking her medicine but the cops felt like she needed to be seen for detox.

## 2014-01-20 NOTE — BH Assessment (Signed)
Per Dr. Deretha EmoryZackowski pt is medically cleared, BIB in by police stating she was intoxicated. BAL <11. Pt has prior mental health hx of bipolar and schizoaffective disorder. Concerns that pt may not be having her mental health conditions managed adequately. Son is reportedly the POA.   Requested cart be placed with pt for assessment.   Assessment to commence shortly.   Clista BernhardtNancy Koral Thaden, [facility] Triage Specialist 01/20/2014 9:33 PM

## 2014-02-02 ENCOUNTER — Encounter (HOSPITAL_COMMUNITY): Payer: Self-pay | Admitting: Family Medicine

## 2014-02-02 ENCOUNTER — Emergency Department (HOSPITAL_COMMUNITY)
Admission: EM | Admit: 2014-02-02 | Discharge: 2014-02-02 | Disposition: A | Discharge status: Home / Self Care | Payer: Medicare Other | Attending: Emergency Medicine | Admitting: Emergency Medicine

## 2014-02-02 DIAGNOSIS — Z720 Tobacco use: Secondary | ICD-10-CM | POA: Insufficient documentation

## 2014-02-02 DIAGNOSIS — F250 Schizoaffective disorder, bipolar type: Secondary | ICD-10-CM | POA: Diagnosis not present

## 2014-02-02 DIAGNOSIS — E669 Obesity, unspecified: Secondary | ICD-10-CM | POA: Insufficient documentation

## 2014-02-02 DIAGNOSIS — F1410 Cocaine abuse, uncomplicated: Secondary | ICD-10-CM | POA: Insufficient documentation

## 2014-02-02 DIAGNOSIS — F1010 Alcohol abuse, uncomplicated: Secondary | ICD-10-CM | POA: Diagnosis present

## 2014-02-02 DIAGNOSIS — R300 Dysuria: Secondary | ICD-10-CM | POA: Diagnosis not present

## 2014-02-02 DIAGNOSIS — Z79899 Other long term (current) drug therapy: Secondary | ICD-10-CM | POA: Diagnosis not present

## 2014-02-02 DIAGNOSIS — F1910 Other psychoactive substance abuse, uncomplicated: Secondary | ICD-10-CM

## 2014-02-02 LAB — COMPREHENSIVE METABOLIC PANEL
ALK PHOS: 55 U/L (ref 39–117)
ALT: 16 U/L (ref 0–35)
ANION GAP: 12 (ref 5–15)
AST: 13 U/L (ref 0–37)
Albumin: 3.5 g/dL (ref 3.5–5.2)
BILIRUBIN TOTAL: 0.4 mg/dL (ref 0.3–1.2)
BUN: 13 mg/dL (ref 6–23)
CHLORIDE: 103 meq/L (ref 96–112)
CO2: 27 meq/L (ref 19–32)
Calcium: 9.6 mg/dL (ref 8.4–10.5)
Creatinine, Ser: 0.75 mg/dL (ref 0.50–1.10)
GFR calc non Af Amer: >90 mL/min (ref >90)
GLUCOSE: 103 mg/dL — AB (ref 70–99)
POTASSIUM: 3.9 meq/L (ref 3.7–5.3)
SODIUM: 142 meq/L (ref 137–147)
Total Protein: 7.5 g/dL (ref 6.0–8.3)

## 2014-02-02 LAB — URINALYSIS, ROUTINE W REFLEX MICROSCOPIC
BILIRUBIN URINE: NEGATIVE
Glucose, UA: NEGATIVE mg/dL
HGB URINE DIPSTICK: NEGATIVE
Ketones, ur: 15 mg/dL — AB
Leukocytes, UA: NEGATIVE
Nitrite: NEGATIVE
PROTEIN: NEGATIVE mg/dL
Specific Gravity, Urine: 1.03 (ref 1.005–1.030)
Urobilinogen, UA: 1 mg/dL (ref 0.0–1.0)
pH: 6 (ref 5.0–8.0)

## 2014-02-02 LAB — RAPID URINE DRUG SCREEN, HOSP PERFORMED
AMPHETAMINES: NOT DETECTED
Barbiturates: NOT DETECTED
Benzodiazepines: NOT DETECTED
Cocaine: POSITIVE — AB
OPIATES: NOT DETECTED
TETRAHYDROCANNABINOL: NOT DETECTED

## 2014-02-02 LAB — CBC
HEMATOCRIT: 41.6 % (ref 36.0–46.0)
Hemoglobin: 13.6 g/dL (ref 12.0–15.0)
MCH: 29.3 pg (ref 26.0–34.0)
MCHC: 32.7 g/dL (ref 30.0–36.0)
MCV: 89.7 fL (ref 78.0–100.0)
Platelets: 245 10*3/uL (ref 150–400)
RBC: 4.64 MIL/uL (ref 3.87–5.11)
RDW: 14.9 % (ref 11.5–15.5)
WBC: 4.8 10*3/uL (ref 4.0–10.5)

## 2014-02-02 LAB — ACETAMINOPHEN LEVEL: Acetaminophen (Tylenol), Serum: <15 ug/mL (ref 10–30)

## 2014-02-02 LAB — ETHANOL: Alcohol, Ethyl (B): <11 mg/dL (ref 0–11)

## 2014-02-02 LAB — SALICYLATE LEVEL: Salicylate Lvl: <2 mg/dL — ABNORMAL LOW (ref 2.8–20.0)

## 2014-02-02 NOTE — ED Provider Notes (Signed)
CSN: [id number]     Arrival date & time 02/02/14  1141 History   First MD Initiated Contact with Patient 02/02/14 1350     Chief Complaint  Patient presents with  . Alcohol Problem     (Consider location/radiation/quality/duration/timing/severity/associated sxs/prior Treatment) HPI  Pt with schizo-affective schizoprenia and bipolar disorder with occasional substance abuse presents requesting rehab for drugs and alcohol.  States she doesn't like where she lives.  Uses alcohol as often as she can get it, which is approximately once every two weeks.  When she drinks she usually drinks 2 - 40oz beers, last this weekend. She also occasionally uses crack, last this weekend.  States she only uses crack "every blue moon."   She admits to be being depressed but denies SI.  States she doesn't feel safe because "people on the street might beat me up."  These are not people she knows and she does not have fear or feel unsafe from anyone in particular.  Has low back pain and urinary frequency x many months.  Denies abnormal vaginal discharge or bleeding, denies any concern for STDs/HIV.  Otherwise denies any physical complaints.    Past Medical History  Diagnosis Date  . Arthritis   . Seizures   . Psychiatric illness   . Bipolar 1 disorder   . Schizo-affective schizophrenia   . Obesity   . Substance abuse    Past Surgical History  Procedure Laterality Date  . Stomach surgery     Family History  Problem Relation Age of Onset  . Alzheimer's disease Mother   . Depression Mother    History  Substance Use Topics  . Smoking status: Current Every Day Smoker -- 1.00 packs/day    Types: Cigarettes  . Smokeless tobacco: Never Used  . Alcohol Use: 3.6 oz/week    6 Cans of beer per week   OB History    No data available     Review of Systems  All other systems reviewed and are negative.     Allergies  Review of patient's allergies indicates no known allergies.  Home Medications   Prior  to Admission medications   Medication Sig Start Date End Date Taking? Authorizing Provider  benztropine (COGENTIN) 0.5 MG tablet Take 0.5 mg by mouth 2 (two) times daily.    Historical Provider, MD  divalproex (DEPAKOTE ER) 500 MG 24 hr tablet Take 1 tablet (500 mg total) by mouth 2 (two) times daily. 04/02/13   Zannie CovePreetha Joseph, MD  naproxen sodium (ANAPROX) 220 MG tablet Take 440 mg by mouth 2 (two) times daily as needed (FOR PAIN).     Historical Provider, MD  OVER THE COUNTER MEDICATION Take 1 tablet by mouth at bedtime. Sleeping pill    Historical Provider, MD  risperiDONE (RISPERDAL) 2 MG tablet Take 2 mg by mouth at bedtime.    Historical Provider, MD  risperiDONE microspheres (RISPERDAL CONSTA) 50 MG injection Inject 50 mg into the muscle every 14 (fourteen) days.    Historical Provider, MD   BP 145/101 mmHg  Pulse 110  Temp(Src) 97.9 F (36.6 C)  Resp 16  Ht 5\' 7"  (1.702 m)  Wt 220 lb (99.791 kg)  BMI 34.45 kg/m2  SpO2 98%  LMP 01/13/2012 Physical Exam  Constitutional: She appears well-developed and well-nourished. No distress.  HENT:  Head: Normocephalic and atraumatic.  Neck: Neck supple.  Cardiovascular: Normal rate and regular rhythm.   Pulmonary/Chest: Effort normal and breath sounds normal. No respiratory distress. She has no  wheezes. She has no rales.  Abdominal: Soft. She exhibits no distension. There is no tenderness. There is no rebound and no guarding.  Neurological: She is alert.  Skin: She is not diaphoretic.  Psychiatric: Her speech is normal. Her affect is angry.  Nursing note and vitals reviewed.   ED Course  Procedures (including critical care time) Labs Review Labs Reviewed  COMPREHENSIVE METABOLIC PANEL - Abnormal; Notable for the following:    Glucose, Bld 103 (*)    All other components within normal limits  SALICYLATE LEVEL - Abnormal; Notable for the following:    Salicylate Lvl <2.0 (*)    All other components within normal limits  URINE RAPID  DRUG SCREEN (HOSP PERFORMED) - Abnormal; Notable for the following:    Cocaine POSITIVE (*)    All other components within normal limits  URINALYSIS, ROUTINE W REFLEX MICROSCOPIC - Abnormal; Notable for the following:    APPearance CLOUDY (*)    Ketones, ur 15 (*)    All other components within normal limits  URINE CULTURE  ACETAMINOPHEN LEVEL  CBC  ETHANOL    Imaging Review No results found.   EKG Interpretation None      2:40 PM Plan for UA, contact with ACT team member.  Anticipate d/c home with resources.  3:40 PM Nursing staff have spoken with patient's son who is aware that the patient does not like her group home but agrees that she should be d/c back to this group home.  Staff unable to contact ACT team member as there is no answer at the number listed.  UA pending.   Urine sent for culture.  Advised pt to follow up with PCP regarding chronic dysuria.    MDM   Final diagnoses:  Schizoaffective disorder, bipolar type  Polysubstance abuse  Dysuria    Afebrile, nontoxic patient with psychiatric disorder requesting detox from alcohol and drugs.  While patient did use crack and alcohol over the weekend, she only uses alcohol once every two weeks and crack only occasionally.  She does not meet criteria for inpatient detox.  She denies suicidal ideation.  She does not appear to be psychotic.  She denies any physical symptoms except for mild low back pain and urinary symptoms.  UA unremarkable.  Sent for culture.  Labs otherwise remarkable only for positive cocaine on drug screen.   D/C home with resources for follow up.  Discussed result, findings, treatment, and follow up  with patient.  Pt given return precautions.  Pt verbalizes understanding and agrees with plan.         Trixie Dredgemily Gorman Safi, PA-C 02/02/14 1604  Linwood DibblesJon Knapp, MD 02/02/14 [phone]

## 2014-02-02 NOTE — ED Notes (Signed)
Lab advised will pull urine for test now. E [place], PA, aware.

## 2014-02-02 NOTE — ED Notes (Signed)
Per pt sts she has an alcohol problem and wants detox. sts she hasn't been taking her psych meds. sts drank last night. Denies SI/HI

## 2014-02-02 NOTE — ED Notes (Signed)
Pt placed in scrubs and wanded by security with belongings.

## 2014-02-02 NOTE — ED Notes (Addendum)
Jola BabinskiMarilyn (?) from group home advised she will have someone come to pick up pt.

## 2014-02-02 NOTE — ED Notes (Signed)
E [place], PA, in w/pt.

## 2014-02-02 NOTE — ED Notes (Signed)
E West, PA, in w/pt. 

## 2014-02-02 NOTE — Discharge Instructions (Signed)
Read the information below.  You may return to the Emergency Department at any time for worsening condition or any new symptoms that concern you.   Polysubstance Abuse When people abuse more than one drug or type of drug it is called polysubstance or polydrug abuse. For example, many smokers also drink alcohol. This is one form of polydrug abuse. Polydrug abuse also refers to the use of a drug to counteract an unpleasant effect produced by another drug. It may also be used to help with withdrawal from another drug. People who take stimulants may become agitated. Sometimes this agitation is countered with a tranquilizer. This helps protect against the unpleasant side effects. Polydrug abuse also refers to the use of different drugs at the same time.  Anytime drug use is interfering with normal living activities, it has become abuse. This includes problems with family and friends. Psychological dependence has developed when your mind tells you that the drug is needed. This is usually followed by physical dependence which has developed when continuing increases of drug are required to get the same feeling or "high". This is known as addiction or chemical dependency. A person's risk is much higher if there is a history of chemical dependency in the family. SIGNS OF CHEMICAL DEPENDENCY  You have been told by friends or family that drugs have become a problem.  You fight when using drugs.  You are having blackouts (not remembering what you do while using).  You feel sick from using drugs but continue using.  You lie about use or amounts of drugs (chemicals) used.  You need chemicals to get you going.  You are suffering in work performance or in school because of drug use.  You get sick from use of drugs but continue to use anyway.  You need drugs to relate to people or feel comfortable in social situations.  You use drugs to forget problems. "Yes" answered to any of the above signs of chemical  dependency indicates there are problems. The longer the use of drugs continues, the greater the problems will become. If there is a family history of drug or alcohol use, it is best not to experiment with these drugs. Continual use leads to tolerance. After tolerance develops more of the drug is needed to get the same feeling. This is followed by addiction. With addiction, drugs become the most important part of life. It becomes more important to take drugs than participate in the other usual activities of life. This includes relating to friends and family. Addiction is followed by dependency. Dependency is a condition where drugs are now needed not just to get high, but to feel normal. Addiction cannot be cured but it can be stopped. This often requires outside help and the care of professionals. Treatment centers are listed in the yellow pages under: Cocaine, Narcotics, and Alcoholics Anonymous. Most hospitals and clinics can refer you to a specialized care center. Talk to your caregiver if you need help. Document Released: 10/20/2004 Document Revised: 05/23/2011 Document Reviewed: 02/28/2005 [facility] Patient Information 2015 [place] [place]. This information is not intended to replace advice given to you by your health care provider. Make sure you discuss any questions you have with your health care provider.   Behavioral Health Resources in the [facility]  Intensive Outpatient Programs: [facility]      [address] [place], [place] [phone] Both a day and evening program       [facility]  [address]        [place], [place] [address] [phone]         ADS: [facility] [address] [place] [phone]  [facility] ACCESS LINE: [phone] or [phone] [address] [place], [place] [address] [email or web address]  Mobile Crisis Teams:                                         [facility]         Mobile Crisis Care Unit [phone]             [facility] [address] [place] [phone]                                         Interventionist [address] [place] [phone]  Self-Help/Support Groups: Mental Health Assoc. of [place] Variety of support groups [phone] (call for more info)  Narcotics Anonymous (NA) [facility] [address] [place] - 2 meetings at this location  Residential Treatment Programs:  [facility]      [address]        [place]       [phone]         [facility] [address], [place] [address] [place], [place]  [address] [phone]  [facility]  [address] [place], [place] [address] [phone] Admissions: 8am-3pm M-F  [facility]     [address]        [place], [place] [address]       [phone]         [facility] [address] [place], [place] [phone]  [facility] [address] [place], [place] [phone]  [facility] - Intensive Outpatient      [address]     [place], [place]       [phone]         [facility]     [address] [place], [place] [phone] or [phone]  [facility] ([facility])  [address] [place], [place] [phone]  [facility] [address]                                               [address] [place] [phone]  [facility] Resources: [phone]- [phone]               General Therapy                                                Angie FavaJulie Brannon, PhD        [address]                                       [place], [place] [address]  [phone]   Insurance  [facility]   [address] [place], [place] [address] [phone]  [facility] [address] [place], [place] [address] [phone] Insurance/Medicaid/sponsorship through [facility]                                              [address]                                        [place], [place] [address]    Therapy/tele-psych/case         [phone]          [facility] [address], [place]  [address]  Adolescent/group home/case management [phone]                                           Creola Corn PhD       General therapy       Insurance   [phone]         Dr. Lolly Mustache Insurance [phone] M-F  [place] [facility] Medicaid, sponsorship [phone]

## 2014-02-04 LAB — URINE CULTURE: Colony Count: 50000

## 2014-03-27 ENCOUNTER — Emergency Department (HOSPITAL_COMMUNITY): Payer: Medicare Other

## 2014-03-27 ENCOUNTER — Emergency Department (HOSPITAL_COMMUNITY)
Admission: EM | Admit: 2014-03-27 | Discharge: 2014-03-28 | Disposition: A | Discharge status: Home / Self Care | Payer: Medicare Other | Attending: Emergency Medicine | Admitting: Emergency Medicine

## 2014-03-27 ENCOUNTER — Encounter (HOSPITAL_COMMUNITY): Payer: Self-pay | Admitting: Emergency Medicine

## 2014-03-27 DIAGNOSIS — G40909 Epilepsy, unspecified, not intractable, without status epilepticus: Secondary | ICD-10-CM | POA: Insufficient documentation

## 2014-03-27 DIAGNOSIS — M1990 Unspecified osteoarthritis, unspecified site: Secondary | ICD-10-CM | POA: Insufficient documentation

## 2014-03-27 DIAGNOSIS — R05 Cough: Secondary | ICD-10-CM

## 2014-03-27 DIAGNOSIS — J159 Unspecified bacterial pneumonia: Secondary | ICD-10-CM | POA: Insufficient documentation

## 2014-03-27 DIAGNOSIS — R45851 Suicidal ideations: Secondary | ICD-10-CM | POA: Diagnosis present

## 2014-03-27 DIAGNOSIS — Z79899 Other long term (current) drug therapy: Secondary | ICD-10-CM | POA: Diagnosis not present

## 2014-03-27 DIAGNOSIS — F259 Schizoaffective disorder, unspecified: Secondary | ICD-10-CM | POA: Insufficient documentation

## 2014-03-27 DIAGNOSIS — F1410 Cocaine abuse, uncomplicated: Secondary | ICD-10-CM | POA: Insufficient documentation

## 2014-03-27 DIAGNOSIS — F319 Bipolar disorder, unspecified: Secondary | ICD-10-CM | POA: Diagnosis not present

## 2014-03-27 DIAGNOSIS — E669 Obesity, unspecified: Secondary | ICD-10-CM | POA: Insufficient documentation

## 2014-03-27 DIAGNOSIS — F1910 Other psychoactive substance abuse, uncomplicated: Secondary | ICD-10-CM

## 2014-03-27 DIAGNOSIS — J189 Pneumonia, unspecified organism: Secondary | ICD-10-CM

## 2014-03-27 DIAGNOSIS — F250 Schizoaffective disorder, bipolar type: Secondary | ICD-10-CM

## 2014-03-27 DIAGNOSIS — Z720 Tobacco use: Secondary | ICD-10-CM | POA: Diagnosis not present

## 2014-03-27 DIAGNOSIS — F1010 Alcohol abuse, uncomplicated: Secondary | ICD-10-CM | POA: Insufficient documentation

## 2014-03-27 DIAGNOSIS — R059 Cough, unspecified: Secondary | ICD-10-CM

## 2014-03-27 LAB — COMPREHENSIVE METABOLIC PANEL
ALT: 24 U/L (ref 0–35)
AST: 19 U/L (ref 0–37)
Albumin: 3.9 g/dL (ref 3.5–5.2)
Alkaline Phosphatase: 65 U/L (ref 39–117)
Anion gap: 10 (ref 5–15)
BUN: 21 mg/dL (ref 6–23)
CO2: 26 mmol/L (ref 19–32)
Calcium: 8.9 mg/dL (ref 8.4–10.5)
Chloride: 98 mEq/L (ref 96–112)
Creatinine, Ser: 0.88 mg/dL (ref 0.50–1.10)
GFR calc Af Amer: 86 mL/min — ABNORMAL LOW (ref >90)
GFR calc non Af Amer: 74 mL/min — ABNORMAL LOW (ref >90)
Glucose, Bld: 92 mg/dL (ref 70–99)
Potassium: 3.8 mmol/L (ref 3.5–5.1)
SODIUM: 134 mmol/L — AB (ref 135–145)
Total Bilirubin: 0.3 mg/dL (ref 0.3–1.2)
Total Protein: 7.9 g/dL (ref 6.0–8.3)

## 2014-03-27 LAB — CBC
HCT: 43.5 % (ref 36.0–46.0)
Hemoglobin: 14.1 g/dL (ref 12.0–15.0)
MCH: 29.2 pg (ref 26.0–34.0)
MCHC: 32.4 g/dL (ref 30.0–36.0)
MCV: 90.1 fL (ref 78.0–100.0)
Platelets: 255 10*3/uL (ref 150–400)
RBC: 4.83 MIL/uL (ref 3.87–5.11)
RDW: 14.5 % (ref 11.5–15.5)
WBC: 5.9 10*3/uL (ref 4.0–10.5)

## 2014-03-27 LAB — RAPID URINE DRUG SCREEN, HOSP PERFORMED
Amphetamines: NOT DETECTED
Barbiturates: NOT DETECTED
Benzodiazepines: NOT DETECTED
Cocaine: POSITIVE — AB
OPIATES: NOT DETECTED
TETRAHYDROCANNABINOL: NOT DETECTED

## 2014-03-27 LAB — ACETAMINOPHEN LEVEL: Acetaminophen (Tylenol), Serum: <10 ug/mL — ABNORMAL LOW (ref 10–30)

## 2014-03-27 LAB — ETHANOL: Alcohol, Ethyl (B): <5 mg/dL (ref 0–9)

## 2014-03-27 LAB — SALICYLATE LEVEL: Salicylate Lvl: <4 mg/dL (ref 2.8–20.0)

## 2014-03-27 MED ORDER — AZITHROMYCIN 250 MG PO TABS: 250.0000 mg | ORAL_TABLET | Freq: Every day | ORAL | Status: DC

## 2014-03-27 MED ORDER — AZITHROMYCIN 250 MG PO TABS
500.0000 mg | ORAL_TABLET | Freq: Once | ORAL | Status: AC
  Administered 2014-03-27: 500 mg via ORAL
  Filled 2014-03-27: qty 2

## 2014-03-27 MED ORDER — IBUPROFEN 200 MG PO TABS
600.0000 mg | ORAL_TABLET | Freq: Three times a day (TID) | ORAL | Status: DC | PRN
  Administered 2014-03-28: 600 mg via ORAL
  Filled 2014-03-27: qty 3

## 2014-03-27 MED ORDER — ONDANSETRON HCL 4 MG PO TABS: 4.0000 mg | ORAL_TABLET | Freq: Three times a day (TID) | ORAL | Status: DC | PRN

## 2014-03-27 MED ORDER — ACETAMINOPHEN 325 MG PO TABS: 650.0000 mg | ORAL_TABLET | ORAL | Status: DC | PRN

## 2014-03-27 MED ORDER — LORAZEPAM 1 MG PO TABS: 1.0000 mg | ORAL_TABLET | Freq: Three times a day (TID) | ORAL | Status: DC | PRN

## 2014-03-27 MED ORDER — ONDANSETRON 4 MG PO TBDP: 4.0000 mg | ORAL_TABLET | Freq: Once | ORAL | Status: DC

## 2014-03-27 MED ORDER — ALUM & MAG HYDROXIDE-SIMETH 200-200-20 MG/5ML PO SUSP: 30.0000 mL | ORAL | Status: DC | PRN

## 2014-03-27 NOTE — ED Notes (Signed)
Pt reports she needs help.  Reports SI with no plan.  Pt states that she is tired of living on the streets and drinking.  States last drink was 2 days ago.  Pt also reports smoking a "rock" night before last.  Pt is calm and cooperative at this time.  Pt reports she needs help, states she hasn't seen her daughter in over a year.

## 2014-03-27 NOTE — ED Notes (Signed)
Pt with Hx of bipolar disorder states she was brought in by [employer] for suicidal ideation without a plan, states she is detoxing off of ETOH and nicotine, states last drink was two nights ago. Pt states she normally drinks several 40 oz cans of beer per day, unable to provide exact amount. Pt denies any thoughts of harming others, states she feels unsafe.

## 2014-03-27 NOTE — BH Assessment (Addendum)
Tele Assessment Note   Mary LefortDebra [name] is an African-American, single, 54 y.o. female who [facility] detox from alcohol, crack cocaine, and nicotene. Pt came to ED with [employer] after calling EMS due to suicidal thoughts. She reports that her plan was to "party to death" by drinking and using substances recklessly. Pt presents with tangential thought processes with evidence of paranoid ideations, stating that she is fearful that people are out to harm her no matter where she is. Pt says she has a hx of diagnoses of schizophrenia and schizo-affective disorder. Pt's hygiene is poor and thoughts and speech are disorganized. Pt reports that she is a grandmother now and wants to finally get clean and sober. Pt is currently living in a boarding house and is very unhappy there. She says that she used to live with her son, [name] (whom is also reportedly her guardian); however she was drinking too much alcohol and her son asked her to move out, so she went to a boarding house. Pt denies HI or any current A/VH but she says that she used to hear voices before they suddenly stopped. Pt endorses several previous suicide attempts, stating that her struggles with substance abuse and loneliness were triggers for these attempts. She states that Bipolar Disorder and Schizophrenia run in her family and that she has had multiple psychiatric hospitalizations in her life. Pt has strained relationships with her family and has few friends. She reports current sx of depression, including fatigue, irritability, social isolation, and feelings of hopelessness and helplessness. Pt has been abusing drugs and alcohol since her teens. UDS was positive for cocaine. Pt did not even want to talk about her hx of drug or alcohol use at length because "it's making me crave drugs and liquor". Pt denies hx of any type of abuse. Pt denies access to weapons.    Per Nonah Mattesory Burkett, PA, pt meets inpatient criteria. [facility]. TTS to seek placement elsewhere. Pt notified of disposition.  ---------------------------------  Axis I: 295.70 Schizoaffective disorder, Bipolar type, by hx 305.60 Cocaine use disorder, Mild 305.00 Alcohol use disorder, Mild  Axis II: No diagnosis Axis III:  Past Medical History  Diagnosis Date  . Arthritis   . Seizures   . Psychiatric illness   . Bipolar 1 disorder   . Schizo-affective schizophrenia   . Obesity   . Substance abuse    Axis IV: economic problems, housing problems, occupational problems, other psychosocial or environmental problems, problems related to social environment, problems with access to health care services and problems with primary support group Axis V: 31-40 impairment in reality testing  Past Medical History:  Past Medical History  Diagnosis Date  . Arthritis   . Seizures   . Psychiatric illness   . Bipolar 1 disorder   . Schizo-affective schizophrenia   . Obesity   . Substance abuse     Past Surgical History  Procedure Laterality Date  . Stomach surgery      Family History:  Family History  Problem Relation Age of Onset  . Alzheimer's disease Mother   . Depression Mother     Social History:  reports that she has been smoking Cigarettes.  She has been smoking about 1.00 pack per day. She has never used smokeless tobacco. She reports that she drinks about 3.6 oz of alcohol per week. She reports that she uses illicit drugs ("Crack" cocaine and Cocaine) about 5 times per week.  Additional Social History:  CIWA: CIWA-Ar BP: 148/97 mmHg Pulse Rate: 80 COWS:    PATIENT STRENGTHS: (choose at least two) Communication skills Physical Health  Allergies: No Known Allergies  Home Medications:  (Not in a hospital admission)  OB/GYN Status:  Patient's last menstrual period was 01/13/2012.  General Assessment Data Admission Status: Voluntary           Risk to self with the past 6 months Is patient at risk for suicide?:  Yes Substance abuse history and/or treatment for substance abuse?: Yes        Mental Status Report Motor Activity: Freedom of movement                            Advance Directives (For Healthcare) Does patient have an advance directive?: No          Disposition: Per Nonah Mattes, PA, pt meets inpatient criteria. No beds at [facility]. TTS to seek placement.     Cyndie Mull, [facility] Triage Specialist  03/27/2014 10:01 PM

## 2014-03-27 NOTE — ED Provider Notes (Signed)
CSN: [id number]     Arrival date & time 03/27/14  1651 History   First MD Initiated Contact with Patient 03/27/14 1727     Chief Complaint  Patient presents with  . Suicidal  . Detox      (Consider location/radiation/quality/duration/timing/severity/associated sxs/prior Treatment) Patient is a 54 y.o. female presenting with mental health disorder.  Mental Health Problem Presenting symptoms: suicidal thoughts   Presenting symptoms comment:  Substance abuse, requesting detox Degree of incapacity (severity):  Severe Onset quality:  Gradual Duration: chronic, worse over several days. Timing:  Constant Progression:  Worsening Chronicity:  Chronic Context: alcohol use and drug abuse   Context comment:  "I don't want to be living on the street like all those other girls." Relieved by:  Nothing Worsened by:  Nothing tried Associated symptoms: feelings of worthlessness, irritability and poor judgment   Associated symptoms: no abdominal pain   Associated symptoms comment:  Cough   Past Medical History  Diagnosis Date  . Arthritis   . Seizures   . Psychiatric illness   . Bipolar 1 disorder   . Schizo-affective schizophrenia   . Obesity   . Substance abuse    Past Surgical History  Procedure Laterality Date  . Stomach surgery     Family History  Problem Relation Age of Onset  . Alzheimer's disease Mother   . Depression Mother    History  Substance Use Topics  . Smoking status: Current Every Day Smoker -- 1.00 packs/day    Types: Cigarettes  . Smokeless tobacco: Never Used  . Alcohol Use: 3.6 oz/week    6 Cans of beer per week   OB History    No data available     Review of Systems  Constitutional: Positive for irritability.  Respiratory: Positive for cough. Negative for shortness of breath.   Gastrointestinal: Negative for abdominal pain.  Psychiatric/Behavioral: Positive for suicidal ideas.  All other systems reviewed and are negative.     Allergies   Review of patient's allergies indicates no known allergies.  Home Medications   Prior to Admission medications   Medication Sig Start Date End Date Taking? Authorizing Provider  benztropine (COGENTIN) 0.5 MG tablet Take 0.5 mg by mouth 2 (two) times daily.    Historical Provider, MD  divalproex (DEPAKOTE ER) 500 MG 24 hr tablet Take 1 tablet (500 mg total) by mouth 2 (two) times daily. 04/02/13   Zannie CovePreetha Joseph, MD  naproxen sodium (ANAPROX) 220 MG tablet Take 440 mg by mouth 2 (two) times daily as needed (FOR PAIN).     Historical Provider, MD  OVER THE COUNTER MEDICATION Take 1 tablet by mouth at bedtime. Sleeping pill    Historical Provider, MD  risperiDONE (RISPERDAL) 2 MG tablet Take 2 mg by mouth at bedtime.    Historical Provider, MD  risperiDONE microspheres (RISPERDAL CONSTA) 50 MG injection Inject 50 mg into the muscle every 14 (fourteen) days.    Historical Provider, MD   BP 143/103 mmHg  Pulse 89  Temp(Src) 97.8 F (36.6 C) (Oral)  Resp 18  SpO2 95%  LMP 01/13/2012 Physical Exam  Constitutional: She is oriented to person, place, and time. She appears well-developed and well-nourished. No distress.  HENT:  Head: Normocephalic and atraumatic.  Mouth/Throat: Oropharynx is clear and moist.  Eyes: Conjunctivae are normal. Pupils are equal, round, and reactive to light. No scleral icterus.  Neck: Neck supple.  Cardiovascular: Normal rate, regular rhythm, normal heart sounds and intact distal pulses.   No  murmur heard. Pulmonary/Chest: Effort normal and breath sounds normal. No stridor. No respiratory distress. She has no wheezes. She has no rales.  Abdominal: Soft. Bowel sounds are normal. She exhibits no distension. There is no tenderness.  Musculoskeletal: Normal range of motion.  Neurological: She is alert and oriented to person, place, and time.  Skin: Skin is warm and dry. No rash noted.  Psychiatric: She has a normal mood and affect. Her speech is slurred. She is  hyperactive. She expresses suicidal ideation.  Nursing note and vitals reviewed.   ED Course  Procedures (including critical care time) Labs Review Labs Reviewed  ACETAMINOPHEN LEVEL - Abnormal; Notable for the following:    Acetaminophen (Tylenol), Serum <10.0 (*)    All other components within normal limits  COMPREHENSIVE METABOLIC PANEL - Abnormal; Notable for the following:    Sodium 134 (*)    GFR calc non Af Amer 74 (*)    GFR calc Af Amer 86 (*)    All other components within normal limits  URINE RAPID DRUG SCREEN (HOSP PERFORMED) - Abnormal; Notable for the following:    Cocaine POSITIVE (*)    All other components within normal limits  CBC  ETHANOL  SALICYLATE LEVEL    Imaging Review Dg Chest 2 View  03/27/2014   CLINICAL DATA:  Cough, congestion and mid chest pain for 1 week. History of smoking.  EXAM: CHEST  2 VIEW  COMPARISON:  03/29/2013; 10/18/2011; 06/21/2010  FINDINGS: Grossly unchanged enlarged cardiac silhouette and mediastinal contours given reduced lung volumes. Filling opacities overlying the bilateral lower lungs favored to represent overlying breast tissues. There is unchanged diffuse nodular thickening of the pulmonary interstitium. Worsening ill-defined heterogeneous nodular airspace opacities within the bilateral lower lungs. There is minimal pleural parenchymal thickening about the bilateral major fissures. No pleural effusion or pneumothorax. No evidence of edema. No acute osseus abnormalities.  IMPRESSION: Findings worrisome for bibasilar atypical infection superimposed on airways disease / bronchitis. A follow-up chest radiograph in 4 to 6 weeks after treatment is recommended to ensure resolution.   Electronically Signed   By: Simonne Come M.D.   On: 03/27/2014 19:12  All radiology studies independently viewed by me.      EKG Interpretation None      MDM   Final diagnoses:  Cough  Substance abuse  Community acquired pneumonia  Suicidal ideations     54 year old female presenting with complaints of substance abuse (alcohol and crack) requesting detox. Also complains of vague suicidality. Speech is slurred and I'm concerned that she is acutely intoxicated, although she denies any alcohol for past 2 days. Will check labs, chest x-ray (she also complains of cough).    She has evidence of atypical pneumonia.  However, she is not febrile, has stable vitals, normal O2 sats, normal respiratory effort, and no leukocytosis.  I believe she can be treated with oral medications, have started course of azithromycin.  Otherwise, she is medically stable for psychiatric evaluation.     Candyce Churn III, MD 03/27/14 [phone]

## 2014-03-27 NOTE — ED Notes (Signed)
TTS at bedside. 

## 2014-03-28 DIAGNOSIS — F250 Schizoaffective disorder, bipolar type: Secondary | ICD-10-CM | POA: Insufficient documentation

## 2014-03-28 DIAGNOSIS — F1994 Other psychoactive substance use, unspecified with psychoactive substance-induced mood disorder: Secondary | ICD-10-CM

## 2014-03-28 DIAGNOSIS — R45851 Suicidal ideations: Secondary | ICD-10-CM | POA: Insufficient documentation

## 2014-03-28 DIAGNOSIS — F259 Schizoaffective disorder, unspecified: Secondary | ICD-10-CM

## 2014-03-28 DIAGNOSIS — F1099 Alcohol use, unspecified with unspecified alcohol-induced disorder: Secondary | ICD-10-CM

## 2014-03-28 DIAGNOSIS — F1490 Cocaine use, unspecified, uncomplicated: Secondary | ICD-10-CM

## 2014-03-28 MED ORDER — RISPERIDONE 2 MG PO TABS: 2.0000 mg | ORAL_TABLET | Freq: Every day | ORAL | Status: DC

## 2014-03-28 MED ORDER — BENZTROPINE MESYLATE 1 MG PO TABS
0.5000 mg | ORAL_TABLET | Freq: Two times a day (BID) | ORAL | Status: DC
  Administered 2014-03-28: 0.5 mg via ORAL
  Filled 2014-03-28: qty 1

## 2014-03-28 MED ORDER — DIVALPROEX SODIUM ER 500 MG PO TB24
500.0000 mg | ORAL_TABLET | Freq: Two times a day (BID) | ORAL | Status: DC
  Administered 2014-03-28: 500 mg via ORAL
  Filled 2014-03-28 (×2): qty 1
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
## 2014-03-28 NOTE — ED Notes (Signed)
Spoke with TTS. Pt to be discharged. Gave pt phone to call for ride home.

## 2014-03-28 NOTE — BH Assessment (Signed)
[facility] Assessment Progress Note  Per Thedore MinsMojeed Akintayo, MD, [facility]  Pt's ACT Team provider, [facility], is to be contacted to facilitate transportation back home.  At 16:18 this Clinical research associatewriter spoke to [place] at [facility] [phone]).  She reports that she has already spoken to the pt and transportation is being negotiated between pt's ACT Team provider and her group home staff.  Doylene Canninghomas Norval Slaven, MA Triage Specialist 03/28/2014 @ 16:21

## 2014-03-28 NOTE — ED Notes (Addendum)
Pt has 2 bags pt belongings containing one pair knit gloves, 3 lighters, one white watch, 1 plaid shirt, 1 pair grey sweatpants, 1 pair pink athletic shoes, 1 red knit cap, 1 bra and 1 black coat. Belonging bags at nurse's station.

## 2014-03-28 NOTE — [facility] Suicide Risk Assessment (Signed)
Suicide Risk Assessment  Discharge Assessment     Demographic Factors:  Low socioeconomic status  Total Time spent with patient: 30 minutes  Psychiatric Specialty Exam:     Blood pressure 160/88, pulse 100, temperature 98 F (36.7 C), temperature source Oral, resp. rate 18, last menstrual period 01/13/2012, SpO2 100 %.There is no weight on file to calculate BMI.  General Appearance: Casual and Fairly Groomed  Patent attorneyye Contact::  Good  Speech:  Clear and Coherent and Normal Rate  Volume:  Normal  Mood:  Depressed  Affect:  Congruent and Depressed  Thought Process:  Coherent, Goal Directed and Intact  Orientation:  Full (Time, Place, and Person)  Thought Content:  WDL  Suicidal Thoughts:  No  Homicidal Thoughts:  No  Memory:  Immediate;   Good Recent;   Good Remote;   Fair  Judgement:  Poor  Insight:  Shallow  Psychomotor Activity:  Normal  Concentration:  Good  Recall:  NA  Fund of Knowledge:Poor  Language: Good  Akathisia:  NA  Handed:  Right  AIMS (if indicated):     Assets:  Desire for Improvement  Sleep:       Musculoskeletal: Strength & Muscle Tone: within normal limits Gait & Station: normal Patient leans: N/A   Mental Status Per Nursing Assessment::   On Admission:     Current Mental Status by Physician: NA  Loss Factors: NA  Historical Factors: NA  Risk Reduction Factors:   Living with another person, especially a relative and Positive social support  Continued Clinical Symptoms:  Bipolar Disorder:   Depressive phase Depression:   Insomnia  Cognitive Features That Contribute To Risk:  Polarized thinking    Suicide Risk:  Minimal: No identifiable suicidal ideation.  Patients presenting with no risk factors but with morbid ruminations; may be classified as minimal risk based on the severity of the depressive symptoms  Discharge Diagnoses:   AXIS I:  Mood Disorder NOS and Schizoaffective Disorder AXIS II:  Deferred AXIS III:   Past Medical  History  Diagnosis Date  . Arthritis   . Seizures   . Psychiatric illness   . Bipolar 1 disorder   . Schizo-affective schizophrenia   . Obesity   . Substance abuse    AXIS IV:  housing problems, other psychosocial or environmental problems and problems related to social environment AXIS V:  61-70 mild symptoms  Plan Of Care/Follow-up recommendations:  Activity:  As tolerated Diet:  Regular  Is patient on multiple antipsychotic therapies at discharge:  No   Has Patient had three or more failed trials of antipsychotic monotherapy by history:  No  Recommended Plan for Multiple Antipsychotic Therapies: NA    [name], Senna Lape, C  PMHNP-BC 03/28/2014, 5:03 PM

## 2014-03-28 NOTE — ED Notes (Signed)
Belongings returned to pt. Pt called for ride will meet pt in the lobby shortly.

## 2014-03-28 NOTE — ED Notes (Signed)
Pts ride at bedside

## 2014-03-28 NOTE — [facility] Counselor (Addendum)
Per Nonah Mattesory Burkett, PA, pt meets inpatient criteria. [facility] TTS to seek placement elsewhere. Pt notified of disposition in person by counselor. Pt is agreeable.  TTS Counselor informed pt's attending RN of disposition and need for inpt placement at another facility.   Cyndie MullAnna Heyward Douthit, [facility] Triage Specialist

## 2014-03-28 NOTE — ED Notes (Signed)
Per TTS, [facility] but [facility] not have available bed at this time,  Terri CN made aware

## 2014-04-18 ENCOUNTER — Emergency Department (HOSPITAL_COMMUNITY)
Admission: EM | Admit: 2014-04-18 | Discharge: 2014-04-19 | Disposition: A | Discharge status: Home / Self Care | Payer: Medicare Other | Attending: Emergency Medicine | Admitting: Emergency Medicine

## 2014-04-18 ENCOUNTER — Encounter (HOSPITAL_COMMUNITY): Payer: Self-pay | Admitting: Emergency Medicine

## 2014-04-18 ENCOUNTER — Emergency Department (HOSPITAL_COMMUNITY): Payer: Medicare Other

## 2014-04-18 DIAGNOSIS — F1410 Cocaine abuse, uncomplicated: Secondary | ICD-10-CM | POA: Diagnosis not present

## 2014-04-18 DIAGNOSIS — G40909 Epilepsy, unspecified, not intractable, without status epilepticus: Secondary | ICD-10-CM | POA: Insufficient documentation

## 2014-04-18 DIAGNOSIS — R079 Chest pain, unspecified: Secondary | ICD-10-CM | POA: Diagnosis present

## 2014-04-18 DIAGNOSIS — F319 Bipolar disorder, unspecified: Secondary | ICD-10-CM | POA: Insufficient documentation

## 2014-04-18 DIAGNOSIS — M549 Dorsalgia, unspecified: Secondary | ICD-10-CM | POA: Insufficient documentation

## 2014-04-18 DIAGNOSIS — Z79899 Other long term (current) drug therapy: Secondary | ICD-10-CM | POA: Insufficient documentation

## 2014-04-18 DIAGNOSIS — E669 Obesity, unspecified: Secondary | ICD-10-CM | POA: Diagnosis not present

## 2014-04-18 DIAGNOSIS — R0789 Other chest pain: Secondary | ICD-10-CM | POA: Insufficient documentation

## 2014-04-18 DIAGNOSIS — F250 Schizoaffective disorder, bipolar type: Secondary | ICD-10-CM | POA: Insufficient documentation

## 2014-04-18 DIAGNOSIS — F1910 Other psychoactive substance abuse, uncomplicated: Secondary | ICD-10-CM

## 2014-04-18 DIAGNOSIS — Z720 Tobacco use: Secondary | ICD-10-CM | POA: Diagnosis not present

## 2014-04-18 LAB — COMPREHENSIVE METABOLIC PANEL
ALBUMIN: 3.6 g/dL (ref 3.5–5.2)
ALK PHOS: 62 U/L (ref 39–117)
ALT: 38 U/L — AB (ref 0–35)
AST: 28 U/L (ref 0–37)
Anion gap: 11 (ref 5–15)
BUN: 12 mg/dL (ref 6–23)
CO2: 25 mmol/L (ref 19–32)
Calcium: 9.3 mg/dL (ref 8.4–10.5)
Chloride: 103 mmol/L (ref 96–112)
Creatinine, Ser: 0.73 mg/dL (ref 0.50–1.10)
GFR calc Af Amer: >90 mL/min (ref >90)
GLUCOSE: 95 mg/dL (ref 70–99)
POTASSIUM: 4.4 mmol/L (ref 3.5–5.1)
SODIUM: 139 mmol/L (ref 135–145)
TOTAL PROTEIN: 7 g/dL (ref 6.0–8.3)
Total Bilirubin: 0.4 mg/dL (ref 0.3–1.2)

## 2014-04-18 LAB — RAPID URINE DRUG SCREEN, HOSP PERFORMED
Amphetamines: NOT DETECTED
BARBITURATES: NOT DETECTED
Benzodiazepines: NOT DETECTED
Cocaine: POSITIVE — AB
OPIATES: NOT DETECTED
Tetrahydrocannabinol: NOT DETECTED

## 2014-04-18 LAB — URINALYSIS, ROUTINE W REFLEX MICROSCOPIC
BILIRUBIN URINE: NEGATIVE
Glucose, UA: NEGATIVE mg/dL
Hgb urine dipstick: NEGATIVE
KETONES UR: 15 mg/dL — AB
LEUKOCYTES UA: NEGATIVE
Nitrite: NEGATIVE
Protein, ur: NEGATIVE mg/dL
SPECIFIC GRAVITY, URINE: 1.028 (ref 1.005–1.030)
UROBILINOGEN UA: 0.2 mg/dL (ref 0.0–1.0)
pH: 5.5 (ref 5.0–8.0)

## 2014-04-18 LAB — CBC WITH DIFFERENTIAL/PLATELET
BASOS PCT: 1 % (ref 0–1)
Basophils Absolute: 0 10*3/uL (ref 0.0–0.1)
EOS PCT: 1 % (ref 0–5)
Eosinophils Absolute: 0.1 10*3/uL (ref 0.0–0.7)
HEMATOCRIT: 39.3 % (ref 36.0–46.0)
Hemoglobin: 13.1 g/dL (ref 12.0–15.0)
LYMPHS PCT: 47 % — AB (ref 12–46)
Lymphs Abs: 3.1 10*3/uL (ref 0.7–4.0)
MCH: 28.5 pg (ref 26.0–34.0)
MCHC: 33.3 g/dL (ref 30.0–36.0)
MCV: 85.4 fL (ref 78.0–100.0)
Monocytes Absolute: 0.4 10*3/uL (ref 0.1–1.0)
Monocytes Relative: 6 % (ref 3–12)
NEUTROS ABS: 3 10*3/uL (ref 1.7–7.7)
NEUTROS PCT: 45 % (ref 43–77)
Platelets: 239 10*3/uL (ref 150–400)
RBC: 4.6 MIL/uL (ref 3.87–5.11)
RDW: 14.8 % (ref 11.5–15.5)
WBC: 6.6 10*3/uL (ref 4.0–10.5)

## 2014-04-18 LAB — SALICYLATE LEVEL: Salicylate Lvl: <4 mg/dL (ref 2.8–20.0)

## 2014-04-18 LAB — ACETAMINOPHEN LEVEL

## 2014-04-18 LAB — I-STAT TROPONIN, ED: TROPONIN I, POC: 0 ng/mL (ref 0.00–0.08)

## 2014-04-18 LAB — TROPONIN I: Troponin I: <0.03 ng/mL (ref <0.031)

## 2014-04-18 LAB — BRAIN NATRIURETIC PEPTIDE: B Natriuretic Peptide: 32.9 pg/mL (ref 0.0–100.0)

## 2014-04-18 LAB — ETHANOL: Alcohol, Ethyl (B): <5 mg/dL (ref 0–9)

## 2014-04-18 LAB — PREGNANCY, URINE: Preg Test, Ur: NEGATIVE

## 2014-04-18 MED ORDER — NICOTINE 21 MG/24HR TD PT24: 21.0000 mg | MEDICATED_PATCH | Freq: Every day | TRANSDERMAL | Status: DC

## 2014-04-18 MED ORDER — ACETAMINOPHEN 325 MG PO TABS: 650.0000 mg | ORAL_TABLET | ORAL | Status: DC | PRN

## 2014-04-18 MED ORDER — ONDANSETRON HCL 4 MG PO TABS: 4.0000 mg | ORAL_TABLET | Freq: Three times a day (TID) | ORAL | Status: DC | PRN

## 2014-04-18 MED ORDER — LORAZEPAM 1 MG PO TABS: 1.0000 mg | ORAL_TABLET | Freq: Three times a day (TID) | ORAL | Status: DC | PRN

## 2014-04-18 MED ORDER — LORAZEPAM 1 MG PO TABS: 0.0000 mg | ORAL_TABLET | Freq: Two times a day (BID) | ORAL | Status: DC

## 2014-04-18 MED ORDER — BENZTROPINE MESYLATE 1 MG PO TABS
0.5000 mg | ORAL_TABLET | Freq: Two times a day (BID) | ORAL | Status: DC
  Administered 2014-04-18: 0.5 mg via ORAL
  Filled 2014-04-18: qty 1

## 2014-04-18 MED ORDER — RISPERIDONE 0.5 MG PO TABS
2.0000 mg | ORAL_TABLET | Freq: Every day | ORAL | Status: DC
  Administered 2014-04-18: 2 mg via ORAL
  Filled 2014-04-18: qty 4

## 2014-04-18 MED ORDER — DIVALPROEX SODIUM ER 500 MG PO TB24
500.0000 mg | ORAL_TABLET | Freq: Two times a day (BID) | ORAL | Status: DC
  Administered 2014-04-18: 500 mg via ORAL
  Filled 2014-04-18 (×3): qty 1

## 2014-04-18 MED ORDER — ALUM & MAG HYDROXIDE-SIMETH 200-200-20 MG/5ML PO SUSP: 30.0000 mL | ORAL | Status: DC | PRN

## 2014-04-18 MED ORDER — VITAMIN B-1 100 MG PO TABS: 100.0000 mg | ORAL_TABLET | Freq: Every day | ORAL | Status: DC

## 2014-04-18 MED ORDER — LIDOCAINE 5 % EX PTCH
1.0000 | MEDICATED_PATCH | CUTANEOUS | Status: DC
  Administered 2014-04-18: 1 via TRANSDERMAL
  Filled 2014-04-18 (×2): qty 1

## 2014-04-18 MED ORDER — LORAZEPAM 1 MG PO TABS: 0.0000 mg | ORAL_TABLET | Freq: Four times a day (QID) | ORAL | Status: DC

## 2014-04-18 MED ORDER — IBUPROFEN 400 MG PO TABS: 600.0000 mg | ORAL_TABLET | Freq: Three times a day (TID) | ORAL | Status: DC | PRN

## 2014-04-18 MED ORDER — THIAMINE HCL 100 MG/ML IJ SOLN
100.0000 mg | Freq: Every day | INTRAMUSCULAR | Status: DC
Start: 2014-04-19 — End: 2014-04-19

## 2014-04-18 NOTE — ED Notes (Signed)
[facility] called for status on patient. Stated waiting for test results.

## 2014-04-18 NOTE — ED Notes (Signed)
EM[facility] Patient stated to EMS chest pain 6/10. [facility] gave 324mg  aspirin and EMS gave 1 nitro. Upon arrival patient states never had chest pain some shortness of breath and lower back pain.  Patient states would like detox from crack, beer, and wine.  Alert answering and following commands appropriate.

## 2014-04-18 NOTE — [facility] Counselor (Signed)
TTS Counselor called to begin assessment.  RN, Benna Dunksaryn, advised that pt's blood pressure is slightly high and she wants to address it before the assessment.  She stated she will call in 5 mins to say whether we need to postpone the assessment or not.  Beryle FlockMary Ernesteen Mihalic, MS, CRC, [facility] Triage Specialist [facility]

## 2014-04-18 NOTE — ED Provider Notes (Signed)
CSN: [id number]     Arrival date & time 04/18/14  1831 History   First MD Initiated Contact with Patient 04/18/14 1833     Chief Complaint  Patient presents with  . Chest Pain  . Back Pain     (Consider location/radiation/quality/duration/timing/severity/associated sxs/prior Treatment) HPI Comments: Patient is a 54 year old female past medical history significant for schizoaffective schizophrenia, polysubstance abuse, seizures, tobacco abuse presenting to the emergency department from [facility] for evaluation of acute onset chest pain with shortness of breath that began approximately 1 hour prior to arrival, she states her chest pain and shortness of breath have resolved since the time being. Patient is more concerned about her substance abuse, requesting detox from crack cocaine, alcohol. She states that she drinks "as much as I can when I can get it." Last request today, last use of crack cocaine was today. Patient is a level V caveat d/t psychiatric illness.   Patient is a 54 y.o. female presenting with chest pain and back pain. The history is provided by the patient.  Chest Pain Associated symptoms: back pain   Back Pain Associated symptoms: chest pain     Past Medical History  Diagnosis Date  . Arthritis   . Seizures   . Psychiatric illness   . Bipolar 1 disorder   . Schizo-affective schizophrenia   . Obesity   . Substance abuse    Past Surgical History  Procedure Laterality Date  . Stomach surgery     Family History  Problem Relation Age of Onset  . Alzheimer's disease Mother   . Depression Mother    History  Substance Use Topics  . Smoking status: Current Every Day Smoker -- 1.00 packs/day    Types: Cigarettes  . Smokeless tobacco: Never Used  . Alcohol Use: 3.6 oz/week    6 Cans of beer per week   OB History    No data available     Review of Systems  Unable to perform ROS: Psychiatric disorder  Cardiovascular: Positive for chest pain.  Musculoskeletal:  Positive for back pain.      Allergies  Review of patient's allergies indicates no known allergies.  Home Medications   Prior to Admission medications   Medication Sig Start Date End Date Taking? Authorizing Provider  benztropine (COGENTIN) 0.5 MG tablet Take 0.5 mg by mouth 2 (two) times daily.    Historical Provider, MD  divalproex (DEPAKOTE ER) 500 MG 24 hr tablet Take 1 tablet (500 mg total) by mouth 2 (two) times daily. 04/02/13   Zannie Cove, MD  risperiDONE (RISPERDAL) 2 MG tablet Take 2 mg by mouth at bedtime.    Historical Provider, MD  risperiDONE microspheres (RISPERDAL CONSTA) 50 MG injection Inject 50 mg into the muscle every 14 (fourteen) days.    Historical Provider, MD   BP 164/96 mmHg  Pulse 94  Temp(Src) 97.8 F (36.6 C) (Oral)  Resp 20  Ht  (1.702 m)  Wt 210 lb (95.255 kg)  BMI 32.88 kg/m2  SpO2 100%  LMP 01/13/2012 Physical Exam  Constitutional: She is oriented to person, place, and time. She appears well-developed and well-nourished. No distress.  HENT:  Head: Normocephalic and atraumatic.  Right Ear: External ear normal.  Left Ear: External ear normal.  Nose: Nose normal.  Mouth/Throat: Oropharynx is clear and moist. No oropharyngeal exudate.  Eyes: Conjunctivae are normal.  Neck: Neck supple.  Cardiovascular: Normal rate, normal heart sounds and intact distal pulses.   Pulmonary/Chest: Effort normal and breath  sounds normal.  Abdominal: Soft. There is no tenderness.  Musculoskeletal: Normal range of motion. She exhibits no edema.       Thoracic back: Normal.       Lumbar back: She exhibits tenderness. She exhibits normal range of motion, no bony tenderness, no deformity and no spasm.  Neurological: She is alert and oriented to person, place, and time.  Skin: Skin is warm and dry. She is not diaphoretic.  Psychiatric: Her mood appears not anxious. Her speech is rapid and/or pressured. She is hyperactive and actively hallucinating. She does not  exhibit a depressed mood. She expresses no homicidal and no suicidal ideation. She expresses no suicidal plans and no homicidal plans.  Nursing note and vitals reviewed.   ED Course  Procedures (including critical care time)  Medications  lidocaine (LIDODERM) 5 % 1 patch (1 patch Transdermal Patch Applied 04/18/14 1949)  acetaminophen (TYLENOL) tablet 650 mg (not administered)  ibuprofen (ADVIL,MOTRIN) tablet 600 mg (not administered)  nicotine (NICODERM CQ - dosed in mg/24 hours) patch 21 mg (not administered)  ondansetron (ZOFRAN) tablet 4 mg (not administered)  alum & mag hydroxide-simeth (MAALOX/MYLANTA) 200-200-20 MG/5ML suspension 30 mL (not administered)  LORazepam (ATIVAN) tablet 1 mg (not administered)  LORazepam (ATIVAN) tablet 0-4 mg (0 mg Oral Hold 04/18/14 2356)    Followed by  LORazepam (ATIVAN) tablet 0-4 mg (not administered)  thiamine (VITAMIN B-1) tablet 100 mg (not administered)    Or  thiamine (B-1) injection 100 mg (not administered)  benztropine (COGENTIN) tablet 0.5 mg (0.5 mg Oral Given 04/18/14 2315)  divalproex (DEPAKOTE ER) 24 hr tablet 500 mg (500 mg Oral Given 04/18/14 2315)  risperiDONE (RISPERDAL) tablet 2 mg (2 mg Oral Given 04/18/14 2315)    Medications  lidocaine (LIDODERM) 5 % 1 patch (1 patch Transdermal Patch Applied 04/18/14 1949)  acetaminophen (TYLENOL) tablet 650 mg (not administered)  ibuprofen (ADVIL,MOTRIN) tablet 600 mg (not administered)  nicotine (NICODERM CQ - dosed in mg/24 hours) patch 21 mg (not administered)  ondansetron (ZOFRAN) tablet 4 mg (not administered)  alum & mag hydroxide-simeth (MAALOX/MYLANTA) 200-200-20 MG/5ML suspension 30 mL (not administered)  LORazepam (ATIVAN) tablet 1 mg (not administered)  LORazepam (ATIVAN) tablet 0-4 mg (0 mg Oral Hold 04/18/14 2356)    Followed by  LORazepam (ATIVAN) tablet 0-4 mg (not administered)  thiamine (VITAMIN B-1) tablet 100 mg (not administered)    Or  thiamine (B-1) injection 100 mg (not  administered)  benztropine (COGENTIN) tablet 0.5 mg (0.5 mg Oral Given 04/18/14 2315)  divalproex (DEPAKOTE ER) 24 hr tablet 500 mg (500 mg Oral Given 04/18/14 2315)  risperiDONE (RISPERDAL) tablet 2 mg (2 mg Oral Given 04/18/14 2315)    Labs Review Labs Reviewed  CBC WITH DIFFERENTIAL/PLATELET - Abnormal; Notable for the following:    Lymphocytes Relative 47 (*)    All other components within normal limits  COMPREHENSIVE METABOLIC PANEL - Abnormal; Notable for the following:    ALT 38 (*)    All other components within normal limits  URINE RAPID DRUG SCREEN (HOSP PERFORMED) - Abnormal; Notable for the following:    Cocaine POSITIVE (*)    All other components within normal limits  URINALYSIS, ROUTINE W REFLEX MICROSCOPIC - Abnormal; Notable for the following:    Ketones, ur 15 (*)    All other components within normal limits  ACETAMINOPHEN LEVEL - Abnormal; Notable for the following:    Acetaminophen (Tylenol), Serum <10.0 (*)    All other components within normal limits  TROPONIN I  PREGNANCY, URINE  BRAIN NATRIURETIC PEPTIDE  ETHANOL  SALICYLATE LEVEL  I-STAT TROPOININ, ED    Imaging Review Dg Chest 2 View  04/18/2014   CLINICAL DATA:  Chest pain  EXAM: CHEST  2 VIEW  COMPARISON:  03/27/2014  FINDINGS: The heart size is at upper limits of normal. Both lungs are clear. The visualized skeletal structures are unremarkable.  IMPRESSION: No active cardiopulmonary disease.   Electronically Signed   By: Christiana PellantGretchen  Green M.D.   On: 04/18/2014 20:15     EKG Interpretation   Date/Time:  Friday April 18 2014 18:33:49 EST Ventricular Rate:  102 PR Interval:  121 QRS Duration: 63 QT Interval:  304 QTC Calculation: 396 R Axis:   74 Text Interpretation:  Sinus tachycardia Biatrial enlargement Borderline  repolarization abnormality Baseline wander in lead(s) V3 V4 No significant  change since last tracing Confirmed by [facility]  MD, Alphonzo LemmingsWHITNEY ([id number]) on  04/18/2014 6:56:33 PM      MDM    Final diagnoses:  Polysubstance abuse  Chest pain, atypical    Filed Vitals:   04/18/14 2355  BP: 164/96  Pulse: 94  Temp:   Resp:    Afebrile, NAD, non-toxic appearing, AAOx4.   I have reviewed nursing notes, vital signs, and all appropriate lab and imaging results for this patient.  1) CP: Patient is to be discharged with recommendation to follow up with PCP in regards to today's hospital visit. Chest pain is not likely of cardiac or pulmonary etiology d/t presentation, perc negative, VSS, no tracheal deviation, no JVD or new murmur, RRR, breath sounds equal bilaterally, EKG without acute abnormalities, negative delta troponin, and negative CXR. Advised to d/c use of cocaine.   2) Substance abuse:  Pt is not currently having SI or HI ideations. TT consult was appreciated and pt was moved to Psych ED for further evaluation of polysubstance abuse requesting detox.   Patient d/w with Dr. Anitra LauthPlunkett, agrees with plan.           Jeannetta EllisJennifer L Jaeleen Inzunza, PA-C 04/19/14 0019  Gwyneth SproutWhitney Plunkett, MD 04/19/14 [phone]

## 2014-04-19 DIAGNOSIS — R0789 Other chest pain: Secondary | ICD-10-CM | POA: Diagnosis not present

## 2014-04-19 NOTE — ED Notes (Signed)
TTS at bedside. 
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
## 2014-04-19 NOTE — Discharge Instructions (Signed)
Use the resources given to help find outpatient detox.     Chest Pain (Nonspecific) It is often hard to give a specific diagnosis for the cause of chest pain. There is always a chance that your pain could be related to something serious, such as a heart attack or a blood clot in the lungs. You need to follow up with your health care provider for further evaluation. CAUSES   Heartburn.  Pneumonia or bronchitis.  Anxiety or stress.  Inflammation around your heart (pericarditis) or lung (pleuritis or pleurisy).  A blood clot in the lung.  A collapsed lung (pneumothorax). It can develop suddenly on its own (spontaneous pneumothorax) or from trauma to the chest.  Shingles infection (herpes zoster virus). The chest wall is composed of bones, muscles, and cartilage. Any of these can be the source of the pain.  The bones can be bruised by injury.  The muscles or cartilage can be strained by coughing or overwork.  The cartilage can be affected by inflammation and become sore (costochondritis). DIAGNOSIS  Lab tests or other studies may be needed to find the cause of your pain. Your health care provider may have you take a test called an ambulatory electrocardiogram (ECG). An ECG records your heartbeat patterns over a 24-hour period. You may also have other tests, such as:  Transthoracic echocardiogram (TTE). During echocardiography, sound waves are used to evaluate how blood flows through your heart.  Transesophageal echocardiogram (TEE).  Cardiac monitoring. This allows your health care provider to monitor your heart rate and rhythm in real time.  Holter monitor. This is a portable device that records your heartbeat and can help diagnose heart arrhythmias. It allows your health care provider to track your heart activity for several days, if needed.  Stress tests by exercise or by giving medicine that makes the heart beat faster. TREATMENT   Treatment depends on what may be causing  your chest pain. Treatment may include:  Acid blockers for heartburn.  Anti-inflammatory medicine.  Pain medicine for inflammatory conditions.  Antibiotics if an infection is present.  You may be advised to change lifestyle habits. This includes stopping smoking and avoiding alcohol, caffeine, and chocolate.  You may be advised to keep your head raised (elevated) when sleeping. This reduces the chance of acid going backward from your stomach into your esophagus. Most of the time, nonspecific chest pain will improve within 2-3 days with rest and mild pain medicine.  HOME CARE INSTRUCTIONS   If antibiotics were prescribed, take them as directed. Finish them even if you start to feel better.  For the next few days, avoid physical activities that bring on chest pain. Continue physical activities as directed.  Do not use any tobacco products, including cigarettes, chewing tobacco, or electronic cigarettes.  Avoid drinking alcohol.  Only take medicine as directed by your health care provider.  Follow your health care provider's suggestions for further testing if your chest pain does not go away.  Keep any follow-up appointments you made. If you do not go to an appointment, you could develop lasting (chronic) problems with pain. If there is any problem keeping an appointment, call to reschedule. SEEK MEDICAL CARE IF:   Your chest pain does not go away, even after treatment.  You have a rash with blisters on your chest.  You have a fever. SEEK IMMEDIATE MEDICAL CARE IF:   You have increased chest pain or pain that spreads to your arm, neck, jaw, back, or abdomen.  You  have shortness of breath.  You have an increasing cough, or you cough up blood.  You have severe back or abdominal pain.  You feel nauseous or vomit.  You have severe weakness.  You faint.  You have chills. This is an emergency. Do not wait to see if the pain will go away. Get medical help at once. Call your  local emergency services (911 in U.S.). Do not drive yourself to the hospital. MAKE SURE YOU:   Understand these instructions.  Will watch your condition.  Will get help right away if you are not doing well or get worse. Document Released: 12/08/2004 Document Revised: 03/05/2013 Document Reviewed: 10/04/2007 [facility] Patient Information 2015 [place], [place]. This information is not intended to replace advice given to you by your health care provider. Make sure you discuss any questions you have with your health care provider.    Polysubstance Abuse When people abuse more than one drug or type of drug it is called polysubstance or polydrug abuse. For example, many smokers also drink alcohol. This is one form of polydrug abuse. Polydrug abuse also refers to the use of a drug to counteract an unpleasant effect produced by another drug. It may also be used to help with withdrawal from another drug. People who take stimulants may become agitated. Sometimes this agitation is countered with a tranquilizer. This helps protect against the unpleasant side effects. Polydrug abuse also refers to the use of different drugs at the same time.  Anytime drug use is interfering with normal living activities, it has become abuse. This includes problems with family and friends. Psychological dependence has developed when your mind tells you that the drug is needed. This is usually followed by physical dependence which has developed when continuing increases of drug are required to get the same feeling or "high". This is known as addiction or chemical dependency. A person's risk is much higher if there is a history of chemical dependency in the family. SIGNS OF CHEMICAL DEPENDENCY  You have been told by friends or family that drugs have become a problem.  You fight when using drugs.  You are having blackouts (not remembering what you do while using).  You feel sick from using drugs but continue using.  You lie  about use or amounts of drugs (chemicals) used.  You need chemicals to get you going.  You are suffering in work performance or in school because of drug use.  You get sick from use of drugs but continue to use anyway.  You need drugs to relate to people or feel comfortable in social situations.  You use drugs to forget problems. "Yes" answered to any of the above signs of chemical dependency indicates there are problems. The longer the use of drugs continues, the greater the problems will become. If there is a family history of drug or alcohol use, it is best not to experiment with these drugs. Continual use leads to tolerance. After tolerance develops more of the drug is needed to get the same feeling. This is followed by addiction. With addiction, drugs become the most important part of life. It becomes more important to take drugs than participate in the other usual activities of life. This includes relating to friends and family. Addiction is followed by dependency. Dependency is a condition where drugs are now needed not just to get high, but to feel normal. Addiction cannot be cured but it can be stopped. This often requires outside help and the care of professionals.  Treatment centers are listed in the yellow pages under: Cocaine, Narcotics, and Alcoholics Anonymous. Most hospitals and clinics can refer you to a specialized care center. Talk to your caregiver if you need help. Document Released: 10/20/2004 Document Revised: 05/23/2011 Document Reviewed: 02/28/2005 [facility] Patient Information 2015 [place], [place]. This information is not intended to replace advice given to you by your health care provider. Make sure you discuss any questions you have with your health care provider.    Emergency Department Resource Guide 1) Find a Doctor and Pay Out of Pocket Although you won't have to find out who is covered by your insurance plan, it is a good idea to ask around and get recommendations.  You will then need to call the office and see if the doctor you have chosen will accept you as a new patient and what types of options they offer for patients who are self-pay. Some doctors offer discounts or will set up payment plans for their patients who do not have insurance, but you will need to ask so you aren't surprised when you get to your appointment.  2) Contact Your Local Health Department Not all health departments have doctors that can see patients for sick visits, but many do, so it is worth a call to see if yours does. If you don't know where your local health department is, you can check in your phone book. The CDC also has a tool to help you locate your state's health department, and many state websites also have listings of all of their local health departments.  3) Find a Walk-in Clinic If your illness is not likely to be very severe or complicated, you may want to try a walk in clinic. These are popping up all over the country in pharmacies, drugstores, and shopping centers. They're usually staffed by nurse practitioners or physician assistants that have been trained to treat common illnesses and complaints. They're usually fairly quick and inexpensive. However, if you have serious medical issues or chronic medical problems, these are probably not your best option.  No Primary Care Doctor: - Call Health Connect at  [phone] - they can help you locate a primary care doctor that  accepts your insurance, provides certain services, etc. - Physician Referral Service- [phone]  Chronic Pain Problems: Organization         Address  Phone   Notes  [facility]  [phone] Patients need to be referred by their primary care doctor.   Medication Assistance: Organization         Address  Phone   Notes  [facility] Medication [facility] [address] [place] [place], [place] [address] [phone] --Must be a resident of [facility] --  Must have NO insurance coverage whatsoever (no Medicaid/ Medicare, etc.) -- The pt. MUST have a primary care doctor that directs their care regularly and follows them in the community   Med[facility]ist  [phone]   [employer]  [phone]    Agencies that provide inexpensive medical care: Organization         Address  Phone   Notes  [facility]  [phone]   [facility]    [phone]   [facility] [address] [place], [place] [address] [phone]   [facility] [address] [place] [place] [phone]   Planned Parenthood    [phone]   [facility]    (  [phone]) [phone]   [facility]  [address], [place] Phone:  [phone]) [phone], Fax:  [phone] Hours of Operation:  9 am - 6 pm, M-F.  Also accepts Medicaid/Medicare and self-pay.  [facility]  [address], [place] Phone: [phone], Fax: [phone]. Hours of Operation:  8:30 am - 5:30 pm, M-F.  Also accepts Medicaid and self-pay.  [facility] [place] [address], [place] Phone: [phone]) [phone]   [facility] [address], [place], [place] [phone], Ext. [phone] Mondays & Thursdays: 7-9 AM.  First 15 patients are seen on a first come, first serve basis.    Medicaid-accepting [facility] [place] Providers:  Organization         Address  Phone   Notes  [facility] [address], [place] [phone] Also accepts self-pay patients.  [facility] [address], [place]  [phone]   [facility] [address], [place] [phone]) [phone]   [facility] [address] [place] [address], [place] [phone]   Renaye RakersVeita Bland [address], [place]   [phone] Only accepts [place] Access [place] patients  after they have their name applied to their card.   Self-Pay (no insurance) in [facility] [place]:  Organization         Address  Phone   Notes  Sickle Cell Patients, [facility] [address], [place] [phone]   [facility] [address], [place] [phone]) [phone]   [facility] [place]  [address], [place] [phone]   [facility]/Dr. Osei-Bonsu  [address] [place] [address], [place] or [phone], [place] [phone] Phone number for both [place] and [place] locations is the same.  [facility] [address], [place] [phone]   [facility] [place] [address] [place] [address], [place] or [address] [phone]) [phone]   [facility] [address], [place] [phone]) [phone], phone; [phone](336) [phone], fax Sees patients 1st and 3rd Saturday of every month.  Must not qualify for public or private insurance (i.e. Medicaid, Medicare, [place] Health Choice, Veterans' Benefits)  Household income should be no more than 200% of the poverty level The clinic cannot treat you if you are pregnant or think you are pregnant  Sexually transmitted diseases are not treated at the clinic.    Dental Care: Organization         Address  Phone  Notes  [facility] of [facility] [address], [place] [phone]) [phone] Accepts children up to age 53 who are enrolled in [place] or [place] Health Choice; pregnant women with a Medicaid card; and children who have applied for Medicaid or [place] Health Choice, but were declined, whose parents can pay a reduced fee at time of service.  [facility] Department of [facility] [place]  [address], [place] [phone] Accepts children up to age 53 who are enrolled in [place] or [place] Health Choice; pregnant women with a Medicaid card; and children  who have applied for Medicaid or [place] Health Choice, but were declined, whose parents can pay a reduced fee at time of service.  [facility]  Access PROGRAM  [place] [phone] Patients are seen by appointment only. Walk-ins are not accepted. [place] will see patients 45 years of age and older. Monday - Tuesday (8am-5pm) Most Wednesdays (8:30-5pm) $30 per visit, cash only  [facility] Adult Dental Access PROGRAM  [address], [facility] [phone] Patients are seen by appointment only. Walk-ins are not accepted. [place] will see patients 43 years of age and older. One Wednesday Evening (Monthly: Volunteer Based).  $30 per visit, cash only  [place]  [phone] for adults; Children under age 59, call Graduate Pediatric Dentistry at [phone]. Children aged 28-14, please call [phone] to request a pediatric application.  Dental services are provided in all areas of dental care including fillings, crowns and bridges, complete and partial dentures, implants, gum treatment, root canals, and extractions. Preventive care is also provided. Treatment is provided to both adults and children. Patients are selected via a lottery and there is often a waiting list.    [facility] [address], [place]  [phone] [email or web address]   [facility] [address] [place], [place] [phone], Ext. [phone] Second and Fourth Thursday of each month, opens at 6:30 AM; Clinic ends at 9 AM.  Patients are seen on a first-come first-served basis, and a limited number are seen during each clinic.   [facility]  [address] Hillard Danker [place], [place] [phone]   Eligibility Requirements You must have lived in [place], [place], or [place] counties for at least the last three months.   You cannot be eligible for state or federal sponsored [employer], including [employer], [place], or [employer].   You generally cannot be eligible for healthcare insurance through your employer.    How to apply: Eligibility screenings are held every Tuesday and Wednesday afternoon from 1:00 pm until 4:00 pm. You do not need an appointment for the interview!  [facility] [address] [place], [place]   [place]  [place] Department  Welcome  [phone]    Behavioral Health Resources in the Community: Intensive Outpatient Programs Organization         Address  Phone  Notes  Harriman Cambria. [address] [place], [place] [phone]   [facility] Outpatient [address], [place], [place]   ADS: Alcohol & Drug Svcs [address] [place], [place]   [place] [address]  [place], [place] or [phone]   Substance Abuse Resources Organization         Address  Phone  Notes  Alcohol and Drug Services  7373524702   [place]  [phone]   The [place]   Chinita Pester  [phone]   Residential & Outpatient Substance Abuse Program  [phone]   Psychological Services Organization         Address  Phone  Notes  [facility] [place]  [place]  [phone]   [place] [address], [place] or [phone]    Mobile Crisis Teams Organization         Address  Phone  Notes  [facility], Mobile Crisis Care Unit  [phone]   [facility]  [address] [place], [place]  [phone]   [facility] [address] [place] [phone]    Self-Help/Support Groups Organization         Address  Phone             Notes  Mental Health Assoc. of [place] -  variety of support groups  [phone]- I7437963 Call for more information  Narcotics Anonymous (NA), Caring [facility] [address], [employer] [place]  2 meetings at this location   Statistician         Address  Phone  Notes  [facility] [address],    [place]  [phone]   [facility]  [address], [place] [address], [place], [place] [phone]   [facility] [address] [place], [place] [phone] Admissions: 8am-3pm M-F  [facility] [address],    [place], [place] [phone]   [facility] [address] [place], [place], [place] [phone]   [facility] [address]  [place], [place] [phone]   [facility] - Intensive Outpatient [address], [place], [place] [phone]   [facility] ([facility]iction [facility] [address]  [place], [place] [phone] or [phone]   [facility] (RTS) [address], [place], [place] [phone] Accepts Medicaid  [facility] [place] [address]  [place] [phone] Substance Abuse/Addiction Treatment   [facility] Organization         Address  Phone  Notes  [facility]  [phone]   Angie Fava, PhD [address] [place], [place]   [phone] or [phone]   [facility]   [address] [place], [place] [phone]   [facility] [address] [place], [place] [phone] Insurance/Medicaid/sponsorship through [facility] [place] and Families [address]                                    [place], [place] [phone] Therapy/tele-psych/case  [facility] [address], [place] [phone]    Dr. Lolly Mustache  [phone]   [facility]  [employer] [facility] Dept. 1) [address] [place] [address], [place] 2) [address] [place] 3)  [address], [place] [phone]  [phone]   [facility] Child Abuse Hotline [phone] or [phone] (After Hours)

## 2014-04-19 NOTE — ED Provider Notes (Signed)
Patient has been evaluated by behavioral health, stable for discharge with outpatient resources.   [name] T Callen Vancuren, MD 04/19/14 [phone]

## 2014-08-27 ENCOUNTER — Emergency Department (HOSPITAL_COMMUNITY): Payer: Medicare Other

## 2014-08-27 ENCOUNTER — Emergency Department (HOSPITAL_COMMUNITY)
Admission: EM | Admit: 2014-08-27 | Discharge: 2014-08-28 | Disposition: A | Discharge status: Home / Self Care | Payer: Medicare Other | Attending: Emergency Medicine | Admitting: Emergency Medicine

## 2014-08-27 ENCOUNTER — Encounter (HOSPITAL_COMMUNITY): Payer: Self-pay

## 2014-08-27 DIAGNOSIS — Z792 Long term (current) use of antibiotics: Secondary | ICD-10-CM | POA: Insufficient documentation

## 2014-08-27 DIAGNOSIS — F419 Anxiety disorder, unspecified: Secondary | ICD-10-CM | POA: Insufficient documentation

## 2014-08-27 DIAGNOSIS — F209 Schizophrenia, unspecified: Secondary | ICD-10-CM | POA: Diagnosis not present

## 2014-08-27 DIAGNOSIS — G8929 Other chronic pain: Secondary | ICD-10-CM | POA: Diagnosis not present

## 2014-08-27 DIAGNOSIS — R300 Dysuria: Secondary | ICD-10-CM | POA: Insufficient documentation

## 2014-08-27 DIAGNOSIS — R3915 Urgency of urination: Secondary | ICD-10-CM | POA: Insufficient documentation

## 2014-08-27 DIAGNOSIS — F250 Schizoaffective disorder, bipolar type: Secondary | ICD-10-CM | POA: Diagnosis not present

## 2014-08-27 DIAGNOSIS — E669 Obesity, unspecified: Secondary | ICD-10-CM | POA: Diagnosis not present

## 2014-08-27 DIAGNOSIS — R1030 Lower abdominal pain, unspecified: Secondary | ICD-10-CM | POA: Insufficient documentation

## 2014-08-27 DIAGNOSIS — R350 Frequency of micturition: Secondary | ICD-10-CM | POA: Diagnosis not present

## 2014-08-27 DIAGNOSIS — M549 Dorsalgia, unspecified: Secondary | ICD-10-CM | POA: Insufficient documentation

## 2014-08-27 DIAGNOSIS — G40909 Epilepsy, unspecified, not intractable, without status epilepticus: Secondary | ICD-10-CM | POA: Diagnosis not present

## 2014-08-27 DIAGNOSIS — F319 Bipolar disorder, unspecified: Secondary | ICD-10-CM | POA: Insufficient documentation

## 2014-08-27 DIAGNOSIS — Z79899 Other long term (current) drug therapy: Secondary | ICD-10-CM | POA: Insufficient documentation

## 2014-08-27 LAB — URINALYSIS, ROUTINE W REFLEX MICROSCOPIC
GLUCOSE, UA: NEGATIVE mg/dL
Hgb urine dipstick: NEGATIVE
KETONES UR: NEGATIVE mg/dL
NITRITE: NEGATIVE
PH: 5.5 (ref 5.0–8.0)
Protein, ur: NEGATIVE mg/dL
SPECIFIC GRAVITY, URINE: 1.028 (ref 1.005–1.030)
Urobilinogen, UA: 1 mg/dL (ref 0.0–1.0)

## 2014-08-27 LAB — URINE MICROSCOPIC-ADD ON

## 2014-08-27 LAB — COMPREHENSIVE METABOLIC PANEL
ALBUMIN: 3.5 g/dL (ref 3.5–5.0)
ALT: 20 U/L (ref 14–54)
AST: 16 U/L (ref 15–41)
Alkaline Phosphatase: 58 U/L (ref 38–126)
Anion gap: 3 — ABNORMAL LOW (ref 5–15)
BUN: 11 mg/dL (ref 6–20)
CALCIUM: 8.6 mg/dL — AB (ref 8.9–10.3)
CHLORIDE: 104 mmol/L (ref 101–111)
CO2: 29 mmol/L (ref 22–32)
CREATININE: 0.79 mg/dL (ref 0.44–1.00)
GFR calc Af Amer: >60 mL/min (ref >60)
GLUCOSE: 117 mg/dL — AB (ref 65–99)
Potassium: 3.7 mmol/L (ref 3.5–5.1)
Sodium: 136 mmol/L (ref 135–145)
Total Bilirubin: 0.3 mg/dL (ref 0.3–1.2)
Total Protein: 6.9 g/dL (ref 6.5–8.1)

## 2014-08-27 LAB — CBC WITH DIFFERENTIAL/PLATELET
BASOS ABS: 0 10*3/uL (ref 0.0–0.1)
Basophils Relative: 1 % (ref 0–1)
EOS ABS: 0.1 10*3/uL (ref 0.0–0.7)
Eosinophils Relative: 1 % (ref 0–5)
HCT: 39.2 % (ref 36.0–46.0)
Hemoglobin: 12.7 g/dL (ref 12.0–15.0)
LYMPHS ABS: 2.6 10*3/uL (ref 0.7–4.0)
Lymphocytes Relative: 45 % (ref 12–46)
MCH: 28.6 pg (ref 26.0–34.0)
MCHC: 32.4 g/dL (ref 30.0–36.0)
MCV: 88.3 fL (ref 78.0–100.0)
Monocytes Absolute: 0.4 10*3/uL (ref 0.1–1.0)
Monocytes Relative: 6 % (ref 3–12)
NEUTROS PCT: 47 % (ref 43–77)
Neutro Abs: 2.7 10*3/uL (ref 1.7–7.7)
PLATELETS: 258 10*3/uL (ref 150–400)
RBC: 4.44 MIL/uL (ref 3.87–5.11)
RDW: 14.3 % (ref 11.5–15.5)
WBC: 5.8 10*3/uL (ref 4.0–10.5)

## 2014-08-27 LAB — RAPID URINE DRUG SCREEN, HOSP PERFORMED
Amphetamines: NOT DETECTED
Barbiturates: NOT DETECTED
Benzodiazepines: NOT DETECTED
Cocaine: NOT DETECTED
Opiates: NOT DETECTED
Tetrahydrocannabinol: NOT DETECTED

## 2014-08-27 LAB — VALPROIC ACID LEVEL: Valproic Acid Lvl: 18 ug/mL — ABNORMAL LOW (ref 50.0–100.0)

## 2014-08-27 LAB — ACETAMINOPHEN LEVEL

## 2014-08-27 LAB — SALICYLATE LEVEL

## 2014-08-27 LAB — ETHANOL: Alcohol, Ethyl (B): <5 mg/dL (ref <5)

## 2014-08-27 MED ORDER — AMANTADINE HCL 100 MG PO TABS: 100.0000 mg | ORAL_TABLET | Freq: Two times a day (BID) | ORAL | Status: DC

## 2014-08-27 MED ORDER — RISPERIDONE 2 MG PO TABS
2.0000 mg | ORAL_TABLET | Freq: Two times a day (BID) | ORAL | Status: DC
  Administered 2014-08-28: 2 mg via ORAL
  Filled 2014-08-27: qty 1

## 2014-08-27 MED ORDER — HYDROXYZINE PAMOATE 25 MG PO CAPS: 25.0000 mg | ORAL_CAPSULE | Freq: Two times a day (BID) | ORAL | Status: DC

## 2014-08-27 MED ORDER — DIVALPROEX SODIUM ER 500 MG PO TB24
500.0000 mg | ORAL_TABLET | Freq: Two times a day (BID) | ORAL | Status: DC
  Administered 2014-08-28 (×2): 500 mg via ORAL
  Filled 2014-08-27 (×3): qty 1

## 2014-08-27 MED ORDER — IBUPROFEN 200 MG PO TABS
600.0000 mg | ORAL_TABLET | Freq: Once | ORAL | Status: AC
  Administered 2014-08-27: 600 mg via ORAL
  Filled 2014-08-27: qty 3

## 2014-08-27 MED ORDER — QUETIAPINE FUMARATE 50 MG PO TABS
50.0000 mg | ORAL_TABLET | Freq: Every day | ORAL | Status: DC
  Administered 2014-08-28: 50 mg via ORAL
  Filled 2014-08-27: qty 1

## 2014-08-27 MED ORDER — HYDROXYZINE HCL 25 MG PO TABS
25.0000 mg | ORAL_TABLET | Freq: Two times a day (BID) | ORAL | Status: DC
  Administered 2014-08-28 (×2): 25 mg via ORAL
  Filled 2014-08-27 (×2): qty 1

## 2014-08-27 MED ORDER — AMANTADINE HCL 100 MG PO CAPS
100.0000 mg | ORAL_CAPSULE | Freq: Two times a day (BID) | ORAL | Status: DC
  Administered 2014-08-28 (×2): 100 mg via ORAL
  Filled 2014-08-27 (×3): qty 1

## 2014-08-27 MED ORDER — RISPERIDONE MICROSPHERES 50 MG IM SUSR: 50.0000 mg | INTRAMUSCULAR | Status: DC

## 2014-08-27 MED ORDER — IBUPROFEN 800 MG PO TABS
800.0000 mg | ORAL_TABLET | Freq: Once | ORAL | Status: DC
Start: 2014-08-27 — End: 2014-08-27

## 2014-08-27 NOTE — ED Notes (Addendum)
Pt transported by [facility] with c/o lower abdominal pain, lower back pain, and urinary frequency x few days.  Pt reported to [facility] that she consumed one beer today.  Pt reported to EMS that she has no place to live and is out of money.   Pt requesting to go to rehab for alcohol abuse.

## 2014-08-27 NOTE — ED Notes (Signed)
Pt sleeping. 

## 2014-08-27 NOTE — Progress Notes (Addendum)
Pt frequently at ed nursing station asking for phone to call her [place] DSS case worker to discuss her "finances" states number is [phone] Pt provided an operational phone to call DSS 1640 Pt noted to be eating a sandwich when Cm provided her the phone Pt now at nursing station requesting food Stating she had not eaten in 3 days.  Reports DSS case worker will see her on 08/28/14
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
## 2014-08-27 NOTE — ED Notes (Signed)
Bed: LI10 Expected date:  Expected time:  Means of arrival:  Comments: EMS- 54yo F, UTI?, ETOH

## 2014-08-27 NOTE — ED Provider Notes (Signed)
CSN: [id number]     Arrival date & time 08/27/14  1426 History   First MD Initiated Contact with Patient 08/27/14 1504     Chief Complaint  Patient presents with  . Abdominal Pain    Lower abd pain, lower back pain, urinary frequency x several days.      (Consider location/radiation/quality/duration/timing/severity/associated sxs/prior Treatment) HPI [name] is a 54 y.o. female with hx of bipolar disorder, schizo-affective schizophrenia, polysubstance abuse, alcohol abuse, seizures, presents to ED with complaint of back pain, urinary frequency, requesting substance detox. Pt states urinary symptoms for several days. States constant urge to urinate and dysuria. Denies fever or chills. States back pain is chronic, has been applying bengay with no relief. No pain radiation down extremities, no numbness or weakness in extremities. Pt states she went to her psychiatrist today who gave her a "shot." states she was sent here for detox. Last drink earlier this morning, reports drank 1 beer. No SI or HI.   Past Medical History  Diagnosis Date  . Arthritis   . Seizures   . Psychiatric illness   . Bipolar 1 disorder   . Schizo-affective schizophrenia   . Obesity   . Substance abuse    Past Surgical History  Procedure Laterality Date  . Stomach surgery     Family History  Problem Relation Age of Onset  . Alzheimer's disease Mother   . Depression Mother    History  Substance Use Topics  . Smoking status: Current Every Day Smoker -- 1.00 packs/day    Types: Cigarettes  . Smokeless tobacco: Never Used  . Alcohol Use: 3.6 oz/week    6 Cans of beer per week   OB History    No data available     Review of Systems  Constitutional: Negative for fever and chills.  Respiratory: Negative for cough, chest tightness and shortness of breath.   Cardiovascular: Negative for chest pain, palpitations and leg swelling.  Gastrointestinal: Positive for abdominal pain. Negative for nausea,  vomiting and diarrhea.  Genitourinary: Positive for dysuria, urgency and frequency. Negative for flank pain, vaginal bleeding, vaginal discharge, vaginal pain and pelvic pain.  Musculoskeletal: Positive for back pain. Negative for myalgias, arthralgias, neck pain and neck stiffness.  Skin: Negative for rash.  Neurological: Negative for dizziness, weakness and headaches.  Psychiatric/Behavioral: Positive for behavioral problems. The patient is nervous/anxious.   All other systems reviewed and are negative.     Allergies  Review of patient's allergies indicates no known allergies.  Home Medications   Prior to Admission medications   Medication Sig Start Date End Date Taking? Authorizing Provider  amantadine (SYMMETREL) 100 MG capsule Take 100 mg by mouth 2 (two) times daily.   Yes Historical Provider, MD  Amantadine HCl 100 MG tablet Take 100 mg by mouth 2 (two) times daily.   Yes Historical Provider, MD  divalproex (DEPAKOTE ER) 500 MG 24 hr tablet Take 1 tablet (500 mg total) by mouth 2 (two) times daily. Patient taking differently: Take 1,000 mg by mouth 2 (two) times daily.  04/02/13  Yes Zannie Cove, MD  hydrOXYzine (VISTARIL) 25 MG capsule Take 25 mg by mouth 2 (two) times daily.   Yes Historical Provider, MD  QUEtiapine (SEROQUEL) 50 MG tablet Take 50 mg by mouth at bedtime.   Yes Historical Provider, MD  risperiDONE (RISPERDAL) 2 MG tablet Take 2-4 mg by mouth 2 (two) times daily. Take 1 tablet in the morning and 2 tablets with supper  Yes Historical Provider, MD  risperiDONE microspheres (RISPERDAL CONSTA) 50 MG injection Inject 50 mg into the muscle every 14 (fourteen) days.   Yes Historical Provider, MD   BP 144/95 mmHg  Pulse 102  Temp(Src) 98.5 F (36.9 C) (Oral)  Resp 16  Ht  (1.702 m)  Wt 220 lb (99.791 kg)  BMI 34.45 kg/m2  SpO2 97%  LMP 01/13/2012 Physical Exam  Constitutional: She is oriented to person, place, and time. She appears well-developed and  well-nourished. No distress.  HENT:  Head: Normocephalic.  Eyes: Conjunctivae are normal.  Neck: Normal range of motion. Neck supple.  Cardiovascular: Normal rate, regular rhythm and normal heart sounds.   Pulmonary/Chest: Effort normal and breath sounds normal. No respiratory distress. She has no wheezes. She has no rales.  Abdominal: Soft. Bowel sounds are normal. She exhibits no distension. There is no tenderness. There is no rebound and no guarding.  No cva tenderness bilaterally  Musculoskeletal: She exhibits no edema.  No midline lumbar spine tenderness. Diffuse perispinal muscle tenderness  Neurological: She is alert and oriented to person, place, and time.  5/5 and equal lower extremity strength. 2+ and equal patellar reflexes bilaterally. Pt able to dorsiflex bilateral toes and feet with good strength against resistance. Equal sensation bilaterally over thighs and lower legs.   Skin: Skin is warm and dry.  Psychiatric: She has a normal mood and affect. Her behavior is normal.  Nursing note and vitals reviewed.   ED Course  Procedures (including critical care time) Labs Review Labs Reviewed  COMPREHENSIVE METABOLIC PANEL - Abnormal; Notable for the following:    Glucose, Bld 117 (*)    Calcium 8.6 (*)    Anion gap 3 (*)    All other components within normal limits  URINALYSIS, ROUTINE W REFLEX MICROSCOPIC (NOT AT [facility]) - Abnormal; Notable for the following:    Color, Urine AMBER (*)    APPearance CLOUDY (*)    Bilirubin Urine SMALL (*)    Leukocytes, UA SMALL (*)    All other components within normal limits  URINE MICROSCOPIC-ADD ON - Abnormal; Notable for the following:    Squamous Epithelial / LPF MANY (*)    Crystals CA OXALATE CRYSTALS (*)    All other components within normal limits  VALPROIC ACID LEVEL - Abnormal; Notable for the following:    Valproic Acid Lvl 18 (*)    All other components within normal limits  ACETAMINOPHEN LEVEL - Abnormal; Notable for the  following:    Acetaminophen (Tylenol), Serum <10 (*)    All other components within normal limits  CBC WITH DIFFERENTIAL/PLATELET  URINE RAPID DRUG SCREEN, HOSP PERFORMED  ETHANOL  SALICYLATE LEVEL    Imaging Review Ct Renal Stone Study  08/27/2014   CLINICAL DATA:  Lower abdominal pain and low back pain. Urinary frequency.  EXAM: CT ABDOMEN AND PELVIS WITHOUT CONTRAST  TECHNIQUE: Multidetector CT imaging of the abdomen and pelvis was performed following the standard protocol without IV contrast.  COMPARISON:  CT scan dated 02/08/2008  FINDINGS: Lower chest:  Normal.  Hepatobiliary: Normal.  Pancreas: Normal.  Spleen: Normal.  Adrenals/Urinary Tract: Normal.  Stomach/Bowel: There are few diverticula in the descending colon. Appendix appears to have been removed. Small hiatal hernia. Otherwise normal.  Vascular/Lymphatic: There are few small periaortic lymph nodes, diminished in size since the prior study. No visible atherosclerotic disease of the abdominal aorta.  Reproductive: Uterus and left ovary are normal. Right ovary appears to have been removed.  Musculoskeletal: Severe bilateral facet arthritis at L4-5 with a small broad-based bulge of the disc.  IMPRESSION: Severe bilateral facet arthritis at L4-5, right worse than left. Minimal diverticulosis of the descending colon.   Electronically Signed   By: Francene Boyers M.D.   On: 08/27/2014 17:40     EKG Interpretation None      MDM   Final diagnoses:  Back pain   Pt with polysubstance abuse, alcohol abuse, schizophrenia. Complaining of dysuria and chronic back pain. No red flags on exam. Will get UA, labs, ibuprofen ordered for pain.    6:05 PM Patient is medically cleared. No evidence of UTI or stone. Degenerative changes in her spine which is most likely why she is having pain. When I walked in the room to recheck on her, patient sitting with a handful of pills, I asked her what those pills were she said they were her  medications,  however she is unable to tell me what they are. She has a bag with bottles nearby. I advised her not to take them. Patient apparently was sent here from her psychiatrist's office for detox. Will get TTS to assess her from psychiatric standpoint and from detox.  Patient was seen by TTS, they will observe her until morning to be seen by psychiatrist.  Jaynie Crumble, PA-C 08/28/14 1947  Doug Sou, MD 08/29/14 1610

## 2014-08-27 NOTE — ED Provider Notes (Signed)
Patient complains of back pain bilateral back pain for several months. Unchanged today. She also reports she wants help with alcohol and cocaine problem she last used cocaine one week ago she's alcohol 3 days ago. She denies homicidal or suicidal ideation. She denies abdominal pain. Ms [name] witnessed her wanting to swallow prospect 20 pills at once patient stated it was her own medicine. She will be observed in the ED overnight  Doug Sou, MD 08/28/14 0104

## 2014-08-28 DIAGNOSIS — F250 Schizoaffective disorder, bipolar type: Secondary | ICD-10-CM | POA: Diagnosis not present

## 2014-08-28 DIAGNOSIS — M549 Dorsalgia, unspecified: Secondary | ICD-10-CM | POA: Diagnosis not present

## 2014-08-28 DIAGNOSIS — F209 Schizophrenia, unspecified: Secondary | ICD-10-CM | POA: Insufficient documentation

## 2014-08-28 MED ORDER — RISPERIDONE 2 MG PO TABS
2.0000 mg | ORAL_TABLET | Freq: Once | ORAL | Status: AC
  Administered 2014-08-28: 2 mg via ORAL
  Filled 2014-08-28: qty 1

## 2014-08-28 MED ORDER — METRONIDAZOLE 500 MG PO TABS
2000.0000 mg | ORAL_TABLET | Freq: Once | ORAL | Status: AC
  Administered 2014-08-28: 2000 mg via ORAL
  Filled 2014-08-28: qty 4

## 2014-08-28 MED ORDER — RISPERIDONE 2 MG PO TABS: 2.0000 mg | ORAL_TABLET | Freq: Two times a day (BID) | ORAL | Status: DC

## 2014-08-28 NOTE — BH Assessment (Signed)
Patient ready for discharge. The guardian [name] # [phone] is aware of discharge. The guardian was also made aware that patient will need to to be picked up and placed in her care.  The guardian agreed to pick patient up at 2:30pm. The guardian called back stating she she discussed with her supervisor patient discharging patient to ride the bus. The guardian sts that she is ok with patient utilizing public transportation and patient knows how to ride the bus. Furthermore she sts, "She rides the bus all the time and her home is within bus route".    Writer also confirmed that patient is able to utilize the [facility] bus system with her outpatient provider "Vikki Ports" from [facility].

## 2014-08-28 NOTE — ED Notes (Signed)
Written dc instructions reviewed w/ pt.  Pt encouraged to take her medications as directed, keep her appt's as scheduled.  Pt verbalized understanding.  Pt ambulatory w/o difficulty to dc window w/ mHt, belongings returned after leaving the unit.

## 2014-08-28 NOTE — ED Notes (Signed)
Guardian will pick her up at 1430

## 2014-08-28 NOTE — ED Notes (Addendum)
Per [place] TTS, [name] is not able to pick her up and reports that the pt is able to ride the bus home OK to dc home via the bus, bus pass given

## 2014-08-28 NOTE — ED Notes (Signed)
Pt presented with complaint of abd. Pain, rated as 10 on pain scale, requesting detox from alcohol.  Denies SI, HI or AV hallucinations.  Reports diagnosed with Bipolar, Schizophrenia.  AAO x 3, cooperative, loud and boisterous.  Monitoring for safety, Q 15 min checks in effect.

## 2014-08-28 NOTE — [facility] Suicide Risk Assessment (Cosign Needed)
Suicide Risk Assessment  Discharge Assessment   [facility] Discharge Suicide Risk Assessment   Demographic Factors:  Low socioeconomic status, Living alone and Unemployed  Total Time spent with patient: 20 minutes  Musculoskeletal: Strength & Muscle Tone: within normal limits Gait & Station: normal Patient leans: N/A  Psychiatric Specialty Exam:     Blood pressure 159/102, pulse 102, temperature 97.5 F (36.4 C), temperature source Oral, resp. rate 20, height 5\' 7"  (1.702 m), weight 99.791 kg (220 lb), last menstrual period 01/13/2012, SpO2 100 %.Body mass index is 34.45 kg/(m^2).  General Appearance: Casual  Eye Contact::  Good  Speech:  Clear and Coherent and Normal Rate  Volume:  Normal  Mood:  Anxious  Affect:  Congruent  Thought Process:  Coherent, Goal Directed and Intact  Orientation:  Full (Time, Place, and Person)  Thought Content:  WDL  Suicidal Thoughts:  No  Homicidal Thoughts:  No  Memory:  Immediate;   Good Recent;   Good Remote;   Good  Judgement:  Fair  Insight:  Fair  Psychomotor Activity:  Normal  Concentration:  Good  Recall:  NA  Fund of Knowledge:Fair  Language: Good  Akathisia:  NA  Handed:  Right  AIMS (if indicated):     Assets:  Desire for Improvement  ADL's:  Intact  Cognition: WNL        Has this patient used any form of tobacco in the last 30 days? (Cigarettes, Smokeless Tobacco, Cigars, and/or Pipes) Yes, A prescription for an FDA-approved tobacco cessation medication was offered at discharge and the patient refused  Mental Status Per Nursing Assessment::   On Admission:     Current Mental Status by Physician: NA  Loss Factors: NA  Historical Factors: Prior suicide attempts  Risk Reduction Factors:   Religious beliefs about death and Positive social support  Continued Clinical Symptoms:  Bipolar Disorder:   Mixed State  Cognitive Features That Contribute To Risk:  Polarized thinking    Suicide Risk:  Minimal: No  identifiable suicidal ideation.  Patients presenting with no risk factors but with morbid ruminations; may be classified as minimal risk based on the severity of the depressive symptoms  Principal Problem: Schizoaffective disorder, bipolar type Discharge Diagnoses:  Patient Active Problem List   Diagnosis Date Noted  . Schizoaffective disorder, bipolar type [F25.0]     Priority: High  . Schizophrenia, unspecified type [F20.9]   . Suicidal ideations [R45.851]   . Schizoaffective disorder [F25.9] 03/29/2013  . Pneumonia [J18.9] 03/29/2013  . Cocaine abuse [F14.10] 06/30/2011  . Non-compliant patient [Z91.19] 06/29/2011  . Hypertension [I10] 06/29/2011  . Alcohol dependence [F10.20] 06/28/2011  . Polysubstance dependence [F19.20] 04/03/2011  . Polysubstance abuse [F19.10] 06/21/2010      Plan Of Care/Follow-up recommendations:  Activity:  as tolerated Diet:  regular  Is patient on multiple antipsychotic therapies at discharge:  No   Has Patient had three or more failed trials of antipsychotic monotherapy by history:  No  Recommended Plan for Multiple Antipsychotic Therapies: NA    Naylee Frankowski C   PMHNP-BC 08/28/2014, 2:27 PM

## 2014-08-28 NOTE — BH Assessment (Signed)
Patient to discharge home per Julieanne Cotton, NP and Dr. Jannifer Franklin. Patient to follow up with [facility] where she receives medication management only. She has an appointment scheduled at [facility] Wednesday, September 10, 2014 @ 12:30pm . Patient receives ACT services with [facility]. Writer contacted the provider and spoke to [place]. She verified that patient does not receive ACT services. She sts that patient is participating in the [facility] program (daily). Vikki Ports describes patient's behaviors as "Sporatic, from one extreme to another, sometimes bizarre, and inappropriate".  She reports that patient often has bad judgement and insight.Writer shared information with Julieanne Cotton, NP and she will continue with plan to discharge. Furthermore, Vikki Ports explains that due to patient's history and mental health illness she has a payee/guardian [name] # [phone]. Writer contacted guardian and she will be here to pick patient up from [facility] approx. 2:30pm. Sts she will take patient to her home where she lives.

## 2014-08-28 NOTE — ED Notes (Signed)
Pt. and belongings wanded by security 

## 2014-08-28 NOTE — Consult Note (Signed)
[facility] Consult   Reason for Consult:  Schizoaffective disorder, Bipolar Mixed Referring Physician:  EDP Patient Identification: [name] MRN:  [id number] Principal Diagnosis: Schizoaffective disorder, bipolar type Diagnosis:   Patient Active Problem List   Diagnosis Date Noted  . Schizoaffective disorder, bipolar type [F25.0]     Priority: High  . Suicidal ideations [R45.851]   . Schizoaffective disorder [F25.9] 03/29/2013  . Pneumonia [J18.9] 03/29/2013  . Cocaine abuse [F14.10] 06/30/2011  . Non-compliant patient [Z91.19] 06/29/2011  . Hypertension [I10] 06/29/2011  . Alcohol dependence [F10.20] 06/28/2011  . Polysubstance dependence [F19.20] 04/03/2011  . Polysubstance abuse [F19.10] 06/21/2010    Total Time spent with patient: 1 hour  Subjective:   [name] is a 54 y.o. female patient admitted with Schizoaffective disorder, Bipolar, mixed.  HPI: AA female was evaluated for help with Alcohol detox when her Alcohol level is negative.  Patient came in last night and stated that she needed Alcohol detox and that she is homeless.  She admitted to a Diagnosis of Bipolar disorder.  This morning patient denied SI/HI/AVH while she had informed staff members that she was hearing voices and seeing things. She was smiling while speaking with providers and wanted to go home.  Patient sees a Teacher, music at [facility] for her medication management [facility]  Patient denies alcohol and drug use but admitted to previous addiction.  Patient is alert and oriented x 3.  She reports good sleep and appetite.  Please see TTS note for collateral information.  Patient is discharged home.  HPI Elements:   Location:  Schizoaffective disorder, bipolar mixed. Quality:  severe-moderate. Severity:  moderate-severe. Timing:  acute. Duration:  Chronic mental illness.. Context:  seeking detox treatment..  Past Medical History:   Past Medical History  Diagnosis Date  . Arthritis   . Seizures   . Psychiatric illness   . Bipolar 1 disorder   . Schizo-affective schizophrenia   . Obesity   . Substance abuse     Past Surgical History  Procedure Laterality Date  . Stomach surgery     Family History:  Family History  Problem Relation Age of Onset  . Alzheimer's disease Mother   . Depression Mother    Social History:  History  Alcohol Use  . 3.6 oz/week  . 6 Cans of beer per week     History  Drug Use  . 5.00 per week  . Special: "Crack" cocaine, Cocaine    Comment: crack/cocaine    History   Social History  . Marital Status: Single    Spouse Name: N/A  . Number of Children: N/A  . Years of Education: N/A   Social History Main Topics  . Smoking status: Current Every Day Smoker -- 1.00 packs/day    Types: Cigarettes  . Smokeless tobacco: Never Used  . Alcohol Use: 3.6 oz/week    6 Cans of beer per week  . Drug Use: 5.00 per week    Special: "Crack" cocaine, Cocaine     Comment: crack/cocaine  . Sexual Activity: No   Other Topics Concern  . None   Social History Narrative   Additional Social History:    Pain Medications: Pt denies Prescriptions: Pt denies  Over the Counter: Pt denies History of alcohol / drug use?: Yes Longest period of sobriety (when/how long): Unknown Negative Consequences of Use: Financial, Legal, Personal relationships, Work / School Withdrawal Symptoms: Agitation, Irritability, Weakness Name of Substance  1: Alcohol 1 - Age of First Use: Unknown 1 - Amount (size/oz): "as much as I can" 1 - Frequency: daily 1 - Duration: ongoing 1 - Last Use / Amount: 08/26/14 Name of Substance 2: Crack cocaine 2 - Age of First Use: unknown 2 - Amount (size/oz): unknown 2 - Frequency: occasional 2 - Duration: ongoing 2 - Last Use / Amount: 07/27/14                 Allergies:  No Known Allergies  Labs:  Results for orders placed or performed during the hospital  encounter of 08/27/14 (from the past 48 hour(s))  Urinalysis, Routine w reflex microscopic (not at [facility])     Status: Abnormal   Collection Time: 08/27/14  3:27 PM  Result Value Ref Range   Color, Urine AMBER (A) YELLOW    Comment: BIOCHEMICALS MAY BE AFFECTED BY COLOR   APPearance CLOUDY (A) CLEAR   Specific Gravity, Urine 1.028 1.005 - 1.030   pH 5.5 5.0 - 8.0   Glucose, UA NEGATIVE NEGATIVE mg/dL   Hgb urine dipstick NEGATIVE NEGATIVE   Bilirubin Urine SMALL (A) NEGATIVE   Ketones, ur NEGATIVE NEGATIVE mg/dL   Protein, ur NEGATIVE NEGATIVE mg/dL   Urobilinogen, UA 1.0 0.0 - 1.0 mg/dL   Nitrite NEGATIVE NEGATIVE   Leukocytes, UA SMALL (A) NEGATIVE  Urine rapid drug screen (hosp performed)     Status: None   Collection Time: 08/27/14  3:27 PM  Result Value Ref Range   Opiates NONE DETECTED NONE DETECTED   Cocaine NONE DETECTED NONE DETECTED   Benzodiazepines NONE DETECTED NONE DETECTED   Amphetamines NONE DETECTED NONE DETECTED   Tetrahydrocannabinol NONE DETECTED NONE DETECTED   Barbiturates NONE DETECTED NONE DETECTED    Comment:        DRUG SCREEN FOR MEDICAL PURPOSES ONLY.  IF CONFIRMATION IS NEEDED FOR ANY PURPOSE, NOTIFY LAB WITHIN 5 DAYS.        LOWEST DETECTABLE LIMITS FOR URINE DRUG SCREEN Drug Class       Cutoff (ng/mL) Amphetamine      1000 Barbiturate      200 Benzodiazepine   983 Tricyclics       382 Opiates          300 Cocaine          300 THC              50   Urine microscopic-add on     Status: Abnormal   Collection Time: 08/27/14  3:27 PM  Result Value Ref Range   Squamous Epithelial / LPF MANY (A) RARE   WBC, UA 0-2 <3 WBC/hpf   Crystals CA OXALATE CRYSTALS (A) NEGATIVE   Urine-Other MUCOUS PRESENT     Comment: TRICHOMONAS PRESENT  CBC with Differential     Status: None   Collection Time: 08/27/14  3:41 PM  Result Value Ref Range   WBC 5.8 4.0 - 10.5 K/uL   RBC 4.44 3.87 - 5.11 MIL/uL   Hemoglobin 12.7 12.0 - 15.0 g/dL   HCT 39.2 36.0 -  46.0 %   MCV 88.3 78.0 - 100.0 fL   MCH 28.6 26.0 - 34.0 pg   MCHC 32.4 30.0 - 36.0 g/dL   RDW 14.3 11.5 - 15.5 %   Platelets 258 150 - 400 K/uL   Neutrophils Relative % 47 43 - 77 %   Neutro Abs 2.7 1.7 - 7.7 K/uL   Lymphocytes Relative 45 12 - 46 %  Lymphs Abs 2.6 0.7 - 4.0 K/uL   Monocytes Relative 6 3 - 12 %   Monocytes Absolute 0.4 0.1 - 1.0 K/uL   Eosinophils Relative 1 0 - 5 %   Eosinophils Absolute 0.1 0.0 - 0.7 K/uL   Basophils Relative 1 0 - 1 %   Basophils Absolute 0.0 0.0 - 0.1 K/uL  Comprehensive metabolic panel     Status: Abnormal   Collection Time: 08/27/14  3:41 PM  Result Value Ref Range   Sodium 136 135 - 145 mmol/L   Potassium 3.7 3.5 - 5.1 mmol/L   Chloride 104 101 - 111 mmol/L   CO2 29 22 - 32 mmol/L   Glucose, Bld 117 (H) 65 - 99 mg/dL   BUN 11 6 - 20 mg/dL   Creatinine, Ser 0.79 0.44 - 1.00 mg/dL   Calcium 8.6 (L) 8.9 - 10.3 mg/dL   Total Protein 6.9 6.5 - 8.1 g/dL   Albumin 3.5 3.5 - 5.0 g/dL   AST 16 15 - 41 U/L   ALT 20 14 - 54 U/L   Alkaline Phosphatase 58 38 - 126 U/L   Total Bilirubin 0.3 0.3 - 1.2 mg/dL   GFR calc non Af Amer >60 >60 mL/min   GFR calc Af Amer >60 >60 mL/min    Comment: (NOTE) The eGFR has been calculated using the CKD EPI equation. This calculation has not been validated in all clinical situations. eGFR's persistently <60 mL/min signify possible Chronic Kidney Disease.    Anion gap 3 (L) 5 - 15  Ethanol     Status: None   Collection Time: 08/27/14  3:41 PM  Result Value Ref Range   Alcohol, Ethyl (B) <5 <5 mg/dL    Comment:        LOWEST DETECTABLE LIMIT FOR SERUM ALCOHOL IS 5 mg/dL FOR MEDICAL PURPOSES ONLY   Valproic acid level     Status: Abnormal   Collection Time: 08/27/14  3:41 PM  Result Value Ref Range   Valproic Acid Lvl 18 (L) 50.0 - 100.0 ug/mL  Acetaminophen level     Status: Abnormal   Collection Time: 08/27/14  3:41 PM  Result Value Ref Range   Acetaminophen (Tylenol), Serum <10 (L) 10 - 30 ug/mL     Comment:        THERAPEUTIC CONCENTRATIONS VARY SIGNIFICANTLY. A RANGE OF 10-30 ug/mL MAY BE AN EFFECTIVE CONCENTRATION FOR MANY PATIENTS. HOWEVER, SOME ARE BEST TREATED AT CONCENTRATIONS OUTSIDE THIS RANGE. ACETAMINOPHEN CONCENTRATIONS >150 ug/mL AT 4 HOURS AFTER INGESTION AND >50 ug/mL AT 12 HOURS AFTER INGESTION ARE OFTEN ASSOCIATED WITH TOXIC REACTIONS.   Salicylate level     Status: None   Collection Time: 08/27/14  3:41 PM  Result Value Ref Range   Salicylate Lvl <8.9 2.8 - 30.0 mg/dL    Vitals: Blood pressure 159/102, pulse 102, temperature 97.5 F (36.4 C), temperature source Oral, resp. rate 20, height _0  (1.702 m), weight 99.791 kg (220 lb), last menstrual period 01/13/2012, SpO2 100 %.  Risk to Self: Suicidal Ideation: No Suicidal Intent: No Is patient at risk for suicide?: No Suicidal Plan?: No Access to Means: No What has been your use of drugs/alcohol within the last 12 months?: Alcohol and crack cocaine use How many times?: 0 Other Self Harm Risks: NA Triggers for Past Attempts: None known Intentional Self Injurious Behavior: None Risk to Others: Homicidal Ideation: No Thoughts of Harm to Others: No Current Homicidal Intent: No Current Homicidal Plan: No Access  to Homicidal Means: No Identified Victim: NA History of harm to others?: No Assessment of Violence: None Noted Violent Behavior Description: NA Does patient have access to weapons?: No Criminal Charges Pending?: No Does patient have a court date: No Prior Inpatient Therapy: Prior Inpatient Therapy: Yes Prior Therapy Dates: 2015 Prior Therapy Facilty/Provider(s): [facility] Reason for Treatment: Polysubstance Prior Outpatient Therapy: Prior Outpatient Therapy: Yes Prior Therapy Dates: 2016 Prior Therapy Facilty/Provider(s): [facility] Reason for Treatment: Polysubstance abuse Does patient have an ACCT team?: No Does patient have Intensive In-House Services?  : No Does patient have  [facility] services? : No Does patient have P4CC services?: No  Current Facility-Administered Medications  Medication Dose Route Frequency Provider Last Rate Last Dose  . amantadine (SYMMETREL) capsule 100 mg  100 mg Oral BID Tatyana Kirichenko, PA-C   100 mg at 08/28/14 1155  . divalproex (DEPAKOTE ER) 24 hr tablet 500 mg  500 mg Oral BID Tatyana Kirichenko, PA-C   500 mg at 08/28/14 1155  . hydrOXYzine (ATARAX/VISTARIL) tablet 25 mg  25 mg Oral BID Orlie Dakin, MD   25 mg at 08/28/14 1155  . QUEtiapine (SEROQUEL) tablet 50 mg  50 mg Oral QHS Tatyana Kirichenko, PA-C   50 mg at 08/28/14 0159  . risperiDONE (RISPERDAL) tablet 2-4 mg  2-4 mg Oral BID Tatyana Kirichenko, PA-C   2 mg at 08/28/14 0159  . [START ON 09/10/2014] risperiDONE microspheres (RISPERDAL CONSTA) injection 50 mg  50 mg Intramuscular Q14 Days Jeannett Senior, PA-C       Current Outpatient Prescriptions  Medication Sig Dispense Refill  . amantadine (SYMMETREL) 100 MG capsule Take 100 mg by mouth 2 (two) times daily.    . divalproex (DEPAKOTE ER) 500 MG 24 hr tablet Take 1 tablet (500 mg total) by mouth 2 (two) times daily. (Patient taking differently: Take 1,000 mg by mouth 2 (two) times daily. )    . hydrOXYzine (VISTARIL) 25 MG capsule Take 25 mg by mouth 2 (two) times daily.    . QUEtiapine (SEROQUEL) 50 MG tablet Take 50 mg by mouth at bedtime.    . risperiDONE microspheres (RISPERDAL CONSTA) 50 MG injection Inject 50 mg into the muscle every 14 (fourteen) days.    . risperiDONE (RISPERDAL) 2 MG tablet Take 1 tablet (2 mg total) by mouth 2 (two) times daily. Take 2 mg po  Bid daily 60 tablet 0    Musculoskeletal: Strength & Muscle Tone: within normal limits Gait & Station: normal Patient leans: N/A  Psychiatric Specialty Exam: Physical Exam  Review of Systems  Constitutional: Negative.   HENT: Negative.   Eyes: Negative.   Respiratory: Negative.   Cardiovascular: Negative.   Gastrointestinal: Negative.    Genitourinary: Negative.   Musculoskeletal: Negative.   Skin: Negative.   Neurological: Negative.   Endo/Heme/Allergies: Negative.     Blood pressure 159/102, pulse 102, temperature 97.5 F (36.4 C), temperature source Oral, resp. rate 20, height _0  (1.702 m), weight 99.791 kg (220 lb), last menstrual period 01/13/2012, SpO2 100 %.Body mass index is 34.45 kg/(m^2).  General Appearance: Casual  Eye Contact::  Good  Speech:  Clear and Coherent and Normal Rate  Volume:  Normal  Mood:  Anxious  Affect:  Congruent  Thought Process:  Coherent, Goal Directed and Intact  Orientation:  Full (Time, Place, and Person)  Thought Content:  WDL  Suicidal Thoughts:  No  Homicidal Thoughts:  No  Memory:  Immediate;   Good Recent;   Good Remote;  Good  Judgement:  Fair  Insight:  Fair  Psychomotor Activity:  Normal  Concentration:  Good  Recall:  NA  Fund of Knowledge:Fair  Language: Good  Akathisia:  NA  Handed:  Right  AIMS (if indicated):     Assets:  Desire for Improvement  ADL's:  Intact  Cognition: WNL  Sleep:      Medical Decision Making: Established Problem, Stable/Improving (1)  Disposition: Discharge home, follow up with your outpatient providers,  Delfin Gant   PMHNP-BC 08/28/2014 2:09 PM Patient seen face-to-face for psychiatric evaluation, chart reviewed and case discussed with the physician extender and developed treatment plan. Reviewed the information documented and agree with the treatment plan. Corena Pilgrim, MD

## 2014-08-28 NOTE — ED Notes (Signed)
Josephine NP into see 

## 2014-08-28 NOTE — ED Notes (Signed)
Give 2 mg risperidal now VORB

## 2014-09-09 ENCOUNTER — Encounter (HOSPITAL_COMMUNITY): Payer: Self-pay | Admitting: Emergency Medicine

## 2014-09-09 ENCOUNTER — Emergency Department (HOSPITAL_COMMUNITY): Payer: Medicare Other

## 2014-09-09 ENCOUNTER — Emergency Department (HOSPITAL_COMMUNITY)
Admission: EM | Admit: 2014-09-09 | Discharge: 2014-09-09 | Disposition: A | Discharge status: Home / Self Care | Payer: Medicare Other | Attending: Emergency Medicine | Admitting: Emergency Medicine

## 2014-09-09 DIAGNOSIS — F250 Schizoaffective disorder, bipolar type: Secondary | ICD-10-CM | POA: Diagnosis not present

## 2014-09-09 DIAGNOSIS — Z79899 Other long term (current) drug therapy: Secondary | ICD-10-CM | POA: Diagnosis not present

## 2014-09-09 DIAGNOSIS — M2550 Pain in unspecified joint: Secondary | ICD-10-CM

## 2014-09-09 DIAGNOSIS — M25561 Pain in right knee: Secondary | ICD-10-CM | POA: Diagnosis present

## 2014-09-09 DIAGNOSIS — E669 Obesity, unspecified: Secondary | ICD-10-CM | POA: Diagnosis not present

## 2014-09-09 DIAGNOSIS — F319 Bipolar disorder, unspecified: Secondary | ICD-10-CM | POA: Insufficient documentation

## 2014-09-09 DIAGNOSIS — Z720 Tobacco use: Secondary | ICD-10-CM | POA: Insufficient documentation

## 2014-09-09 DIAGNOSIS — M7989 Other specified soft tissue disorders: Secondary | ICD-10-CM | POA: Diagnosis not present

## 2014-09-09 DIAGNOSIS — F1010 Alcohol abuse, uncomplicated: Secondary | ICD-10-CM

## 2014-09-09 NOTE — ED Notes (Signed)
Dr Oletta CohnPolina talked with patient, pt given resources for detox. Pt ambulatory and alert and oriented x 4 upon d/c. Pt calling ride to come get her.

## 2014-09-09 NOTE — ED Notes (Signed)
Pt escorted to waiting room and given a bus pass, sandwich and drink. Pt cursing stating "i'm going to call the police and sue this hospital" pt in NAD upon d/c and provided resources for detox. Dr Oletta CohnPolina spoke with patient and reviewed x-ray results with her prior to d/c.

## 2014-09-09 NOTE — ED Notes (Signed)
Pt is now requesting detox also

## 2014-09-09 NOTE — ED Provider Notes (Signed)
CSN: [id number]     Arrival date & time 09/09/14  1907 History   First MD Initiated Contact with Patient 09/09/14 2231     Chief Complaint  Patient presents with  . Knee Pain  . Medical Clearance     (Consider location/radiation/quality/duration/timing/severity/associated sxs/prior Treatment) HPI Comments: Presents to the emergency room with complaints of right knee pain. Patient reports that she has been experiencing pain for several days. She does a lot of walking. She has had previous problems with the knee. There has not been any recent injury.  She reports that she has history of alcohol abuse. She would like to be placed in detox.  Patient is a 54 y.o. female presenting with knee pain.  Knee Pain   Past Medical History  Diagnosis Date  . Arthritis   . Seizures   . Psychiatric illness   . Bipolar 1 disorder   . Schizo-affective schizophrenia   . Obesity   . Substance abuse    Past Surgical History  Procedure Laterality Date  . Stomach surgery     Family History  Problem Relation Age of Onset  . Alzheimer's disease Mother   . Depression Mother    History  Substance Use Topics  . Smoking status: Current Every Day Smoker -- 1.00 packs/day    Types: Cigarettes  . Smokeless tobacco: Never Used  . Alcohol Use: 3.6 oz/week    6 Cans of beer per week   OB History    No data available     Review of Systems  Musculoskeletal: Positive for arthralgias.  All other systems reviewed and are negative.     Allergies  Review of patient's allergies indicates no known allergies.  Home Medications   Prior to Admission medications   Medication Sig Start Date End Date Taking? Authorizing Provider  amantadine (SYMMETREL) 100 MG capsule Take 100 mg by mouth 2 (two) times daily.    Historical Provider, MD  divalproex (DEPAKOTE ER) 500 MG 24 hr tablet Take 1 tablet (500 mg total) by mouth 2 (two) times daily. Patient taking differently: Take 1,000 mg by mouth 2 (two) times  daily.  04/02/13   Zannie Cove, MD  hydrOXYzine (VISTARIL) 25 MG capsule Take 25 mg by mouth 2 (two) times daily.    Historical Provider, MD  QUEtiapine (SEROQUEL) 50 MG tablet Take 50 mg by mouth at bedtime.    Historical Provider, MD  risperiDONE (RISPERDAL) 2 MG tablet Take 1 tablet (2 mg total) by mouth 2 (two) times daily. Take 2 mg po  Bid daily 08/28/14   Earney Navy, NP  risperiDONE microspheres (RISPERDAL CONSTA) 50 MG injection Inject 50 mg into the muscle every 14 (fourteen) days.    Historical Provider, MD   BP 158/115 mmHg  Pulse 82  Temp(Src) 98.7 F (37.1 C) (Oral)  Resp 16  SpO2 99%  LMP 01/13/2012 Physical Exam  Constitutional: She is oriented to person, place, and time. She appears well-developed and well-nourished. No distress.  HENT:  Head: Normocephalic and atraumatic.  Right Ear: Hearing normal.  Left Ear: Hearing normal.  Nose: Nose normal.  Mouth/Throat: Oropharynx is clear and moist and mucous membranes are normal.  Eyes: Conjunctivae and EOM are normal. Pupils are equal, round, and reactive to light.  Neck: Normal range of motion. Neck supple.  Cardiovascular: Regular rhythm, S1 normal and S2 normal.  Exam reveals no gallop and no friction rub.   No murmur heard. Pulmonary/Chest: Effort normal and breath sounds normal. No  respiratory distress. She exhibits no tenderness.  Abdominal: Soft. Normal appearance and bowel sounds are normal. There is no hepatosplenomegaly. There is no tenderness. There is no rebound, no guarding, no tenderness at McBurney's point and negative [name]'s sign. No hernia.  Musculoskeletal: Normal range of motion.       Right knee: She exhibits swelling (pre-patella). She exhibits normal range of motion, no effusion, no ecchymosis, no deformity, no laceration and no erythema.  Neurological: She is alert and oriented to person, place, and time. She has normal strength. No cranial nerve deficit or sensory deficit. Coordination normal.  GCS eye subscore is 4. GCS verbal subscore is 5. GCS motor subscore is 6.  Skin: Skin is warm, dry and intact. No rash noted. No cyanosis.  Psychiatric: She has a normal mood and affect. Her speech is normal and behavior is normal. Thought content normal.  Nursing note and vitals reviewed.   ED Course  Procedures (including critical care time) Labs Review Labs Reviewed - No data to display  Imaging Review Dg Knee Complete 4 Views Right  09/09/2014   CLINICAL DATA:  54 year old female with a history of right knee pain and swelling  EXAM: RIGHT KNEE - COMPLETE 4+ VIEW  COMPARISON:  None.  FINDINGS: No acute fracture line identified. No significant soft tissue swelling. Early changes of osteoarthritis with marginal osteophyte formation. Joint effusion present.  IMPRESSION: Negative for acute bony abnormality.  Joint effusion.  Early changes of osteoarthritis.  Signed,  Yvone NeuJaime S. Loreta AveWagner, DO  [facility]  [facility]   Electronically Signed   By: Gilmer MorJaime  Wagner D.O.   On: 09/09/2014 20:25     EKG Interpretation None      MDM   Final diagnoses:  None   arthralgia Alcohol abuse  Presents to ER for evaluation of knee pain. Patient has not injured herself. She has very slight prepatellar swelling, but no joint effusion. There is no erythema or warmth. No concern for septic joint. X-ray does not show any acute abnormality.  Alcohol abuse. She would like to be placed in detox. She was informed that we do not place people in detox at this emergency department any longer, she was given resources for outpatient detox and rehabilitation services.   Gilda Creasehristopher J Pollina, MD 09/09/14 [phone]

## 2014-09-09 NOTE — Discharge Instructions (Signed)
Substance Abuse Treatment Programs ° °Intensive Outpatient Programs °[facility]     °[address]      °[place], [place]                   °[phone]      ° °[facility] °[address] °[place], [place] °[phone] ° °[facility]     °(Inpatient and outpatient)     °[address]           °[phone]   ° °[facility] °[phone] (Suboxone and Methadone) ° °[address]      °[place], [place] [address]      °[phone]      ° °[address] °[place], [place] °852-3033 ° °[facility] (Outpatient/Inpatient, Chemical)    °(insurance only) [phone]      °       °[facility] (Groups & Residential) °[place], [place] °[phone] ° °   °[facility]     °[address]     °[place], [place]      °[phone]      ° °[facility] (for caregivers and family) °[address] °[place], [place] °[phone] ° ° ° ° ° °Residential Treatment Programs °[facility]      °[address], [place], [place] [address]  °[phone]      ° °[facility] °[address] [place], [place] [address] °[phone] ° °[facility]        °[phone]      ° °[facility] °[phone] ° °[facility]             °[address]                                         °[place], [place]                                                °[phone] or [phone]                              ° °[facility] [place] °[address] °[place], [address] °1.877.941.8954 ° °[facility]    °[address]      °, [place]     °[phone]      ° °[facility] °[address] °, [place] °[phone] ° °[facility]   °[address]     °[place], [place] [address]     °[phone]      °Admissions: 8am-3pm M-F ° °[facility] °[address] °[place],  [place] °[phone] ° °[facility]: Residential Program (90 Days)   °[place], [place]      °[phone] or [phone]    ° °[facility] °[place], [place] °(Walk in Hours over the weekend or by referral) ° °[facility] °[address], [place], [place] [address] °(336) [phone] ° °Crisis Mobile: [facility]:  1-877-626-1772 (for crisis response 24 hours a day) °[facility] Hotline:      1-800-256-2452 °Outpatient Psychiatry and Counseling ° °[facility]: Mobile Crisis   Management 24 hours:  1-579-867-5796  [facility] of the [employer] sliding scale fee and walk in schedule: M-F 8am-12pm/1pm-3pm [address]  [place], [place] [address] [place], [place] [address] [phone]  [facility] ([facility])- new patient walk-in appointments available Monday - Friday 8am -3pm.          [address] [place], [place] [address] [phone] or crisis line- [facility]/ Intensive Outpatient Therapy Program [place], [place] [address] [place]      [phone] [place] [place], [place] [address]                 [place]   [facility] [phone] [place], [place] [address]   [facility]          [address] Johnette Abraham  [place], [place] [address]       [phone]  [facility] [address] [place], [place] [address] [phone]  [facility]    [address] [place], [place] [address]     [place], [place] Joycelyn Man     [place] [address]     [phone]       [facility] [place] [address]  [facility]     [address], [place], [place], [place] [address] [place]     [address]     [place], [place] [address]     [phone]       [facility] [address] [place], [place] [address] [phone] Resources for Temporary Residential Assistance/Crisis [facility]) M-F 8am-3pm   [address] [place], [place] [address]   [phone] Services include: laundry, barbering, support groups, case management, phone  & computer access, showers, AA/NA mtgs, mental health/substance abuse nurse, job skills class, disability information, VA assistance, spiritual classes, etc.   [facility]   [address], [place]     [place]              [employer] (women and children)       [place]. [place], [place] [address] [phone] [email or web address] for application and process Application Required  [facility]   [address]    [place] [address]     [phone]                    [place],  [address] [phone](OILNZVJK application appt.) Application Required  [facility] (women only)    [address]     [place],  [address]     [phone]  Intake starts 6pm daily Need valid ID, SSC, & Police report [facility] [place] [address] [place], [place] [phone] Application Required  [facility] (men only)     [address]      [place], [place]     [phone]       [facility] [place] (Pregnant women only) [address] [place], [place] [phone]  [facility]      [address]      [place], [place] [address]     [phone]             [facility] [address] [place], [place] [phone] 90 day commitment/SA/Application process  [facility](men only)     [address]     [place], [place]     [phone]       Check-in at [facility]            [facility] [address] [place], [place] [id number] [phone] Men/Women/Women and Children must be there by 7 pm  [facility] [place] [phone]                Arthralgia Your caregiver has diagnosed you as suffering from an arthralgia. Arthralgia means there is pain in a joint. This can come from many reasons including:  Bruising the joint which causes soreness (inflammation) in the joint.  Wear and tear on the joints which occur as we grow older (osteoarthritis).  Overusing the joint.  Various forms of arthritis.  Infections of the joint. Regardless of the cause of pain in your joint, most of these different pains respond to anti-inflammatory drugs and rest. The exception to this is when a joint is infected, and these cases are treated with antibiotics, if it is a bacterial infection. HOME CARE INSTRUCTIONS   Rest the injured area for as long as directed by your caregiver. Then slowly start using the joint as directed by your caregiver and as the pain allows. Crutches as directed may be useful if the ankles, knees or hips are involved. If the knee was splinted or casted, continue use and care as directed. If an stretchy or elastic wrapping bandage has been applied today, it should be removed and re-applied every 3 to 4 hours. It should not be applied tightly, but firmly enough to keep swelling down. Watch toes and feet for swelling, bluish discoloration, coldness, numbness or excessive pain. If any of these problems (symptoms) occur, remove the ace bandage and re-apply more loosely. If these symptoms  persist, contact your caregiver or return to this location.  For the first 24 hours, keep the injured extremity elevated on pillows while lying down.  Apply ice for 15-20 minutes to the sore joint every couple hours while awake for the first half day. Then 03-04 times per day for the first 48 hours. Put the ice in a plastic bag and place a towel between the bag of ice and your skin.  Wear any splinting, casting, elastic bandage applications, or slings as instructed.  Only take over-the-counter or prescription medicines for pain, discomfort, or fever as directed by your caregiver. Do not use aspirin immediately after the injury unless instructed by your physician. Aspirin can cause increased bleeding and bruising of the tissues.  If you were given crutches, continue to use them as instructed and do not resume weight bearing on the sore joint until instructed.  Persistent pain and inability to use the sore joint as directed for more than 2 to 3 days are warning signs indicating that you should see a caregiver for a follow-up visit as soon as possible. Initially, a hairline fracture (break in bone) may not be evident on X-rays. Persistent pain and swelling indicate that further evaluation, non-weight bearing or use of the joint (use of crutches or slings as instructed), or further X-rays are indicated. X-rays may sometimes not show a small fracture until a week or 10 days later. Make a follow-up appointment with your own caregiver or one to whom we have referred you. A radiologist (specialist in reading X-rays) may read your X-rays. Make sure you know how you are to obtain your X-ray results. Do not assume everything is normal if you do not hear from [place]. SEEK MEDICAL CARE IF: Bruising, swelling, or pain increases. SEEK IMMEDIATE MEDICAL CARE IF:   Your fingers or toes are numb or blue.  The pain is not responding to medications and continues to stay the same or get worse.  The pain in your joint  becomes severe.  You develop a fever over 102 F (38.9 C).  It becomes impossible to move or use the joint. MAKE SURE YOU:   Understand these instructions.  Will watch your condition.  Will get help right away if you are not doing well or get worse. Document Released: 02/28/2005 Document Revised: 05/23/2011 Document Reviewed: 10/17/2007 [facility] Patient Information 2015 [place], [place]. This information is not intended to replace advice given to you by your health care provider. Make sure you discuss any questions you have with your health care provider.

## 2014-09-09 NOTE — ED Notes (Signed)
Pt. reports right knee pain with swelling onset this weekend , denies injury or fall , ambulatory.

## 2014-09-20 ENCOUNTER — Encounter (HOSPITAL_COMMUNITY): Payer: Self-pay

## 2014-09-20 ENCOUNTER — Emergency Department (HOSPITAL_COMMUNITY)
Admission: EM | Admit: 2014-09-20 | Discharge: 2014-09-21 | Disposition: A | Discharge status: Home / Self Care | Payer: Medicare Other | Attending: Emergency Medicine | Admitting: Emergency Medicine

## 2014-09-20 DIAGNOSIS — F1020 Alcohol dependence, uncomplicated: Secondary | ICD-10-CM | POA: Diagnosis not present

## 2014-09-20 DIAGNOSIS — F250 Schizoaffective disorder, bipolar type: Secondary | ICD-10-CM | POA: Diagnosis not present

## 2014-09-20 DIAGNOSIS — F1010 Alcohol abuse, uncomplicated: Secondary | ICD-10-CM | POA: Diagnosis present

## 2014-09-20 DIAGNOSIS — G40909 Epilepsy, unspecified, not intractable, without status epilepticus: Secondary | ICD-10-CM | POA: Insufficient documentation

## 2014-09-20 DIAGNOSIS — Z79899 Other long term (current) drug therapy: Secondary | ICD-10-CM | POA: Diagnosis not present

## 2014-09-20 DIAGNOSIS — R000 Tachycardia, unspecified: Secondary | ICD-10-CM | POA: Insufficient documentation

## 2014-09-20 DIAGNOSIS — E669 Obesity, unspecified: Secondary | ICD-10-CM | POA: Diagnosis not present

## 2014-09-20 DIAGNOSIS — F319 Bipolar disorder, unspecified: Secondary | ICD-10-CM | POA: Insufficient documentation

## 2014-09-20 DIAGNOSIS — Z8739 Personal history of other diseases of the musculoskeletal system and connective tissue: Secondary | ICD-10-CM | POA: Insufficient documentation

## 2014-09-20 DIAGNOSIS — Z720 Tobacco use: Secondary | ICD-10-CM | POA: Diagnosis not present

## 2014-09-20 DIAGNOSIS — F1910 Other psychoactive substance abuse, uncomplicated: Secondary | ICD-10-CM

## 2014-09-20 LAB — RAPID URINE DRUG SCREEN, HOSP PERFORMED
Amphetamines: NOT DETECTED
BARBITURATES: NOT DETECTED
Benzodiazepines: NOT DETECTED
COCAINE: NOT DETECTED
OPIATES: NOT DETECTED
Tetrahydrocannabinol: NOT DETECTED

## 2014-09-20 LAB — COMPREHENSIVE METABOLIC PANEL
ALBUMIN: 3.4 g/dL — AB (ref 3.5–5.0)
ALT: 16 U/L (ref 14–54)
ANION GAP: 6 (ref 5–15)
AST: 14 U/L — ABNORMAL LOW (ref 15–41)
Alkaline Phosphatase: 55 U/L (ref 38–126)
BUN: 9 mg/dL (ref 6–20)
CALCIUM: 9.3 mg/dL (ref 8.9–10.3)
CO2: 30 mmol/L (ref 22–32)
CREATININE: 0.7 mg/dL (ref 0.44–1.00)
Chloride: 103 mmol/L (ref 101–111)
GFR calc Af Amer: >60 mL/min (ref >60)
GFR calc non Af Amer: >60 mL/min (ref >60)
Glucose, Bld: 91 mg/dL (ref 65–99)
Potassium: 4 mmol/L (ref 3.5–5.1)
Sodium: 139 mmol/L (ref 135–145)
TOTAL PROTEIN: 7 g/dL (ref 6.5–8.1)
Total Bilirubin: 0.4 mg/dL (ref 0.3–1.2)

## 2014-09-20 LAB — CBC
HCT: 42.4 % (ref 36.0–46.0)
Hemoglobin: 13.6 g/dL (ref 12.0–15.0)
MCH: 28.2 pg (ref 26.0–34.0)
MCHC: 32.1 g/dL (ref 30.0–36.0)
MCV: 88 fL (ref 78.0–100.0)
PLATELETS: 286 10*3/uL (ref 150–400)
RBC: 4.82 MIL/uL (ref 3.87–5.11)
RDW: 14.5 % (ref 11.5–15.5)
WBC: 7.8 10*3/uL (ref 4.0–10.5)

## 2014-09-20 LAB — VALPROIC ACID LEVEL: Valproic Acid Lvl: 34 ug/mL — ABNORMAL LOW (ref 50.0–100.0)

## 2014-09-20 LAB — ETHANOL: Alcohol, Ethyl (B): <5 mg/dL (ref <5)

## 2014-09-20 MED ORDER — THIAMINE HCL 100 MG/ML IJ SOLN: 100.0000 mg | Freq: Every day | INTRAMUSCULAR | Status: DC

## 2014-09-20 MED ORDER — HYDROXYZINE HCL 25 MG PO TABS
25.0000 mg | ORAL_TABLET | Freq: Two times a day (BID) | ORAL | Status: DC
  Administered 2014-09-20 – 2014-09-21 (×2): 25 mg via ORAL
  Filled 2014-09-20 (×2): qty 1

## 2014-09-20 MED ORDER — ACETAMINOPHEN 325 MG PO TABS
650.0000 mg | ORAL_TABLET | ORAL | Status: DC | PRN
  Administered 2014-09-21: 650 mg via ORAL
  Filled 2014-09-20: qty 2

## 2014-09-20 MED ORDER — LORAZEPAM 1 MG PO TABS: 0.0000 mg | ORAL_TABLET | Freq: Two times a day (BID) | ORAL | Status: DC

## 2014-09-20 MED ORDER — QUETIAPINE FUMARATE 50 MG PO TABS
50.0000 mg | ORAL_TABLET | Freq: Every day | ORAL | Status: DC
  Administered 2014-09-20: 50 mg via ORAL
  Filled 2014-09-20: qty 1

## 2014-09-20 MED ORDER — DIVALPROEX SODIUM ER 500 MG PO TB24
500.0000 mg | ORAL_TABLET | Freq: Two times a day (BID) | ORAL | Status: DC
  Administered 2014-09-20 – 2014-09-21 (×2): 500 mg via ORAL
  Filled 2014-09-20 (×4): qty 1

## 2014-09-20 MED ORDER — HYDROXYZINE PAMOATE 25 MG PO CAPS
25.0000 mg | ORAL_CAPSULE | Freq: Two times a day (BID) | ORAL | Status: DC
  Filled 2014-09-20: qty 1

## 2014-09-20 MED ORDER — LORAZEPAM 1 MG PO TABS: 0.0000 mg | ORAL_TABLET | Freq: Four times a day (QID) | ORAL | Status: DC

## 2014-09-20 MED ORDER — RISPERIDONE 2 MG PO TABS
2.0000 mg | ORAL_TABLET | Freq: Two times a day (BID) | ORAL | Status: DC
  Administered 2014-09-20 – 2014-09-21 (×2): 2 mg via ORAL
  Filled 2014-09-20 (×2): qty 1

## 2014-09-20 MED ORDER — IBUPROFEN 200 MG PO TABS
600.0000 mg | ORAL_TABLET | Freq: Three times a day (TID) | ORAL | Status: DC | PRN
  Administered 2014-09-21: 600 mg via ORAL
  Filled 2014-09-20: qty 3

## 2014-09-20 MED ORDER — AMANTADINE HCL 100 MG PO CAPS
100.0000 mg | ORAL_CAPSULE | Freq: Two times a day (BID) | ORAL | Status: DC
  Administered 2014-09-20 – 2014-09-21 (×2): 100 mg via ORAL
  Filled 2014-09-20 (×4): qty 1

## 2014-09-20 MED ORDER — VITAMIN B-1 100 MG PO TABS
100.0000 mg | ORAL_TABLET | Freq: Every day | ORAL | Status: DC
  Administered 2014-09-20 – 2014-09-21 (×2): 100 mg via ORAL
  Filled 2014-09-20 (×2): qty 1
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
## 2014-09-20 NOTE — ED Notes (Signed)
Up to the bathroom,  Pt requesting to see a medical MD to evaluate her leg/knee, will relay request

## 2014-09-20 NOTE — ED Provider Notes (Signed)
54 year old female complaining of knee pain. She is in our psych area awaiting disposition. She reports history of similar knee pain. She has, "fluid on her knee." Here she does have a joint effusion. She's got full range of motion and there is no erythema or tenderness. No concern for septic arthritis.  Elwin MochaBlair Myrlene Riera, MD 09/20/14 [phone]

## 2014-09-20 NOTE — ED Notes (Signed)
Pt reports that she has taken her medications this morning

## 2014-09-20 NOTE — ED Notes (Addendum)
Per ems pt is from home, requesting ETOH detox. Last ETOH at 0400 today.   pts casemaker [facility] pt [facility]   Upon rn assessment, pt slurring speech. Pt reports ETOH use daily, "drinks as much as she can". Denies drug use. Denies SI/HI. Denies AH/VH, reports in past she has, but gets risperdol injections, her injection is due this week. Reports chronic right knee pain 10/10.

## 2014-09-20 NOTE — ED Notes (Signed)
Pt. Noted sleeping in room. No complaints or concerns voiced. No distress or abnormal behavior noted. Will continue to monitor with security cameras. Q 15 minute rounds continue. 

## 2014-09-20 NOTE — ED Notes (Signed)
Up to the bathroom 

## 2014-09-20 NOTE — ED Notes (Signed)
Bed: [facility] Expected date:  Expected time:  Means of arrival:  Comments: EMS ETOH detox 

## 2014-09-20 NOTE — ED Notes (Signed)
Report received from Janie Rambo RN. Pt. Sleeping, respirations regular and unlabored. Will continue to monitor for safety via security cameras and Q 15 minute checks. 

## 2014-09-20 NOTE — ED Provider Notes (Signed)
CSN: [id number]     Arrival date & time 09/20/14  1252 History   First MD Initiated Contact with Patient 09/20/14 1343     Chief Complaint  Patient presents with  . ETOH Detox      (Consider location/radiation/quality/duration/timing/severity/associated sxs/prior Treatment) The history is provided by the patient.   patient has a history of schizoaffective schizophrenia and bipolar disorder along substance abuse. States she is here to go into rehabilitation for alcohol abuse. It is unclear how much she drinks. She states she drinks 4 gallons a day. States it is beer or wine. States she last drank last night. States she was told to come in here and maybe she can go somewhat like they marked. Previous [place] abuse involved cocaine also but does not use that currently. She states now she has been having some auditory hallucinations. States she is due for her monthly shot. depression or suicidal thoughts.  Past Medical History  Diagnosis Date  . Arthritis   . Seizures   . Psychiatric illness   . Bipolar 1 disorder   . Schizo-affective schizophrenia   . Obesity   . Substance abuse    Past Surgical History  Procedure Laterality Date  . Stomach surgery     Family History  Problem Relation Age of Onset  . Alzheimer's disease Mother   . Depression Mother    History  Substance Use Topics  . Smoking status: Current Every Day Smoker -- 1.00 packs/day    Types: Cigarettes  . Smokeless tobacco: Never Used  . Alcohol Use: 3.6 oz/week    6 Cans of beer per week   OB History    No data available     Review of Systems  Constitutional: Negative for activity change.  Eyes: Negative for pain.  Respiratory: Negative for chest tightness.   Cardiovascular: Negative for chest pain and leg swelling.  Gastrointestinal: Negative for nausea, vomiting, abdominal pain and diarrhea.  Genitourinary: Negative for flank pain.  Musculoskeletal: Negative for back pain and neck stiffness.  Skin:  Negative for rash.  Neurological: Negative for weakness and headaches.  Psychiatric/Behavioral: Positive for hallucinations. Negative for suicidal ideas and behavioral problems. The patient is not hyperactive.       Allergies  Review of patient's allergies indicates no known allergies.  Home Medications   Prior to Admission medications   Medication Sig Start Date End Date Taking? Authorizing Provider  amantadine (SYMMETREL) 100 MG capsule Take 100 mg by mouth 2 (two) times daily.   Yes Historical Provider, MD  divalproex (DEPAKOTE ER) 500 MG 24 hr tablet Take 1 tablet (500 mg total) by mouth 2 (two) times daily. Patient taking differently: Take 1,000 mg by mouth 2 (two) times daily.  04/02/13  Yes Zannie CovePreetha Joseph, MD  hydrOXYzine (VISTARIL) 25 MG capsule Take 25 mg by mouth 2 (two) times daily.   Yes Historical Provider, MD  QUEtiapine (SEROQUEL) 50 MG tablet Take 50 mg by mouth at bedtime.   Yes Historical Provider, MD  risperiDONE (RISPERDAL) 2 MG tablet Take 1 tablet (2 mg total) by mouth 2 (two) times daily. Take 2 mg po  Bid daily 08/28/14  Yes Earney NavyJosephine C Onuoha, NP  risperiDONE microspheres (RISPERDAL CONSTA) 50 MG injection Inject 50 mg into the muscle every 14 (fourteen) days.   Yes Historical Provider, MD   BP 131/76 mmHg  Pulse 83  Temp(Src) 98.2 F (36.8 C) (Oral)  Resp 14  SpO2 95%  LMP 01/13/2012 Physical Exam  Constitutional: She  appears well-developed.  HENT:  Head: Atraumatic.  Eyes: EOM are normal.  Cardiovascular:  Mild tachycardia  Pulmonary/Chest: Effort normal.  Abdominal: There is no tenderness.  Musculoskeletal: She exhibits no edema.  Neurological: She is alert.  Skin: Skin is warm.  Psychiatric: She has a normal mood and affect.    ED Course  Procedures (including critical care time) Labs Review Labs Reviewed  VALPROIC ACID LEVEL - Abnormal; Notable for the following:    Valproic Acid Lvl 34 (*)    All other components within normal limits   COMPREHENSIVE METABOLIC PANEL - Abnormal; Notable for the following:    Albumin 3.4 (*)    AST 14 (*)    All other components within normal limits  ETHANOL  CBC  URINE RAPID DRUG SCREEN, HOSP PERFORMED    Imaging Review No results found.   EKG Interpretation None      MDM   Final diagnoses:  Schizoaffective disorder, bipolar type  Substance abuse    Patient presents requesting detox off alcohol. States she drank 4 gallons yesterday. I doubt she actually drank 4 gallons. Alcohol level is 0. He denies other substance abuse. States she is now hallucinating. Does have a history of bipolar disorder and schizophrenia. We'll get seen by TTS. She is not appear to need inpatient alcohol treatment at this time but her other medical illnesses may be worsening.    Benjiman Core, MD 09/20/14 [phone]

## 2014-09-20 NOTE — ED Notes (Signed)
Pt. Alert and oriented in no distress denies SI, HI, AVH and pain.  Pt. Instructed to come to me with problems or concerns.Will continue to monitor for safety via security cameras and Q 15 minute checks. 

## 2014-09-21 DIAGNOSIS — F1010 Alcohol abuse, uncomplicated: Secondary | ICD-10-CM | POA: Diagnosis not present

## 2014-09-21 DIAGNOSIS — F1020 Alcohol dependence, uncomplicated: Secondary | ICD-10-CM

## 2014-09-21 DIAGNOSIS — F250 Schizoaffective disorder, bipolar type: Secondary | ICD-10-CM

## 2014-09-21 NOTE — ED Notes (Signed)
Pt. Noted sleeping in room. No complaints or concerns voiced. No distress or abnormal behavior noted. Will continue to monitor with security cameras. Q 15 minute rounds continue. 

## 2014-09-21 NOTE — ED Notes (Signed)
Dr Tenny Crawross and josephine np into see

## 2014-09-21 NOTE — Progress Notes (Signed)
9:27am. Psych team requested CSW call two numbers provided by pt to get collateral.  First number--[phone]--is disconnected.  Second number--[phone]--is for pt guardian, [name]. Weeks is a DSS guardian and thus will likely not answer calls on weekend. CSW left message.  York SpanielAlexandra Safia [name] [facility] Gerops[facility] UnitCSWA Clinical Social Worker [facility] phone: [phone]

## 2014-09-21 NOTE — Consult Note (Signed)
[place] Psychiatry Consult   Reason for Consult:  Alcohol use dfsorder, severe, Schizoaffective disorder, Bipolar type by hx Referring Physician:  EDP Patient Identification: [name] MRN:  [id number] Principal Diagnosis: Alcohol dependence Diagnosis:   Patient Active Problem List   Diagnosis Date Noted  . Schizoaffective disorder, bipolar type [F25.0]     Priority: High  . Alcohol dependence [F10.20] 06/28/2011    Priority: High  . Schizophrenia, unspecified type [F20.9]   . Suicidal ideations [R45.851]   . Schizoaffective disorder [F25.9] 03/29/2013  . Pneumonia [J18.9] 03/29/2013  . Cocaine abuse [F14.10] 06/30/2011  . Non-compliant patient [Z91.19] 06/29/2011  . Hypertension [I10] 06/29/2011  . Polysubstance dependence [F19.20] 04/03/2011  . Polysubstance abuse [F19.10] 06/21/2010    Total Time spent with patient: 1 hour  Subjective:   [name] is a 54 y.o. female patient admitted with Alcohol use dfsorder, severe, Schizoaffective disorder, Bipolar type by hx  HPI:  AA female, 54 years old was evaluated today seeking detox treatment for Alcohol.  Patient reports that she has been drinking heavily and will like to detox and move on to [facility].  Patient reports that her her counselor asked her to come to the ER for detox.   She reports drinking 3 40 oz beer three to 4 times a week.  Last drink was 4 am yesterday. Patient also states she is angry about not having access to her money.  Patient states she does not get along with her roommate at a boarding house.  Patient reports been dropped by [facility].   She also reported that there is plan for her to resume seeing the same ACT team.  Patient reports that she takes Risperdal Consta twice a month and is still taking PO medication Risperdal.   Patient reports that she is compliant wither medications.   Patient denies SI/HI/AVH.  Efforts to gather collateral information from her Guardian has failed.   We will have to contact [facility] before discharging patient since her Risperdal Consta injection id due.   We will keep overnight and re-evaluate in am.  HPI Elements:   Location:  Alcohol use disorder, severe, Schizoaffective disorder, Bipolar type  by hx. Quality:  severe-moderate. Severity:  severe-moderate. Timing:  Acute. Duration:  Chronic mental illness. Context:  Came in seeking detox treatment before going to [facility] for long term substance abuse.Marland Kitchen  Past Medical History:  Past Medical History  Diagnosis Date  . Arthritis   . Seizures   . Psychiatric illness   . Bipolar 1 disorder   . Schizo-affective schizophrenia   . Obesity   . Substance abuse     Past Surgical History  Procedure Laterality Date  . Stomach surgery     Family History:  Family History  Problem Relation Age of Onset  . Alzheimer's disease Mother   . Depression Mother    Social History:  History  Alcohol Use  . 3.6 oz/week  . 6 Cans of beer per week     History  Drug Use  . 5.00 per week  . Special: "Crack" cocaine, Cocaine    Comment: crack/cocaine    History   Social History  . Marital Status: Single    Spouse Name: N/A  . Number of Children: N/A  . Years of Education: N/A   Social History Main Topics  . Smoking status: Current Every Day Smoker -- 1.00 packs/day    Types: Cigarettes  . Smokeless tobacco: Never  Used  . Alcohol Use: 3.6 oz/week    6 Cans of beer per week  . Drug Use: 5.00 per week    Special: "Crack" cocaine, Cocaine     Comment: crack/cocaine  . Sexual Activity: No   Other Topics Concern  . None   Social History Narrative   Additional Social History:    Pain Medications: pt denies abuse Prescriptions: pt denies abuse Over the Counter: pt denies abuse History of alcohol / drug use?: Yes Negative Consequences of Use: Financial, Scientist, research (physical sciences), Personal relationships, Work / School Name of Substance 1: alcohol 1 - Age of First Use: 17 1 - Amount  (size/oz): two 40 oz beers 1 - Frequency: every other day 1 - Duration: years 1 - Last Use / Amount: 09/20/14 -two 40 oz beers Name of Substance 2: crack cocaine 2 - Age of First Use: 23 2 - Amount (size/oz): varies 2 - Frequency: occasionally 2 - Last Use / Amount: pt doesn't remember last use - UDS is negative                 Allergies:  No Known Allergies  Labs:  Results for orders placed or performed during the hospital encounter of 09/20/14 (from the past 48 hour(s))  Urine rapid drug screen (hosp performed)     Status: None   Collection Time: 09/20/14 12:53 PM  Result Value Ref Range   Opiates NONE DETECTED NONE DETECTED   Cocaine NONE DETECTED NONE DETECTED   Benzodiazepines NONE DETECTED NONE DETECTED   Amphetamines NONE DETECTED NONE DETECTED   Tetrahydrocannabinol NONE DETECTED NONE DETECTED   Barbiturates NONE DETECTED NONE DETECTED    Comment:        DRUG SCREEN FOR MEDICAL PURPOSES ONLY.  IF CONFIRMATION IS NEEDED FOR ANY PURPOSE, NOTIFY LAB WITHIN 5 DAYS.        LOWEST DETECTABLE LIMITS FOR URINE DRUG SCREEN Drug Class       Cutoff (ng/mL) Amphetamine      1000 Barbiturate      200 Benzodiazepine   161 Tricyclics       096 Opiates          300 Cocaine          300 THC              50   Valproic acid level     Status: Abnormal   Collection Time: 09/20/14  2:35 PM  Result Value Ref Range   Valproic Acid Lvl 34 (L) 50.0 - 100.0 ug/mL  Ethanol     Status: None   Collection Time: 09/20/14  2:35 PM  Result Value Ref Range   Alcohol, Ethyl (B) <5 <5 mg/dL    Comment:        LOWEST DETECTABLE LIMIT FOR SERUM ALCOHOL IS 5 mg/dL FOR MEDICAL PURPOSES ONLY   CBC     Status: None   Collection Time: 09/20/14  2:35 PM  Result Value Ref Range   WBC 7.8 4.0 - 10.5 K/uL   RBC 4.82 3.87 - 5.11 MIL/uL   Hemoglobin 13.6 12.0 - 15.0 g/dL   HCT 42.4 36.0 - 46.0 %   MCV 88.0 78.0 - 100.0 fL   MCH 28.2 26.0 - 34.0 pg   MCHC 32.1 30.0 - 36.0 g/dL   RDW 14.5  11.5 - 15.5 %   Platelets 286 150 - 400 K/uL  Comprehensive metabolic panel     Status: Abnormal   Collection Time: 09/20/14  2:35 PM  Result Value Ref Range   Sodium 139 135 - 145 mmol/L   Potassium 4.0 3.5 - 5.1 mmol/L   Chloride 103 101 - 111 mmol/L   CO2 30 22 - 32 mmol/L   Glucose, Bld 91 65 - 99 mg/dL   BUN 9 6 - 20 mg/dL   Creatinine, Ser 0.70 0.44 - 1.00 mg/dL   Calcium 9.3 8.9 - 10.3 mg/dL   Total Protein 7.0 6.5 - 8.1 g/dL   Albumin 3.4 (L) 3.5 - 5.0 g/dL   AST 14 (L) 15 - 41 U/L   ALT 16 14 - 54 U/L   Alkaline Phosphatase 55 38 - 126 U/L   Total Bilirubin 0.4 0.3 - 1.2 mg/dL   GFR calc non Af Amer >60 >60 mL/min   GFR calc Af Amer >60 >60 mL/min    Comment: (NOTE) The eGFR has been calculated using the CKD EPI equation. This calculation has not been validated in all clinical situations. eGFR's persistently <60 mL/min signify possible Chronic Kidney Disease.    Anion gap 6 5 - 15    Vitals: Blood pressure 152/81, pulse 79, temperature 97.8 F (36.6 C), temperature source Oral, resp. rate 16, last menstrual period 01/13/2012, SpO2 94 %.  Risk to Self: Suicidal Ideation: No Suicidal Intent: No Is patient at risk for suicide?: No Suicidal Plan?: No Access to Means:  (na) What has been your use of drugs/alcohol within the last 12 months?: alcohol use every two days How many times?: 1 (pt sts several years ago tried to drink herself to death) Other Self Harm Risks: none Triggers for Past Attempts: None known Intentional Self Injurious Behavior: None Risk to Others: Homicidal Ideation: No Thoughts of Harm to Others: No Current Homicidal Intent: No Current Homicidal Plan: No Access to Homicidal Means: No Identified Victim: none History of harm to others?: No Assessment of Violence: None Noted Violent Behavior Description: pt denies hx violence Does patient have access to weapons?: No Criminal Charges Pending?: No Does patient have a court date: No Prior  Inpatient Therapy: Prior Inpatient Therapy: Yes Prior Therapy Dates: unknown years, [facility] -2012-2013 Prior Therapy Facilty/Provider(s): [facility] Reason for Treatment: schizoaffective d/o, polysubstance abuse Prior Outpatient Therapy: Prior Outpatient Therapy: Yes Prior Therapy Dates: in the recent past Prior Therapy Facilty/Provider(s): unknown ([facility] no longer her provider) Reason for Treatment: med management, schizoaffective d/o, SA Does patient have an ACCT team?: Unknown (unknown) Does patient have Intensive In-House Services?  : No Does patient have [facility] services? : Unknown Does patient have P4CC services?: Unknown  Current Facility-Administered Medications  Medication Dose Route Frequency Provider Last Rate Last Dose  . acetaminophen (TYLENOL) tablet 650 mg  650 mg Oral Q4H PRN Davonna Belling, MD      . amantadine (SYMMETREL) capsule 100 mg  100 mg Oral BID Davonna Belling, MD   100 mg at 09/21/14 0909  . divalproex (DEPAKOTE ER) 24 hr tablet 500 mg  500 mg Oral BID Davonna Belling, MD   500 mg at 09/21/14 0909  . hydrOXYzine (ATARAX/VISTARIL) tablet 25 mg  25 mg Oral BID Alphonzo Severance, RPH   25 mg at 09/21/14 6720  . ibuprofen (ADVIL,MOTRIN) tablet 600 mg  600 mg Oral Q8H PRN Davonna Belling, MD   600 mg at 09/21/14 1004  . LORazepam (ATIVAN) tablet 0-4 mg  0-4 mg Oral 4 times per day Davonna Belling, MD   Stopped at 09/21/14 0601   Followed by  . [START ON 09/22/2014] LORazepam (  ATIVAN) tablet 0-4 mg  0-4 mg Oral Q12H Davonna Belling, MD      . QUEtiapine (SEROQUEL) tablet 50 mg  50 mg Oral QHS Davonna Belling, MD   50 mg at 09/20/14 2125  . risperiDONE (RISPERDAL) tablet 2 mg  2 mg Oral BID Davonna Belling, MD   2 mg at 09/21/14 0909  . thiamine (VITAMIN B-1) tablet 100 mg  100 mg Oral Daily Davonna Belling, MD   100 mg at 09/21/14 3338   Or  . thiamine (B-1) injection 100 mg  100 mg Intravenous Daily Davonna Belling, MD       Current Outpatient  Prescriptions  Medication Sig Dispense Refill  . amantadine (SYMMETREL) 100 MG capsule Take 100 mg by mouth 2 (two) times daily.    . divalproex (DEPAKOTE ER) 500 MG 24 hr tablet Take 1 tablet (500 mg total) by mouth 2 (two) times daily. (Patient taking differently: Take 1,000 mg by mouth 2 (two) times daily. )    . hydrOXYzine (VISTARIL) 25 MG capsule Take 25 mg by mouth 2 (two) times daily.    . QUEtiapine (SEROQUEL) 50 MG tablet Take 50 mg by mouth at bedtime.    . risperiDONE (RISPERDAL) 2 MG tablet Take 1 tablet (2 mg total) by mouth 2 (two) times daily. Take 2 mg po  Bid daily 60 tablet 0  . risperiDONE microspheres (RISPERDAL CONSTA) 50 MG injection Inject 50 mg into the muscle every 14 (fourteen) days.      Musculoskeletal: Strength & Muscle Tone: within normal limits Gait & Station: normal Patient leans: N/A  Psychiatric Specialty Exam: Physical Exam  Review of Systems  Constitutional: Negative.   HENT: Negative.   Eyes: Negative.   Respiratory: Negative.   Cardiovascular: Negative.   Gastrointestinal: Negative.   Genitourinary: Negative.   Musculoskeletal: Negative.   Skin: Negative.   Neurological: Negative.   Endo/Heme/Allergies: Negative.     Blood pressure 152/81, pulse 79, temperature 97.8 F (36.6 C), temperature source Oral, resp. rate 16, last menstrual period 01/13/2012, SpO2 94 %.There is no weight on file to calculate BMI.  General Appearance: Casual and Fairly Groomed  Engineer, water::  Good  Speech:  Clear and Coherent and Normal Rate  Volume:  Normal  Mood:  Anxious  Affect:  Congruent  Thought Process:  Coherent, Goal Directed and Intact  Orientation:  Full (Time, Place, and Person)  Thought Content:  WDL  Suicidal Thoughts:  No  Homicidal Thoughts:  No  Memory:  Immediate;   Good Recent;   Good Remote;   Good  Judgement:  Fair  Insight:  Shallow  Psychomotor Activity:  Normal  Concentration:  Fair  Recall:  Good  Fund of Knowledge:Fair   Language: Good  Akathisia:  NA  Handed:  Right  AIMS (if indicated):     Assets:  Desire for Improvement  ADL's:  Intact  Cognition: WNL  Sleep:      Medical Decision Making: Review of Psycho-Social Stressors (1)  Treatment Plan Summary: Daily contact with patient to assess and evaluate symptoms and progress in treatment and Medication management  Plan:  Resume home medications Disposition: Observe overnight and re-evaluate in am  Delfin Gant   PMHNP-BC 09/21/2014 12:06 PM  Patient seen and I agree with treatment plan  Levonne Spiller M.D.

## 2014-09-21 NOTE — ED Notes (Signed)
On the phone, reports knee is hurting again

## 2014-09-21 NOTE — ED Notes (Signed)
Pt ambulatory w/o difficulty from ED.  Pt changed into scrubs and wanded by security on arrival to unit.

## 2014-09-21 NOTE — ED Notes (Addendum)
TTS into see, ice pack given

## 2014-09-21 NOTE — ED Notes (Signed)
Up on the phone 

## 2014-09-21 NOTE — ED Notes (Signed)
Dr Nehemiah MassedWaldon updated regarding patients and will see.

## 2014-09-21 NOTE — [facility] Suicide Risk Assessment (Signed)
Suicide Risk Assessment  Discharge Assessment   [facility]   Demographic Factors:  Low socioeconomic status and Unemployed  Total Time spent with patient: 20 minutes  Musculoskeletal: Strength & Muscle Tone: within normal limits Gait & Station: normal Patient leans: N/A  Psychiatric Specialty Exam:     Blood pressure 163/80, pulse 84, temperature 98.1 F (36.7 C), temperature source Oral, resp. rate 18, last menstrual period 01/13/2012, SpO2 100 %.There is no weight on file to calculate BMI.  General Appearance: Casual and Fairly Groomed  Patent attorneyye Contact::  Good  Speech:  Clear and Coherent and Normal Rate  Volume:  Normal  Mood:  Anxious  Affect:  Congruent  Thought Process:  Coherent, Goal Directed and Intact  Orientation:  Full (Time, Place, and Person)  Thought Content:  WDL  Suicidal Thoughts:  No  Homicidal Thoughts:  No  Memory:  Immediate;   Good Recent;   Good Remote;   Good  Judgement:  Fair  Insight:  Shallow  Psychomotor Activity:  Normal  Concentration:  Fair  Recall:  Good  Fund of Knowledge:Fair  Language: Good  Akathisia:  NA  Handed:  Right  AIMS (if indicated):     Assets:  Desire for Improvement  ADL's:  Intact  Cognition: WNL  Sleep:           Has this patient used any form of tobacco in the last 30 days? (Cigarettes, Smokeless Tobacco, Cigars, and/or Pipes) Yes, A prescription for an FDA-approved tobacco cessation medication was offered at discharge and the patient refused  Mental Status Per Nursing Assessment::   On Admission:     Current Mental Status by Physician: NA  Loss Factors: NA  Historical Factors: NA  Risk Reduction Factors:   Religious beliefs about death, Living with another person, especially a relative and Positive social support  Continued Clinical Symptoms:  Bipolar Disorder:   Depressive phase Depression:   Insomnia Alcohol/Substance Abuse/Dependencies  Cognitive Features That  Contribute To Risk:  Polarized thinking    Suicide Risk:  Minimal: No identifiable suicidal ideation.  Patients presenting with no risk factors but with morbid ruminations; may be classified as minimal risk based on the severity of the depressive symptoms  Principal Problem: Alcohol dependence Discharge Diagnoses:  Patient Active Problem List   Diagnosis Date Noted  . Schizoaffective disorder, bipolar type [F25.0]     Priority: High  . Alcohol dependence [F10.20] 06/28/2011    Priority: High  . Schizophrenia, unspecified type [F20.9]   . Suicidal ideations [R45.851]   . Schizoaffective disorder [F25.9] 03/29/2013  . Pneumonia [J18.9] 03/29/2013  . Cocaine abuse [F14.10] 06/30/2011  . Non-compliant patient [Z91.19] 06/29/2011  . Hypertension [I10] 06/29/2011  . Polysubstance dependence [F19.20] 04/03/2011  . Polysubstance abuse [F19.10] 06/21/2010      Plan Of Care/Follow-up recommendations:  Activity:  as tolerated Diet:  regular  Is patient on multiple antipsychotic therapies at discharge:  No   Has Patient had three or more failed trials of antipsychotic monotherapy by history:  No  Recommended Plan for Multiple Antipsychotic Therapies: NA    Earney NavyONUOHA, JOSEPHINE C   PMHNP-BC 09/21/2014, 2:48 PM  Patient seen and I agree with treatment and plan  Jamse Belfasteborah Lauro Manlove M.D.

## 2014-09-21 NOTE — ED Notes (Signed)
Written dc instructions reviewed w/ pt.  Pt encouraged to take her medications as directed, follow up with her primary MD, avoid alcohol,  contact [facility] tomorrow and follow up with her ACT team.  Pt verbalized understanding.  Pt ambulatory w/o difficulty to dc window w/ mHt.  Bus pass given, belongings returned after leaving the area.

## 2014-12-08 ENCOUNTER — Emergency Department (HOSPITAL_COMMUNITY)
Admission: EM | Admit: 2014-12-08 | Discharge: 2014-12-08 | Disposition: A | Discharge status: Home / Self Care | Payer: Medicare Other | Attending: Emergency Medicine | Admitting: Emergency Medicine

## 2014-12-08 ENCOUNTER — Encounter (HOSPITAL_COMMUNITY): Payer: Self-pay

## 2014-12-08 DIAGNOSIS — M545 Low back pain: Secondary | ICD-10-CM | POA: Insufficient documentation

## 2014-12-08 DIAGNOSIS — F319 Bipolar disorder, unspecified: Secondary | ICD-10-CM | POA: Insufficient documentation

## 2014-12-08 DIAGNOSIS — G40909 Epilepsy, unspecified, not intractable, without status epilepticus: Secondary | ICD-10-CM | POA: Diagnosis not present

## 2014-12-08 DIAGNOSIS — Z9889 Other specified postprocedural states: Secondary | ICD-10-CM | POA: Diagnosis not present

## 2014-12-08 DIAGNOSIS — R110 Nausea: Secondary | ICD-10-CM | POA: Diagnosis not present

## 2014-12-08 DIAGNOSIS — Z720 Tobacco use: Secondary | ICD-10-CM | POA: Diagnosis not present

## 2014-12-08 DIAGNOSIS — E669 Obesity, unspecified: Secondary | ICD-10-CM | POA: Diagnosis not present

## 2014-12-08 DIAGNOSIS — Z79899 Other long term (current) drug therapy: Secondary | ICD-10-CM | POA: Diagnosis not present

## 2014-12-08 DIAGNOSIS — R1084 Generalized abdominal pain: Secondary | ICD-10-CM | POA: Insufficient documentation

## 2014-12-08 DIAGNOSIS — F209 Schizophrenia, unspecified: Secondary | ICD-10-CM | POA: Diagnosis not present

## 2014-12-08 DIAGNOSIS — F1010 Alcohol abuse, uncomplicated: Secondary | ICD-10-CM | POA: Diagnosis not present

## 2014-12-08 LAB — URINALYSIS, ROUTINE W REFLEX MICROSCOPIC
Bilirubin Urine: NEGATIVE
GLUCOSE, UA: NEGATIVE mg/dL
Hgb urine dipstick: NEGATIVE
KETONES UR: NEGATIVE mg/dL
Leukocytes, UA: NEGATIVE
NITRITE: NEGATIVE
PROTEIN: NEGATIVE mg/dL
Specific Gravity, Urine: 1.003 — ABNORMAL LOW (ref 1.005–1.030)
Urobilinogen, UA: 0.2 mg/dL (ref 0.0–1.0)
pH: 7.5 (ref 5.0–8.0)

## 2014-12-08 LAB — CBC WITH DIFFERENTIAL/PLATELET
BASOS ABS: 0 10*3/uL (ref 0.0–0.1)
Basophils Relative: 1 %
EOS PCT: 2 %
Eosinophils Absolute: 0.1 10*3/uL (ref 0.0–0.7)
HCT: 39.7 % (ref 36.0–46.0)
Hemoglobin: 12.7 g/dL (ref 12.0–15.0)
LYMPHS ABS: 2.1 10*3/uL (ref 0.7–4.0)
Lymphocytes Relative: 42 %
MCH: 28.8 pg (ref 26.0–34.0)
MCHC: 32 g/dL (ref 30.0–36.0)
MCV: 90 fL (ref 78.0–100.0)
MONOS PCT: 8 %
Monocytes Absolute: 0.4 10*3/uL (ref 0.1–1.0)
NEUTROS ABS: 2.4 10*3/uL (ref 1.7–7.7)
Neutrophils Relative %: 47 %
PLATELETS: 277 10*3/uL (ref 150–400)
RBC: 4.41 MIL/uL (ref 3.87–5.11)
RDW: 14.9 % (ref 11.5–15.5)
WBC: 4.9 10*3/uL (ref 4.0–10.5)

## 2014-12-08 LAB — COMPREHENSIVE METABOLIC PANEL
ALT: 27 U/L (ref 14–54)
AST: 21 U/L (ref 15–41)
Albumin: 3.7 g/dL (ref 3.5–5.0)
Alkaline Phosphatase: 56 U/L (ref 38–126)
Anion gap: 6 (ref 5–15)
BILIRUBIN TOTAL: 0.5 mg/dL (ref 0.3–1.2)
BUN: 7 mg/dL (ref 6–20)
CHLORIDE: 102 mmol/L (ref 101–111)
CO2: 30 mmol/L (ref 22–32)
CREATININE: 0.66 mg/dL (ref 0.44–1.00)
Calcium: 9.1 mg/dL (ref 8.9–10.3)
Glucose, Bld: 103 mg/dL — ABNORMAL HIGH (ref 65–99)
POTASSIUM: 4.2 mmol/L (ref 3.5–5.1)
Sodium: 138 mmol/L (ref 135–145)
Total Protein: 7.3 g/dL (ref 6.5–8.1)

## 2014-12-08 LAB — RAPID URINE DRUG SCREEN, HOSP PERFORMED
Amphetamines: NOT DETECTED
Barbiturates: NOT DETECTED
Benzodiazepines: NOT DETECTED
Cocaine: NOT DETECTED
Opiates: NOT DETECTED
TETRAHYDROCANNABINOL: NOT DETECTED

## 2014-12-08 LAB — ETHANOL: ALCOHOL ETHYL (B): 34 mg/dL — AB (ref <5)

## 2014-12-08 MED ORDER — LORAZEPAM 2 MG/ML IJ SOLN: 0.0000 mg | Freq: Two times a day (BID) | INTRAMUSCULAR | Status: DC

## 2014-12-08 MED ORDER — LORAZEPAM 1 MG PO TABS: 0.0000 mg | ORAL_TABLET | Freq: Four times a day (QID) | ORAL | Status: DC

## 2014-12-08 MED ORDER — THIAMINE HCL 100 MG/ML IJ SOLN
100.0000 mg | Freq: Every day | INTRAMUSCULAR | Status: DC
Start: 2014-12-08 — End: 2014-12-08

## 2014-12-08 MED ORDER — VITAMIN B-1 100 MG PO TABS
100.0000 mg | ORAL_TABLET | Freq: Every day | ORAL | Status: DC
  Administered 2014-12-08: 100 mg via ORAL
  Filled 2014-12-08: qty 1

## 2014-12-08 MED ORDER — LORAZEPAM 2 MG/ML IJ SOLN: 0.0000 mg | Freq: Four times a day (QID) | INTRAMUSCULAR | Status: DC

## 2014-12-08 MED ORDER — THIAMINE HCL 100 MG/ML IJ SOLN
Freq: Once | INTRAVENOUS | Status: AC
  Administered 2014-12-08: 19:00:00 via INTRAVENOUS
  Filled 2014-12-08: qty 1000

## 2014-12-08 MED ORDER — LORAZEPAM 1 MG PO TABS: 0.0000 mg | ORAL_TABLET | Freq: Two times a day (BID) | ORAL | Status: DC

## 2014-12-08 NOTE — ED Notes (Signed)
Pt requesting to leave to go to detox. [place], PA at bedside.

## 2014-12-08 NOTE — Discharge Instructions (Signed)
1. Medications: usual home medications 2. Treatment: rest, drink plenty of fluids 3. Follow Up: please followup with your primary doctor for discussion of your diagnoses and further evaluation after today's visit; if you do not have a primary care doctor use the resource guide provided to find one; please return to the ER for new or worsening symptoms; please use the resource guide below to find a rehab program   Alcohol Intoxication Alcohol intoxication occurs when you drink enough alcohol that it affects your ability to function. It can be mild or very severe. Drinking a lot of alcohol in a short time is called binge drinking. This can be very harmful. Drinking alcohol can also be more dangerous if you are taking medicines or other drugs. Some of the effects caused by alcohol may include:  Loss of coordination.  Changes in mood and behavior.  Unclear thinking.  Trouble talking (slurred speech).  Throwing up (vomiting).  Confusion.  Slowed breathing.  Twitching and shaking (seizures).  Loss of consciousness. HOME CARE  Do not drive after drinking alcohol.  Drink enough water and fluids to keep your pee (urine) clear or pale yellow. Avoid caffeine.  Only take medicine as told by your doctor. GET HELP IF:  You throw up (vomit) many times.  You do not feel better after a few days.  You frequently have alcohol intoxication. Your doctor can help decide if you should see a substance use treatment counselor. GET HELP RIGHT AWAY IF:  You become shaky when you stop drinking.  You have twitching and shaking.  You throw up blood. It may look bright red or like coffee grounds.  You notice blood in your poop (bowel movements).  You become lightheaded or pass out (faint). MAKE SURE YOU:   Understand these instructions.  Will watch your condition.  Will get help right away if you are not doing well or get worse. Document Released: 08/17/2007 Document Revised: 10/31/2012  Document Reviewed: 08/03/2012 [facility] Patient Information 2015 [place], [place]. This information is not intended to replace advice given to you by your health care provider. Make sure you discuss any questions you have with your health care provider.  Alcohol and Nutrition Nutrition serves two purposes. It provides energy. It also maintains body structure and function. Food supplies energy. It also provides the building blocks needed to replace worn or damaged cells. Alcoholics often eat poorly. This limits their supply of essential nutrients. This affects energy supply and structure maintenance. Alcohol also affects the body's nutrients in:  Digestion.  Storage.  Using and getting rid of waste products. IMPAIRMENT OF NUTRIENT DIGESTION AND UTILIZATION   Once ingested, food must be broken down into small components (digested). Then it is available for energy. It helps maintain body structure and function. Digestion begins in the mouth. It continues in the stomach and intestines, with help from the pancreas. The nutrients from digested food are absorbed from the intestines into the blood. Then they are carried to the liver. The liver prepares nutrients for:  Immediate use.  Storage and future use.  Alcohol inhibits the breakdown of nutrients into usable molecules.  It decreases secretion of digestive enzymes from the pancreas.  Alcohol impairs nutrient absorption by damaging the cells lining the stomach and intestines.  It also interferes with moving some nutrients into the blood.  In addition, nutritional deficiencies themselves may lead to further absorption problems.  For example, folate deficiency changes the cells that line the small intestine. This impairs how water is  absorbed. It also affects absorbed nutrients. These include glucose, sodium, and additional folate.  Even if nutrients are digested and absorbed, alcohol can prevent them from being fully used. It changes their  transport, storage, and excretion. Impaired utilization of nutrients by alcoholics is indicated by:  Decreased liver stores of vitamins, such as vitamin A.  Increased excretion of nutrients such as fat. ALCOHOL AND ENERGY SUPPLY   Three basic nutritional components found in food are:  Carbohydrates.  Proteins.  Fats.  These are used as energy. Some alcoholics take in as much as 50% of their total daily calories from alcohol. They often neglect important foods.  Even when enough food is eaten, alcohol can impair the ways the body controls blood sugar (glucose) levels. It may either increase or decrease blood sugar.  In non-diabetic alcoholics, increased blood sugar (hyperglycemia) is caused by poor insulin secretion. It is usually temporary.  Decreased blood sugar (hypoglycemia) can cause serious injury even if this condition is short-lived. Low blood sugar can happen when a fasting or malnourished person drinks alcohol. When there is no food to supply energy, stored sugar is used up. The products of alcohol inhibit forming glucose from other compounds such as amino acids. As a result, alcohol causes the brain and other body tissue to lack glucose. It is needed for energy and function.  Alcohol is an energy source. But how the body processes and uses the energy from alcohol is complex. Also, when alcohol is substituted for carbohydrates, subjects tend to lose weight. This indicates that they get less energy from alcohol than from food. ALCOHOL - MAINTAINING CELL STRUCTURE AND FUNCTION  Structure Cells are made mostly of protein. So an adequate protein diet is important for maintaining cell structure. This is especially true if cells are being damaged. Research indicates that alcohol affects protein nutrition by causing impaired:  Digestion of proteins to amino acids.  Processing of amino acids by the small intestine and liver.  Synthesis of proteins from amino acids.  Protein  secretion by the liver. Function Nutrients are essential for the body to function well. They provide the tools that the body needs to work well:   Proteins.  Vitamins.  Minerals. Alcohol can disrupt body function. It may cause nutrient deficiencies. And it may interfere with the way nutrients are processed. Vitamins  Vitamins are essential to maintain growth and normal metabolism. They regulate many of the body`s processes. Chronic heavy drinking causes deficiencies in many vitamins. This is caused by eating less. And, in some cases, vitamins may be poorly absorbed. For example, alcohol inhibits fat absorption. It impairs how the vitamins A, E, and D are normally absorbed along with dietary fats. Not enough vitamin A may cause night blindness. Not enough vitamin D may cause softening of the bones.  Some alcoholics lack vitamins A, C, D, E, K, and the B vitamins. These are all involved in wound healing and cell maintenance. In particular, because vitamin K is necessary for blood clotting, lacking that vitamin can cause delayed clotting. The result is excess bleeding. Lacking other vitamins involved in brain function may cause severe neurological damage. Minerals Deficiencies of minerals such as calcium, magnesium, iron, and zinc are common in alcoholics. The alcohol itself does not seem to affect how these minerals are absorbed. Rather, they seem to occur secondary to other alcohol-related problems, such as:  Less calcium absorbed.  Not enough magnesium.  More urinary excretion.  Vomiting.  Diarrhea.  Not enough iron due to  gastrointestinal bleeding.  Not enough zinc or losses related to other nutrient deficiencies.  Mineral deficiencies can cause a variety of medical consequences. These range from calcium-related bone disease to zinc-related night blindness and skin lesions. ALCOHOL, MALNUTRITION, AND MEDICAL COMPLICATIONS  Liver Disease   Alcoholic liver damage is caused  primarily by alcohol itself. But poor nutrition may increase the risk of alcohol-related liver damage. For example, nutrients normally found in the liver are known to be affected by drinking alcohol. These include carotenoids, which are the major sources of vitamin A, and vitamin E compounds. Decreases in such nutrients may play some role in alcohol-related liver damage. Pancreatitis  Research suggests that malnutrition may increase the risk of developing alcoholic pancreatitis. Research suggests that a diet lacking in protein may increase alcohol's damaging effect on the pancreas. Brain  Nutritional deficiencies may have severe effects on brain function. These may be permanent. Specifically, thiamine deficiencies are often seen in alcoholics. They can cause severe neurological problems. These include:  Impaired movement.  Memory loss seen in Wernicke-Korsakoff syndrome. Pregnancy  Alcohol has toxic effects on fetal development. It causes alcohol-related birth defects. They include fetal alcohol syndrome. Alcohol itself is toxic to the fetus. Also, the nutritional deficiency can affect how the fetus develops. That may compound the risk of developmental damage.  Nutritional needs during pregnancy are 10% to 30% greater than normal. Food intake can increase by as much as 140% to cover the needs of both mother and fetus. An alcoholic mother`s nutritional problems may adversely affect the nutrition of the fetus. And alcohol itself can also restrict nutrition flow to the fetus. NUTRITIONAL STATUS OF ALCOHOLICS  Techniques for assessing nutritional status include:  Taking body measurements to estimate fat reserves. They include:  Weight.  Height.  Mass.  Skin fold thickness.  Performing blood analysis to provide measurements of circulating:  Proteins.  Vitamins.  Minerals.  These techniques tend to be imprecise. For many nutrients, there is no clear "cut-off" point that would allow an  accurate definition of deficiency. So assessing the nutritional status of alcoholics is limited by these techniques. Dietary status may provide information about the risk of developing nutritional problems. Dietary status is assessed by:  Taking patients' dietary histories.  Evaluating the amount and types of food they are eating.  It is difficult to determine what exact amount of alcohol begins to have damaging effects on nutrition. In general, moderate drinkers have 2 drinks or less per day. They seem to be at little risk for nutritional problems. Various medical disorders begin to appear at greater levels.  Research indicates that the majority of even the heaviest drinkers have few obvious nutritional deficiencies. Many alcoholics who are hospitalized for medical complications of their disease do have severe malnutrition. Alcoholics tend to eat poorly. Often they eat less than the amounts of food necessary to provide enough:  Carbohydrates.  Protein.  Fat.  Vitamins A and C.  B vitamins.  Minerals like calcium and iron. Of major concern is alcohol's effect on digesting food and use of nutrients. It may shift a mildly malnourished person toward severe malnutrition. Document Released: 12/23/2004 Document Revised: 05/23/2011 Document Reviewed: 06/08/2005 [facility] Patient Information 2015 [place], [place]. This information is not intended to replace advice given to you by your health care provider. Make sure you discuss any questions you have with your health care provider.   Emergency Department Resource Guide 1) Find a Doctor and Pay Out of Pocket Although you won't have to find  out who is covered by your insurance plan, it is a good idea to ask around and get recommendations. You will then need to call the office and see if the doctor you have chosen will accept you as a new patient and what types of options they offer for patients who are self-pay. Some doctors offer discounts or will  set up payment plans for their patients who do not have insurance, but you will need to ask so you aren't surprised when you get to your appointment.  2) Contact Your Local Health Department Not all health departments have doctors that can see patients for sick visits, but many do, so it is worth a call to see if yours does. If you don't know where your local health department is, you can check in your phone book. The CDC also has a tool to help you locate your state's health department, and many state websites also have listings of all of their local health departments.  3) Find a Walk-in Clinic If your illness is not likely to be very severe or complicated, you may want to try a walk in clinic. These are popping up all over the country in pharmacies, drugstores, and shopping centers. They're usually staffed by nurse practitioners or physician assistants that have been trained to treat common illnesses and complaints. They're usually fairly quick and inexpensive. However, if you have serious medical issues or chronic medical problems, these are probably not your best option.  No Primary Care Doctor: - Call Health Connect at  [phone] - they can help you locate a primary care doctor that  accepts your insurance, provides certain services, etc. - Physician Referral Service- [phone]  Chronic Pain Problems: Organization         Address  Phone   Notes  [facility]  [phone] Patients need to be referred by their primary care doctor.   Medication Assistance: Organization         Address  Phone   Notes  [facility] Medication [facility] [address] [place] [address] [place], [place] [address] [phone] --Must be a resident of [facility] -- Must have NO insurance coverage whatsoever (no Medicaid/ Medicare, etc.) -- The pt. MUST have a primary care doctor that directs their care regularly and follows them in the community   [facility]ist  [phone]    [employer]  [phone]    Agencies that provide inexpensive medical care: Organization         Address  Phone   Notes  [facility]  [phone]   [facility]    [phone]   [facility] [address] [place], [place] [address] [phone]   [facility] [address] [place], [place] [phone]   [facility]anned Parenthood    [phone]   [facility]    [phone]   [facility] and [facility]  [address], [place] Phone:  [phone], Fax:  [phone] Hours of Operation:  9 am - 6 pm, M-F.  Also accepts Medicaid/Medicare and self-pay.  [facility]  [address], [place] Phone: [phone], Fax: [phone]. Hours of Operation:  8:30 am - 5:30 pm, M-F.  Also accepts Medicaid and self-pay.  [facility] [place] [address], [employer] Phone: [phone]   [facility] [address], [place],  [place] [phone], Ext. [phone] Mondays & Thursdays: 7-9 AM.  First 15 patients are seen on a first come, first serve basis.    Medicaid-accepting [facility] Providers:  Organization         Address  Phone   Notes  [facility] [address], [place] [phone] Also accepts self-pay patients.  [facility] [address] Laurell Josephs [place], [place]  [phone]   [facility] [address], [place] [phone]   [facility] [address] [place] [phone]   Renaye Rakers [address], [place]   [phone] Only accepts [place] Access [place] patients after they have their name applied to their card.   Self-Pay (no insurance) in [facility]:  Organization         Address  Phone   Notes  Sickle Cell Patients, [facility] Internal Medicine [address] [place],  [place] [phone]   [facility] Urgent Care [address] [place], [place] [phone]   [facility] [place]  [address], [place] [phone]   [facility]/Dr. Osei-Bonsu  [address] [place] or [address], [place] [phone] Phone number for both [place] and [place] locations is the same.  Urgent Medical and [facility] [address] [place] [phone]   [facility] [address] [place] or [address] [phone]   [facility] [address], [place] [phone], phone; [phone], fax Sees patients 1st and 3rd Saturday of every month.  Must not qualify for public or private insurance (i.e. Medicaid, Medicare, [place] Health Choice, Veterans' Benefits)  Household income should be no more than 200% of the poverty level The clinic cannot treat you if you are pregnant or think you are pregnant  Sexually transmitted diseases are not treated at the clinic.    Dental Care: Organization         Address  Phone  Notes  [facility] Department of [facility] [address] [place], [place] [phone] Accepts children up to age 80 who are enrolled in [place] or [place] Health Choice; pregnant women with a Medicaid card; and children who have applied for Medicaid or [place] Health Choice, but were declined, whose parents can pay a reduced fee at time of service.  [facility] of [facility]  [address], [place] [phone] Accepts children up to age 76 who are enrolled in [place] or [place] Health Choice; pregnant women with a Medicaid card; and children who have applied for Medicaid or [place] Health Choice, but were declined, whose parents can pay a reduced fee at time of service.  [facility]  [address] [place], [place] [phone] Patients are seen by appointment only. Walk-ins are not accepted. [facility] will see patients 78 years of age and older. Monday - Tuesday (8am-5pm) Most Wednesdays (8:30-5pm) $30 per visit, cash only  [facility]  [address], [facility] [phone] Patients are seen by appointment only. Walk-ins are not accepted. [facility] will see patients 79 years of age and older. One Wednesday Evening (Monthly: Volunteer Based).  $30 per visit, cash only  [employer]  (  [phone] for adults; Children under age 81, call Graduate Pediatric Dentistry at [phone]. Children aged 13-14, please call [phone] to request a pediatric application.  Dental services are provided in all areas of dental care including fillings, crowns and bridges, complete and partial dentures, implants, gum treatment, root canals, and extractions. Preventive care is also provided. Treatment is provided to both adults and children. Patients are selected via a lottery and there is often a waiting list.   [facility] [address] [place]  [phone] [email or web address]   [facility] [address] [place], [place] [phone], Ext. [phone] Second and Fourth Thursday of each month, opens at 6:30 AM; Clinic ends at 9 AM.  Patients are seen on a first-come first-served basis, and a limited number are seen during each clinic.   [facility]  [address] Hillard Danker [place], [place] [phone]   Eligibility Requirements You must have lived in [place], [place], or [place] counties for at least the last three months.   You cannot be eligible for state or federal sponsored [employer], including [employer], [place], or [employer].   You generally cannot be eligible for healthcare insurance through your employer.    How to apply: Eligibility screenings are held every Tuesday and Wednesday afternoon  from 1:00 pm until 4:00 pm. You do not need an appointment for the interview!  [facility] [address], [place], [place]   [place] Department  [place]  [phone]    Behavioral Health Resources in the Community: Intensive Outpatient Programs Organization         Address  Phone  Notes  [place]. [address] [place], [place] [phone]   [facility] Outpatient [address] [place], [place]   [facility]: [facility] [address] [place], [place]   [place] [address],  [place], [place] or [phone]   Substance Abuse Resources Organization         Address  Phone  Notes  Al[facility] Drug Services  [phone]   [place]  [phone]   [facility]   Chinita Pester  [phone]   Residential & Outpatient Substance Abuse Program  [phone]   Psychological Services Organization         Address  Phone  Notes  [facility]  [place]  [phone]   [place] [address], [place] or [phone]    Mobile Crisis Teams Organization         Address  Phone  Notes  [facility], Mobile Crisis Care Unit  [phone]   [facility]  [address] [place], [place]   [place] [address], [place] [phone]    Self-Help/Support Groups Organization         Address  Phone             Notes  [place]. of [place] - variety of support groups  [place] Call for more information  Narcotics Anonymous (NA), [facility] [address], [employer] [place]  2 meetings at this location   Cytogeneticist  Notes  [facility] [address],    [place]  [phone]   [facility]  [address] [place] [address], [place], [place] [phone]   [facility] [address] [place], [place] [phone] Admissions: 8am-3pm M-F  [facility] [address],    [place], [place] [phone]   [facility] [address] [place], [place], [place] [phone]   [facility] [address],  [place], [place] [phone]   [facility] - Intensive Outpatient [address] Laure[address] Josephs [address], [place], [place] [phone]   [facility] [address] [place]  [place], [place] [phone] or [phone]   [facility] ([facility]) [address], [place], [place] [phone] Accepts Medicaid  [facility] [place] [address],  [place] [phone] Substance Abuse/Addiction Treatment   [facility] Organization         Address  Phone  Notes  [facility]  [phone]   Angie Fava, PhD [address] Ervin Knack [place], [place]   [phone] or [phone]   [facility]   [address] [place], [place] [phone]   [facility] [address], [place], [place] [phone] Insurance/Medicaid/sponsorship through [facility] and Families [address]                                    [place], [place] [phone] Therapy/tele-psych/case  [facility] [address], [place] [phone]    Dr. Lolly Mustache  [phone]   [facility]  [employer] [facility] Dept. 1) [address] [place] 2) [address], [place] 3)  [address], [place] [phone]  [phone]   [facility] Child Abuse Hotline [phone] or [phone] (After Hours)

## 2014-12-08 NOTE — ED Notes (Signed)
Pt states, "hey can I talk to you. Where is the doctor at I've been here for hours and I still haven't seen the doctor. Can you get me something to eat I haven't had anything since I've been here." Pt told the nurse would make sure it was ok to give her something to eat. Pt then stated, "No you won't check with the doctor you better give me something to eat or I will tell the police! I don't want to talk to you."

## 2014-12-08 NOTE — ED Notes (Signed)
Per EMS, [facility] from [facility].  Pt c/o alcohol problem.  Wants help with detox with rehab.  Admits to cocaine use on Saturday.  Last drink 30 min prior to call.  Total today 3 - 40 oz.  Vitals: 150/90, hr 90, resp 22, cbg 105

## 2014-12-08 NOTE — ED Provider Notes (Signed)
CSN: [id number]     Arrival date & time 12/08/14  1523 History   First MD Initiated Contact with Patient 12/08/14 1740     Chief Complaint  Patient presents with  . Alcohol Problem     HPI   [name] is a 54 y.o. female with a PMH of substance abuse, bipolar, schizoaffective disorder who presents to the ED with alcohol abuse, requesting detox. She states she has been drinking every day for the past month and has not been eating. She reports she drank 4 40 oz beers and a bottle of wine today. She reports lightheadedness, dizziness, and low back pain. She denies history of malignancy, anticoagulant use, IVDU, bowel or bladder incontinence, saddle anesthesia. She denies fever, chills, loss of consciousness, chest pain, shortness of breath. She reports diffuse abdominal pain and nausea. She denies vomiting, diarrhea, constipation, dysuria, urgency, frequency. She denies recent injury.   Past Medical History  Diagnosis Date  . Arthritis   . Seizures   . Psychiatric illness   . Bipolar 1 disorder   . Schizo-affective schizophrenia   . Obesity   . Substance abuse    Past Surgical History  Procedure Laterality Date  . Stomach surgery     Family History  Problem Relation Age of Onset  . Alzheimer's disease Mother   . Depression Mother    Social History  Substance Use Topics  . Smoking status: Current Every Day Smoker -- 1.00 packs/day    Types: Cigarettes  . Smokeless tobacco: Never Used  . Alcohol Use: 3.6 oz/week    6 Cans of beer per week   OB History    No data available       Review of Systems  Constitutional: Negative for fever, chills and fatigue.  Respiratory: Negative for shortness of breath.   Cardiovascular: Negative for chest pain.  Gastrointestinal: Positive for nausea and abdominal pain. Negative for vomiting, diarrhea and constipation.  Genitourinary: Negative for dysuria, urgency and frequency.  Musculoskeletal: Positive for back pain.  Neurological:  Positive for dizziness and light-headedness. Negative for weakness and headaches.  All other systems reviewed and are negative.     Allergies  Review of patient's allergies indicates no known allergies.  Home Medications   Prior to Admission medications   Medication Sig Start Date End Date Taking? Authorizing Provider  amantadine (SYMMETREL) 100 MG capsule Take 100 mg by mouth 2 (two) times daily.    Historical Provider, MD  divalproex (DEPAKOTE ER) 500 MG 24 hr tablet Take 1 tablet (500 mg total) by mouth 2 (two) times daily. Patient taking differently: Take 1,000 mg by mouth 2 (two) times daily.  04/02/13   Zannie Cove, MD  hydrOXYzine (VISTARIL) 25 MG capsule Take 25 mg by mouth 2 (two) times daily.    Historical Provider, MD  QUEtiapine (SEROQUEL) 50 MG tablet Take 50 mg by mouth at bedtime.    Historical Provider, MD  risperiDONE (RISPERDAL) 2 MG tablet Take 1 tablet (2 mg total) by mouth 2 (two) times daily. Take 2 mg po  Bid daily 08/28/14   Earney Navy, NP  risperiDONE microspheres (RISPERDAL CONSTA) 50 MG injection Inject 50 mg into the muscle every 14 (fourteen) days.    Historical Provider, MD    BP 141/82 mmHg  Pulse 84  Temp(Src) 98.6 F (37 C) (Oral)  Resp 16  SpO2 97%  LMP 01/13/2012 Physical Exam  Constitutional: She is oriented to person, place, and time.  Obese female, no acute  distress.  HENT:  Head: Normocephalic and atraumatic.  Right Ear: External ear normal.  Left Ear: External ear normal.  Nose: Nose normal.  Mouth/Throat: Oropharynx is clear and moist.  Eyes: Conjunctivae and EOM are normal. Pupils are equal, round, and reactive to light.  Neck: Normal range of motion. Neck supple.  Cardiovascular: Normal rate, regular rhythm, normal heart sounds and intact distal pulses.   Pulmonary/Chest: Effort normal. No respiratory distress. She has no wheezes. She has no rales.  Abdominal: Soft. Bowel sounds are normal. She exhibits no distension and no  mass. There is tenderness. There is no rebound and no guarding.  Mild diffuse TTP. No rebound, guarding, or masses.  Musculoskeletal: Normal range of motion. She exhibits tenderness. She exhibits no edema.  Mild TTP of lumbar paraspinal muscles. No palpable step-off or deformity.  Neurological: She is alert and oriented to person, place, and time. She has normal strength. No cranial nerve deficit or sensory deficit.  Skin: Skin is warm and dry. No rash noted. No erythema. No pallor.  Nursing note and vitals reviewed.   ED Course  Procedures (including critical care time)  Labs Review Labs Reviewed  COMPREHENSIVE METABOLIC PANEL - Abnormal; Notable for the following:    Glucose, Bld 103 (*)    All other components within normal limits  ETHANOL - Abnormal; Notable for the following:    Alcohol, Ethyl (B) 34 (*)    All other components within normal limits  URINALYSIS, ROUTINE W REFLEX MICROSCOPIC (NOT AT [facility]) - Abnormal; Notable for the following:    Specific Gravity, Urine 1.003 (*)    All other components within normal limits  CBC WITH DIFFERENTIAL/PLATELET  URINE RAPID DRUG SCREEN, HOSP PERFORMED    Imaging Review No results found.   I have personally reviewed and evaluated these lab results as part of my medical decision-making.   EKG Interpretation None      MDM   Final diagnoses:  Alcohol abuse    54 year old female presents with alcohol abuse, requesting detox.  Patient is afebrile with stable vital signs. Heart RRR. Lungs clear to auscultation bilaterally. Abdomen with diffuse TTP, no rebound, guarding, or palpable masses. Normal neuro exam with no focal deficit. Patient is alert and oriented x 4.  CBC, CMP unremarkable. Ethanol mildly elevated at 34. UDS negative. UA no evidence of infection.  Patient is requesting to eat and drink, which she tolerates well. Patient does not appear clinically intoxicated and is asking to leave. Feel she is stable for discharge  at this time. Patient given resource list for outpatient rehab. Patient states she has a case manager who she will contact regarding this. Return precautions discussed.  BP 141/82 mmHg  Pulse 84  Temp(Src) 98.6 F (37 C) (Oral)  Resp 16  SpO2 97%  LMP 01/13/2012     Mady Gemma, PA-C 12/09/14 1150  Marily Memos, MD 12/11/14 [phone]

## 2014-12-08 NOTE — ED Notes (Signed)
Pt too intoxicated to ambulate to the restroom at this time.

## 2015-04-12 ENCOUNTER — Emergency Department (HOSPITAL_COMMUNITY)
Admission: EM | Admit: 2015-04-12 | Discharge: 2015-04-12 | Disposition: A | Discharge status: Home / Self Care | Payer: Medicare Other | Attending: Emergency Medicine | Admitting: Emergency Medicine

## 2015-04-12 ENCOUNTER — Encounter (HOSPITAL_COMMUNITY): Payer: Self-pay

## 2015-04-12 ENCOUNTER — Emergency Department (HOSPITAL_COMMUNITY): Payer: Medicare Other

## 2015-04-12 DIAGNOSIS — F39 Unspecified mood [affective] disorder: Secondary | ICD-10-CM | POA: Insufficient documentation

## 2015-04-12 DIAGNOSIS — E669 Obesity, unspecified: Secondary | ICD-10-CM | POA: Insufficient documentation

## 2015-04-12 DIAGNOSIS — R000 Tachycardia, unspecified: Secondary | ICD-10-CM | POA: Insufficient documentation

## 2015-04-12 DIAGNOSIS — Z79899 Other long term (current) drug therapy: Secondary | ICD-10-CM | POA: Diagnosis not present

## 2015-04-12 DIAGNOSIS — F17210 Nicotine dependence, cigarettes, uncomplicated: Secondary | ICD-10-CM | POA: Insufficient documentation

## 2015-04-12 DIAGNOSIS — F250 Schizoaffective disorder, bipolar type: Secondary | ICD-10-CM | POA: Diagnosis not present

## 2015-04-12 DIAGNOSIS — F1010 Alcohol abuse, uncomplicated: Secondary | ICD-10-CM | POA: Diagnosis not present

## 2015-04-12 DIAGNOSIS — Z8739 Personal history of other diseases of the musculoskeletal system and connective tissue: Secondary | ICD-10-CM | POA: Insufficient documentation

## 2015-04-12 DIAGNOSIS — F319 Bipolar disorder, unspecified: Secondary | ICD-10-CM | POA: Insufficient documentation

## 2015-04-12 DIAGNOSIS — F1410 Cocaine abuse, uncomplicated: Secondary | ICD-10-CM | POA: Diagnosis not present

## 2015-04-12 DIAGNOSIS — R002 Palpitations: Secondary | ICD-10-CM | POA: Diagnosis present

## 2015-04-12 LAB — COMPREHENSIVE METABOLIC PANEL
ALT: 19 U/L (ref 14–54)
ANION GAP: 10 (ref 5–15)
AST: 16 U/L (ref 15–41)
Albumin: 4.1 g/dL (ref 3.5–5.0)
Alkaline Phosphatase: 64 U/L (ref 38–126)
BILIRUBIN TOTAL: 0.7 mg/dL (ref 0.3–1.2)
BUN: 16 mg/dL (ref 6–20)
CO2: 28 mmol/L (ref 22–32)
Calcium: 9.9 mg/dL (ref 8.9–10.3)
Chloride: 105 mmol/L (ref 101–111)
Creatinine, Ser: 0.93 mg/dL (ref 0.44–1.00)
GFR calc Af Amer: >60 mL/min (ref >60)
Glucose, Bld: 78 mg/dL (ref 65–99)
POTASSIUM: 4.3 mmol/L (ref 3.5–5.1)
Sodium: 143 mmol/L (ref 135–145)
TOTAL PROTEIN: 8.1 g/dL (ref 6.5–8.1)

## 2015-04-12 LAB — CBC
HEMATOCRIT: 46.4 % — AB (ref 36.0–46.0)
Hemoglobin: 15.3 g/dL — ABNORMAL HIGH (ref 12.0–15.0)
MCH: 29.1 pg (ref 26.0–34.0)
MCHC: 33 g/dL (ref 30.0–36.0)
MCV: 88.2 fL (ref 78.0–100.0)
Platelets: 270 10*3/uL (ref 150–400)
RBC: 5.26 MIL/uL — ABNORMAL HIGH (ref 3.87–5.11)
RDW: 14.8 % (ref 11.5–15.5)
WBC: 5.3 10*3/uL (ref 4.0–10.5)

## 2015-04-12 LAB — RAPID URINE DRUG SCREEN, HOSP PERFORMED
AMPHETAMINES: NOT DETECTED
BARBITURATES: NOT DETECTED
BENZODIAZEPINES: NOT DETECTED
Cocaine: POSITIVE — AB
Opiates: NOT DETECTED
TETRAHYDROCANNABINOL: NOT DETECTED

## 2015-04-12 LAB — ETHANOL

## 2015-04-12 LAB — SALICYLATE LEVEL: Salicylate Lvl: <4 mg/dL (ref 2.8–30.0)

## 2015-04-12 LAB — ACETAMINOPHEN LEVEL: Acetaminophen (Tylenol), Serum: <10 ug/mL — ABNORMAL LOW (ref 10–30)

## 2015-04-12 LAB — I-STAT BETA HCG BLOOD, ED (MC, WL, AP ONLY)

## 2015-04-12 MED ORDER — LORAZEPAM 1 MG PO TABS
2.0000 mg | ORAL_TABLET | Freq: Once | ORAL | Status: AC
  Administered 2015-04-12: 2 mg via ORAL
  Filled 2015-04-12: qty 2

## 2015-04-12 NOTE — BH Assessment (Signed)
Spoke with EDP regarding disposition who states that she will discharge patient. Contacted Peyton Najjar at [facility] ACT Team and informed him that patient is ready for discharge.   Davina Poke, LCSW Therapeutic Triage Specialist [facility] 04/12/2015 2:27 PM

## 2015-04-12 NOTE — ED Notes (Signed)
Pt having flight of ideas.  Pt's story is continually changing, but "problems with my heart" remain constant.  Pt talks very loudly, but will follow commands and continually compliments staff.

## 2015-04-12 NOTE — BH Assessment (Signed)
Contacted [facility] and spoke with Peyton Najjar who states that he will come to get the patient when she is ready. Informed EDP of recommendation.   Davina Poke, LCSW Therapeutic Triage Specialist [facility] 04/12/2015 1:47 PM

## 2015-04-12 NOTE — ED Notes (Signed)
Pt given all her belongings. Crisis case manager is here to d/c informed doc Madilyn Hook.

## 2015-04-12 NOTE — ED Notes (Addendum)
[facility] EMS[place] [facility] [place] [facility], [place] wanting detox from ETOH and cocaine.  Hx of Bipolar 1, schizo-affective schizophrenia, and substance abuse.  Pt reports taking all medications.      Pt c/o chest palpitations starting this morning and asking for ETOH and cocaine detox.  Pt reports doing "alot" of drugs yesterday and "2 cans of beer last night."  Denies SI, HI, and AV.  Pt sts she is concerned that someone might hurt her, unless she "gets off the drugs."

## 2015-04-12 NOTE — ED Notes (Signed)
Pt changed and wanded by security.  Pt will wait in TCU for ACT team.

## 2015-04-12 NOTE — BH Assessment (Signed)
Assessment completed.  Consulted with Nanine Means, DNP who recommends patient be discharged and to contact [facility] for transportation and patient will be discharged with shelter resources.   Davina Poke, LCSW Therapeutic Triage Specialist [facility] 04/12/2015 1:44 PM

## 2015-04-12 NOTE — BHH Counselor (Signed)
Spoke with patient to inform her that she would be discharged to her ACT Team. Patient states "i could have kept my clothes on." Patient was informed that Peyton Najjar would come to pick her up. Patient states that Peyton Najjar is the person who told her that he would get her into a substance abuse program and has not gotten her into a program yet.    Davina Poke, LCSW Therapeutic Triage Specialist [facility] 04/12/2015 2:51 PM

## 2015-04-12 NOTE — Clinical Social Work Note (Signed)
CSW met with pt at bedside to discuss discharge needs.  CSW provided resources for pt regarding shelters, crisis services and other medication and mental health needs. Pt reported that she has support in the community stating "case managers, friends and family."  She discussed that her case manager is helping her with long term housing and she is working with [facility].  Pt accepted resources and asked for chicken salad.  Nurse tech was accommodating pt in her request for an additional meal.  Dede Query LCSW

## 2015-04-12 NOTE — ED Notes (Signed)
Pt yelling wanting chicken salad. Explained the kitchen only has tuna. Pt states she does not want the foods we have.
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
## 2015-04-12 NOTE — ED Provider Notes (Signed)
CSN: [id number]     Arrival date & time 04/12/15  1028 History   First MD Initiated Contact with Patient 04/12/15 1128     Chief Complaint  Patient presents with  . Detox    . Palpitations     Patient is a 55 y.o. female presenting with palpitations. The history is provided by the patient. No language interpreter was used.  Palpitations  Falana Clagg is a 55 y.o. female who presents to the Emergency Department complaining of psychiatric evaluation.  Level V caveat due to psychiatric illness.  Patient was brought in from church due to being disruptive during church service. Patient states she liked to get detox from crack. She states she had palpitations. She also states she would like to go. She states that she is compliant with her schizophrenia medicine. She denies any somatic complaints other than palpitations earlier that are now gone. She states that she drinks alcohol and does crack occasionally but she does not want to do these things. She denies any SI, HI, hallucinations.  Past Medical History  Diagnosis Date  . Arthritis   . Seizures (HCC)   . Psychiatric illness   . Bipolar 1 disorder (HCC)   . Schizo-affective schizophrenia (HCC)   . Obesity   . Substance abuse    Past Surgical History  Procedure Laterality Date  . Stomach surgery     Family History  Problem Relation Age of Onset  . Alzheimer's disease Mother   . Depression Mother    Social History  Substance Use Topics  . Smoking status: Current Every Day Smoker -- 1.00 packs/day    Types: Cigarettes  . Smokeless tobacco: Never Used  . Alcohol Use: 3.6 oz/week    6 Cans of beer per week   OB History    No data available     Review of Systems  Cardiovascular: Positive for palpitations.  All other systems reviewed and are negative.     Allergies  Review of patient's allergies indicates no known allergies.  Home Medications   Prior to Admission medications   Medication Sig Start Date End Date  Taking? Authorizing Provider  amantadine (SYMMETREL) 100 MG capsule Take 100 mg by mouth 2 (two) times daily.   Yes Historical Provider, MD  divalproex (DEPAKOTE ER) 500 MG 24 hr tablet Take 1 tablet (500 mg total) by mouth 2 (two) times daily. Patient taking differently: Take 1,000 mg by mouth 2 (two) times daily.  04/02/13  Yes Zannie Cove, MD  hydrOXYzine (VISTARIL) 25 MG capsule Take 25 mg by mouth 2 (two) times daily.   Yes Historical Provider, MD  QUEtiapine (SEROQUEL) 50 MG tablet Take 50 mg by mouth at bedtime.   Yes Historical Provider, MD  risperiDONE (RISPERDAL) 2 MG tablet Take 1 tablet (2 mg total) by mouth 2 (two) times daily. Take 2 mg po  Bid daily 08/28/14  Yes Earney Navy, NP  risperiDONE microspheres (RISPERDAL CONSTA) 50 MG injection Inject 50 mg into the muscle every 14 (fourteen) days.   Yes Historical Provider, MD   BP 160/127 mmHg  Pulse 118  Temp(Src) 97.5 F (36.4 C) (Oral)  Resp 20  SpO2 98%  LMP 01/13/2012 Physical Exam  Constitutional: She is oriented to person, place, and time. She appears well-developed and well-nourished.  HENT:  Head: Normocephalic and atraumatic.  Cardiovascular: Regular rhythm.   No murmur heard. Tachycardic  Pulmonary/Chest: Effort normal and breath sounds normal. No respiratory distress.  Abdominal: Soft. There is no  tenderness. There is no rebound and no guarding.  Musculoskeletal: She exhibits no edema or tenderness.  Neurological: She is alert and oriented to person, place, and time.  Skin: Skin is warm and dry.  Psychiatric:  Loud, pressured speech with flight of ideas. Tangential thought process. Difficult to redirect at times.  Nursing note and vitals reviewed.   ED Course  Procedures (including critical care time) Labs Review Labs Reviewed  CBC - Abnormal; Notable for the following:    RBC 5.26 (*)    Hemoglobin 15.3 (*)    HCT 46.4 (*)    All other components within normal limits  ACETAMINOPHEN LEVEL -  Abnormal; Notable for the following:    Acetaminophen (Tylenol), Serum <10 (*)    All other components within normal limits  URINE RAPID DRUG SCREEN, HOSP PERFORMED - Abnormal; Notable for the following:    Cocaine POSITIVE (*)    All other components within normal limits  COMPREHENSIVE METABOLIC PANEL  ETHANOL  SALICYLATE LEVEL  I-STAT BETA HCG BLOOD, ED ([facility], [facility], [facility] ONLY)    Imaging Review Dg Chest 2 View  04/12/2015  CLINICAL DATA:  Chest palpitations beginning this morning. EXAM: CHEST  2 VIEW COMPARISON:  04/18/2014 FINDINGS: Heart size is normal. Mediastinal shadows are normal. The lungs are clear. No effusions. The vascularity is normal. IMPRESSION: Normal chest Electronically Signed   By: Paulina Fusi M.D.   On: 04/12/2015 11:14   I have personally reviewed and evaluated these images and lab results as part of my medical decision-making.   EKG Interpretation   Date/Time:  Sunday April 12 2015 10:47:14 EST Ventricular Rate:  112 PR Interval:  113 QRS Duration: 75 QT Interval:  316 QTC Calculation: 431 R Axis:   57 Text Interpretation:  Sinus tachycardia Ventricular premature complex  Aberrant complex Right atrial enlargement Probable LVH with secondary  repol abnrm consistent with old with rate related ST changes Confirmed by  Donnald Garre, MD, Lebron Conners [phone]) on 04/12/2015 11:01:22 AM      MDM   Final diagnoses:  Mood disorder [facility])    Patient here for seeking detox from crack cocaine and alcohol. She has pressured speech with flight of ideas very difficult to redirect. She was given Ativan for her agitation. On repeat evaluation as she is calm and appropriate. She has been evaluated by TTS with plan to DC home in the care of her ACT team.  Tilden Fossa, MD 04/12/15 [phone]

## 2015-04-12 NOTE — ED Notes (Signed)
DELAY IN BLOOD WORK DUE TO PT NOT BEING IN ROOM

## 2015-04-12 NOTE — ED Notes (Signed)
PT REFUSED BLOOD DRAW. 

## 2015-04-12 NOTE — ED Notes (Signed)
TTS at bedside. 

## 2015-04-12 NOTE — ED Notes (Signed)
Pt reported to [place] [facility] that she needs somewhere to stay until the beginning of the month and she gets her check.  Sts she lives somewhere, but they aren't treating her good, "because they have someone else moving in."  Sts they are making her "do all the work."

## 2015-04-12 NOTE — ED Notes (Signed)
BLOOD DRAW UNSUCCESSFUL 

## 2015-04-12 NOTE — Discharge Instructions (Signed)
Bipolar Disorder °Bipolar disorder is a mental illness. The term bipolar disorder actually is used to describe a group of disorders that all share varying degrees of emotional highs and lows that can interfere with daily functioning, such as work, school, or relationships. Bipolar disorder also can lead to drug abuse, hospitalization, and suicide. °The emotional highs of bipolar disorder are periods of elation or irritability and high energy. These highs can range from a mild form (hypomania) to a severe form (mania). People experiencing episodes of hypomania may appear energetic, excitable, and highly productive. People experiencing mania may behave impulsively or erratically. They often make poor decisions. They may have difficulty sleeping. The most severe episodes of mania can involve having very distorted beliefs or perceptions about the world and seeing or hearing things that are not real (psychotic delusions and hallucinations).  °The emotional lows of bipolar disorder (depression) also can range from mild to severe. Severe episodes of bipolar depression can involve psychotic delusions and hallucinations. °Sometimes people with bipolar disorder experience a state of mixed mood. Symptoms of hypomania or mania and depression are both present during this mixed-mood episode. °SIGNS AND SYMPTOMS °There are signs and symptoms of the episodes of hypomania and mania as well as the episodes of depression. The signs and symptoms of hypomania and mania are similar but vary in severity. They include: °· Inflated self-esteem or feeling of increased self-confidence. °· Decreased need for sleep. °· Unusual talkativeness (rapid or pressured speech) or the feeling of a need to keep talking. °· Sensation of racing thoughts or constant talking, with quick shifts between topics that may or may not be related (flight of ideas). °· Decreased ability to focus or concentrate. °· Increased purposeful activity, such as work, studies,  or social activity, or nonproductive activity, such as pacing, squirming and fidgeting, or finger and toe tapping. °· Impulsive behavior and use of poor judgment, resulting in high-risk activities, such as having unprotected sex or spending excessive amounts of money. °Signs and symptoms of depression include the following:  °· Feelings of sadness, hopelessness, or helplessness. °· Frequent or uncontrollable episodes of crying. °· Lack of feeling anything or caring about anything. °· Difficulty sleeping or sleeping too much.  °· Inability to enjoy the things you used to enjoy.   °· Desire to be alone all the time.   °· Feelings of guilt or worthlessness.  °· Lack of energy or motivation.   °· Difficulty concentrating, remembering, or making decisions.  °· Change in appetite or weight beyond normal fluctuations. °· Thoughts of death or the desire to harm yourself. °DIAGNOSIS  °Bipolar disorder is diagnosed through an assessment by your caregiver. Your caregiver will ask questions about your emotional episodes. There are two main types of bipolar disorder. People with type I bipolar disorder have manic episodes with or without depressive episodes. People with type II bipolar disorder have hypomanic episodes and major depressive episodes, which are more serious than mild depression. The type of bipolar disorder you have can make an important difference in how your illness is monitored and treated. °Your caregiver may ask questions about your medical history and use of alcohol or drugs, including prescription medication. Certain medical conditions and substances also can cause emotional highs and lows that resemble bipolar disorder (secondary bipolar disorder).  °TREATMENT  °Bipolar disorder is a long-term illness. It is best controlled with continuous treatment rather than treatment only when symptoms occur. The following treatments can be prescribed for bipolar disorders: °· Medication--Medication can be prescribed by  a doctor that   is an expert in treating mental disorders (psychiatrists). Medications called mood stabilizers are usually prescribed to help control the illness. Other medications are sometimes added if symptoms of mania, depression, or psychotic delusions and hallucinations occur despite the use of a mood stabilizer. °· Talk therapy--Some forms of talk therapy are helpful in providing support, education, and guidance. °A combination of medication and talk therapy is best for managing the disorder over time. A procedure in which electricity is applied to your brain through your scalp (electroconvulsive therapy) is used in cases of severe mania when medication and talk therapy do not work or work too slowly. °  °This information is not intended to replace advice given to you by your health care provider. Make sure you discuss any questions you have with your health care provider. °  °Document Released: 06/06/2000 Document Revised: 03/21/2014 Document Reviewed: 03/26/2012 °Elsevier Interactive Patient Education ©2016 Elsevier Inc. ° °Polysubstance Abuse °When people abuse more than one drug or type of drug it is called polysubstance or polydrug abuse. For example, many smokers also drink alcohol. This is one form of polydrug abuse. Polydrug abuse also refers to the use of a drug to counteract an unpleasant effect produced by another drug. It may also be used to help with withdrawal from another drug. People who take stimulants may become agitated. Sometimes this agitation is countered with a tranquilizer. This helps protect against the unpleasant side effects. Polydrug abuse also refers to the use of different drugs at the same time.  °Anytime drug use is interfering with normal living activities, it has become abuse. This includes problems with family and friends. Psychological dependence has developed when your mind tells you that the drug is needed. This is usually followed by physical dependence which has developed  when continuing increases of drug are required to get the same feeling or "high". This is known as addiction or chemical dependency. A person's risk is much higher if there is a history of chemical dependency in the family. °SIGNS OF CHEMICAL DEPENDENCY °· You have been told by friends or family that drugs have become a problem. °· You fight when using drugs. °· You are having blackouts (not remembering what you do while using). °· You feel sick from using drugs but continue using. °· You lie about use or amounts of drugs (chemicals) used. °· You need chemicals to get you going. °· You are suffering in work performance or in school because of drug use. °· You get sick from use of drugs but continue to use anyway. °· You need drugs to relate to people or feel comfortable in social situations. °· You use drugs to forget problems. °"Yes" answered to any of the above signs of chemical dependency indicates there are problems. The longer the use of drugs continues, the greater the problems will become. °If there is a family history of drug or alcohol use, it is best not to experiment with these drugs. Continual use leads to tolerance. After tolerance develops more of the drug is needed to get the same feeling. This is followed by addiction. With addiction, drugs become the most important part of life. It becomes more important to take drugs than participate in the other usual activities of life. This includes relating to friends and family. Addiction is followed by dependency. Dependency is a condition where drugs are now needed not just to get high, but to feel normal. °Addiction cannot be cured but it can be stopped. This often requires outside help   and the care of professionals. Treatment centers are listed in the yellow pages under: Cocaine, Narcotics, and Alcoholics Anonymous. Most hospitals and clinics can refer you to a specialized care center. Talk to your caregiver if you need help. °  °This information is not  intended to replace advice given to you by your health care provider. Make sure you discuss any questions you have with your health care provider. °  °Document Released: 10/20/2004 Document Revised: 05/23/2011 Document Reviewed: 03/05/2014 °Elsevier Interactive Patient Education ©2016 Elsevier Inc. ° °
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
## 2015-04-21 ENCOUNTER — Encounter (HOSPITAL_COMMUNITY): Payer: Self-pay

## 2015-04-21 ENCOUNTER — Emergency Department (HOSPITAL_COMMUNITY)
Admission: EM | Admit: 2015-04-21 | Discharge: 2015-04-21 | Disposition: A | Discharge status: Home / Self Care | Payer: Medicare Other | Attending: Emergency Medicine | Admitting: Emergency Medicine

## 2015-04-21 DIAGNOSIS — F22 Delusional disorders: Secondary | ICD-10-CM

## 2015-04-21 DIAGNOSIS — Z79899 Other long term (current) drug therapy: Secondary | ICD-10-CM | POA: Insufficient documentation

## 2015-04-21 DIAGNOSIS — F250 Schizoaffective disorder, bipolar type: Secondary | ICD-10-CM

## 2015-04-21 DIAGNOSIS — F17210 Nicotine dependence, cigarettes, uncomplicated: Secondary | ICD-10-CM | POA: Insufficient documentation

## 2015-04-21 DIAGNOSIS — R45851 Suicidal ideations: Secondary | ICD-10-CM | POA: Diagnosis present

## 2015-04-21 DIAGNOSIS — F209 Schizophrenia, unspecified: Secondary | ICD-10-CM | POA: Insufficient documentation

## 2015-04-21 DIAGNOSIS — R443 Hallucinations, unspecified: Secondary | ICD-10-CM

## 2015-04-21 DIAGNOSIS — R45850 Homicidal ideations: Secondary | ICD-10-CM | POA: Diagnosis not present

## 2015-04-21 DIAGNOSIS — E669 Obesity, unspecified: Secondary | ICD-10-CM | POA: Insufficient documentation

## 2015-04-21 DIAGNOSIS — Z8739 Personal history of other diseases of the musculoskeletal system and connective tissue: Secondary | ICD-10-CM | POA: Insufficient documentation

## 2015-04-21 DIAGNOSIS — F319 Bipolar disorder, unspecified: Secondary | ICD-10-CM | POA: Insufficient documentation

## 2015-04-21 LAB — VALPROIC ACID LEVEL: Valproic Acid Lvl: 66 ug/mL (ref 50.0–100.0)

## 2015-04-21 LAB — ACETAMINOPHEN LEVEL: Acetaminophen (Tylenol), Serum: <10 ug/mL — ABNORMAL LOW (ref 10–30)

## 2015-04-21 LAB — RAPID URINE DRUG SCREEN, HOSP PERFORMED
AMPHETAMINES: NOT DETECTED
BENZODIAZEPINES: NOT DETECTED
Barbiturates: NOT DETECTED
Cocaine: NOT DETECTED
OPIATES: NOT DETECTED
TETRAHYDROCANNABINOL: NOT DETECTED

## 2015-04-21 LAB — COMPREHENSIVE METABOLIC PANEL
ALBUMIN: 3.7 g/dL (ref 3.5–5.0)
ALK PHOS: 65 U/L (ref 38–126)
ALT: 46 U/L (ref 14–54)
ANION GAP: 9 (ref 5–15)
AST: 34 U/L (ref 15–41)
BILIRUBIN TOTAL: 0.3 mg/dL (ref 0.3–1.2)
BUN: 8 mg/dL (ref 6–20)
CO2: 27 mmol/L (ref 22–32)
CREATININE: 0.74 mg/dL (ref 0.44–1.00)
Calcium: 9.2 mg/dL (ref 8.9–10.3)
Chloride: 104 mmol/L (ref 101–111)
GFR calc Af Amer: >60 mL/min (ref >60)
GLUCOSE: 110 mg/dL — AB (ref 65–99)
Potassium: 4.2 mmol/L (ref 3.5–5.1)
SODIUM: 140 mmol/L (ref 135–145)
Total Protein: 7.2 g/dL (ref 6.5–8.1)

## 2015-04-21 LAB — CBC
HCT: 40.5 % (ref 36.0–46.0)
HEMOGLOBIN: 13.3 g/dL (ref 12.0–15.0)
MCH: 29.3 pg (ref 26.0–34.0)
MCHC: 32.8 g/dL (ref 30.0–36.0)
MCV: 89.2 fL (ref 78.0–100.0)
PLATELETS: 246 10*3/uL (ref 150–400)
RBC: 4.54 MIL/uL (ref 3.87–5.11)
RDW: 15.5 % (ref 11.5–15.5)
WBC: 6 10*3/uL (ref 4.0–10.5)

## 2015-04-21 LAB — ETHANOL

## 2015-04-21 LAB — SALICYLATE LEVEL

## 2015-04-21 MED ORDER — RISPERIDONE 2 MG PO TABS
2.0000 mg | ORAL_TABLET | Freq: Two times a day (BID) | ORAL | Status: DC
  Administered 2015-04-21: 2 mg via ORAL
  Filled 2015-04-21: qty 1

## 2015-04-21 MED ORDER — AMANTADINE HCL 100 MG PO CAPS
100.0000 mg | ORAL_CAPSULE | Freq: Two times a day (BID) | ORAL | Status: DC
  Filled 2015-04-21 (×2): qty 1

## 2015-04-21 MED ORDER — QUETIAPINE FUMARATE 50 MG PO TABS: 50.0000 mg | ORAL_TABLET | Freq: Every day | ORAL | Status: DC

## 2015-04-21 MED ORDER — DIVALPROEX SODIUM ER 500 MG PO TB24
1000.0000 mg | ORAL_TABLET | Freq: Two times a day (BID) | ORAL | Status: DC
  Administered 2015-04-21: 1000 mg via ORAL
  Filled 2015-04-21: qty 2

## 2015-04-21 MED ORDER — HYDROXYZINE HCL 25 MG PO TABS
25.0000 mg | ORAL_TABLET | Freq: Two times a day (BID) | ORAL | Status: DC
  Administered 2015-04-21: 25 mg via ORAL
  Filled 2015-04-21: qty 1

## 2015-04-21 NOTE — ED Notes (Signed)
Pt refused blood draw,   Notified nurse. 

## 2015-04-21 NOTE — ED Notes (Signed)
[name] [phone]

## 2015-04-21 NOTE — ED Notes (Signed)
Pt discharged ambulatory with ACT team.  Pt was in no distress and all belongings were returned to pt.

## 2015-04-21 NOTE — Discharge Instructions (Signed)
For your ongoing behavioral health needs, you are advised to continue treatment through the [facility] ACT Team:       [facility]      [address]      [place], [place] [address]      [phone]

## 2015-04-21 NOTE — ED Provider Notes (Signed)
TIME SEEN: 6:10 AM  CHIEF COMPLAINT:  Homicidal thoughts, hallucinations  HPI: Pt is a 55 y.o.  Female with history of bipolar disorder, schizoaffective disorder, substance abuse who presents to the emergency department for psychiatric evaluation. States she is having thoughts of wanting to hurt anyone he "gets in my way". Reports that she is seeing things and hearing voices telling her to hurt other people. She denies SI or command hallucinations to hurt herself.  She states that she feels people are after her as well.Reports she has been drinking alcohol the day. No drug use. Reports compliance with her medications. No other medical complaints.  ROS: See HPI Constitutional: no fever  Eyes: no drainage  ENT: no runny nose   Cardiovascular:  no chest pain  Resp: no SOB  GI: no vomiting GU: no dysuria Integumentary: no rash  Allergy: no hives  Musculoskeletal: no leg swelling  Neurological: no slurred speech ROS otherwise negative  PAST MEDICAL HISTORY/PAST SURGICAL HISTORY:  Past Medical History  Diagnosis Date  . Arthritis   . Seizures (HCC)   . Psychiatric illness   . Bipolar 1 disorder (HCC)   . Schizo-affective schizophrenia (HCC)   . Obesity   . Substance abuse     MEDICATIONS:  Prior to Admission medications   Medication Sig Start Date End Date Taking? Authorizing Provider  divalproex (DEPAKOTE ER) 500 MG 24 hr tablet Take 1 tablet (500 mg total) by mouth 2 (two) times daily. Patient taking differently: Take 1,000 mg by mouth 2 (two) times daily.  04/02/13  Yes Zannie Cove, MD  risperiDONE microspheres (RISPERDAL CONSTA) 50 MG injection Inject 50 mg into the muscle every 14 (fourteen) days.   Yes Historical Provider, MD  amantadine (SYMMETREL) 100 MG capsule Take 100 mg by mouth 2 (two) times daily.    Historical Provider, MD  hydrOXYzine (VISTARIL) 25 MG capsule Take 25 mg by mouth 2 (two) times daily.    Historical Provider, MD  QUEtiapine (SEROQUEL) 50 MG tablet  Take 50 mg by mouth at bedtime.    Historical Provider, MD  risperiDONE (RISPERDAL) 2 MG tablet Take 1 tablet (2 mg total) by mouth 2 (two) times daily. Take 2 mg po  Bid daily 08/28/14   Earney Navy, NP    ALLERGIES:  No Known Allergies  SOCIAL HISTORY:  Social History  Substance Use Topics  . Smoking status: Current Every Day Smoker -- 1.00 packs/day    Types: Cigarettes  . Smokeless tobacco: Never Used  . Alcohol Use: 3.6 oz/week    6 Cans of beer per week    FAMILY HISTORY: Family History  Problem Relation Age of Onset  . Alzheimer's disease Mother   . Depression Mother     EXAM: BP 148/105 mmHg  Pulse 111  Temp(Src) 97.5 F (36.4 C) (Oral)  Resp 18  SpO2 100%  LMP 01/13/2012 CONSTITUTIONAL: Alert and oriented and responds appropriately to questions.  Chronically ill-appearing HEAD: Normocephalic EYES: Conjunctivae clear, PERRL ENT: normal nose; no rhinorrhea; moist mucous membranes; pharynx without lesions noted NECK: Supple, no meningismus, no LAD  CARD:  Regular and tachycardic; S1 and S2 appreciated; no murmurs, no clicks, no rubs, no gallops RESP: Normal chest excursion without splinting or tachypnea; breath sounds clear and equal bilaterally; no wheezes, no rhonchi, no rales, no hypoxia or respiratory distress, speaking full sentences ABD/GI: Normal bowel sounds; non-distended; soft, non-tender, no rebound, no guarding, no peritoneal signs BACK:  The back appears normal and is non-tender to  palpation, there is no CVA tenderness EXT: Normal ROM in all joints; non-tender to palpation; no edema; normal capillary refill; no cyanosis, no calf tenderness or swelling    SKIN: Normal color for age and race; warm NEURO: Moves all extremities equally, sensation to light touch intact diffusely, cranial nerves II through XII intact PSYCH:  Appears disheveled, rapid and pressured speech, tangential thought process. Endorses HI without the ability to contract for safety.  No SI. Endorses command hallucinations. Patient reports she feels people are after her.  MEDICAL DECISION MAKING:  Patient here with complaints of HI, command hallucinations.  She is paranoid.She agrees to be here voluntarily. States she has been taking her medications. Does have rapid, pressured speech with change until thought process and poor insight. Unable to contract for safety. Will obtain screening labs and urine. Will consult TTS for evaluation.  ED PROGRESS: 8:00 AM  Oncoming EDP to follow up on pt's labs and ensure medical clearance.  Awaiting psych eval.       Mary Maw [place], DO 04/21/15 [phone]

## 2015-04-21 NOTE — BH Assessment (Signed)
[facility] Assessment Progress Note  Per Thedore Mins, MD this pt does not require psychiatric hospitalization at this time.  Pt is to be discharged from [facility].  Pt receives ACT Team services, and at the request of Thedore Mins, MD this writer called them to make arrangements for pt to be picked up.  Call was placed to [name] at 10:44.  She reports that she will is willing to make arrangements for pt to be picked up, however, they do not have a current address for her.  I then found that pt's guardian, [name] [phone]), had called earlier today.  At 11:34 I called her to notify her of Dr Gloris Manchester decision and to ask her about pt's address.  She reports that pt now lives at [address] [place], [place] [address].  At 11:37 I called back to [facility] with the new address.  Discharge instructions advise pt to continue treatment with [facility].  Pt's nurse, [name], has been notified.  I have also called [facility] to updated the registration record.  Mary Canning, MA Triage Specialist [phone]

## 2015-04-21 NOTE — ED Notes (Signed)
Pt sts the last time her blood was drawn the phlebotomist messed up her veins. The pt is refusing to let anyone stick her veins.

## 2015-04-21 NOTE — ED Notes (Signed)
Pt oriented to room and unit.  Pt asked to shower and shower items given.  Pt is pleasant and cooperative.  States she is hearing voices.  Pt was reassured of her safety.  She denied SI and HI.  15 minute checks and video monitoring continue.

## 2015-04-21 NOTE — ED Notes (Signed)
Pt here because she's having suicidal thoughts and hearing voices
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
## 2015-04-21 NOTE — [facility] Suicide Risk Assessment (Cosign Needed)
Suicide Risk Assessment  Discharge Assessment   [facility] Discharge Suicide Risk Assessment   Principal Problem: Schizoaffective disorder, bipolar type [facility]) Discharge Diagnoses:  Patient Active Problem List   Diagnosis Date Noted  . Schizoaffective disorder, bipolar type (HCC) [F25.0]     Priority: High  . Alcohol dependence (HCC) [F10.20] 06/28/2011    Priority: High  . Hallucination [R44.3]   . Homicidal ideation [R45.850]   . Schizophrenia, unspecified type (HCC) [F20.9]   . Suicidal ideations [R45.851]   . Schizoaffective disorder (HCC) [F25.9] 03/29/2013  . Pneumonia [J18.9] 03/29/2013  . Cocaine abuse [F14.10] 06/30/2011  . Non-compliant patient [Z91.19] 06/29/2011  . Hypertension [I10] 06/29/2011  . Polysubstance dependence (HCC) [F19.20] 04/03/2011  . Polysubstance abuse [F19.10] 06/21/2010    Total Time spent with patient: 20 minutes  Musculoskeletal: Strength & Muscle Tone: within normal limits Gait & Station: normal Patient leans: N/A  Psychiatric Specialty Exam:   Blood pressure 180/109, pulse 104, temperature 97.9 F (36.6 C), temperature source Oral, resp. rate 18, last menstrual period 01/13/2012, SpO2 98 %.There is no weight on file to calculate BMI.  General Appearance: Casual and Fairly Groomed  Patent attorney:: Good  Speech: Clear and Coherent and Pressured  Volume: Normal  Mood: Euthymic  Affect: Congruent  Thought Process: Coherent, Goal Directed and Intact  Orientation: Full (Time, Place, and Person)  Thought Content: Hallucinations: Auditory Visual  Suicidal Thoughts: No  Homicidal Thoughts: No  Memory: Immediate; Good Recent; Good Remote; Good  Judgement: Fair  Insight: Good  Psychomotor Activity: Normal  Concentration: Good  Recall: Fair  Fund of Knowledge:Fair  Language: Good  Akathisia: No  Handed: Right  AIMS (if indicated):    Assets: Desire for Improvement Housing  ADL's: Intact   Cognition: WNL         Mental Status Per Nursing Assessment::   On Admission:     Demographic Factors:  Low socioeconomic status, Living alone and Unemployed  Loss Factors: NA  Historical Factors: Prior suicide attempts  Risk Reduction Factors:   Living with another person, especially a relative and Positive social support  Continued Clinical Symptoms:  Bipolar Disorder:   Depressive phase More than one psychiatric diagnosis Previous Psychiatric Diagnoses and Treatments  Cognitive Features That Contribute To Risk:  Polarized thinking    Suicide Risk:  Minimal: No identifiable suicidal ideation.  Patients presenting with no risk factors but with morbid ruminations; may be classified as minimal risk based on the severity of the depressive symptoms    Plan Of Care/Follow-up recommendations:  Activity:  as tolerated Diet:  regular  Earney Navy, NP    PMHNP-BC 04/21/2015, 12:58 PM

## 2015-04-21 NOTE — Consult Note (Signed)
[facility] Face-to-Face Psychiatry Consult   Reason for Consult:  Homicidal ideation, auditory/visual hallucination Referring Physician:  EDP Patient Identification: [name] MRN:  [id number] Principal Diagnosis: Schizoaffective disorder, bipolar type [facility]) Diagnosis:   Patient Active Problem List   Diagnosis Date Noted  . Schizoaffective disorder, bipolar type ([place]) [F25.0]     Priority: High  . Alcohol dependence ([place]) [F10.20] 06/28/2011    Priority: High  . Schizophrenia, unspecified type ([place]) [F20.9]   . Suicidal ideations [R45.851]   . Schizoaffective disorder ([place]) [F25.9] 03/29/2013  . Pneumonia [J18.9] 03/29/2013  . Cocaine abuse [F14.10] 06/30/2011  . Non-compliant patient [Z91.19] 06/29/2011  . Hypertension [I10] 06/29/2011  . Polysubstance dependence ([place]) [F19.20] 04/03/2011  . Polysubstance abuse [F19.10] 06/21/2010    Total Time spent with patient: 45 minutes  Subjective:   [name] is a 55 y.o. female patient admitted with Homicidal ideation, auditory/visual hallucination .  HPI:  AA female, 55 years old was evaluated today after she voluntarily came to the ER seeking help for homicidal ideation, auditory and visual hallucination and Alcohol abuse.  There is no alcohol in her system on arrival.  Patient today denies SI/HI/AVH.  She admits hearing voices and seeing "everything and hearing everything"   Patient reports that she came to the hospital after an argument with her Landlord who she paid $500 dollars and is willing to get rid of her.  She also stated that she has been drinking a lot of Alcohol but was not able to quatify how much she drinks.  She had her Risperdal injection 2 weeks ago according to her.  Patient vehemently denied SI/HI/AVH.  She has been discharged home.  Past Psychiatric History:   Alcohol dependence, Polysubstance abuse, Schizoaffective disorder,   Risk to Self: Suicidal Ideation: No Suicidal Intent: No Is patient at risk for suicide?:  No Suicidal Plan?: No Access to Means: No What has been your use of drugs/alcohol within the last 12 months?:  (crack and alcohol ) How many times?:  (0) Other Self Harm Risks:  (Denies ) Triggers for Past Attempts: None known Intentional Self Injurious Behavior: None Risk to Others: Homicidal Ideation: No Thoughts of Harm to Others: No Current Homicidal Intent: No Current Homicidal Plan: No Access to Homicidal Means: No Identified Victim:  (Denies ) History of harm to others?: No Assessment of Violence: None Noted Violent Behavior Description:  (Denies ) Does patient have access to weapons?: No Criminal Charges Pending?: No Does patient have a court date: No Prior Inpatient Therapy: Prior Inpatient Therapy: Yes Prior Therapy Dates: last weekend ("they didn't keep me") Prior Therapy Facilty/Provider(s): [facility] Reason for Treatment: Bipolar Prior Outpatient Therapy: Prior Outpatient Therapy: Yes Prior Therapy Dates: 10 years Prior Therapy Facilty/Provider(s): [facility] Reason for Treatment: Bipolar Does patient have an ACCT team?: Yes Does patient have Intensive In-House Services?  : No Does patient have [facility] services? : No Does patient have P4CC services?: No  Past Medical History:  Past Medical History  Diagnosis Date  . Arthritis   . Seizures ([place])   . Psychiatric illness   . Bipolar 1 disorder ([place])   . Schizo-affective schizophrenia ([place])   . Obesity   . Substance abuse     Past Surgical History  Procedure Laterality Date  . Stomach surgery     Family History:  Family History  Problem Relation Age of Onset  . Alzheimer's disease Mother   . Depression Mother    Family Psychiatric  History:   Unknown,  patient states she does not know. Social History:  History  Alcohol Use  . 3.6 oz/week  . 6 Cans of beer per week     History  Drug Use  . 5.00 per week  . Special: "Crack" cocaine, Cocaine    Comment: crack/cocaine    Social History    Social History  . Marital Status: Single    Spouse Name: N/A  . Number of Children: N/A  . Years of Education: N/A   Social History Main Topics  . Smoking status: Current Every Day Smoker -- 1.00 packs/day    Types: Cigarettes  . Smokeless tobacco: Never Used  . Alcohol Use: 3.6 oz/week    6 Cans of beer per week  . Drug Use: 5.00 per week    Special: "Crack" cocaine, Cocaine     Comment: crack/cocaine  . Sexual Activity: No   Other Topics Concern  . None   Social History Narrative   Additional Social History:    Allergies:  No Known Allergies  Labs:  Results for orders placed or performed during the hospital encounter of 04/21/15 (from the past 48 hour(s))  Urine rapid drug screen (hosp performed) (Not at [facility])     Status: None   Collection Time: 04/21/15  5:46 AM  Result Value Ref Range   Opiates NONE DETECTED NONE DETECTED   Cocaine NONE DETECTED NONE DETECTED   Benzodiazepines NONE DETECTED NONE DETECTED   Amphetamines NONE DETECTED NONE DETECTED   Tetrahydrocannabinol NONE DETECTED NONE DETECTED   Barbiturates NONE DETECTED NONE DETECTED    Comment:        DRUG SCREEN FOR MEDICAL PURPOSES ONLY.  IF CONFIRMATION IS NEEDED FOR ANY PURPOSE, NOTIFY LAB WITHIN 5 DAYS.        LOWEST DETECTABLE LIMITS FOR URINE DRUG SCREEN Drug Class       Cutoff (ng/mL) Amphetamine      1000 Barbiturate      200 Benzodiazepine   846 Tricyclics       659 Opiates          300 Cocaine          300 THC              50   Comprehensive metabolic panel     Status: Abnormal   Collection Time: 04/21/15  7:02 AM  Result Value Ref Range   Sodium 140 135 - 145 mmol/L   Potassium 4.2 3.5 - 5.1 mmol/L   Chloride 104 101 - 111 mmol/L   CO2 27 22 - 32 mmol/L   Glucose, Bld 110 (H) 65 - 99 mg/dL   BUN 8 6 - 20 mg/dL   Creatinine, Ser 0.74 0.44 - 1.00 mg/dL   Calcium 9.2 8.9 - 10.3 mg/dL   Total Protein 7.2 6.5 - 8.1 g/dL   Albumin 3.7 3.5 - 5.0 g/dL   AST 34 15 - 41 U/L   ALT 46  14 - 54 U/L   Alkaline Phosphatase 65 38 - 126 U/L   Total Bilirubin 0.3 0.3 - 1.2 mg/dL   GFR calc non Af Amer >60 >60 mL/min   GFR calc Af Amer >60 >60 mL/min    Comment: (NOTE) The eGFR has been calculated using the CKD EPI equation. This calculation has not been validated in all clinical situations. eGFR's persistently <60 mL/min signify possible Chronic Kidney Disease.    Anion gap 9 5 - 15  Ethanol (ETOH)     Status: None  Collection Time: 04/21/15  7:02 AM  Result Value Ref Range   Alcohol, Ethyl (B) <5 <5 mg/dL    Comment:        LOWEST DETECTABLE LIMIT FOR SERUM ALCOHOL IS 5 mg/dL FOR MEDICAL PURPOSES ONLY   Salicylate level     Status: None   Collection Time: 04/21/15  7:02 AM  Result Value Ref Range   Salicylate Lvl <9.9 2.8 - 30.0 mg/dL  Acetaminophen level     Status: Abnormal   Collection Time: 04/21/15  7:02 AM  Result Value Ref Range   Acetaminophen (Tylenol), Serum <10 (L) 10 - 30 ug/mL    Comment:        THERAPEUTIC CONCENTRATIONS VARY SIGNIFICANTLY. A RANGE OF 10-30 ug/mL MAY BE AN EFFECTIVE CONCENTRATION FOR MANY PATIENTS. HOWEVER, SOME ARE BEST TREATED AT CONCENTRATIONS OUTSIDE THIS RANGE. ACETAMINOPHEN CONCENTRATIONS >150 ug/mL AT 4 HOURS AFTER INGESTION AND >50 ug/mL AT 12 HOURS AFTER INGESTION ARE OFTEN ASSOCIATED WITH TOXIC REACTIONS.   CBC     Status: None   Collection Time: 04/21/15  7:02 AM  Result Value Ref Range   WBC 6.0 4.0 - 10.5 K/uL   RBC 4.54 3.87 - 5.11 MIL/uL   Hemoglobin 13.3 12.0 - 15.0 g/dL   HCT 40.5 36.0 - 46.0 %   MCV 89.2 78.0 - 100.0 fL   MCH 29.3 26.0 - 34.0 pg   MCHC 32.8 30.0 - 36.0 g/dL   RDW 15.5 11.5 - 15.5 %   Platelets 246 150 - 400 K/uL  Valproic acid level     Status: None   Collection Time: 04/21/15  7:02 AM  Result Value Ref Range   Valproic Acid Lvl 66 50.0 - 100.0 ug/mL    Current Facility-Administered Medications  Medication Dose Route Frequency Provider Last Rate Last Dose  . amantadine  (SYMMETREL) capsule 100 mg  100 mg Oral BID Kristen N Ward, DO      . divalproex (DEPAKOTE ER) 24 hr tablet 1,000 mg  1,000 mg Oral BID Kristen N Ward, DO   1,000 mg at 04/21/15 1039  . hydrOXYzine (ATARAX/VISTARIL) tablet 25 mg  25 mg Oral BID Kristen N Ward, DO   25 mg at 04/21/15 1038  . QUEtiapine (SEROQUEL) tablet 50 mg  50 mg Oral QHS Kristen N Ward, DO      . risperiDONE (RISPERDAL) tablet 2 mg  2 mg Oral BID Kristen N Ward, DO   2 mg at 04/21/15 1039   Current Outpatient Prescriptions  Medication Sig Dispense Refill  . divalproex (DEPAKOTE ER) 500 MG 24 hr tablet Take 1 tablet (500 mg total) by mouth 2 (two) times daily. (Patient taking differently: Take 1,000 mg by mouth 2 (two) times daily. )    . risperiDONE microspheres (RISPERDAL CONSTA) 50 MG injection Inject 50 mg into the muscle every 14 (fourteen) days.    Marland Kitchen amantadine (SYMMETREL) 100 MG capsule Take 100 mg by mouth 2 (two) times daily.    . hydrOXYzine (VISTARIL) 25 MG capsule Take 25 mg by mouth 2 (two) times daily.    . QUEtiapine (SEROQUEL) 50 MG tablet Take 50 mg by mouth at bedtime.    . risperiDONE (RISPERDAL) 2 MG tablet Take 1 tablet (2 mg total) by mouth 2 (two) times daily. Take 2 mg po  Bid daily 60 tablet 0    Musculoskeletal: Strength & Muscle Tone: within normal limits Gait & Station: normal Patient leans: N/A  Psychiatric Specialty Exam: Review of Systems  Constitutional: Negative.   HENT: Negative.   Eyes: Negative.   Respiratory: Negative.   Cardiovascular: Negative.   Gastrointestinal: Negative.   Genitourinary: Negative.   Musculoskeletal: Negative.   Skin: Negative.   Neurological: Negative.   Endo/Heme/Allergies: Negative.     Blood pressure 180/109, pulse 104, temperature 97.9 F (36.6 C), temperature source Oral, resp. rate 18, last menstrual period 01/13/2012, SpO2 98 %.There is no weight on file to calculate BMI.  General Appearance: Casual and Fairly Groomed  Engineer, water::  Good   Speech:  Clear and Coherent and Pressured  Volume:  Normal  Mood:  Euthymic  Affect:  Congruent  Thought Process:  Coherent, Goal Directed and Intact  Orientation:  Full (Time, Place, and Person)  Thought Content:  Hallucinations: Auditory Visual  Suicidal Thoughts:  No  Homicidal Thoughts:  No  Memory:  Immediate;   Good Recent;   Good Remote;   Good  Judgement:  Fair  Insight:  Good  Psychomotor Activity:  Normal  Concentration:  Good  Recall:  [place]  Language: Good  Akathisia:  No  Handed:  Right  AIMS (if indicated):     Assets:  Desire for Improvement Housing  ADL's:  Intact  Cognition: WNL  Sleep:       Disposition:  Discharge home, follow up with your ACT team Providers.  Delfin Gant, NP    PMHNP-BC 04/21/2015 12:30 PM Patient seen face-to-face for psychiatric evaluation, chart reviewed and case discussed with the physician extender and developed treatment plan. Reviewed the information documented and agree with the treatment plan. Corena Pilgrim, MD

## 2015-05-05 ENCOUNTER — Emergency Department (HOSPITAL_COMMUNITY)
Admission: EM | Admit: 2015-05-05 | Discharge: 2015-05-05 | Disposition: A | Discharge status: Home / Self Care | Payer: Medicare Other | Attending: Emergency Medicine | Admitting: Emergency Medicine

## 2015-05-05 ENCOUNTER — Encounter (HOSPITAL_COMMUNITY): Payer: Self-pay | Admitting: Emergency Medicine

## 2015-05-05 ENCOUNTER — Emergency Department (HOSPITAL_COMMUNITY): Payer: Medicare Other

## 2015-05-05 DIAGNOSIS — M1990 Unspecified osteoarthritis, unspecified site: Secondary | ICD-10-CM | POA: Diagnosis not present

## 2015-05-05 DIAGNOSIS — R69 Illness, unspecified: Secondary | ICD-10-CM

## 2015-05-05 DIAGNOSIS — F17210 Nicotine dependence, cigarettes, uncomplicated: Secondary | ICD-10-CM | POA: Diagnosis not present

## 2015-05-05 DIAGNOSIS — J111 Influenza due to unidentified influenza virus with other respiratory manifestations: Secondary | ICD-10-CM | POA: Diagnosis not present

## 2015-05-05 DIAGNOSIS — F319 Bipolar disorder, unspecified: Secondary | ICD-10-CM | POA: Insufficient documentation

## 2015-05-05 DIAGNOSIS — Z79899 Other long term (current) drug therapy: Secondary | ICD-10-CM | POA: Diagnosis not present

## 2015-05-05 DIAGNOSIS — R000 Tachycardia, unspecified: Secondary | ICD-10-CM | POA: Diagnosis not present

## 2015-05-05 DIAGNOSIS — E669 Obesity, unspecified: Secondary | ICD-10-CM | POA: Diagnosis not present

## 2015-05-05 DIAGNOSIS — R509 Fever, unspecified: Secondary | ICD-10-CM | POA: Diagnosis present

## 2015-05-05 DIAGNOSIS — F250 Schizoaffective disorder, bipolar type: Secondary | ICD-10-CM | POA: Insufficient documentation

## 2015-05-05 LAB — COMPREHENSIVE METABOLIC PANEL
ALK PHOS: 60 U/L (ref 38–126)
ALT: 21 U/L (ref 14–54)
AST: 21 U/L (ref 15–41)
Albumin: 3.8 g/dL (ref 3.5–5.0)
Anion gap: 10 (ref 5–15)
BILIRUBIN TOTAL: 0.4 mg/dL (ref 0.3–1.2)
BUN: 11 mg/dL (ref 6–20)
CALCIUM: 8.7 mg/dL — AB (ref 8.9–10.3)
CHLORIDE: 102 mmol/L (ref 101–111)
CO2: 23 mmol/L (ref 22–32)
CREATININE: 0.66 mg/dL (ref 0.44–1.00)
GFR calc Af Amer: >60 mL/min (ref >60)
Glucose, Bld: 107 mg/dL — ABNORMAL HIGH (ref 65–99)
Potassium: 3.9 mmol/L (ref 3.5–5.1)
Sodium: 135 mmol/L (ref 135–145)
TOTAL PROTEIN: 7.5 g/dL (ref 6.5–8.1)

## 2015-05-05 LAB — CBC
HCT: 37.5 % (ref 36.0–46.0)
Hemoglobin: 12.5 g/dL (ref 12.0–15.0)
MCH: 29.2 pg (ref 26.0–34.0)
MCHC: 33.3 g/dL (ref 30.0–36.0)
MCV: 87.6 fL (ref 78.0–100.0)
PLATELETS: 198 10*3/uL (ref 150–400)
RBC: 4.28 MIL/uL (ref 3.87–5.11)
RDW: 15.4 % (ref 11.5–15.5)
WBC: 4.5 10*3/uL (ref 4.0–10.5)

## 2015-05-05 LAB — URINE MICROSCOPIC-ADD ON

## 2015-05-05 LAB — I-STAT CG4 LACTIC ACID, ED: Lactic Acid, Venous: 1.15 mmol/L (ref 0.5–2.0)

## 2015-05-05 LAB — URINALYSIS, ROUTINE W REFLEX MICROSCOPIC
Bilirubin Urine: NEGATIVE
GLUCOSE, UA: NEGATIVE mg/dL
KETONES UR: 15 mg/dL — AB
LEUKOCYTES UA: NEGATIVE
Nitrite: NEGATIVE
PROTEIN: NEGATIVE mg/dL
Specific Gravity, Urine: 1.024 (ref 1.005–1.030)
pH: 5.5 (ref 5.0–8.0)

## 2015-05-05 LAB — I-STAT TROPONIN, ED: Troponin i, poc: 0.01 ng/mL (ref 0.00–0.08)

## 2015-05-05 LAB — CBG MONITORING, ED: GLUCOSE-CAPILLARY: 104 mg/dL — AB (ref 65–99)

## 2015-05-05 MED ORDER — SODIUM CHLORIDE 0.9 % IV BOLUS (SEPSIS): 2000.0000 mL | Freq: Once | INTRAVENOUS | Status: DC

## 2015-05-05 MED ORDER — ACETAMINOPHEN 325 MG PO TABS
650.0000 mg | ORAL_TABLET | Freq: Once | ORAL | Status: AC | PRN
  Administered 2015-05-05: 650 mg via ORAL
  Filled 2015-05-05: qty 2

## 2015-05-05 MED ORDER — ACETAMINOPHEN 500 MG PO TABS: 1000.0000 mg | ORAL_TABLET | Freq: Once | ORAL | Status: DC

## 2015-05-05 NOTE — ED Notes (Signed)
EMS states she took two pills, said she wouldn't tell them what pills they were. The only thing she would say is "the doctor told me to take them." No medication bottle found on patient.

## 2015-05-05 NOTE — ED Notes (Signed)
Pt is sitting up eating, no distress.  Pt refused IV as well as blood draw.  Ate and tolerated dinner.  Await further orders.

## 2015-05-05 NOTE — ED Notes (Signed)
Per EMS, from home, called out for stroke and headache, stroke screen negative, they state the slurred speech is at patient's baseline.

## 2015-05-05 NOTE — ED Notes (Signed)
Pt asked for bus pass and once she got it she ambulated out of the ED, no distress noted

## 2015-05-05 NOTE — ED Provider Notes (Signed)
CSN: [id number]     Arrival date & time 05/05/15  1420 History   First MD Initiated Contact with Patient 05/05/15 1512     Chief Complaint  Patient presents with  . Chest Pain  . Fever  . Headache   Chief complaint "I think I have the flu."  (Consider location/radiation/quality/duration/timing/severity/associated sxs/prior Treatment) HPI Complains of diffuse body aches, nonproductive cough, sore throat, headache, subjective fever onset 2 days ago. No nausea or vomiting. No treatment prior to coming here. No other associated symptoms nothing makes symptoms better or worse. Past Medical History  Diagnosis Date  . Arthritis   . Seizures (HCC)   . Psychiatric illness   . Bipolar 1 disorder (HCC)   . Schizo-affective schizophrenia (HCC)   . Obesity   . Substance abuse    Past Surgical History  Procedure Laterality Date  . Stomach surgery     Family History  Problem Relation Age of Onset  . Alzheimer's disease Mother   . Depression Mother    Social History  Substance Use Topics  . Smoking status: Current Every Day Smoker -- 1.00 packs/day    Types: Cigarettes  . Smokeless tobacco: Never Used  . Alcohol Use: 3.6 oz/week    6 Cans of beer per week   OB History    No data available     Review of Systems  Constitutional: Positive for fever.  Respiratory: Positive for cough.   Musculoskeletal: Positive for myalgias.  Neurological: Positive for headaches.  Psychiatric/Behavioral:       Hears voices chronically. No suicidal or homicidal commands. Today she reports voices are telling her to "go to church."  All other systems reviewed and are negative.     Allergies  Review of patient's allergies indicates no known allergies.  Home Medications   Prior to Admission medications   Medication Sig Start Date End Date Taking? Authorizing Provider  amantadine (SYMMETREL) 100 MG capsule Take 100 mg by mouth 2 (two) times daily.    Historical Provider, MD  divalproex  (DEPAKOTE ER) 500 MG 24 hr tablet Take 1 tablet (500 mg total) by mouth 2 (two) times daily. Patient taking differently: Take 1,000 mg by mouth 2 (two) times daily.  04/02/13   Zannie Cove, MD  hydrOXYzine (VISTARIL) 25 MG capsule Take 25 mg by mouth 2 (two) times daily.    Historical Provider, MD  QUEtiapine (SEROQUEL) 50 MG tablet Take 50 mg by mouth at bedtime.    Historical Provider, MD  risperiDONE (RISPERDAL) 2 MG tablet Take 1 tablet (2 mg total) by mouth 2 (two) times daily. Take 2 mg po  Bid daily 08/28/14   Earney Navy, NP  risperiDONE microspheres (RISPERDAL CONSTA) 50 MG injection Inject 50 mg into the muscle every 14 (fourteen) days.    Historical Provider, MD   BP 171/118 mmHg  Pulse 130  Temp(Src) 103.1 F (39.5 C) (Oral)  Resp 24  SpO2 92%  LMP 01/13/2012 Physical Exam  Constitutional: She appears well-developed and well-nourished. No distress.  HENT:  Head: Normocephalic and atraumatic.  Edentulous  Eyes: Conjunctivae are normal. Pupils are equal, round, and reactive to light.  Neck: Neck supple. No tracheal deviation present. No thyromegaly present.  Cardiovascular: Regular rhythm.   No murmur heard. Tachycardic  Pulmonary/Chest: Effort normal and breath sounds normal.  Abdominal: Soft. Bowel sounds are normal. She exhibits no distension. There is no tenderness.  Obese  Musculoskeletal: Normal range of motion. She exhibits no edema or tenderness.  Neurological: She is alert. Coordination normal.  Skin: Skin is warm and dry. No rash noted.  Nursing note and vitals reviewed.   ED Course  Procedures (including critical care time) Labs Review Labs Reviewed  CBG MONITORING, ED - Abnormal; Notable for the following:    Glucose-Capillary 104 (*)    All other components within normal limits  COMPREHENSIVE METABOLIC PANEL  CBC  URINALYSIS, ROUTINE W REFLEX MICROSCOPIC (NOT AT [facility])  I-STAT CG4 LACTIC ACID, ED  I-STAT TROPOININ, ED    Imaging Review No  results found. I have personally reviewed and evaluated these images and lab results as part of my medical decision-making.   EKG Interpretation   Date/Time:  Tuesday May 05 2015 15:01:24 EST Ventricular Rate:  132 PR Interval:  112 QRS Duration: 64 QT Interval:  264 QTC Calculation: 391 R Axis:   59 Text Interpretation:  Sinus tachycardia Ventricular trigeminy Consider  right atrial enlargement Probable LVH with secondary repol abnrm SINCE  LAST TRACING HEART RATE HAS INCREASED Confirmed by Ethelda Chick  MD, Paloma Grange  [phone]) on 05/05/2015 3:40:23 PM     4:55 PM patient reports feeling lightheaded  730 p.m. she feels much improved and ready go home after eating a meal and treatment with Tylenol.  IV fluids were attempted however patient refused after the nursing attempted several needlesticks. She is not lightheaded on discharge Results for orders placed or performed during the hospital encounter of 05/05/15  Comprehensive metabolic panel  Result Value Ref Range   Sodium 135 135 - 145 mmol/L   Potassium 3.9 3.5 - 5.1 mmol/L   Chloride 102 101 - 111 mmol/L   CO2 23 22 - 32 mmol/L   Glucose, Bld 107 (H) 65 - 99 mg/dL   BUN 11 6 - 20 mg/dL   Creatinine, Ser 6.04 0.44 - 1.00 mg/dL   Calcium 8.7 (L) 8.9 - 10.3 mg/dL   Total Protein 7.5 6.5 - 8.1 g/dL   Albumin 3.8 3.5 - 5.0 g/dL   AST 21 15 - 41 U/L   ALT 21 14 - 54 U/L   Alkaline Phosphatase 60 38 - 126 U/L   Total Bilirubin 0.4 0.3 - 1.2 mg/dL   GFR calc non Af Amer >60 >60 mL/min   GFR calc Af Amer >60 >60 mL/min   Anion gap 10 5 - 15  CBC  Result Value Ref Range   WBC 4.5 4.0 - 10.5 K/uL   RBC 4.28 3.87 - 5.11 MIL/uL   Hemoglobin 12.5 12.0 - 15.0 g/dL   HCT 54.0 98.1 - 19.1 %   MCV 87.6 78.0 - 100.0 fL   MCH 29.2 26.0 - 34.0 pg   MCHC 33.3 30.0 - 36.0 g/dL   RDW 47.8 29.5 - 62.1 %   Platelets 198 150 - 400 K/uL  Urinalysis, Routine w reflex microscopic (not at [facility])  Result Value Ref Range   Color, Urine YELLOW  YELLOW   APPearance CLOUDY (A) CLEAR   Specific Gravity, Urine 1.024 1.005 - 1.030   pH 5.5 5.0 - 8.0   Glucose, UA NEGATIVE NEGATIVE mg/dL   Hgb urine dipstick TRACE (A) NEGATIVE   Bilirubin Urine NEGATIVE NEGATIVE   Ketones, ur 15 (A) NEGATIVE mg/dL   Protein, ur NEGATIVE NEGATIVE mg/dL   Nitrite NEGATIVE NEGATIVE   Leukocytes, UA NEGATIVE NEGATIVE  Urine microscopic-add on  Result Value Ref Range   Squamous Epithelial / LPF 6-30 (A) NONE SEEN   WBC, UA 0-5 0 - 5 WBC/hpf  RBC / HPF 0-5 0 - 5 RBC/hpf   Bacteria, UA RARE (A) NONE SEEN   Urine-Other MUCOUS PRESENT   I-Stat CG4 Lactic Acid, ED  (not at [facility])  Result Value Ref Range   Lactic Acid, Venous 1.15 0.5 - 2.0 mmol/L  I-stat troponin, ED (not at [facility])  Result Value Ref Range   Troponin i, poc 0.01 0.00 - 0.08 ng/mL   Comment 3          CBG monitoring, ED  Result Value Ref Range   Glucose-Capillary 104 (H) 65 - 99 mg/dL   Dg Chest 2 View  7/82/9562  CLINICAL DATA:  55 year old female with acute onset generalized chest pain. Hypertensive. Initial encounter. EXAM: CHEST  2 VIEW COMPARISON:  04/12/2015 and earlier. FINDINGS: Lower lung volumes. Cardiac size appears stable at the upper limits of normal. Other mediastinal contours are within normal limits. Visualized tracheal air column is within normal limits. Increased basilar predominant interstitial markings with no pleural effusion, pneumothorax, or confluent opacity. No acute osseous abnormality identified. IMPRESSION: Mildly lower lung volumes. Increased basilar predominant interstitial markings could might reflect atelectasis or developing interstitial edema in this setting. No pleural fluid. Electronically Signed   By: Odessa Fleming M.D.   On: 05/05/2015 15:52   Dg Chest 2 View  04/12/2015  CLINICAL DATA:  Chest palpitations beginning this morning. EXAM: CHEST  2 VIEW COMPARISON:  04/18/2014 FINDINGS: Heart size is normal. Mediastinal shadows are normal. The lungs are  clear. No effusions. The vascularity is normal. IMPRESSION: Normal chest Electronically Signed   By: Paulina Fusi M.D.   On: 04/12/2015 11:14   Chest xray viewed by me MDM  Stable for discharge to home. She did not get flu shot this year. She left without waiting for instructions. Verbal instructions were given by me. She is told to follow-up with her private physician if not feeling better in a week or Tylenol for discomfort or fever Diagnosis influenza-like illness Final diagnoses:  None        Doug Sou, MD 05/05/15 2010

## 2015-05-05 NOTE — ED Notes (Signed)
Pt states she started having chest pain today, and she felt like she was very sick. C/o headache. HR 130, O2 sat 92% on RA, BP 171/118 in triage, temp 103.1, resp 24

## 2015-05-30 ENCOUNTER — Emergency Department (HOSPITAL_COMMUNITY)
Admission: EM | Admit: 2015-05-30 | Discharge: 2015-05-30 | Disposition: A | Discharge status: Home / Self Care | Payer: Medicare Other

## 2015-05-30 ENCOUNTER — Encounter (HOSPITAL_COMMUNITY): Payer: Self-pay | Admitting: Emergency Medicine

## 2015-05-30 ENCOUNTER — Emergency Department (HOSPITAL_COMMUNITY)
Admission: EM | Admit: 2015-05-30 | Discharge: 2015-05-30 | Disposition: A | Discharge status: Home / Self Care | Payer: Medicare Other | Attending: Emergency Medicine | Admitting: Emergency Medicine

## 2015-05-30 DIAGNOSIS — E669 Obesity, unspecified: Secondary | ICD-10-CM | POA: Insufficient documentation

## 2015-05-30 DIAGNOSIS — F10120 Alcohol abuse with intoxication, uncomplicated: Secondary | ICD-10-CM | POA: Diagnosis not present

## 2015-05-30 DIAGNOSIS — F17210 Nicotine dependence, cigarettes, uncomplicated: Secondary | ICD-10-CM | POA: Diagnosis not present

## 2015-05-30 DIAGNOSIS — F10920 Alcohol use, unspecified with intoxication, uncomplicated: Secondary | ICD-10-CM

## 2015-05-30 DIAGNOSIS — F209 Schizophrenia, unspecified: Secondary | ICD-10-CM | POA: Diagnosis not present

## 2015-05-30 DIAGNOSIS — F319 Bipolar disorder, unspecified: Secondary | ICD-10-CM | POA: Diagnosis not present

## 2015-05-30 DIAGNOSIS — Z79899 Other long term (current) drug therapy: Secondary | ICD-10-CM | POA: Diagnosis not present

## 2015-05-30 DIAGNOSIS — Z8739 Personal history of other diseases of the musculoskeletal system and connective tissue: Secondary | ICD-10-CM | POA: Insufficient documentation

## 2015-05-30 DIAGNOSIS — F10129 Alcohol abuse with intoxication, unspecified: Secondary | ICD-10-CM | POA: Diagnosis present

## 2015-05-30 NOTE — Discharge Instructions (Signed)
Alcohol Intoxication Ms. Cheree DittoGraham, there are resources below to help you with your alcohol addiction.  Please use them immediately.  If any symptoms worsen, come back to the ED for evaluation.  Thank you. Alcohol intoxication occurs when you drink enough alcohol that it affects your ability to function. It can be mild or very severe. Drinking a lot of alcohol in a short time is called binge drinking. This can be very harmful. Drinking alcohol can also be more dangerous if you are taking medicines or other drugs. Some of the effects caused by alcohol may include:  Loss of coordination.  Changes in mood and behavior.  Unclear thinking.  Trouble talking (slurred speech).  Throwing up (vomiting).  Confusion.  Slowed breathing.  Twitching and shaking (seizures).  Loss of consciousness. HOME CARE  Do not drive after drinking alcohol.  Drink enough water and fluids to keep your pee (urine) clear or pale yellow. Avoid caffeine.  Only take medicine as told by your doctor. GET HELP IF:  You throw up (vomit) many times.  You do not feel better after a few days.  You frequently have alcohol intoxication. Your doctor can help decide if you should see a substance use treatment counselor. GET HELP RIGHT AWAY IF:  You become shaky when you stop drinking.  You have twitching and shaking.  You throw up blood. It may look bright red or like coffee grounds.  You notice blood in your poop (bowel movements).  You become lightheaded or pass out (faint). MAKE SURE YOU:   Understand these instructions.  Will watch your condition.  Will get help right away if you are not doing well or get worse.   This information is not intended to replace advice given to you by your health care provider. Make sure you discuss any questions you have with your health care provider.   Document Released: 08/17/2007 Document Revised: 10/31/2012 Document Reviewed: 08/03/2012 Elsevier Interactive Patient  Education 2016 [employer] Substance Abuse Treatment Programs  Intensive Outpatient Programs [facility]     [address]      [place], [place]                   [phone]       [facility] [address] [place], [place] [phone]  [facility]     (Inpatient and outpatient)     [address]           [phone]    [facility] [phone] (Suboxone and Methadone)  [address]      [place], [place] [address]      [phone]       [address] [place], [place] [phone]  [facility] (Outpatient/Inpatient, Chemical)    (insurance only) [phone]             [facility] (Groups & Residential) [place], [place] [phone]     [facility]     [address]     [place], [place]      [phone]       [facility] (for caregivers and family) [phone] [address] [place], [place] [phone]      Residential Treatment Programs [facility]      [address], [place], [place] [address]  [phone]       [facility] [address] [place], [place] [address] [phone]  [facility]  [phone]       [facility] [phone]  [facility] ([facility]             [address]                                         [place], [place]                                                [phone] or [phone]                               [facility] [place] [address] [place], [address] [phone]  [facility]    [address]      [place], [place]     [phone]       [facility] [address] [place], [place] [phone]  [facility]   [address]     [place], [place] [address]     [phone]      Admissions: 8am-3pm M-F  [facility] (RTS) [address] [place], [place] [phone]  [facility] Program: Residential Program [phone](90 Days)   [place], [place]      [phone] or [phone]     [facility]: [facility] [place], [place] (Walk in Hours over the weekend or by referral)  [facility] [address], [place], [place] [address] [phone]  Crisis Mobile: [facility]:  [phone] (for crisis response 24 hours a day) [facility] Hotline:      [phone] Outpatient Psychiatry and Counseling  [facility]: Mobile Crisis Management 24 hours:  [phone]  [facility] sliding scale fee and walk in schedule: M-F 8am-12pm/1pm-3pm [address]  [place], [place] [address] [phone]  [facility] [address] [place], [place] [address] [phone]  [facility] ([facility] new patient walk-in appointments available Monday - Friday 8am -3pm.          [address] [place], [place] [address] [phone] or crisis line- [phone]  [facility]/ Intensive Outpatient Therapy Program [address] [place], [place] [address] [phone]  [facility]                  Crisis Services      [phone]      [address]     [place], [place] [address]                 [place] [facility]   [facility] [phone] [address] [place], [place] [address]   [facility]          [address] [place],  [place], [place] [address]       [phone]  [facility] [address] [place], [place] [address] [phone]  [facility]    [address]    [place], [place] [address]     [phone]       Andee PolesParish McKinney, MD  [place] [address]     [phone]       [facility] [place] [address]  [facility]     [address], [place], [place], [place] [address] [place]     [address]     [place], [place] [address]     [phone]       [facility] [address] [place], [place] [address] [phone] Resources for Temporary Residential Assistance/Crisis [facility]) M-F 8am-3pm   [address] [place], [place] [address]   [phone] Services include: laundry, barbering, support groups, case management, phone  & computer access, showers, AA/NA mtgs, mental health/substance abuse nurse, job skills class, disability information, VA assistance, spiritual classes, etc.   [facility]   [address], [place]     [place]              [employer] (women and children)       [place]. [place], [place] [phone] [email or web address] for application and process Application Required  [facility]   [address]    [place] [address]     [phone]                    [place], [place] [address] [phone]([email or web address] application appt.) Application Required  [facility] (women only)    [address]     [place], [place] [address]     [phone]      Intake starts 6pm daily Need valid ID, SSC, & Police report [employer] [address] [place], [place] [phone] Application Required  [employer] (men only)     [address].      [place], [place]       [place] (Pregnant women only) [address] [place],  [place]  [facility]      [place] [address]      [place], [place] [address]     [phone]             [facility] [address] [place], [place] 90 day commitment/SA/Application process  [facility](men only)     [address]     [place], [place]       Check-in at [facility] of [facility] [address] [place],  [address] [phone] Men/Women/Women and Children must be there by 7 pm  [place], [place]

## 2015-05-30 NOTE — ED Notes (Signed)
Pt checked back in, Dr [name] Bellmanni in to talk with pt with writer present, during conversation pt stated her ride is coming, walked out.

## 2015-05-30 NOTE — ED Provider Notes (Signed)
CSN: [id number]     Arrival date & time 05/30/15  1133 History   First MD Initiated Contact with Patient 05/30/15 1231     Chief Complaint  Patient presents with  . Alcohol Intoxication     (Consider location/radiation/quality/duration/timing/severity/associated sxs/prior Treatment) HPI   [name] is a 55 yo female PMH of bipolar, cocaine and alcohol abuse here with acute alcohol intoxication and requesting to go to behavioral health for detox.  She states she recently used cocain and alcohol last night.  She denies any SI or HI.  She denies any fever, recent illness, fevers, uri symptoms or abdominal symptoms.  There are no further complaints.  10 Systems reviewed and are negative for acute change except as noted in the HPI.     Past Medical History  Diagnosis Date  . Arthritis   . Seizures (HCC)   . Psychiatric illness   . Bipolar 1 disorder (HCC)   . Schizo-affective schizophrenia (HCC)   . Obesity   . Substance abuse    Past Surgical History  Procedure Laterality Date  . Stomach surgery     Family History  Problem Relation Age of Onset  . Alzheimer's disease Mother   . Depression Mother    Social History  Substance Use Topics  . Smoking status: Current Every Day Smoker -- 1.00 packs/day    Types: Cigarettes  . Smokeless tobacco: Never Used  . Alcohol Use: 3.6 oz/week    6 Cans of beer per week   OB History    No data available     Review of Systems    Allergies  Review of patient's allergies indicates no known allergies.  Home Medications   Prior to Admission medications   Medication Sig Start Date End Date Taking? Authorizing Provider  amantadine (SYMMETREL) 100 MG capsule Take 100 mg by mouth 2 (two) times daily.    Historical Provider, MD  divalproex (DEPAKOTE ER) 500 MG 24 hr tablet Take 1 tablet (500 mg total) by mouth 2 (two) times daily. Patient taking differently: Take 1,000 mg by mouth 2 (two) times daily.  04/02/13   Zannie Cove, MD   hydrOXYzine (VISTARIL) 25 MG capsule Take 25-50 mg by mouth at bedtime as needed. For insomnia    Historical Provider, MD  QUEtiapine (SEROQUEL) 50 MG tablet Take 50 mg by mouth at bedtime.    Historical Provider, MD  risperiDONE (RISPERDAL) 2 MG tablet Take 1 tablet (2 mg total) by mouth 2 (two) times daily. Take 2 mg po  Bid daily 08/28/14   Earney Navy, NP  risperiDONE microspheres (RISPERDAL CONSTA) 50 MG injection Inject 50 mg into the muscle every 14 (fourteen) days.    Historical Provider, MD   BP 109/75 mmHg  Pulse 114  Temp(Src) 98.6 F (37 C) (Oral)  Resp 18  SpO2 96%  LMP 01/13/2012 Physical Exam  Constitutional: She is oriented to person, place, and time. She appears well-developed and well-nourished. No distress.  Clinically intoxicated.  HENT:  Head: Normocephalic and atraumatic.  Nose: Nose normal.  Mouth/Throat: Oropharynx is clear and moist. No oropharyngeal exudate.  Eyes: Conjunctivae and EOM are normal. Pupils are equal, round, and reactive to light. No scleral icterus.  Neck: Normal range of motion. Neck supple. No JVD present. No tracheal deviation present. No thyromegaly present.  Cardiovascular: Normal rate, regular rhythm and normal heart sounds.  Exam reveals no gallop and no friction rub.   No murmur heard. No tachycardia on my exam  Pulmonary/Chest: Effort normal and breath sounds normal. No respiratory distress. She has no wheezes. She exhibits no tenderness.  Abdominal: Soft. Bowel sounds are normal. She exhibits no distension and no mass. There is no tenderness. There is no rebound and no guarding.  Musculoskeletal: Normal range of motion. She exhibits no edema or tenderness.  Lymphadenopathy:    She has no cervical adenopathy.  Neurological: She is alert and oriented to person, place, and time. No cranial nerve deficit. She exhibits normal muscle tone.  Skin: Skin is warm and dry. No rash noted. No erythema. No pallor.  Nursing note and vitals  reviewed.   ED Course  Procedures (including critical care time) Labs Review Labs Reviewed - No data to display  Imaging Review No results found. I have personally reviewed and evaluated these images and lab results as part of my medical decision-making.   EKG Interpretation None      MDM   Final diagnoses:  Alcohol intoxication, uncomplicated (HCC)   Patient presents to the ED for evaluation of alcohol intoxication.  She currently appears intoxicated but denies SI or HI.  She does not meet requirements for TTS consultation.  She was given behavioral health resources to help her.  She appears well and in NAD.  Tachycardia has resolved without any intervention.  VS remain within her normal limits and she is safe for DC.    Tomasita CrumbleAdeleke Maisy [place], MD 05/30/15 1323

## 2015-05-30 NOTE — ED Notes (Signed)
Bed: WLPT3 Expected date: 05/30/15 Expected time: 11:32 AM Means of arrival: Ambulance Comments: ETOH

## 2015-05-30 NOTE — ED Notes (Signed)
Patient states she wants something to eat, drink, and blankets.  She denies SI/HI.  [facility]

## 2015-05-30 NOTE — ED Notes (Signed)
Patient here with complaints of alcohol intoxication. States " I want to go to behavioral health". Denies SI/HI.

## 2015-08-09 ENCOUNTER — Emergency Department (HOSPITAL_COMMUNITY)
Admission: EM | Admit: 2015-08-09 | Discharge: 2015-08-10 | Disposition: A | Discharge status: Home / Self Care | Payer: Medicare Other | Attending: Emergency Medicine | Admitting: Emergency Medicine

## 2015-08-09 ENCOUNTER — Encounter (HOSPITAL_COMMUNITY): Payer: Self-pay | Admitting: Emergency Medicine

## 2015-08-09 DIAGNOSIS — F17210 Nicotine dependence, cigarettes, uncomplicated: Secondary | ICD-10-CM | POA: Diagnosis not present

## 2015-08-09 DIAGNOSIS — M1990 Unspecified osteoarthritis, unspecified site: Secondary | ICD-10-CM | POA: Diagnosis not present

## 2015-08-09 DIAGNOSIS — Z79899 Other long term (current) drug therapy: Secondary | ICD-10-CM | POA: Diagnosis not present

## 2015-08-09 DIAGNOSIS — R45850 Homicidal ideations: Secondary | ICD-10-CM | POA: Diagnosis not present

## 2015-08-09 DIAGNOSIS — F10129 Alcohol abuse with intoxication, unspecified: Secondary | ICD-10-CM | POA: Diagnosis present

## 2015-08-09 DIAGNOSIS — E669 Obesity, unspecified: Secondary | ICD-10-CM | POA: Diagnosis not present

## 2015-08-09 DIAGNOSIS — F250 Schizoaffective disorder, bipolar type: Secondary | ICD-10-CM | POA: Diagnosis present

## 2015-08-09 DIAGNOSIS — F1410 Cocaine abuse, uncomplicated: Secondary | ICD-10-CM | POA: Diagnosis present

## 2015-08-09 LAB — CBC WITH DIFFERENTIAL/PLATELET
BASOS ABS: 0 10*3/uL (ref 0.0–0.1)
Basophils Relative: 0 %
EOS ABS: 0.1 10*3/uL (ref 0.0–0.7)
EOS PCT: 1 %
HCT: 41.3 % (ref 36.0–46.0)
Hemoglobin: 13.3 g/dL (ref 12.0–15.0)
LYMPHS ABS: 1.7 10*3/uL (ref 0.7–4.0)
Lymphocytes Relative: 30 %
MCH: 28.9 pg (ref 26.0–34.0)
MCHC: 32.2 g/dL (ref 30.0–36.0)
MCV: 89.8 fL (ref 78.0–100.0)
Monocytes Absolute: 0.5 10*3/uL (ref 0.1–1.0)
Monocytes Relative: 9 %
Neutro Abs: 3.4 10*3/uL (ref 1.7–7.7)
Neutrophils Relative %: 60 %
PLATELETS: 284 10*3/uL (ref 150–400)
RBC: 4.6 MIL/uL (ref 3.87–5.11)
RDW: 15.5 % (ref 11.5–15.5)
WBC: 5.7 10*3/uL (ref 4.0–10.5)

## 2015-08-09 LAB — ETHANOL: Alcohol, Ethyl (B): <5 mg/dL (ref <5)

## 2015-08-09 LAB — COMPREHENSIVE METABOLIC PANEL
ALK PHOS: 59 U/L (ref 38–126)
ALT: 21 U/L (ref 14–54)
AST: 17 U/L (ref 15–41)
Albumin: 3.8 g/dL (ref 3.5–5.0)
Anion gap: 7 (ref 5–15)
BILIRUBIN TOTAL: 0.4 mg/dL (ref 0.3–1.2)
BUN: 17 mg/dL (ref 6–20)
CO2: 30 mmol/L (ref 22–32)
CREATININE: 0.84 mg/dL (ref 0.44–1.00)
Calcium: 9.2 mg/dL (ref 8.9–10.3)
Chloride: 105 mmol/L (ref 101–111)
GFR calc Af Amer: >60 mL/min (ref >60)
Glucose, Bld: 105 mg/dL — ABNORMAL HIGH (ref 65–99)
Potassium: 4.2 mmol/L (ref 3.5–5.1)
Sodium: 142 mmol/L (ref 135–145)
TOTAL PROTEIN: 7.3 g/dL (ref 6.5–8.1)

## 2015-08-09 LAB — RAPID URINE DRUG SCREEN, HOSP PERFORMED
AMPHETAMINES: NOT DETECTED
Barbiturates: NOT DETECTED
Benzodiazepines: NOT DETECTED
Cocaine: POSITIVE — AB
OPIATES: NOT DETECTED
Tetrahydrocannabinol: NOT DETECTED

## 2015-08-09 LAB — VALPROIC ACID LEVEL: VALPROIC ACID LVL: 19 ug/mL — AB (ref 50.0–100.0)

## 2015-08-09 MED ORDER — AMANTADINE HCL 100 MG PO CAPS
100.0000 mg | ORAL_CAPSULE | Freq: Two times a day (BID) | ORAL | Status: DC
  Administered 2015-08-09 – 2015-08-10 (×2): 100 mg via ORAL
  Filled 2015-08-09 (×3): qty 1

## 2015-08-09 MED ORDER — LORAZEPAM 1 MG PO TABS
0.0000 mg | ORAL_TABLET | Freq: Four times a day (QID) | ORAL | Status: DC
Start: 2015-08-09 — End: 2015-08-09

## 2015-08-09 MED ORDER — QUETIAPINE FUMARATE 50 MG PO TABS
50.0000 mg | ORAL_TABLET | Freq: Every day | ORAL | Status: DC
  Administered 2015-08-09: 50 mg via ORAL
  Filled 2015-08-09: qty 1

## 2015-08-09 MED ORDER — VITAMIN B-1 100 MG PO TABS
100.0000 mg | ORAL_TABLET | Freq: Every day | ORAL | Status: DC
  Administered 2015-08-09: 100 mg via ORAL
  Filled 2015-08-09: qty 1

## 2015-08-09 MED ORDER — LORAZEPAM 1 MG PO TABS: 0.0000 mg | ORAL_TABLET | Freq: Two times a day (BID) | ORAL | Status: DC

## 2015-08-09 MED ORDER — LORAZEPAM 1 MG PO TABS: 1.0000 mg | ORAL_TABLET | Freq: Three times a day (TID) | ORAL | Status: DC | PRN

## 2015-08-09 MED ORDER — RISPERIDONE 2 MG PO TABS
2.0000 mg | ORAL_TABLET | Freq: Two times a day (BID) | ORAL | Status: DC
  Administered 2015-08-09 – 2015-08-10 (×2): 2 mg via ORAL
  Filled 2015-08-09 (×2): qty 1

## 2015-08-09 MED ORDER — THIAMINE HCL 100 MG/ML IJ SOLN: 100.0000 mg | Freq: Every day | INTRAMUSCULAR | Status: DC

## 2015-08-09 MED ORDER — IBUPROFEN 200 MG PO TABS: 600.0000 mg | ORAL_TABLET | Freq: Three times a day (TID) | ORAL | Status: DC | PRN

## 2015-08-09 MED ORDER — DIVALPROEX SODIUM ER 500 MG PO TB24
1000.0000 mg | ORAL_TABLET | Freq: Two times a day (BID) | ORAL | Status: DC
  Administered 2015-08-09 – 2015-08-10 (×2): 1000 mg via ORAL
  Filled 2015-08-09 (×2): qty 2

## 2015-08-09 MED ORDER — RISPERIDONE 2 MG PO TABS: 2.0000 mg | ORAL_TABLET | Freq: Two times a day (BID) | ORAL | Status: DC

## 2015-08-09 MED ORDER — NICOTINE 21 MG/24HR TD PT24
21.0000 mg | MEDICATED_PATCH | Freq: Every day | TRANSDERMAL | Status: DC
  Administered 2015-08-09 – 2015-08-10 (×2): 21 mg via TRANSDERMAL
  Filled 2015-08-09 (×2): qty 1

## 2015-08-09 MED ORDER — HYDROXYZINE HCL 25 MG PO TABS: 25.0000 mg | ORAL_TABLET | Freq: Every evening | ORAL | Status: DC | PRN

## 2015-08-09 MED ORDER — ALUM & MAG HYDROXIDE-SIMETH 200-200-20 MG/5ML PO SUSP: 30.0000 mL | ORAL | Status: DC | PRN

## 2015-08-09 MED ORDER — ONDANSETRON HCL 4 MG PO TABS: 4.0000 mg | ORAL_TABLET | Freq: Three times a day (TID) | ORAL | Status: DC | PRN

## 2015-08-09 MED ORDER — AMANTADINE HCL 100 MG PO CAPS
100.0000 mg | ORAL_CAPSULE | Freq: Two times a day (BID) | ORAL | Status: DC
  Filled 2015-08-09 (×2): qty 1
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
## 2015-08-09 NOTE — ED Notes (Signed)
Patient noted sleeping in room. No complaints, stable, in no acute distress. Q15 minute rounds and monitoring via Security Cameras to continue.  

## 2015-08-09 NOTE — ED Notes (Signed)
Report from Daniel McCool RN. Patient sleeping, respirations regular and unlabored. Q15 minute rounds and security camera observation to continue.   

## 2015-08-09 NOTE — ED Notes (Signed)
MD Dr. Radford PaxBeaton contacted regarding BP 193/106- patient asymptomatic, CIWA score 0.  Per Dr. Radford PaxBeaton recheck BP in an hour and contact him with an update.  Will notify oncoming RN.

## 2015-08-09 NOTE — ED Notes (Signed)
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

## 2015-08-09 NOTE — ED Provider Notes (Signed)
CSN: [id number]     Arrival date & time 08/09/15  1200 History   First MD Initiated Contact with Patient 08/09/15 1224     Chief Complaint  Patient presents with  . crack detox      (Consider location/radiation/quality/duration/timing/severity/associated sxs/prior Treatment) The history is provided by the patient.     Patient with hx bipolar disorder, schizoaffective schizophrenia presents requesting admission to behavioral health for alcohol abuse and her bipolar medications not working.  States she takes her medications but drinks a lot of alcohol with them.  She feels depressed, is sleepy all the time, cries a lot, feels like hurting people.  Denies SI.  Told EMS she needed help with crack, denies to me that she currently uses crack, states she needs help with alcohol.    Past Medical History  Diagnosis Date  . Arthritis   . Seizures (HCC)   . Psychiatric illness   . Bipolar 1 disorder (HCC)   . Schizo-affective schizophrenia (HCC)   . Obesity   . Substance abuse    Past Surgical History  Procedure Laterality Date  . Stomach surgery     Family History  Problem Relation Age of Onset  . Alzheimer's disease Mother   . Depression Mother    Social History  Substance Use Topics  . Smoking status: Current Every Day Smoker -- 1.00 packs/day    Types: Cigarettes  . Smokeless tobacco: Never Used  . Alcohol Use: 3.6 oz/week    6 Cans of beer per week   OB History    No data available     Review of Systems  Unable to perform ROS: Psychiatric disorder      Allergies  Review of patient's allergies indicates no known allergies.  Home Medications   Prior to Admission medications   Medication Sig Start Date End Date Taking? Authorizing Provider  amantadine (SYMMETREL) 100 MG capsule Take 100 mg by mouth 2 (two) times daily.    Historical Provider, MD  divalproex (DEPAKOTE ER) 500 MG 24 hr tablet Take 1 tablet (500 mg total) by mouth 2 (two) times daily. Patient taking  differently: Take 1,000 mg by mouth 2 (two) times daily.  04/02/13   Zannie CovePreetha Joseph, MD  hydrOXYzine (VISTARIL) 25 MG capsule Take 25-50 mg by mouth at bedtime as needed. For insomnia    Historical Provider, MD  QUEtiapine (SEROQUEL) 50 MG tablet Take 50 mg by mouth at bedtime.    Historical Provider, MD  risperiDONE (RISPERDAL) 2 MG tablet Take 1 tablet (2 mg total) by mouth 2 (two) times daily. Take 2 mg po  Bid daily 08/28/14   Earney NavyJosephine C Onuoha, NP  risperiDONE microspheres (RISPERDAL CONSTA) 50 MG injection Inject 50 mg into the muscle every 14 (fourteen) days.    Historical Provider, MD   BP 163/91 mmHg  Pulse 90  Temp(Src) 98.1 F (36.7 C) (Oral)  Resp 18  SpO2 99%  LMP 01/13/2012 Physical Exam  Constitutional: She appears well-developed and well-nourished. No distress.  HENT:  Head: Normocephalic and atraumatic.  Neck: Neck supple.  Cardiovascular: Normal rate and regular rhythm.   Pulmonary/Chest: Effort normal and breath sounds normal. No respiratory distress. She has no wheezes. She has no rales.  Abdominal: Soft. She exhibits no distension. There is no tenderness. There is no rebound and no guarding.  Neurological: She is alert.  Skin: She is not diaphoretic.  Psychiatric: Her affect is labile. Her speech is tangential. She is agitated. She expresses no suicidal ideation.  Labile mood.  Pt angry and aggressive then crying.  Speech is tangential.    Nursing note and vitals reviewed.   ED Course  Procedures (including critical care time) Labs Review Labs Reviewed - No data to display  Imaging Review No results found. I have personally reviewed and evaluated these images and lab results as part of my medical decision-making.   EKG Interpretation None      MDM   Final diagnoses:  Schizoaffective disorder, bipolar type (HCC)  Homicidal ideation   Pt with psychiatric disorders including bipolar, schizophrenia, polysubstance abuse requesting help.  Mood is labile,  speech is tangential.  TTS recommends overnight observation with reevaluation in the morning.  Medically clear.  Pt is voluntary.     Trixie Dredge, PA-C 08/09/15 2111  Tilden Fossa, MD 08/10/15 1256

## 2015-08-09 NOTE — ED Notes (Signed)
Patient noted in room. No complaints, stable, in no acute distress. Q15 minute rounds and monitoring via Security Cameras to continue.  

## 2015-08-10 DIAGNOSIS — F250 Schizoaffective disorder, bipolar type: Secondary | ICD-10-CM

## 2015-08-10 MED ORDER — LISINOPRIL 5 MG PO TABS
5.0000 mg | ORAL_TABLET | Freq: Every day | ORAL | Status: DC
  Administered 2015-08-10: 5 mg via ORAL
  Filled 2015-08-10: qty 1

## 2015-08-10 MED ORDER — POTASSIUM CHLORIDE CRYS ER 10 MEQ PO TBCR: 10.0000 meq | EXTENDED_RELEASE_TABLET | Freq: Every day | ORAL | Status: DC

## 2015-08-10 MED ORDER — POTASSIUM CHLORIDE CRYS ER 10 MEQ PO TBCR
10.0000 meq | EXTENDED_RELEASE_TABLET | Freq: Every day | ORAL | Status: DC
  Administered 2015-08-10: 10 meq via ORAL
  Filled 2015-08-10: qty 1

## 2015-08-10 MED ORDER — HYDROCHLOROTHIAZIDE 12.5 MG PO CAPS
12.5000 mg | ORAL_CAPSULE | Freq: Every day | ORAL | Status: DC
  Administered 2015-08-10: 12.5 mg via ORAL
  Filled 2015-08-10: qty 1

## 2015-08-10 MED ORDER — LISINOPRIL 5 MG PO TABS: 5.0000 mg | ORAL_TABLET | Freq: Every day | ORAL | Status: DC

## 2015-08-10 MED ORDER — RISPERIDONE 2 MG PO TABS: 2.0000 mg | ORAL_TABLET | Freq: Two times a day (BID) | ORAL | Status: DC

## 2015-08-10 MED ORDER — HYDROCHLOROTHIAZIDE 12.5 MG PO CAPS: 12.5000 mg | ORAL_CAPSULE | Freq: Every day | ORAL | Status: DC

## 2015-08-10 NOTE — ED Notes (Signed)
Patient noted sleeping in room. No complaints, stable, in no acute distress. Q15 minute rounds and monitoring via Security Cameras to continue.  

## 2015-08-10 NOTE — ED Notes (Signed)
Pt discharged ambulatory. Prescriptions reviewed and discharge instructions reviewed.  All belongings were returned to patient.  ACT team picking up patient in front of hospital.

## 2015-08-10 NOTE — [facility] Suicide Risk Assessment (Signed)
Suicide Risk Assessment  Discharge Assessment   [facility]   Principal Problem: Schizoaffective disorder, bipolar type [facility](HCC) Discharge Diagnoses:  Patient Active Problem List   Diagnosis Date Noted  . Schizoaffective disorder, bipolar type (HCC) [F25.0]     Priority: High  . Cocaine abuse [F14.10] 06/30/2011    Priority: High  . Hallucination [R44.3]   . Homicidal ideation [R45.850]   . Schizophrenia, unspecified type (HCC) [F20.9]   . Suicidal ideations [R45.851]   . Schizoaffective disorder (HCC) [F25.9] 03/29/2013  . Pneumonia [J18.9] 03/29/2013  . Non-compliant patient [Z91.19] 06/29/2011  . Hypertension [I10] 06/29/2011  . Alcohol dependence (HCC) [F10.20] 06/28/2011  . Polysubstance dependence (HCC) [F19.20] 04/03/2011  . Polysubstance abuse [F19.10] 06/21/2010    Total Time spent with patient: 45 minutes   Musculoskeletal: Strength & Muscle Tone: within normal limits Gait & Station: normal Patient leans: N/A  Psychiatric Specialty Exam: Physical Exam  Constitutional: She is oriented to person, place, and time. She appears well-developed and well-nourished.  HENT:  Head: Normocephalic.  Neck: Normal range of motion.  Respiratory: Effort normal.  Musculoskeletal: Normal range of motion.  Neurological: She is alert and oriented to person, place, and time.  Skin: Skin is warm and dry.  Psychiatric: She has a normal mood and affect. Her speech is normal and behavior is normal. Judgment and thought content normal. Cognition and memory are normal.    Review of Systems  Constitutional: Negative.   HENT: Negative.   Eyes: Negative.   Respiratory: Negative.   Cardiovascular: Negative.   Gastrointestinal: Negative.   Genitourinary: Negative.   Musculoskeletal: Negative.   Skin: Negative.   Neurological: Negative.   Endo/Heme/Allergies: Negative.   Psychiatric/Behavioral: Positive for substance abuse.    Blood pressure 172/86, pulse  89, temperature 98.4 F (36.9 C), temperature source Oral, resp. rate 20, last menstrual period 01/13/2012, SpO2 98 %.There is no weight on file to calculate BMI.  General Appearance: Casual  Eye Contact:  Good  Speech:  Normal Rate  Volume:  Normal  Mood:  Euthymic  Affect:  Congruent  Thought Process:  Coherent  Orientation:  Full (Time, Place, and Person)  Thought Content:  WDL  Suicidal Thoughts:  No  Homicidal Thoughts:  No  Memory:  Immediate;   Good Recent;   Good Remote;   Good  Judgement:  Fair  Insight:  Fair  Psychomotor Activity:  Normal  Concentration:  Concentration: Good and Attention Span: Good  Recall:  Good  Fund of Knowledge:  Good  Language:  Good  Akathisia:  No  Handed:  Right  AIMS (if indicated):     Assets:  Leisure Time Physical Health Resilience  ADL's:  Intact  Cognition:  WNL  Sleep:       Mental Status Per Nursing Assessment::   On Admission:   homicidal ideations, cocaine abuse  Demographic Factors:  NA  Loss Factors: NA  Historical Factors: NA  Risk Reduction Factors:   Sense of responsibility to family and Positive therapeutic relationship  Continued Clinical Symptoms:  None  Cognitive Features That Contribute To Risk:  None    Suicide Risk:  Minimal: No identifiable suicidal ideation.  Patients presenting with no risk factors but with morbid ruminations; may be classified as minimal risk based on the severity of the depressive symptoms    Plan Of Care/Follow-up recommendations:  Activity:  as tolerated Diet:  heart healthy diet  Inman Fettig, NP 08/10/2015, 11:11 AM

## 2015-08-10 NOTE — Consult Note (Signed)
[facility] Consult   Reason for Consult:  Homicidal ideations, cocaine abuse Referring Physician:  EDP Patient Identification: [name] MRN:  [id number] Principal Diagnosis: Schizoaffective disorder, bipolar type [facility]) Diagnosis:   Patient Active Problem List   Diagnosis Date Noted  . Schizoaffective disorder, bipolar type ([place]) [F25.0]     Priority: High  . Cocaine abuse [F14.10] 06/30/2011    Priority: High  . Hallucination [R44.3]   . Homicidal ideation [R45.850]   . Schizophrenia, unspecified type ([place]) [F20.9]   . Suicidal ideations [R45.851]   . Schizoaffective disorder ([place]) [F25.9] 03/29/2013  . Pneumonia [J18.9] 03/29/2013  . Non-compliant patient [Z91.19] 06/29/2011  . Hypertension [I10] 06/29/2011  . Alcohol dependence ([place]) [F10.20] 06/28/2011  . Polysubstance dependence ([place]) [F19.20] 04/03/2011  . Polysubstance abuse [F19.10] 06/21/2010    Total Time spent with patient:  45 minutes  Subjective:   [name] is a 55 y.o. female patient does not warrant admission.  HPI:  55 yo female who presented to the ED with homicidal ideations, vague.  Today, she is not homicidal unless "someone gets in my way", no plan and educated on legal issues if she hurts anyone.  [name] is does not have housing but is planning to get some.  Reports drinking every day but negative on admission, positive for crack/cocaine.  Denies suicidal ideations and hallucinations.  Stable for discharge.  Past Psychiatric History: schizoaffective disorder, substance abuse  Risk to Self: Suicidal Ideation: No-Not Currently/Within Last 6 Months Suicidal Intent: No-Not Currently/Within Last 6 Months Is patient at risk for suicide?: No Suicidal Plan?: No-Not Currently/Within Last 6 Months Access to Means: No What has been your use of drugs/alcohol within the last 12 months?: alcohol, cocaine How many times?: 2 Other Self Harm Risks: na Triggers for Past Attempts: Family contact,  Hallucinations, Other (Comment) Intentional Self Injurious Behavior: None Risk to Others: Homicidal Ideation: Yes-Currently Present Thoughts of Harm to Others: Yes-Currently Present Comment - Thoughts of Harm to Others: "Anyone that wont help me" Current Homicidal Intent: No-Not Currently/Within Last 6 Months Current Homicidal Plan: No-Not Currently/Within Last 6 Months Access to Homicidal Means: No Identified Victim: "Anyone who wont help me"  History of harm to others?: No Assessment of Violence: None Noted Does patient have access to weapons?: No Criminal Charges Pending?: No Does patient have a court date: No Prior Inpatient Therapy: Prior Inpatient Therapy: Yes Prior Therapy Dates: many Prior Therapy Facilty/Provider(s): many Reason for Treatment: SI/HI, Psychois Prior Outpatient Therapy: Prior Outpatient Therapy: Yes Prior Therapy Dates: current Prior Therapy Facilty/Provider(s): [facility] Reason for Treatment: Schizoaffective Does patient have an ACCT team?: Yes Does patient have Intensive In-House Services?  : No Does patient have [facility] services? : No Does patient have P4CC services?: No  Past Medical History:  Past Medical History  Diagnosis Date  . Arthritis   . Seizures ([place])   . Psychiatric illness   . Bipolar 1 disorder ([place])   . Schizo-affective schizophrenia ([place])   . Obesity   . Substance abuse     Past Surgical History  Procedure Laterality Date  . Stomach surgery     Family History:  Family History  Problem Relation Age of Onset  . Alzheimer's disease Mother   . Depression Mother    Family Psychiatric  History: none Social History:  History  Alcohol Use  . 3.6 oz/week  . 6 Cans of beer per week     History  Drug Use  . 5.00 per week  .  Special: "Crack" cocaine, Cocaine    Comment: crack/cocaine    Social History   Social History  . Marital Status: Single    Spouse Name: N/A  . Number of Children: N/A  . Years of Education:  N/A   Social History Main Topics  . Smoking status: Current Every Day Smoker -- 1.00 packs/day    Types: Cigarettes  . Smokeless tobacco: Never Used  . Alcohol Use: 3.6 oz/week    6 Cans of beer per week  . Drug Use: 5.00 per week    Special: "Crack" cocaine, Cocaine     Comment: crack/cocaine  . Sexual Activity: No   Other Topics Concern  . None   Social History Narrative   Additional Social History:    Allergies:  No Known Allergies  Labs:  Results for orders placed or performed during the hospital encounter of 08/09/15 (from the past 48 hour(s))  Comprehensive metabolic panel     Status: Abnormal   Collection Time: 08/09/15  1:12 PM  Result Value Ref Range   Sodium 142 135 - 145 mmol/L   Potassium 4.2 3.5 - 5.1 mmol/L   Chloride 105 101 - 111 mmol/L   CO2 30 22 - 32 mmol/L   Glucose, Bld 105 (H) 65 - 99 mg/dL   BUN 17 6 - 20 mg/dL   Creatinine, Ser 3.29 0.44 - 1.00 mg/dL   Calcium 9.2 8.9 - 31.9 mg/dL   Total Protein 7.3 6.5 - 8.1 g/dL   Albumin 3.8 3.5 - 5.0 g/dL   AST 17 15 - 41 U/L   ALT 21 14 - 54 U/L   Alkaline Phosphatase 59 38 - 126 U/L   Total Bilirubin 0.4 0.3 - 1.2 mg/dL   GFR calc non Af Amer >60 >60 mL/min   GFR calc Af Amer >60 >60 mL/min    Comment: (NOTE) The eGFR has been calculated using the CKD EPI equation. This calculation has not been validated in all clinical situations. eGFR's persistently <60 mL/min signify possible Chronic Kidney Disease.    Anion gap 7 5 - 15  CBC with Differential     Status: None   Collection Time: 08/09/15  1:12 PM  Result Value Ref Range   WBC 5.7 4.0 - 10.5 K/uL   RBC 4.60 3.87 - 5.11 MIL/uL   Hemoglobin 13.3 12.0 - 15.0 g/dL   HCT 21.1 32.6 - 07.7 %   MCV 89.8 78.0 - 100.0 fL   MCH 28.9 26.0 - 34.0 pg   MCHC 32.2 30.0 - 36.0 g/dL   RDW 09.6 35.4 - 07.0 %   Platelets 284 150 - 400 K/uL   Neutrophils Relative % 60 %   Neutro Abs 3.4 1.7 - 7.7 K/uL   Lymphocytes Relative 30 %   Lymphs Abs 1.7 0.7 - 4.0  K/uL   Monocytes Relative 9 %   Monocytes Absolute 0.5 0.1 - 1.0 K/uL   Eosinophils Relative 1 %   Eosinophils Absolute 0.1 0.0 - 0.7 K/uL   Basophils Relative 0 %   Basophils Absolute 0.0 0.0 - 0.1 K/uL  Ethanol     Status: None   Collection Time: 08/09/15  1:12 PM  Result Value Ref Range   Alcohol, Ethyl (B) <5 <5 mg/dL    Comment:        LOWEST DETECTABLE LIMIT FOR SERUM ALCOHOL IS 5 mg/dL FOR MEDICAL PURPOSES ONLY   Valproic acid level     Status: Abnormal   Collection Time: 08/09/15  1:12 PM  Result Value Ref Range   Valproic Acid Lvl 19 (L) 50.0 - 100.0 ug/mL  Urine rapid drug screen (hosp performed)     Status: Abnormal   Collection Time: 08/09/15  2:30 PM  Result Value Ref Range   Opiates NONE DETECTED NONE DETECTED   Cocaine POSITIVE (A) NONE DETECTED   Benzodiazepines NONE DETECTED NONE DETECTED   Amphetamines NONE DETECTED NONE DETECTED   Tetrahydrocannabinol NONE DETECTED NONE DETECTED   Barbiturates NONE DETECTED NONE DETECTED    Comment:        DRUG SCREEN FOR MEDICAL PURPOSES ONLY.  IF CONFIRMATION IS NEEDED FOR ANY PURPOSE, NOTIFY LAB WITHIN 5 DAYS.        LOWEST DETECTABLE LIMITS FOR URINE DRUG SCREEN Drug Class       Cutoff (ng/mL) Amphetamine      1000 Barbiturate      200 Benzodiazepine   353 Tricyclics       299 Opiates          300 Cocaine          300 THC              50     Current Facility-Administered Medications  Medication Dose Route Frequency Provider Last Rate Last Dose  . alum & mag hydroxide-simeth (MAALOX/MYLANTA) 200-200-20 MG/5ML suspension 30 mL  30 mL Oral PRN Clayton Bibles, PA-C      . divalproex (DEPAKOTE ER) 24 hr tablet 1,000 mg  1,000 mg Oral BID Patrecia Pour, NP   1,000 mg at 08/10/15 1004  . hydrochlorothiazide (MICROZIDE) capsule 12.5 mg  12.5 mg Oral Daily Patrecia Pour, NP   12.5 mg at 08/10/15 1004  . ibuprofen (ADVIL,MOTRIN) tablet 600 mg  600 mg Oral Q8H PRN Clayton Bibles, PA-C      . lisinopril (PRINIVIL,ZESTRIL)  tablet 5 mg  5 mg Oral Daily Patrecia Pour, NP   5 mg at 08/10/15 1004  . LORazepam (ATIVAN) tablet 1 mg  1 mg Oral Q8H PRN Clayton Bibles, PA-C      . nicotine (NICODERM CQ - dosed in mg/24 hours) patch 21 mg  21 mg Transdermal Daily Emily West, PA-C   21 mg at 08/10/15 1010  . ondansetron (ZOFRAN) tablet 4 mg  4 mg Oral Q8H PRN Clayton Bibles, PA-C      . potassium chloride (K-DUR,KLOR-CON) CR tablet 10 mEq  10 mEq Oral Daily Patrecia Pour, NP   10 mEq at 08/10/15 1004  . risperiDONE (RISPERDAL) tablet 2 mg  2 mg Oral BID Quintella Reichert, MD   2 mg at 08/10/15 1004   Current Outpatient Prescriptions  Medication Sig Dispense Refill  . divalproex (DEPAKOTE ER) 500 MG 24 hr tablet Take 1 tablet (500 mg total) by mouth 2 (two) times daily. (Patient taking differently: Take 1,000 mg by mouth 2 (two) times daily. )    . risperiDONE microspheres (RISPERDAL CONSTA) 50 MG injection Inject 50 mg into the muscle every 14 (fourteen) days.      Musculoskeletal: Strength & Muscle Tone: within normal limits Gait & Station: normal Patient leans: N/A  Psychiatric Specialty Exam: Physical Exam  Constitutional: She is oriented to person, place, and time. She appears well-developed and well-nourished.  HENT:  Head: Normocephalic.  Neck: Normal range of motion.  Respiratory: Effort normal.  Musculoskeletal: Normal range of motion.  Neurological: She is alert and oriented to person, place, and time.  Skin: Skin is warm and dry.  Psychiatric: She has a normal mood and affect. Her speech is normal and behavior is normal. Judgment and thought content normal. Cognition and memory are normal.    Review of Systems  Constitutional: Negative.   HENT: Negative.   Eyes: Negative.   Respiratory: Negative.   Cardiovascular: Negative.   Gastrointestinal: Negative.   Genitourinary: Negative.   Musculoskeletal: Negative.   Skin: Negative.   Neurological: Negative.   Endo/Heme/Allergies: Negative.    Psychiatric/Behavioral: Positive for substance abuse.    Blood pressure 172/86, pulse 89, temperature 98.4 F (36.9 C), temperature source Oral, resp. rate 20, last menstrual period 01/13/2012, SpO2 98 %.There is no weight on file to calculate BMI.  General Appearance: Casual  Eye Contact:  Good  Speech:  Normal Rate  Volume:  Normal  Mood:  Euthymic  Affect:  Congruent  Thought Process:  Coherent  Orientation:  Full (Time, Place, and Person)  Thought Content:  WDL  Suicidal Thoughts:  No  Homicidal Thoughts:  No  Memory:  Immediate;   Good Recent;   Good Remote;   Good  Judgement:  Fair  Insight:  Fair  Psychomotor Activity:  Normal  Concentration:  Concentration: Good and Attention Span: Good  Recall:  Good  Fund of Knowledge:  Good  Language:  Good  Akathisia:  No  Handed:  Right  AIMS (if indicated):     Assets:  Leisure Time Physical Health Resilience  ADL's:  Intact  Cognition:  WNL  Sleep:        Treatment Plan Summary: Daily contact with patient to assess and evaluate symptoms and progress in treatment, Medication management and Plan schizoaffective disorder, bipolar type:  -Crisis stabilization -Medication management:  Continue medical medications and Depakote 1000 mg BID for mood, Risperdal 2 mg BID for psychosis, and Ativan 1 mg every 8 hours PRN anxiety.  Start HCTZ 12.5 mg daily for HTN, Lisinopril 5 mg daily for HTN, and Potassium 10 mEq daily to prevent hypokalemia from the HCTZ -Individual and substance abuse counseling  Disposition: No evidence of imminent risk to self or others at present.    Waylan Boga, NP 08/10/2015 10:56 AM Patient seen face-to-face for psychiatric evaluation, chart reviewed and case discussed with the physician extender and developed treatment plan. Reviewed the information documented and agree with the treatment plan. Corena Pilgrim, MD

## 2015-08-10 NOTE — BH Assessment (Signed)
[facility] Assessment Progress Note  Per Mojeed Akintayo, MD, this pt does not require psychiatric hospitalizatioThedore Minsn at this time.  [facility] with recommendation to follow up with the [facility], her outpatient provider.  This has been included in pt's discharge instructions.  Pt's nurse, Kendal Hymendie, has been notified.  Doylene Canninghomas Cyndra Feinberg, MA Triage Specialist [phone]

## 2015-08-10 NOTE — Discharge Instructions (Signed)
For your ongoing behavioral health needs, you are advised to continue treatment with the [facility]: ° °     [facility] °     [address] °     [place], [place] [address] °     [phone] °

## 2015-09-12 ENCOUNTER — Emergency Department (HOSPITAL_COMMUNITY): Payer: Medicare Other

## 2015-09-12 ENCOUNTER — Encounter (HOSPITAL_COMMUNITY): Payer: Self-pay | Admitting: Emergency Medicine

## 2015-09-12 ENCOUNTER — Inpatient Hospital Stay (HOSPITAL_COMMUNITY)
Admission: EM | Admit: 2015-09-12 | Discharge: 2015-09-14 | DRG: 871 | Disposition: A | Discharge status: Home / Self Care | Payer: Medicare Other | Attending: Internal Medicine | Admitting: Internal Medicine

## 2015-09-12 DIAGNOSIS — A419 Sepsis, unspecified organism: Principal | ICD-10-CM | POA: Diagnosis present

## 2015-09-12 DIAGNOSIS — R000 Tachycardia, unspecified: Secondary | ICD-10-CM | POA: Diagnosis present

## 2015-09-12 DIAGNOSIS — F17210 Nicotine dependence, cigarettes, uncomplicated: Secondary | ICD-10-CM | POA: Diagnosis present

## 2015-09-12 DIAGNOSIS — F250 Schizoaffective disorder, bipolar type: Secondary | ICD-10-CM | POA: Diagnosis present

## 2015-09-12 DIAGNOSIS — Z6831 Body mass index (BMI) 31.0-31.9, adult: Secondary | ICD-10-CM

## 2015-09-12 DIAGNOSIS — Z79899 Other long term (current) drug therapy: Secondary | ICD-10-CM | POA: Diagnosis not present

## 2015-09-12 DIAGNOSIS — N760 Acute vaginitis: Secondary | ICD-10-CM | POA: Diagnosis present

## 2015-09-12 DIAGNOSIS — D72829 Elevated white blood cell count, unspecified: Secondary | ICD-10-CM | POA: Diagnosis present

## 2015-09-12 DIAGNOSIS — I10 Essential (primary) hypertension: Secondary | ICD-10-CM | POA: Diagnosis present

## 2015-09-12 DIAGNOSIS — R509 Fever, unspecified: Secondary | ICD-10-CM

## 2015-09-12 DIAGNOSIS — R0902 Hypoxemia: Secondary | ICD-10-CM

## 2015-09-12 DIAGNOSIS — R569 Unspecified convulsions: Secondary | ICD-10-CM | POA: Diagnosis present

## 2015-09-12 DIAGNOSIS — Z818 Family history of other mental and behavioral disorders: Secondary | ICD-10-CM

## 2015-09-12 DIAGNOSIS — J9601 Acute respiratory failure with hypoxia: Secondary | ICD-10-CM | POA: Diagnosis present

## 2015-09-12 DIAGNOSIS — E669 Obesity, unspecified: Secondary | ICD-10-CM | POA: Diagnosis present

## 2015-09-12 DIAGNOSIS — R5381 Other malaise: Secondary | ICD-10-CM | POA: Diagnosis not present

## 2015-09-12 DIAGNOSIS — Z820 Family history of epilepsy and other diseases of the nervous system: Secondary | ICD-10-CM

## 2015-09-12 DIAGNOSIS — G9340 Encephalopathy, unspecified: Secondary | ICD-10-CM | POA: Diagnosis present

## 2015-09-12 DIAGNOSIS — M1990 Unspecified osteoarthritis, unspecified site: Secondary | ICD-10-CM | POA: Diagnosis present

## 2015-09-12 DIAGNOSIS — F1910 Other psychoactive substance abuse, uncomplicated: Secondary | ICD-10-CM | POA: Diagnosis not present

## 2015-09-12 DIAGNOSIS — F1410 Cocaine abuse, uncomplicated: Secondary | ICD-10-CM | POA: Diagnosis present

## 2015-09-12 LAB — CBC WITH DIFFERENTIAL/PLATELET
Basophils Absolute: 0 10*3/uL (ref 0.0–0.1)
Basophils Relative: 0 %
EOS ABS: 0 10*3/uL (ref 0.0–0.7)
Eosinophils Relative: 0 %
HEMATOCRIT: 38.2 % (ref 36.0–46.0)
HEMOGLOBIN: 13 g/dL (ref 12.0–15.0)
LYMPHS ABS: 1.8 10*3/uL (ref 0.7–4.0)
Lymphocytes Relative: 11 %
MCH: 28.9 pg (ref 26.0–34.0)
MCHC: 34 g/dL (ref 30.0–36.0)
MCV: 84.9 fL (ref 78.0–100.0)
MONOS PCT: 10 %
Monocytes Absolute: 1.6 10*3/uL — ABNORMAL HIGH (ref 0.1–1.0)
NEUTROS PCT: 79 %
Neutro Abs: 13.2 10*3/uL — ABNORMAL HIGH (ref 1.7–7.7)
Platelets: 249 10*3/uL (ref 150–400)
RBC: 4.5 MIL/uL (ref 3.87–5.11)
RDW: 14.8 % (ref 11.5–15.5)
WBC: 16.7 10*3/uL — ABNORMAL HIGH (ref 4.0–10.5)

## 2015-09-12 LAB — RAPID URINE DRUG SCREEN, HOSP PERFORMED
Amphetamines: NOT DETECTED
Barbiturates: NOT DETECTED
Benzodiazepines: NOT DETECTED
Cocaine: POSITIVE — AB
OPIATES: NOT DETECTED
Tetrahydrocannabinol: NOT DETECTED

## 2015-09-12 LAB — COMPREHENSIVE METABOLIC PANEL
ALBUMIN: 4.1 g/dL (ref 3.5–5.0)
ALT: 31 U/L (ref 14–54)
AST: 27 U/L (ref 15–41)
Alkaline Phosphatase: 64 U/L (ref 38–126)
Anion gap: 8 (ref 5–15)
BUN: 12 mg/dL (ref 6–20)
CHLORIDE: 104 mmol/L (ref 101–111)
CO2: 26 mmol/L (ref 22–32)
CREATININE: 0.8 mg/dL (ref 0.44–1.00)
Calcium: 9.2 mg/dL (ref 8.9–10.3)
GFR calc non Af Amer: >60 mL/min (ref >60)
Glucose, Bld: 102 mg/dL — ABNORMAL HIGH (ref 65–99)
Potassium: 4.2 mmol/L (ref 3.5–5.1)
SODIUM: 138 mmol/L (ref 135–145)
Total Bilirubin: 1.1 mg/dL (ref 0.3–1.2)
Total Protein: 7.9 g/dL (ref 6.5–8.1)

## 2015-09-12 LAB — WET PREP, GENITAL
Sperm: NONE SEEN
Trich, Wet Prep: NONE SEEN
Yeast Wet Prep HPF POC: NONE SEEN

## 2015-09-12 LAB — URINALYSIS, ROUTINE W REFLEX MICROSCOPIC
Glucose, UA: NEGATIVE mg/dL
Ketones, ur: 40 mg/dL — AB
Nitrite: NEGATIVE
PROTEIN: NEGATIVE mg/dL
Specific Gravity, Urine: 1.026 (ref 1.005–1.030)
pH: 5.5 (ref 5.0–8.0)

## 2015-09-12 LAB — URINE MICROSCOPIC-ADD ON

## 2015-09-12 LAB — ETHANOL: Alcohol, Ethyl (B): <5 mg/dL (ref <5)

## 2015-09-12 LAB — I-STAT CG4 LACTIC ACID, ED: LACTIC ACID, VENOUS: 0.88 mmol/L (ref 0.5–1.9)

## 2015-09-12 MED ORDER — HYDROCHLOROTHIAZIDE 12.5 MG PO CAPS
12.5000 mg | ORAL_CAPSULE | Freq: Every day | ORAL | Status: DC
  Administered 2015-09-12 – 2015-09-14 (×2): 12.5 mg via ORAL
  Filled 2015-09-12 (×2): qty 1

## 2015-09-12 MED ORDER — VANCOMYCIN HCL IN DEXTROSE 1-5 GM/200ML-% IV SOLN
1000.0000 mg | Freq: Three times a day (TID) | INTRAVENOUS | Status: DC
  Administered 2015-09-12 – 2015-09-13 (×2): 1000 mg via INTRAVENOUS
  Filled 2015-09-12 (×3): qty 200

## 2015-09-12 MED ORDER — RISPERIDONE 2 MG PO TABS
2.0000 mg | ORAL_TABLET | Freq: Two times a day (BID) | ORAL | Status: DC
  Administered 2015-09-14: 2 mg via ORAL
  Filled 2015-09-12 (×5): qty 1

## 2015-09-12 MED ORDER — ACETAMINOPHEN 325 MG PO TABS
650.0000 mg | ORAL_TABLET | Freq: Once | ORAL | Status: AC | PRN
  Administered 2015-09-12: 650 mg via ORAL
  Filled 2015-09-12: qty 2

## 2015-09-12 MED ORDER — IBUPROFEN 200 MG PO TABS
600.0000 mg | ORAL_TABLET | Freq: Once | ORAL | Status: AC
  Administered 2015-09-12: 600 mg via ORAL
  Filled 2015-09-12: qty 3

## 2015-09-12 MED ORDER — FOLIC ACID 1 MG PO TABS
1.0000 mg | ORAL_TABLET | Freq: Every day | ORAL | Status: DC
  Administered 2015-09-14: 1 mg via ORAL
  Filled 2015-09-12: qty 1

## 2015-09-12 MED ORDER — PIPERACILLIN-TAZOBACTAM 3.375 G IVPB
3.3750 g | Freq: Three times a day (TID) | INTRAVENOUS | Status: DC
  Administered 2015-09-12 – 2015-09-13 (×2): 3.375 g via INTRAVENOUS
  Filled 2015-09-12 (×3): qty 50

## 2015-09-12 MED ORDER — LISINOPRIL 5 MG PO TABS
5.0000 mg | ORAL_TABLET | Freq: Every day | ORAL | Status: DC
  Administered 2015-09-12 – 2015-09-14 (×2): 5 mg via ORAL
  Filled 2015-09-12 (×2): qty 1

## 2015-09-12 MED ORDER — SODIUM CHLORIDE 0.9 % IV BOLUS (SEPSIS): 1000.0000 mL | Freq: Once | INTRAVENOUS | Status: DC

## 2015-09-12 MED ORDER — ADULT MULTIVITAMIN W/MINERALS CH
1.0000 | ORAL_TABLET | Freq: Every day | ORAL | Status: DC
  Administered 2015-09-14: 1 via ORAL
  Filled 2015-09-12: qty 1

## 2015-09-12 MED ORDER — SODIUM CHLORIDE 0.9 % IV BOLUS (SEPSIS)
1000.0000 mL | Freq: Once | INTRAVENOUS | Status: AC
  Administered 2015-09-12: 1000 mL via INTRAVENOUS

## 2015-09-12 MED ORDER — LORAZEPAM 2 MG/ML IJ SOLN: 1.0000 mg | INTRAMUSCULAR | Status: DC | PRN

## 2015-09-12 MED ORDER — LORAZEPAM 1 MG PO TABS: 1.0000 mg | ORAL_TABLET | Freq: Four times a day (QID) | ORAL | Status: DC | PRN

## 2015-09-12 MED ORDER — ENOXAPARIN SODIUM 40 MG/0.4ML ~~LOC~~ SOLN
40.0000 mg | SUBCUTANEOUS | Status: DC
  Administered 2015-09-12 – 2015-09-13 (×2): 40 mg via SUBCUTANEOUS
  Filled 2015-09-12 (×2): qty 0.4

## 2015-09-12 MED ORDER — VITAMIN B-1 100 MG PO TABS
100.0000 mg | ORAL_TABLET | Freq: Every day | ORAL | Status: DC
  Administered 2015-09-14: 100 mg via ORAL
  Filled 2015-09-12: qty 1

## 2015-09-12 MED ORDER — DEXTROSE-NACL 5-0.45 % IV SOLN
INTRAVENOUS | Status: DC
  Administered 2015-09-12 – 2015-09-13 (×4): via INTRAVENOUS

## 2015-09-12 MED ORDER — HYDRALAZINE HCL 20 MG/ML IJ SOLN
10.0000 mg | Freq: Once | INTRAMUSCULAR | Status: AC
  Administered 2015-09-12: 10 mg via INTRAVENOUS
  Filled 2015-09-12: qty 1

## 2015-09-12 MED ORDER — LORAZEPAM 2 MG/ML IJ SOLN
1.0000 mg | Freq: Four times a day (QID) | INTRAMUSCULAR | Status: DC | PRN
  Administered 2015-09-12 – 2015-09-13 (×2): 1 mg via INTRAVENOUS
  Filled 2015-09-12 (×3): qty 1

## 2015-09-12 MED ORDER — PIPERACILLIN-TAZOBACTAM 3.375 G IVPB 30 MIN
3.3750 g | Freq: Once | INTRAVENOUS | Status: AC
  Administered 2015-09-12: 3.375 g via INTRAVENOUS
  Filled 2015-09-12: qty 50

## 2015-09-12 MED ORDER — VANCOMYCIN HCL IN DEXTROSE 1-5 GM/200ML-% IV SOLN
1000.0000 mg | Freq: Once | INTRAVENOUS | Status: AC
  Administered 2015-09-12: 1000 mg via INTRAVENOUS
  Filled 2015-09-12: qty 200

## 2015-09-12 MED ORDER — MORPHINE SULFATE (PF) 2 MG/ML IV SOLN: 2.0000 mg | INTRAVENOUS | Status: DC | PRN

## 2015-09-12 MED ORDER — DIVALPROEX SODIUM ER 500 MG PO TB24
1000.0000 mg | ORAL_TABLET | Freq: Two times a day (BID) | ORAL | Status: DC
  Administered 2015-09-12 – 2015-09-14 (×2): 1000 mg via ORAL
  Filled 2015-09-12 (×2): qty 2

## 2015-09-12 MED ORDER — LORAZEPAM 2 MG/ML IJ SOLN
2.0000 mg | INTRAMUSCULAR | Status: DC | PRN
  Administered 2015-09-12: 2 mg via INTRAVENOUS
  Filled 2015-09-12: qty 1

## 2015-09-12 MED ORDER — THIAMINE HCL 100 MG/ML IJ SOLN: 100.0000 mg | Freq: Every day | INTRAMUSCULAR | Status: DC

## 2015-09-12 NOTE — ED Notes (Signed)
Pt BIBA, pt reports to drinking alcohol and smoking crack cocaine in the past 24 hours and "I feel real bad." Pt requesting detox and group home placement "for my Bipolar."

## 2015-09-12 NOTE — Progress Notes (Signed)
Pharmacy Antibiotic Note  Mary LefortDebra [name] is a 55 y.o. female admitted on 09/12/2015 with sepsis.  Pharmacy has been consulted for Vanc/Zosyn dosing.  Plan: Vancomycin 1g IV every 8 hours.  Goal trough 15-20 mcg/mL. Zosyn 3.375g IV q8h (4 hour infusion).  Height: 5\' 7"  (170.2 cm) Weight: 200 lb (90.719 kg) IBW/kg (Calculated) : 61.6  Temp (24hrs), Avg:101.6 F (38.7 C), Min:100.2 F (37.9 C), Max:102.9 F (39.4 C)   Recent Labs Lab 09/12/15 0727 09/12/15 0806  WBC 16.7*  --   CREATININE 0.80  --   LATICACIDVEN  --  0.88    Estimated Creatinine Clearance: 92.9 mL/min (by C-G formula based on Cr of 0.8).    No Known Allergies   Thank you for allowing pharmacy to be a part of this patient's care.   Hessie KnowsJustin M Kaydon Creedon, PharmD, BCPS Pager [phone] 09/12/2015 11:52 AM

## 2015-09-12 NOTE — ED Provider Notes (Signed)
CSN: [id number]     Arrival date & time 09/12/15  0551 History   First MD Initiated Contact with Patient 09/12/15 [phone]     Chief Complaint  Patient presents with  . Drug / Alcohol Assessment   HPI Comments: 55 year old female presents with fever and generalized malaise. PMH significant for bipolar disorder, schizoaffective d/o, polysubstance abuse. Patient is a poor historian. She states she is currently renting a room in a house and reports not feeling safe due to everyone doing drugs around her. She has been "feeling bad" since last night. Reports fever, headache, ear pain, nasal congestion, a "knot on her head", sore throat, SOB, cough, low back pain, and lower abdominal pain. Denies chest pain, upper abdominal pain, N/V/D, dysuria, and frequency, vaginal discharge. She states she has been doing a lot of drugs lately and having unprotected sex with men. Denies IVDU. She also reports not taking her medicines because she doesn't know how to take them and they make her feel bad.  Patient is a 55 y.o. female presenting with drug/alcohol assessment.  Drug / Alcohol Assessment   Past Medical History  Diagnosis Date  . Arthritis   . Seizures ([facility])   . Psychiatric illness   . Bipolar 1 disorder ([facility])   . Schizo-affective schizophrenia ([facility])   . Obesity   . Substance abuse    Past Surgical History  Procedure Laterality Date  . Stomach surgery     Family History  Problem Relation Age of Onset  . Alzheimer's disease Mother   . Depression Mother    Social History  Substance Use Topics  . Smoking status: Current Every Day Smoker -- 1.00 packs/day    Types: Cigarettes  . Smokeless tobacco: Never Used  . Alcohol Use: 3.6 oz/week    6 Cans of beer per week   OB History    No data available     Review of Systems  Unable to perform ROS: Psychiatric disorder      Allergies  Review of patient's allergies indicates no known allergies.  Home Medications   Prior to Admission  medications   Medication Sig Start Date End Date Taking? Authorizing Provider  divalproex (DEPAKOTE ER) 500 MG 24 hr tablet Take 1 tablet (500 mg total) by mouth 2 (two) times daily. Patient taking differently: Take 1,000 mg by mouth 2 (two) times daily.  04/02/13   Zannie CovePreetha Joseph, MD  hydrochlorothiazide (MICROZIDE) 12.5 MG capsule Take 1 capsule (12.5 mg total) by mouth daily. 08/10/15   Charm RingsJamison Y Lord, NP  lisinopril (PRINIVIL,ZESTRIL) 5 MG tablet Take 1 tablet (5 mg total) by mouth daily. 08/10/15   Charm RingsJamison Y Lord, NP  potassium chloride (K-DUR,KLOR-CON) 10 MEQ tablet Take 1 tablet (10 mEq total) by mouth daily. 08/10/15   Charm RingsJamison Y Lord, NP  risperiDONE (RISPERDAL) 2 MG tablet Take 1 tablet (2 mg total) by mouth 2 (two) times daily. 08/10/15   Charm RingsJamison Y Lord, NP  risperiDONE microspheres (RISPERDAL CONSTA) 50 MG injection Inject 50 mg into the muscle every 14 (fourteen) days.    Historical Provider, MD   BP 163/97 mmHg  Pulse 117  Temp(Src) 102.9 F (39.4 C) (Oral)  Resp 20  SpO2 99%  LMP 01/13/2012   Physical Exam  Constitutional: She is oriented to person, place, and time. She appears well-developed and well-nourished. No distress.  HENT:  Head: Normocephalic and atraumatic.  Right Ear: Hearing, tympanic membrane, external ear and ear canal normal.  Left Ear: Hearing, tympanic  membrane, external ear and ear canal normal.  Nose: Mucosal edema and rhinorrhea present.  Mouth/Throat: Mucous membranes are dry.  3cm soft, fluctuant area on anterior scalp. No redness or drainage; Unable to visualize uvula and pharynx. Marked sinus congestion  Eyes: Conjunctivae are normal. Pupils are equal, round, and reactive to light. Right eye exhibits no discharge. Left eye exhibits no discharge. No scleral icterus.  Neck: Normal range of motion. Neck supple.  Cardiovascular: Regular rhythm and intact distal pulses.  Tachycardia present.  Exam reveals no gallop and no friction rub.   Murmur heard. 3/6  murmur in LUSB  Pulmonary/Chest: Effort normal. No accessory muscle usage. No respiratory distress. She has decreased breath sounds. She has no wheezes. She has no rhonchi. She has no rales. She exhibits no tenderness.  Abdominal: Soft. Bowel sounds are normal. She exhibits no distension and no mass. There is no tenderness. There is no rebound and no guarding.  Well healed mid-lined abdominal scar  Genitourinary:  No inguinal lymphadenopathy or inguinal hernia noted. Normal external genitalia. No pain with speculum insertion. Closed cervical os with normal appearance - no rash or lesions. No significant discharge or bleeding noted from cervix or in vaginal vault. On bimanual examination patient reports CMT and left adnexal tenderness however seems minimal. Chaperone present during exam.    Neurological: She is alert and oriented to person, place, and time.  Skin: Skin is warm and dry. She is not diaphoretic.  Psychiatric: She has a normal mood and affect. Her behavior is normal.    ED Course  Procedures (including critical care time) Labs Review Labs Reviewed  COMPREHENSIVE METABOLIC PANEL - Abnormal; Notable for the following:    Glucose, Bld 102 (*)    All other components within normal limits  CBC WITH DIFFERENTIAL/PLATELET - Abnormal; Notable for the following:    WBC 16.7 (*)    Neutro Abs 13.2 (*)    Monocytes Absolute 1.6 (*)    All other components within normal limits  URINALYSIS, ROUTINE W REFLEX MICROSCOPIC (NOT AT [facility]) - Abnormal; Notable for the following:    Color, Urine AMBER (*)    APPearance CLOUDY (*)    Hgb urine dipstick TRACE (*)    Bilirubin Urine SMALL (*)    Ketones, ur 40 (*)    Leukocytes, UA SMALL (*)    All other components within normal limits  URINE RAPID DRUG SCREEN, HOSP PERFORMED - Abnormal; Notable for the following:    Cocaine POSITIVE (*)    All other components within normal limits  URINE MICROSCOPIC-ADD ON - Abnormal; Notable for the  following:    Squamous Epithelial / LPF 6-30 (*)    Bacteria, UA RARE (*)    All other components within normal limits  CULTURE, BLOOD (ROUTINE X 2)  CULTURE, BLOOD (ROUTINE X 2)  URINE CULTURE  WET PREP, GENITAL  ETHANOL  I-STAT CG4 LACTIC ACID, ED  GC/CHLAMYDIA PROBE AMP ([place]) NOT AT [facility]    Imaging Review Dg Chest 2 View  09/12/2015  CLINICAL DATA:  Chest pain with fever and shortness of Breath EXAM: CHEST  2 VIEW COMPARISON:  05/05/2015 FINDINGS: Cardiac shadow is at the upper limits of normal in size. The lungs are well aerated bilaterally. No focal infiltrate or sizable effusion is seen. No bony abnormality is noted. IMPRESSION: No active cardiopulmonary disease. Electronically Signed   By: Alcide Clever M.D.   On: 09/12/2015 07:30   I have personally reviewed and evaluated these images  and lab results as part of my medical decision-making.   EKG Interpretation   Date/Time:  Saturday September 12 2015 07:10:38 EDT Ventricular Rate:  101 PR Interval:    QRS Duration: 67 QT Interval:  318 QTC Calculation: 413 R Axis:   66 Text Interpretation:  Sinus tachycardia Atrial premature complex Right  atrial enlargement Probable LVH with secondary repol abnrm Confirmed by  Fayrene FearingJAMES  MD, MARK ([id number]) on 09/12/2015 7:15:46 AM      MDM   Final diagnoses:  Sepsis, due to unspecified organism [facility](HCC)   55 year old female presents with sepsis. She is febrile in triage and tachycardic. Improved with IVF and Tylenol. She is markedly hypertensive as well. No hypoxia. Code Sepsis call due to vitals. Blood cultures drawn. Lactic acid is normal. CBC remarkable for leukocytosis of 16.7. CMP unremarkable. CXR negative. UA shows trace hgb, small bilirubin, ketones, leukocytes, and rare bacteria. UA culture sent. UDS remarkable for +cocaine. EKG is sinus tachycardia. ETOH levels normal. G&C and wet prep pending. Will have patient admitted for sepsis. Spoke wit Dr. Mickle MalloryHamad who will accept  patient.    Bethel BornKelly Marie Houda Brau, PA-C 09/12/15 1225  Melene Planan Floyd, DO 09/12/15 1235

## 2015-09-12 NOTE — ED Notes (Signed)
Report given to tanya RN on floor

## 2015-09-12 NOTE — ED Notes (Signed)
Patient transported to X-ray 

## 2015-09-12 NOTE — ED Notes (Signed)
Attempt to  call report , nurse not available 

## 2015-09-12 NOTE — ED Notes (Addendum)
Patient attempted to give urine sample x1, unsuccessful per NT Leonette Mostharles

## 2015-09-12 NOTE — ED Notes (Addendum)
Pt not cooperative for blood draw for second I stat lactic.  RN aware.

## 2015-09-12 NOTE — Progress Notes (Signed)
Notified physician there is a Suicide Precaution order for patient.  No SI sitter at bedside.  Physician coming to room.  Orders CIWA protocol, restrain if trying to pull out lines, ativan q3hr 1mg  IV PRn for agitation.  Physician came to room and verbally ordered to discontinue Suicide Precautions.

## 2015-09-12 NOTE — H&P (Signed)
History and Physical  [name] ZOX:[id number] DOB: 05/05/1960 DOA: 09/12/2015  PCP:  PROVIDER NOT IN SYSTEM   Chief Complaint:  "feeling sick"  History of Present Illness:  Patient is a 55 yo female with history of Bipolar disorder and schizoaffective disorder and substance abuse came with cc of generalized malaise for the past day. He said she had cocaine and alcohol Friday night then last night she started feeling"sick"having headaches, nasal congestion, chest tightness, pain in her back and  Legs, dry cough and a fever.No N//V/D/C/abd pain/vaginal discharge.   Review of Systems:  CONSTITUTIONAL:     No night sweats.  +fatigue.  +fever. No chills. Eyes:                            No visual changes.  No eye pain.  No eye discharge.  + Photophobia  ENT:                              No epistaxis.  +sinus pain.  No sore throat.   +congestion. RESPIRATORY:           +cough.  No wheeze.  No hemoptysis.  +dyspnea CARDIOVASCULAR   :  +chest pains.  No palpitations. GASTROINTESTINAL:  No abdominal pain.  No nausea. No vomiting.  No diarrhea. No constipation.  No hematemesis.  No hematochezia.  No melena. GENITOURINARY:      No urgency.  No frequency.  No dysuria.  No hematuria.  No obstructive symptoms.  No discharge.  No pain.  MUSCULOSKELETAL:  +musculoskeletal pain.  No joint swelling.  No arthritis. NEUROLOGICAL:        +confusion.  ++weakness. No headache. No seizure. PSYCHIATRIC:             No depression. No anxiety. No suicidal ideation. SKIN:                             No rashes.  No lesions.  No wounds. ENDOCRINE:                No weight loss.  No polydipsia.  No polyuria.  No polyphagia. HEMATOLOGIC:           No purpura.  No petechiae.  No bleeding.  ALLERGIC                 : No pruritus.  No angioedema Other:  Past Medical and Surgical History:   Past Medical History  Diagnosis Date  . Arthritis   . Seizures (HCC)   . Psychiatric illness   . Bipolar 1  disorder (HCC)   . Schizo-affective schizophrenia (HCC)   . Obesity   . Substance abuse    Past Surgical History  Procedure Laterality Date  . Stomach surgery      Social History:   reports that she has been smoking Cigarettes.  She has been smoking about 1.00 pack per day. She has never used smokeless tobacco. She reports that she drinks about 3.6 oz of alcohol per week. She reports that she uses illicit drugs ("Crack" cocaine and Cocaine) about 5 times per week.    No Known Allergies  Family History  Problem Relation Age of Onset  . Alzheimer's disease Mother   . Depression Mother       Prior to Admission medications   Medication Sig Start Date  End Date Taking? Authorizing Provider  divalproex (DEPAKOTE ER) 500 MG 24 hr tablet Take 1 tablet (500 mg total) by mouth 2 (two) times daily. Patient taking differently: Take 1,000 mg by mouth 2 (two) times daily.  04/02/13  Yes Zannie CovePreetha Joseph, MD  hydrochlorothiazide (MICROZIDE) 12.5 MG capsule Take 1 capsule (12.5 mg total) by mouth daily. 08/10/15  Yes Charm RingsJamison Y Lord, NP  lisinopril (PRINIVIL,ZESTRIL) 5 MG tablet Take 1 tablet (5 mg total) by mouth daily. 08/10/15  Yes Charm RingsJamison Y Lord, NP  potassium chloride (K-DUR,KLOR-CON) 10 MEQ tablet Take 1 tablet (10 mEq total) by mouth daily. 08/10/15  Yes Charm RingsJamison Y Lord, NP  risperiDONE (RISPERDAL) 2 MG tablet Take 1 tablet (2 mg total) by mouth 2 (two) times daily. 08/10/15  Yes Charm RingsJamison Y Lord, NP  risperiDONE microspheres (RISPERDAL CONSTA) 50 MG injection Inject 50 mg into the muscle every 14 (fourteen) days.   Yes Historical Provider, MD    Physical Exam: BP 198/84 mmHg  Pulse 94  Temp(Src) 101.7 F (38.7 C) (Oral)  Resp 23  Ht 5\' 7"  (1.702 m)  Wt 90.719 kg (200 lb)  BMI 31.32 kg/m2  SpO2 89%  LMP 01/13/2012  GENERAL :  appears to be in mild acute distress. HEAD:           normocephalic. EYES:            PERRL, EOMI.  Marland Kitchen. EARS:           hearing grossly intact. NOSE:           No  nasal discharge. THROAT:     Oral cavity and pharynx dry.   NECK:          supple, non-tender.  CARDIAC:    Normal S1 and S2. No gallop. Systolic murmur at LSB.  Vascular:     no peripheral edema. LUNGS:       Clear to auscultation  ABDOMEN: Positive bowel sounds. Soft, nondistended, nontender. No guarding or rebound.      MSK:           No joint erythema or tenderness.  EXT           : No significant deformity or joint abnormality. Neuro        : Alert, oriented to person, place, and time.                      CN II-XII intact.                        SKIN:            No rash. No lesions.           Labs on Admission:  Reviewed.   Radiological Exams on Admission: Dg Chest 2 View  09/12/2015  CLINICAL DATA:  Chest pain with fever and shortness of Breath EXAM: CHEST  2 VIEW COMPARISON:  05/05/2015 FINDINGS: Cardiac shadow is at the upper limits of normal in size. The lungs are well aerated bilaterally. No focal infiltrate or sizable effusion is seen. No bony abnormality is noted. IMPRESSION: No active cardiopulmonary disease. Electronically Signed   By: Alcide CleverMark  Lukens M.D.   On: 09/12/2015 07:30    EKG:  Independently reviewed. Sinus tachycardia   Assessment/Plan  Polysubstance abuse:  Symptoms are likely related with cocaine/alcohol withdrawal CIWA protocol , ativan prn Keep on IVF Suicide precautions  SW consult before discharge  Morphine prn pain  Initial EKG without ischemic changes. Will keep on Tele.   Leukocytosis is likely reactive, patient had abx in the ER.will hold awaiting culture/fever recurrence.Tests for STD in process ( patient reports unprotected sex with men ).   HTN: cont home meds.  Bipolar : cont home meds.  Input & Output: NA Lines & Tubes:PIV DVT prophylaxis: [place] enoxaparin  GI prophylaxis: NA Consultants: NA Code Status: Full Family Communication: None at bedside [facility]     Eston EstersAhmad Joseh Sjogren M.D [facility]

## 2015-09-12 NOTE — ED Notes (Signed)
Bed: WA09 Expected date: 09/12/15 Expected time:  Means of arrival:  Comments: ETOH/TACHY

## 2015-09-12 NOTE — ED Notes (Signed)
Kelly, PA at bedside. 

## 2015-09-12 NOTE — ED Notes (Signed)
Report given to Andrea, RN.

## 2015-09-12 NOTE — ED Notes (Signed)
Pt to XR

## 2015-09-13 ENCOUNTER — Inpatient Hospital Stay (HOSPITAL_COMMUNITY): Payer: Medicare Other

## 2015-09-13 DIAGNOSIS — R509 Fever, unspecified: Secondary | ICD-10-CM | POA: Diagnosis present

## 2015-09-13 DIAGNOSIS — F250 Schizoaffective disorder, bipolar type: Secondary | ICD-10-CM

## 2015-09-13 DIAGNOSIS — D72829 Elevated white blood cell count, unspecified: Secondary | ICD-10-CM

## 2015-09-13 DIAGNOSIS — A499 Bacterial infection, unspecified: Secondary | ICD-10-CM

## 2015-09-13 DIAGNOSIS — N760 Acute vaginitis: Secondary | ICD-10-CM

## 2015-09-13 DIAGNOSIS — G9340 Encephalopathy, unspecified: Secondary | ICD-10-CM | POA: Diagnosis present

## 2015-09-13 DIAGNOSIS — F1910 Other psychoactive substance abuse, uncomplicated: Secondary | ICD-10-CM

## 2015-09-13 LAB — COMPREHENSIVE METABOLIC PANEL
ALBUMIN: 3.8 g/dL (ref 3.5–5.0)
ALK PHOS: 68 U/L (ref 38–126)
ALT: 30 U/L (ref 14–54)
ANION GAP: 9 (ref 5–15)
AST: 24 U/L (ref 15–41)
BILIRUBIN TOTAL: 0.4 mg/dL (ref 0.3–1.2)
BUN: 8 mg/dL (ref 6–20)
CALCIUM: 8.9 mg/dL (ref 8.9–10.3)
CO2: 25 mmol/L (ref 22–32)
Chloride: 98 mmol/L — ABNORMAL LOW (ref 101–111)
Creatinine, Ser: 0.7 mg/dL (ref 0.44–1.00)
GFR calc non Af Amer: >60 mL/min (ref >60)
GLUCOSE: 146 mg/dL — AB (ref 65–99)
POTASSIUM: 3.9 mmol/L (ref 3.5–5.1)
Sodium: 132 mmol/L — ABNORMAL LOW (ref 135–145)
TOTAL PROTEIN: 8.2 g/dL — AB (ref 6.5–8.1)

## 2015-09-13 LAB — D-DIMER, QUANTITATIVE: D-Dimer, Quant: 0.53 ug/mL-FEU — ABNORMAL HIGH (ref 0.00–0.50)

## 2015-09-13 LAB — BLOOD GAS, ARTERIAL
Acid-Base Excess: 3.8 mmol/L — ABNORMAL HIGH (ref 0.0–2.0)
BICARBONATE: 28.9 meq/L — AB (ref 20.0–24.0)
DRAWN BY: 257701
O2 SAT: 88.5 %
PO2 ART: 61.5 mmHg — AB (ref 80.0–100.0)
Patient temperature: 98.6
TCO2: 25.7 mmol/L (ref 0–100)
pCO2 arterial: 47.7 mmHg — ABNORMAL HIGH (ref 35.0–45.0)
pH, Arterial: 7.4 (ref 7.350–7.450)

## 2015-09-13 LAB — CBC
HEMATOCRIT: 40.7 % (ref 36.0–46.0)
HEMOGLOBIN: 13.8 g/dL (ref 12.0–15.0)
MCH: 29.2 pg (ref 26.0–34.0)
MCHC: 33.9 g/dL (ref 30.0–36.0)
MCV: 86.2 fL (ref 78.0–100.0)
PLATELETS: 234 10*3/uL (ref 150–400)
RBC: 4.72 MIL/uL (ref 3.87–5.11)
RDW: 14.5 % (ref 11.5–15.5)
WBC: 20.3 10*3/uL — AB (ref 4.0–10.5)

## 2015-09-13 LAB — VALPROIC ACID LEVEL: VALPROIC ACID LVL: 52 ug/mL (ref 50.0–100.0)

## 2015-09-13 LAB — GLUCOSE, CAPILLARY: Glucose-Capillary: 171 mg/dL — ABNORMAL HIGH (ref 65–99)

## 2015-09-13 LAB — URINE CULTURE

## 2015-09-13 LAB — SALICYLATE LEVEL

## 2015-09-13 LAB — PROCALCITONIN: Procalcitonin: <0.1 ng/mL

## 2015-09-13 LAB — ACETAMINOPHEN LEVEL: Acetaminophen (Tylenol), Serum: <10 ug/mL — ABNORMAL LOW (ref 10–30)

## 2015-09-13 LAB — TROPONIN I

## 2015-09-13 LAB — TSH: TSH: 0.718 u[IU]/mL (ref 0.350–4.500)

## 2015-09-13 LAB — AMMONIA: Ammonia: 38 umol/L — ABNORMAL HIGH (ref 9–35)

## 2015-09-13 MED ORDER — HALOPERIDOL LACTATE 5 MG/ML IJ SOLN
2.0000 mg | Freq: Four times a day (QID) | INTRAMUSCULAR | Status: DC | PRN
  Administered 2015-09-13: 2 mg via INTRAVENOUS
  Filled 2015-09-13: qty 1

## 2015-09-13 MED ORDER — CLINDAMYCIN PHOSPHATE 2 % VA CREA
1.0000 | TOPICAL_CREAM | Freq: Every day | VAGINAL | Status: DC
  Filled 2015-09-13: qty 40

## 2015-09-13 MED ORDER — METRONIDAZOLE IN NACL 5-0.79 MG/ML-% IV SOLN
500.0000 mg | Freq: Two times a day (BID) | INTRAVENOUS | Status: DC
  Administered 2015-09-13 – 2015-09-14 (×2): 500 mg via INTRAVENOUS
  Filled 2015-09-13 (×2): qty 100

## 2015-09-13 MED ORDER — SALINE SPRAY 0.65 % NA SOLN
1.0000 | NASAL | Status: DC | PRN
  Filled 2015-09-13: qty 44

## 2015-09-13 MED ORDER — IOPAMIDOL (ISOVUE-370) INJECTION 76%
100.0000 mL | Freq: Once | INTRAVENOUS | Status: AC | PRN
  Administered 2015-09-13: 100 mL via INTRAVENOUS

## 2015-09-13 NOTE — Progress Notes (Signed)
RT requested RN , Rosey Batheresa placed PT on 2 lpm [place]- Po2 on RA ABG= 61.5 (Sp02 88.5).

## 2015-09-13 NOTE — Progress Notes (Signed)
PROGRESS NOTE  [name] ZOX:[id number] DOB: [birth year] DOA: 09/12/2015 PCP: PROVIDER NOT IN SYSTEM  HPI/Recap of past 75 hours: 55 year old female with past medical history of schizoaffective disorder, hypertension and cocaine abuse presented to the emergency room on the early morning of 7/1 saying that she is last done drugs on 6/30 (positive urine drug screen for cocaine) and that she did not feel well. Documented fever of 102 in the emergency room. Lab work is noteworthy for white count of 16.7.  Blood cultures drawn and patient started on vancomycin and Zosyn. Chest x-ray and urine cultures unrevealing.  Since arrival to floor, patient has been in and out of delirium. Confused at times. Somnolent. By hospital day 2, white count further increased to 20  Patient herself is somewhat somnolent and difficult to awaken. When asked if she is hurting anywhere, she states she does not feel well and hurting all over. Does not give me much more of discussion in this.  Assessment/Plan: Active Problems:   Hypertension: Monitor blood pressures, especially in the setting of cocaine abuse. Avoid beta blockers. When necessary hydralazine. Certainly risk factors present for stroke. Check CT. See below.    Schizoaffective disorder, bipolar type [facility]): Contributing factor although I suspect this is more of an acute process rather than psychotic breakdown   Substance abuse: No history of opiate abuse and this is not presenting with alcohol withdrawal yet.   Fever, unspecified, acute encephalopathy and  Leukocytosis: Highly suspicious for some infectious process. No response to vancomycin or Zosyn. No source found so holding antibiotics. Check abdominal x-ray looking for obstipation. Checking CT scan of head for CVA or other. Troponin negative so do not think that this is stress margination from cocaine induced vasospasm. We'll check hepatitis panel, RPR, ammonia level, d-dimer, ABG, salicylate level, Tylenol  level. Monitor blood cultures.  If all this is unrevealing, will consider abdominal CT and echocardiogram as patient has a history of abdominal rectal abscess in the past. Add when necessary Haldol and check follow-up EKG in the morning  Bacterial vaginosis: Positive clue cells in the urine.  Given confusion, IV Flagyl for the next 7 days  Code Status: Full code   Family Communication: We'll try to reach family to get better insight on what is going on   Disposition Plan: Continue his inpatient until patient stabilizes    Consultants:  None   Procedures:  None   Antimicrobials:  IV vancomycin and Zosyn 7/1-7/2  IV Flagyl 7/2-7/7  DVT prophylaxis: On Lovenox   Objective: Filed Vitals:   09/12/15 1817 09/12/15 2101 09/13/15 0024 09/13/15 0608  BP: 169/93 249/88 208/97 164/90  Pulse: 114  107 103  Temp:  98.9 F (37.2 C) 98.9 F (37.2 C) 100.3 F (37.9 C)  TempSrc:  Axillary Axillary Axillary  Resp: Height:      Weight:      SpO2: 97% 95% 93% 97%    Intake/Output Summary (Last 24 hours) at 09/13/15 1339 Last data filed at 09/13/15 0645  Gross per 24 hour  Intake   1200 ml  Output      0 ml  Net   1200 ml   Filed Weights   09/12/15 1125  Weight: 90.719 kg (200 lb)    Exam:   General:  Confused, somnolent than other times agitated   Cardiovascular: Regular rate and rhythm, S1 and S2   Respiratory: Clear to auscultation bilaterally   Abdomen: Soft, nontender?, Nondistended, hypoactive bowel  sounds   Musculoskeletal: No clubbing or cyanosis or edema   Skin: No skin breaks, tears or lesions  Psychiatry: Patient confused    Data Reviewed: CBC:  Recent Labs Lab 09/12/15 0727 09/13/15 0538  WBC 16.7* 20.3*  NEUTROABS 13.2*  --   HGB 13.0 13.8  HCT 38.2 40.7  MCV 84.9 86.2  PLT 249 234   Basic Metabolic Panel:  Recent Labs Lab 09/12/15 0727 09/13/15 0538  NA 138 132*  K 4.2 3.9  CL 104 98*  CO2 26 25  GLUCOSE 102*  146*  BUN 12 8  CREATININE 0.80 0.70  CALCIUM 9.2 8.9   GFR: Estimated Creatinine Clearance: 92.9 mL/min (by C-G formula based on Cr of 0.7). Liver Function Tests:  Recent Labs Lab 09/12/15 0727 09/13/15 0538  AST 27 24  ALT 31 30  ALKPHOS 64 68  BILITOT 1.1 0.4  PROT 7.9 8.2*  ALBUMIN 4.1 3.8   No results for input(s): LIPASE, AMYLASE in the last 168 hours. No results for input(s): AMMONIA in the last 168 hours. Coagulation Profile: No results for input(s): INR, PROTIME in the last 168 hours. Cardiac Enzymes:  Recent Labs Lab 09/13/15 1217  TROPONINI <0.03   BNP (last 3 results) No results for input(s): PROBNP in the last 8760 hours. HbA1C: No results for input(s): HGBA1C in the last 72 hours. CBG:  Recent Labs Lab 09/13/15 0731  GLUCAP 171*   Lipid Profile: No results for input(s): CHOL, HDL, LDLCALC, TRIG, CHOLHDL, LDLDIRECT in the last 72 hours. Thyroid Function Tests: No results for input(s): TSH, T4TOTAL, FREET4, T3FREE, THYROIDAB in the last 72 hours. Anemia Panel: No results for input(s): VITAMINB12, FOLATE, FERRITIN, TIBC, IRON, RETICCTPCT in the last 72 hours. Urine analysis:    Component Value Date/Time   COLORURINE AMBER* 09/12/2015 0816   APPEARANCEUR CLOUDY* 09/12/2015 0816   LABSPEC 1.026 09/12/2015 0816   PHURINE 5.5 09/12/2015 0816   GLUCOSEU NEGATIVE 09/12/2015 0816   HGBUR TRACE* 09/12/2015 0816   BILIRUBINUR SMALL* 09/12/2015 0816   KETONESUR 40* 09/12/2015 0816   PROTEINUR NEGATIVE 09/12/2015 0816   UROBILINOGEN 0.2 12/08/2014 1937   NITRITE NEGATIVE 09/12/2015 0816   LEUKOCYTESUR SMALL* 09/12/2015 0816   Sepsis Labs: @LABRCNTIP (procalcitonin:4,lacticidven:4)  ) Recent Results (from the past 240 hour(s))  Blood Culture (routine x 2)     Status: None (Preliminary result)   Collection Time: 09/12/15  7:50 AM  Result Value Ref Range Status   Specimen Description BLOOD BLOOD LEFT ARM  Final   Special Requests   Final     BOTTLES DRAWN AEROBIC AND ANAEROBIC 5 CC Performed at [facility]    Culture PENDING  Incomplete   Report Status PENDING  Incomplete  Blood Culture (routine x 2)     Status: None (Preliminary result)   Collection Time: 09/12/15  8:16 AM  Result Value Ref Range Status   Specimen Description BLOOD RIGHT HAND  Final   Special Requests   Final    BOTTLES DRAWN AEROBIC AND ANAEROBIC 5 CC Performed at [facility]    Culture PENDING  Incomplete   Report Status PENDING  Incomplete  Urine culture     Status: Abnormal   Collection Time: 09/12/15  8:16 AM  Result Value Ref Range Status   Specimen Description URINE, CLEAN CATCH  Final   Special Requests NONE  Final   Culture MULTIPLE SPECIES PRESENT, SUGGEST RECOLLECTION (A)  Final   Report Status 09/13/2015 FINAL  Final  Wet prep,  genital     Status: Abnormal   Collection Time: 09/12/15 12:00 PM  Result Value Ref Range Status   Yeast Wet Prep HPF POC NONE SEEN NONE SEEN Final   Trich, Wet Prep NONE SEEN NONE SEEN Final   Clue Cells Wet Prep HPF POC PRESENT (A) NONE SEEN Final   WBC, Wet Prep HPF POC MODERATE (A) NONE SEEN Final   Sperm NONE SEEN  Final      Studies: No results found.  Scheduled Meds: . clindamycin  1 Applicatorful Vaginal QHS  . divalproex  1,000 mg Oral BID  . enoxaparin (LOVENOX) injection  40 mg Subcutaneous Q24H  . folic acid  1 mg Oral Daily  . hydrochlorothiazide  12.5 mg Oral Daily  . lisinopril  5 mg Oral Daily  . multivitamin with minerals  1 tablet Oral Daily  . risperiDONE  2 mg Oral BID  . thiamine  100 mg Oral Daily   Or  . thiamine  100 mg Intravenous Daily    Continuous Infusions: . dextrose 5 % and 0.45% NaCl 100 mL/hr at 09/13/15 1048     LOS: 1 day   Time spent: 40 minutes  Hollice EspyKRISHNAN,Karmen Altamirano K, MD [facility] Pager [phone]  If 7PM-7AM, please contact night-coverage [email or web address] Password [email or web address] 09/13/2015, 1:39 PM

## 2015-09-14 DIAGNOSIS — J9601 Acute respiratory failure with hypoxia: Secondary | ICD-10-CM

## 2015-09-14 LAB — CBC WITH DIFFERENTIAL/PLATELET
Basophils Absolute: 0 10*3/uL (ref 0.0–0.1)
Basophils Relative: 0 %
Eosinophils Absolute: 0 10*3/uL (ref 0.0–0.7)
Eosinophils Relative: 0 %
HEMATOCRIT: 41.6 % (ref 36.0–46.0)
HEMOGLOBIN: 13.7 g/dL (ref 12.0–15.0)
LYMPHS ABS: 1.5 10*3/uL (ref 0.7–4.0)
LYMPHS PCT: 12 %
MCH: 28.8 pg (ref 26.0–34.0)
MCHC: 32.9 g/dL (ref 30.0–36.0)
MCV: 87.4 fL (ref 78.0–100.0)
MONO ABS: 1.8 10*3/uL — AB (ref 0.1–1.0)
MONOS PCT: 14 %
NEUTROS ABS: 9.8 10*3/uL — AB (ref 1.7–7.7)
NEUTROS PCT: 74 %
Platelets: 228 10*3/uL (ref 150–400)
RBC: 4.76 MIL/uL (ref 3.87–5.11)
RDW: 14.5 % (ref 11.5–15.5)
WBC: 13.1 10*3/uL — ABNORMAL HIGH (ref 4.0–10.5)

## 2015-09-14 LAB — COMPREHENSIVE METABOLIC PANEL
ALK PHOS: 55 U/L (ref 38–126)
ALT: 22 U/L (ref 14–54)
ANION GAP: 7 (ref 5–15)
AST: 17 U/L (ref 15–41)
Albumin: 3.4 g/dL — ABNORMAL LOW (ref 3.5–5.0)
BILIRUBIN TOTAL: 0.6 mg/dL (ref 0.3–1.2)
BUN: 12 mg/dL (ref 6–20)
CALCIUM: 8.7 mg/dL — AB (ref 8.9–10.3)
CO2: 28 mmol/L (ref 22–32)
Chloride: 102 mmol/L (ref 101–111)
Creatinine, Ser: 1.28 mg/dL — ABNORMAL HIGH (ref 0.44–1.00)
GFR calc Af Amer: 54 mL/min — ABNORMAL LOW (ref >60)
GFR, EST NON AFRICAN AMERICAN: 47 mL/min — AB (ref >60)
GLUCOSE: 146 mg/dL — AB (ref 65–99)
POTASSIUM: 3.7 mmol/L (ref 3.5–5.1)
Sodium: 137 mmol/L (ref 135–145)
TOTAL PROTEIN: 7.3 g/dL (ref 6.5–8.1)

## 2015-09-14 LAB — HEPATITIS PANEL, ACUTE
HEP B S AG: NEGATIVE
Hep A IgM: NEGATIVE
Hep B C IgM: NEGATIVE

## 2015-09-14 LAB — GC/CHLAMYDIA PROBE AMP (~~LOC~~) NOT AT ARMC
CHLAMYDIA, DNA PROBE: NEGATIVE
NEISSERIA GONORRHEA: NEGATIVE

## 2015-09-14 LAB — RPR: RPR Ser Ql: NONREACTIVE

## 2015-09-14 LAB — GLUCOSE, CAPILLARY: GLUCOSE-CAPILLARY: 143 mg/dL — AB (ref 65–99)

## 2015-09-14 LAB — HIV ANTIBODY (ROUTINE TESTING W REFLEX): HIV SCREEN 4TH GENERATION: NONREACTIVE

## 2015-09-14 MED ORDER — CIPROFLOXACIN HCL 500 MG PO TABS: 500.0000 mg | ORAL_TABLET | Freq: Two times a day (BID) | ORAL | Status: DC

## 2015-09-14 NOTE — Clinical Social Work Note (Signed)
Clinical Social Work Assessment  Patient Details  Name: Mary LefortDebra Kinker MRN: [id number] Date of Birth: 08/16/1960  Date of referral:  09/14/15               Reason for consult:  Facility Placement                Permission sought to share information with:  Case Manager Permission granted to share information::     Name::        Agency::     Relationship::     Contact Information:     Housing/Transportation Living arrangements for the past 2 months:  Apartment Source of Information:  Patient Patient Interpreter Needed:  None Criminal Activity/Legal Involvement Pertinent to Current Situation/Hospitalization:    Significant Relationships:  Merchandiser, retailCommunity Support Lives with:  Self Do you feel safe going back to the place where you live?  Yes Need for family participation in patient care:  No (Coment)  Care giving concerns: Patient reports she has been living in an apartment on in a high drug area where she has easy access to drugs. She states she was partying part of last week/weekend and used drugs. Patient says she only did it because she was partying. Patient reports she has bi-polar and  has community support with ACT team and has community Child psychotherapistsocial worker, Burnett Shengicole Leach.   Patient reports she has used outpatient and residential treatment centers in [place]. Patient [place] treatment places in [place].   Social Worker assessment / plan:  Patient was reluctant to speak with LCSWA about drugs use and treatment. Patient reports she was in the hospital because she felt sick after partying and using drugs. Patient did not want to go into detail about her drug use. Patient agitiated about not seeing MD and wanting to be discharged. Patient reports she has a court date this week, and needs to pick up her check today to pay her bills. Patient stated she has used several outpatient and residential treatment programs for substance abuse. Patient is very aware of options for  treatment. She is not interested at this time. LCSWA left patient with substance abuse treatment resources list.   Employment status:    Insurance information:  Medicare PT Recommendations:  Not assessed at this time Information / Referral to community resources:  Residential Substance Abuse Treatment Options, Outpatient Substance Abuse Treatment Options  Patient/Family's Response to care: Agreeable  Patient/Family's Understanding of and Emotional Response to Diagnosis, Current Treatment, and Prognosis:  Patient reports "Them treatment facilities do not work for me, I am clean for like three months and then I go back". Patient declines going to residential treatment facility at this time.   Emotional Assessment Appearance:  Appears stated age Attitude/Demeanor/Rapport:  Complaining Affect (typically observed):  Agitated Orientation:  Oriented to Self, Oriented to Place, Oriented to Situation Alcohol / Substance use:  Alcohol Use, Illicit Drugs Psych involvement (Current and /or in the community):  No (Comment)  Discharge Needs  Concerns to be addressed:  Patient refuses services Readmission within the last 30 days:  Yes Current discharge risk:  Substance Abuse Barriers to Discharge:  No Barriers Identified   Clearance CootsNicole A Bentlie Catanzaro, LCSW 09/14/2015, 10:28 AM

## 2015-09-14 NOTE — Progress Notes (Signed)
IV removed. AVS reviewed at bedside. Prescription provided to pt w/ instructions on how to take. No further questions. CSW Joni Reiningicole provided pt w/ bus pass. Pt d/c. Justin Mendaudle, Richards Pherigo H, RN

## 2015-09-14 NOTE — Progress Notes (Signed)
LCSWA provided patient with bus pass for transport.   Vivi BarrackNicole Harlie Ragle, Theresia MajorsLCSWA, MSW Clinical Social Worker 5E and Psychiatric Service Line [phone] 09/14/2015  11:41 AM

## 2015-09-14 NOTE — Discharge Summary (Signed)
Discharge Summary  Andre LefortDebra Conaway JYN:[id number]:[id number] DOB: 06/29/1960  PCP: PROVIDER NOT IN SYSTEM  Admit date: 09/12/2015 Discharge date: 09/14/2015  Time spent: 25 minutes   Recommendations for Outpatient Follow-up:  1. Patient is leaving AGAINST MEDICAL ADVICE, despite recommendations that she stay. 2. Patient accepted prescription for Cipro 500 mg by mouth twice a day 7 days for presumed urinary infection 3. Advised patient please seek drug counseling  Discharge Diagnoses:  Active Hospital Problems   Diagnosis Date Noted  . Fever, unspecified 09/13/2015  . Leukocytosis 09/13/2015  . Acute encephalopathy 09/13/2015  . Substance abuse 09/12/2015  . Schizoaffective disorder, bipolar type (HCC)   . Hypertension 06/29/2011    Resolved Hospital Problems   Diagnosis Date Noted Date Resolved  No resolved problems to display.    Discharge Condition: Somewhat improved since hospitalization initially   Diet recommendation: Heart healthy   Filed Vitals:   09/14/15 0003 09/14/15 0528  BP: 138/106 127/80  Pulse: 112 107  Temp: 99.5 F (37.5 C) 98.5 F (36.9 C)  Resp: 23 21    History of present illness:  55 year old female with past medical history of schizoaffective disorder, hypertension and cocaine abuse presented to the emergency room on the early morning of 7/1 saying that she is last done drugs on 6/30 (positive urine drug screen for cocaine) and that she did not feel well. Documented fever of 102 in the emergency room. Lab work is noteworthy for white count of 16.7. Blood cultures drawn and patient started on vancomycin and Zosyn. Chest x-ray and urine cultures unrevealing.  Hospital Course:  Active Problems:   Hypertension: Likely made worse in the setting of cocaine abuse.  Cocaine abuse    Schizoaffective disorder, bipolar type [facility](HCC): Patient will continue on her normal doses of respiratory. She at times is acutely agitated requiring Haldol, but she was within her right  mind as of 7/3.    Substance abuse: Unfortunately, I suspect patient will resume drug use. Urged her to seek drug counseling and stop doing drugs. Explained how smoking crack cocaine is hurting her lungs and ability to breathe.    Fever, unspecified with leukocytosis: Really unclear etiology. Patient had documented fevers in the first 24 hours. Initial urine and chest x-ray unrevealing. Troponin negative. Patient given vancomycin and Zosyn initially but without source, stop these antibiotics. Order d-dimer looking for possible clot as source of fever and when this was mildly elevated, ordered a CT angiogram of chest. Unrevealing for any chest findings, however bilateral kidneys noted some inflammation which can be seen with urinary infection. Although urine clean, no other source of infection and I was concerned about patient leaving AMA and coming back septic, so discharge patient on 7 days of by mouth Cipro twice a day    Acute encephalopathy: Unclear etiology. Do not think that this is from necessarily infection causing this and maybe possibly from drugs leaving her system. Head CT unremarkable for any CVA. Patient did perk up somewhat with oxygen following ABG. Ammonia level unrevealing. Salicylate and Tylenol levels normal.  Obesity: Patient meets criteria with BMI greater than 30.  Acute respiratory failure with hypoxia: Patient had drop in O2 sats briefly during initial when he 4 hours hospitalization. ABG noted PO2 of 60 and patient started on 2 L nasal cannula. She says she is breathing fine as a 7/3 when she left AMA. Nothing seen on chest x-ray or CT of chest. Suspect her hypoxia may be more in response to continual smoking of crack  cocaine.  Bacterial vaginosis: Noted positive clue cells in urine. With her confusion, not able to use clindamycin vaginal cream. Started IV Flagyl, however patient decided to leave AGAINST MEDICAL ADVICE and did not want any more Flagyl area  Procedures:  None     Consultations:  None   Discharge Exam: BP 127/80 mmHg  Pulse 107  Temp(Src) 98.5 F (36.9 C) (Oral)  Resp 21  Ht 5\' 7"  (1.702 m)  Wt 90.719 kg (200 lb)  BMI 31.32 kg/m2  SpO2 97%  LMP 01/13/2012  General: Awake and alert 3, no acute distress other than agitated and angry and wanting to leave.  Cardiovascular: Regular rate and rhythm, S1-S2  Respiratory: Clear to auscultation bilaterally   Discharge Instructions You were cared for by a hospitalist during your hospital stay. If you have any questions about your discharge medications or the care you received while you were in the hospital after you are discharged, you can call the unit and asked to speak with the hospitalist on call if the hospitalist that took care of you is not available. Once you are discharged, your primary care physician will handle any further medical issues. Please note that NO REFILLS for any discharge medications will be authorized once you are discharged, as it is imperative that you return to your primary care physician (or establish a relationship with a primary care physician if you do not have one) for your aftercare needs so that they can reassess your need for medications and monitor your lab values.  Discharge Instructions    Diet - low sodium heart healthy    Complete by:  As directed      Increase activity slowly    Complete by:  As directed             Medication List    TAKE these medications        ciprofloxacin 500 MG tablet  Commonly known as:  CIPRO  Take 1 tablet (500 mg total) by mouth 2 (two) times daily.     divalproex 500 MG 24 hr tablet  Commonly known as:  DEPAKOTE ER  Take 1 tablet (500 mg total) by mouth 2 (two) times daily.     hydrochlorothiazide 12.5 MG capsule  Commonly known as:  MICROZIDE  Take 1 capsule (12.5 mg total) by mouth daily.     lisinopril 5 MG tablet  Commonly known as:  PRINIVIL,ZESTRIL  Take 1 tablet (5 mg total) by mouth daily.     potassium  chloride 10 MEQ tablet  Commonly known as:  K-DUR,KLOR-CON  Take 1 tablet (10 mEq total) by mouth daily.     risperiDONE 2 MG tablet  Commonly known as:  RISPERDAL  Take 1 tablet (2 mg total) by mouth 2 (two) times daily.     risperiDONE microspheres 50 MG injection  Commonly known as:  RISPERDAL CONSTA  Inject 50 mg into the muscle every 14 (fourteen) days.       No Known Allergies    The results of significant diagnostics from this hospitalization (including imaging, microbiology, ancillary and laboratory) are listed below for reference.    Significant Diagnostic Studies: Dg Chest 2 View  09/12/2015  CLINICAL DATA:  Chest pain with fever and shortness of Breath EXAM: CHEST  2 VIEW COMPARISON:  05/05/2015 FINDINGS: Cardiac shadow is at the upper limits of normal in size. The lungs are well aerated bilaterally. No focal infiltrate or sizable effusion is seen. No bony abnormality is noted.  IMPRESSION: No active cardiopulmonary disease. Electronically Signed   By: Alcide Clever M.D.   On: 09/12/2015 07:30   Ct Head Wo Contrast  09/13/2015  CLINICAL DATA:  Acute onset of worsening altered mental status. Confusion and lethargy. Initial encounter. EXAM: CT HEAD WITHOUT CONTRAST TECHNIQUE: Contiguous axial images were obtained from the base of the skull through the vertex without intravenous contrast. COMPARISON:  None. FINDINGS: There is no evidence of acute infarction, mass lesion, or intra- or extra-axial hemorrhage on CT. Prominence of the ventricles and sulci suggests mild cortical volume loss. Scattered periventricular and subcortical white matter change likely reflects small vessel ischemic microangiopathy. The brainstem and fourth ventricle are within normal limits. The basal ganglia are unremarkable in appearance. The cerebral hemispheres demonstrate grossly normal gray-white differentiation. No mass effect or midline shift is seen. There is no evidence of fracture; visualized osseous  structures are unremarkable in appearance. The visualized portions of the orbits are within normal limits. The paranasal sinuses and mastoid air cells are well-aerated. No significant soft tissue abnormalities are seen. IMPRESSION: 1. No acute intracranial pathology seen on CT. 2. Mild cortical volume loss and scattered small vessel ischemic microangiopathy. Electronically Signed   By: Roanna Raider M.D.   On: 09/13/2015 17:11   Ct Angio Chest Pe W Or Wo Contrast  09/13/2015  CLINICAL DATA:  Hypoxia.  Elevated D-dimer. EXAM: CT ANGIOGRAPHY CHEST WITH CONTRAST TECHNIQUE: Multidetector CT imaging of the chest was performed using the standard protocol during bolus administration of intravenous contrast. Multiplanar CT image reconstructions and MIPs were obtained to evaluate the vascular anatomy. CONTRAST:  100 cc Isovue 370 IV COMPARISON:  Radiograph yesterday. FINDINGS: Cardiovascular: Patient had difficulty following breath hold instructions resulting and moderate patient motion artifact. There is no central pulmonary embolus. No aortic dissection. Mild aortic tortuosity. No aneurysm. Heart is prominent in size. Mediastinum/Nodes: No adenopathy.  No pericardial fluid. Lungs/Pleura: Moderate diffuse motion artifact. No consolidation to suggest pneumonia. No pulmonary mass. Limited assessment for pulmonary nodules due to motion. No pleural fluid. Upper Abdomen: Suspected perirenal edema, partially included. This is not definitively seen on abdominal CT 08/27/2014 Musculoskeletal: No definite acute abnormality allowing for motion. Review of the MIP images confirms the above findings. IMPRESSION: 1. No evidence for pulmonary embolus allowing for motion. No definite acute intrathoracic abnormality. 2. Perinephric edema about both kidneys is partially included. This is nonspecific, however can be seen in the setting of urinary tract infection. Recommend correlation with urinalysis. Electronically Signed   By: Rubye Oaks M.D.   On: 09/13/2015 22:34   Dg Abd Portable 1v  09/13/2015  CLINICAL DATA:  Fever. EXAM: PORTABLE ABDOMEN - 1 VIEW COMPARISON:  01/29/2008 FINDINGS: Portable supine abdomen at 1331 hours shows no gaseous bowel dilatation to suggest obstruction. No unexpected abdominal pelvic calcification. Visualized bony anatomy is unremarkable. IMPRESSION: Negative. Electronically Signed   By: Kennith Center M.D.   On: 09/13/2015 14:02    Microbiology: Recent Results (from the past 240 hour(s))  Blood Culture (routine x 2)     Status: None (Preliminary result)   Collection Time: 09/12/15  7:50 AM  Result Value Ref Range Status   Specimen Description BLOOD BLOOD LEFT ARM  Final   Special Requests BOTTLES DRAWN AEROBIC AND ANAEROBIC 5 CC  Final   Culture   Final    NO GROWTH 2 DAYS Performed at [facility]    Report Status PENDING  Incomplete  Blood Culture (routine x 2)  Status: None (Preliminary result)   Collection Time: 09/12/15  8:16 AM  Result Value Ref Range Status   Specimen Description BLOOD RIGHT HAND  Final   Special Requests BOTTLES DRAWN AEROBIC AND ANAEROBIC 5 CC  Final   Culture   Final    NO GROWTH 2 DAYS Performed at [facility]    Report Status PENDING  Incomplete  Urine culture     Status: Abnormal   Collection Time: 09/12/15  8:16 AM  Result Value Ref Range Status   Specimen Description URINE, CLEAN CATCH  Final   Special Requests NONE  Final   Culture MULTIPLE SPECIES PRESENT, SUGGEST RECOLLECTION (A)  Final   Report Status 09/13/2015 FINAL  Final  Wet prep, genital     Status: Abnormal   Collection Time: 09/12/15 12:00 PM  Result Value Ref Range Status   Yeast Wet Prep HPF POC NONE SEEN NONE SEEN Final   Trich, Wet Prep NONE SEEN NONE SEEN Final   Clue Cells Wet Prep HPF POC PRESENT (A) NONE SEEN Final   WBC, Wet Prep HPF POC MODERATE (A) NONE SEEN Final   Sperm NONE SEEN  Final     Labs: Basic Metabolic Panel:  Recent Labs Lab  09/12/15 0727 09/13/15 0538 09/14/15 0520  NA 138 132* 137  K 4.2 3.9 3.7  CL 104 98* 102  CO2 26 25 28   GLUCOSE 102* 146* 146*  BUN 12 8 12   CREATININE 0.80 0.70 1.28*  CALCIUM 9.2 8.9 8.7*   Liver Function Tests:  Recent Labs Lab 09/12/15 0727 09/13/15 0538 09/14/15 0520  AST 27 24 17   ALT 31 30 22   ALKPHOS 64 68 55  BILITOT 1.1 0.4 0.6  PROT 7.9 8.2* 7.3  ALBUMIN 4.1 3.8 3.4*   No results for input(s): LIPASE, AMYLASE in the last 168 hours.  Recent Labs Lab 09/13/15 1413  AMMONIA 38*   CBC:  Recent Labs Lab 09/12/15 0727 09/13/15 0538 09/14/15 0520  WBC 16.7* 20.3* 13.1*  NEUTROABS 13.2*  --  9.8*  HGB 13.0 13.8 13.7  HCT 38.2 40.7 41.6  MCV 84.9 86.2 87.4  PLT 249 234 228   Cardiac Enzymes:  Recent Labs Lab 09/13/15 1217  TROPONINI <0.03   BNP: BNP (last 3 results) No results for input(s): BNP in the last 8760 hours.  ProBNP (last 3 results) No results for input(s): PROBNP in the last 8760 hours.  CBG:  Recent Labs Lab 09/13/15 0731 09/14/15 0745  GLUCAP 171* 143*       Signed:  Hollice Espy, MD [facility] 09/14/2015, 1:00 PM

## 2015-09-17 LAB — CULTURE, BLOOD (ROUTINE X 2)
CULTURE: NO GROWTH
Culture: NO GROWTH

## 2015-10-05 ENCOUNTER — Encounter (HOSPITAL_COMMUNITY): Payer: Self-pay | Admitting: Emergency Medicine

## 2015-10-05 ENCOUNTER — Emergency Department (HOSPITAL_COMMUNITY)
Admission: EM | Admit: 2015-10-05 | Discharge: 2015-10-07 | Disposition: A | Discharge status: Other Acute Inpatient Hospital | Payer: Medicare Other | Attending: Psychiatry | Admitting: Psychiatry

## 2015-10-05 DIAGNOSIS — F1010 Alcohol abuse, uncomplicated: Secondary | ICD-10-CM | POA: Diagnosis not present

## 2015-10-05 DIAGNOSIS — F250 Schizoaffective disorder, bipolar type: Secondary | ICD-10-CM | POA: Diagnosis not present

## 2015-10-05 DIAGNOSIS — F319 Bipolar disorder, unspecified: Secondary | ICD-10-CM | POA: Insufficient documentation

## 2015-10-05 DIAGNOSIS — F259 Schizoaffective disorder, unspecified: Secondary | ICD-10-CM | POA: Insufficient documentation

## 2015-10-05 DIAGNOSIS — M1990 Unspecified osteoarthritis, unspecified site: Secondary | ICD-10-CM | POA: Diagnosis not present

## 2015-10-05 DIAGNOSIS — F1410 Cocaine abuse, uncomplicated: Secondary | ICD-10-CM | POA: Diagnosis not present

## 2015-10-05 DIAGNOSIS — F1910 Other psychoactive substance abuse, uncomplicated: Secondary | ICD-10-CM | POA: Diagnosis present

## 2015-10-05 DIAGNOSIS — F17210 Nicotine dependence, cigarettes, uncomplicated: Secondary | ICD-10-CM | POA: Insufficient documentation

## 2015-10-05 DIAGNOSIS — Z79899 Other long term (current) drug therapy: Secondary | ICD-10-CM | POA: Diagnosis not present

## 2015-10-05 DIAGNOSIS — F3170 Bipolar disorder, currently in remission, most recent episode unspecified: Secondary | ICD-10-CM

## 2015-10-05 LAB — CBC WITH DIFFERENTIAL/PLATELET
Basophils Absolute: 0 10*3/uL (ref 0.0–0.1)
Basophils Relative: 1 %
Eosinophils Absolute: 0.1 10*3/uL (ref 0.0–0.7)
Eosinophils Relative: 1 %
HCT: 38.1 % (ref 36.0–46.0)
Hemoglobin: 12.1 g/dL (ref 12.0–15.0)
Lymphocytes Relative: 39 %
Lymphs Abs: 2.1 10*3/uL (ref 0.7–4.0)
MCH: 29 pg (ref 26.0–34.0)
MCHC: 31.8 g/dL (ref 30.0–36.0)
MCV: 91.4 fL (ref 78.0–100.0)
Monocytes Absolute: 0.4 10*3/uL (ref 0.1–1.0)
Monocytes Relative: 8 %
Neutro Abs: 2.6 10*3/uL (ref 1.7–7.7)
Neutrophils Relative %: 51 %
Platelets: 247 10*3/uL (ref 150–400)
RBC: 4.17 MIL/uL (ref 3.87–5.11)
RDW: 15.2 % (ref 11.5–15.5)
WBC: 5.2 10*3/uL (ref 4.0–10.5)

## 2015-10-05 LAB — COMPREHENSIVE METABOLIC PANEL
ALT: 13 U/L — ABNORMAL LOW (ref 14–54)
AST: 15 U/L (ref 15–41)
Albumin: 3.6 g/dL (ref 3.5–5.0)
Alkaline Phosphatase: 49 U/L (ref 38–126)
Anion gap: 6 (ref 5–15)
BUN: 13 mg/dL (ref 6–20)
CO2: 29 mmol/L (ref 22–32)
Calcium: 9 mg/dL (ref 8.9–10.3)
Chloride: 105 mmol/L (ref 101–111)
Creatinine, Ser: 0.77 mg/dL (ref 0.44–1.00)
GFR calc Af Amer: >60 mL/min (ref >60)
GFR calc non Af Amer: >60 mL/min (ref >60)
Glucose, Bld: 103 mg/dL — ABNORMAL HIGH (ref 65–99)
Potassium: 4.1 mmol/L (ref 3.5–5.1)
Sodium: 140 mmol/L (ref 135–145)
Total Bilirubin: 0.4 mg/dL (ref 0.3–1.2)
Total Protein: 7.3 g/dL (ref 6.5–8.1)

## 2015-10-05 LAB — ETHANOL: Alcohol, Ethyl (B): <5 mg/dL (ref <5)

## 2015-10-05 LAB — RAPID URINE DRUG SCREEN, HOSP PERFORMED
Amphetamines: NOT DETECTED
Barbiturates: NOT DETECTED
Benzodiazepines: NOT DETECTED
Cocaine: POSITIVE — AB
Opiates: NOT DETECTED
Tetrahydrocannabinol: NOT DETECTED
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
## 2015-10-05 NOTE — BH Assessment (Signed)
Assessment Note  [name] is an 55 y.o. female that presents this date with thoughts of self harm (no plan) and passive H/I to harm anyone that "looks at her wrong." Patient is anxious on presentation and seems to be impaired. Patient does admitt to daily cocaine (up to 2 grams) reporting last use on 10/05/15 prior to admission. Patient states she also consumes 5 to 10 12 oz beers daily or "about anything I can get." Patient stated she consumed "some beers" prior to her admission this date. Patient is very disorganized but can be redirected stating she has to "get off all this sh*t." Patient reports ongoing drug use for as "long as she can remember" and has recently been to court to address issues associated with her guardian Jeni Salles [phone]). Patient reports she has had "multiple" guardians and has recently been assigned to Ms. Weeks. Patient stated she presented impaired to court at which time the judge stated "she needed help." Patient has an ACT team "[facility]" who manages her medications. Patient stated she takes IM Risperdal every two weeks but cannot remember when she had her medication last. Patient did state she has not been on her PO medications for over one month. Patient presents with a very anxious affect and stated she D/Ced her medications because "they quit working." Patient cannot remember when she saw her ACT team last due to patient not being at her residence for "some time." Patient also stated she sees a therapist weekly from [facility] but has not seen them "in a minute." Patient states she has S/I with a plan to "blow my brains out I guess." Patient states she does not have access to a firearm. Patient is requesting a voluntary admission to stabilize. Patient admits to ongoing AVH stating "I hear voices all the time that tell me to hurt people" and "see black demon shadows." Patient is oriented to time/place. Patient has had multiple admissions with [facility] with her last one  on 08/09/15 for S/I, Hi and SA issues. Admission note stated: "Patient was brought into the ED because of wanting cocaine detox and non-compliant with medications.  Patient denies SI and self-injurous behaviors.  Patient reports homicidal ideations towards "Anyone who wont help me".  Patient reports auditory hallucinations with command to hurt others "murder". Patient reports drinking 5 more beers daily and crack cocaine.  Patient reports her roommate recently died and she has thoughts of relapse when forced to go into there home. Patient has a DSS guardian Jeni Salles and has ACTT with [facility]. Case was staffed with Shaune Pollack DNP who recommended patient be re-evaluated in the a.m.    Diagnosis: Schizoaffective Disorder    Past Medical History:  Past Medical History:  Diagnosis Date  . Arthritis   . Bipolar 1 disorder (HCC)   . Obesity   . Psychiatric illness   . Schizo-affective schizophrenia (HCC)   . Seizures (HCC)   . Substance abuse     Past Surgical History:  Procedure Laterality Date  . STOMACH SURGERY      Family History:  Family History  Problem Relation Age of Onset  . Alzheimer's disease Mother   . Depression Mother     Social History:  reports that she has been smoking Cigarettes.  She has been smoking about 1.00 pack per day. She has never used smokeless tobacco. She reports that she drinks about 3.6 oz of alcohol per week . She reports that she uses drugs, including "Crack" cocaine  and Cocaine, about 5 times per week.  Additional Social History:  Alcohol / Drug Use Pain Medications: See MAR Prescriptions: See MAR Over the Counter: See MAR History of alcohol / drug use?: Yes Longest period of sobriety (when/how long): 1 year Withdrawal Symptoms: Agitation, Tremors Substance #1 Name of Substance 1: Alcohol 1 - Age of First Use: 19 1 - Amount (size/oz): 5 or more beers, liqour, wine 1 - Frequency: daily 1 - Duration: ongoing 1 - Last Use / Amount:  10/04/15 pt stated she consumede 4 12oz beers Substance #2 Name of Substance 2: Cocaine 2 - Age of First Use: 55 yrs old  2 - Amount (size/oz): varies 2 - Frequency: daily 2 - Duration: ongoing 2 - Last Use / Amount: 10/04/15 1 gram  CIWA: CIWA-Ar BP: (!) 163/104 Pulse Rate: 105 COWS:    Allergies: No Known Allergies  Home Medications:  (Not in a hospital admission)  OB/GYN Status:  Patient's last menstrual period was 01/13/2012.  General Assessment Data Location of Assessment: [facility] ED TTS Assessment: In system Is this a Tele or Face-to-Face Assessment?: Face-to-Face Is this an Initial Assessment or a Re-assessment for this encounter?: Initial Assessment Marital status: Single Maiden name: na Is patient pregnant?: No Pregnancy Status: No Living Arrangements: Alone Can pt return to current living arrangement?: Yes Admission Status: Voluntary Is patient capable of signing voluntary admission?: Yes Referral Source: Self/Family/Friend Insurance type: Medicaid  Medical Screening Exam [facility] Walk-in ONLY) Medical Exam completed: Yes  Crisis Care Plan Living Arrangements: Alone Legal Guardian: Other: Name of Psychiatrist: [facility] (DSS Kendra Opitz [phone]) Name of Therapist: [facility] ([facility] )  Education Status Is patient currently in school?: No Current Grade: NA (na) Highest grade of school patient has completed: NA Name of school: NA Contact person: NA  Risk to self with the past 6 months Suicidal Ideation: Yes-Currently Present Has patient been a risk to self within the past 6 months prior to admission? : No Suicidal Intent: Yes-Currently Present Has patient had any suicidal intent within the past 6 months prior to admission? : No Is patient at risk for suicide?: Yes (no plan) Suicidal Plan?: Yes-Currently Present Has patient had any suicidal plan within the past 6 months prior to admission? : No Specify Current Suicidal Plan: pt  stated she would shoot herself (pt stated she would shoot herself) Access to Means: No What has been your use of drugs/alcohol within the last 12 months?: Current use Previous Attempts/Gestures: Yes How many times?:  (pt stated many times) Other Self Harm Risks: None Triggers for Past Attempts: None known Intentional Self Injurious Behavior: None Family Suicide History: Unknown Recent stressful life event(s): Financial Problems Persecutory voices/beliefs?: No Depression: Yes Depression Symptoms: Loss of interest in usual pleasures, Feeling worthless/self pity Substance abuse history and/or treatment for substance abuse?: Yes Suicide prevention information given to non-admitted patients: Not applicable  Risk to Others within the past 6 months Homicidal Ideation: Yes-Currently Present Does patient have any lifetime risk of violence toward others beyond the six months prior to admission? : No Thoughts of Harm to Others: Yes-Currently Present Comment - Thoughts of Harm to Others:  (passive no plan just feels she will hurt people) Current Homicidal Intent: No Current Homicidal Plan: No Access to Homicidal Means: No Identified Victim:  (na) History of harm to others?: No Assessment of Violence: None Noted Violent Behavior Description:  (na) Does patient have access to weapons?: No Criminal Charges Pending?: No Does patient  have a court date: No Is patient on probation?: No  Psychosis Hallucinations: Auditory, Visual Delusions: None noted  Mental Status Report Appearance/Hygiene: Disheveled Eye Contact: Fair Motor Activity: Agitation Speech: Rapid, Pressured Level of Consciousness: Restless Mood: Anxious Affect: Anxious Anxiety Level: Moderate Thought Processes: Irrelevant Judgement: Impaired Orientation: Person, Place, Time Obsessive Compulsive Thoughts/Behaviors: None  Cognitive Functioning Concentration: Decreased Memory: Recent Impaired IQ: Average Insight:  Fair Impulse Control: Fair Appetite: Fair Weight Loss: 0 Weight Gain: 0 Sleep: Decreased Total Hours of Sleep: 4 Vegetative Symptoms: None  ADLScreening [facility] Assessment Services) Patient's cognitive ability adequate to safely complete daily activities?: Yes Patient able to express need for assistance with ADLs?: Yes Independently performs ADLs?: Yes (appropriate for developmental age)  Prior Inpatient Therapy Prior Inpatient Therapy: Yes Prior Therapy Dates: multiple (multiple) Prior Therapy Facilty/Provider(s):  (pt cannot remember) Reason for Treatment: SA use, S/I  Prior Outpatient Therapy Prior Outpatient Therapy: Yes Prior Therapy Dates: 2017 Prior Therapy Facilty/Provider(s): [facility] Reason for Treatment: Sa use, MH issues Does patient have an ACCT team?: Yes Does patient have Intensive In-House Services?  : Yes Does patient have [facility] services? : No Does patient have P4CC services?: No  ADL Screening (condition at time of admission) Patient's cognitive ability adequate to safely complete daily activities?: Yes Is the patient deaf or have difficulty hearing?: No Does the patient have difficulty seeing, even when wearing glasses/contacts?: No Does the patient have difficulty concentrating, remembering, or making decisions?: Yes Patient able to express need for assistance with ADLs?: Yes Does the patient have difficulty dressing or bathing?: No Independently performs ADLs?: Yes (appropriate for developmental age) Does the patient have difficulty walking or climbing stairs?: No Weakness of Legs: None Weakness of Arms/Hands: None  Home Assistive Devices/Equipment Home Assistive Devices/Equipment: None  Therapy Consults (therapy consults require a physician order) PT Evaluation Needed: No OT Evalulation Needed: No SLP Evaluation Needed: No Abuse/Neglect Assessment (Assessment to be complete while patient is alone) Physical Abuse: Denies Verbal Abuse:  Denies Sexual Abuse: Denies Exploitation of patient/patient's resources: Denies Self-Neglect: Denies Values / Beliefs Cultural Requests During Hospitalization: None Spiritual Requests During Hospitalization: None Consults Spiritual Care Consult Needed: No Social Work Consult Needed: No Merchant navy officer (For Healthcare) Does patient have an advance directive?: No Would patient like information on creating an advanced directive?: No - patient declined information (pt declines)    Additional Information 1:1 In Past 12 Months?: No CIRT Risk: No Elopement Risk: No Does patient have medical clearance?: Yes     Disposition: Case was staffed with Shaune Pollack DNP who recommended patient be re-evaluated in the a.m.  Disposition Initial Assessment Completed for this Encounter: Yes Disposition of Patient: Inpatient treatment program Type of inpatient treatment program: Adult  On Site Evaluation by:   Reviewed with Physician:    Alfredia Ferguson 10/05/2015 12:04 PM

## 2015-10-05 NOTE — ED Notes (Signed)
Pt resting at present, no distress noted, calm & cooperative. Awake, alert & responsive,  Monitoring for safety, Q 15 min checks in effect.

## 2015-10-05 NOTE — ED Notes (Signed)
Pt oriented to room and unit.    Pt contracts for safety.  She is alert and oriented.  States she is having audio hallucinations but does not elaborate.  She was given lunch and a coffee and continues to ask for snacks and soda.  Pt was informed she had to wait until snack time.  15 minute checks and video monitoring in place.

## 2015-10-05 NOTE — ED Notes (Signed)
Report given to Eddie in [place].  patient going to room 35

## 2015-10-05 NOTE — BH Assessment (Signed)
[facility] Assessment Progress Note  This Clinical research associate consulted with Catha Nottingham NP, it is recommended to re-evaluate in the AM for safety. ?

## 2015-10-06 DIAGNOSIS — F250 Schizoaffective disorder, bipolar type: Secondary | ICD-10-CM | POA: Diagnosis not present

## 2015-10-06 DIAGNOSIS — F1410 Cocaine abuse, uncomplicated: Secondary | ICD-10-CM | POA: Diagnosis not present

## 2015-10-06 MED ORDER — BENZTROPINE MESYLATE 1 MG PO TABS
0.5000 mg | ORAL_TABLET | Freq: Two times a day (BID) | ORAL | Status: DC
  Administered 2015-10-06 (×2): 0.5 mg via ORAL
  Filled 2015-10-06 (×2): qty 1

## 2015-10-06 MED ORDER — DIVALPROEX SODIUM ER 500 MG PO TB24
500.0000 mg | ORAL_TABLET | Freq: Two times a day (BID) | ORAL | Status: DC
  Administered 2015-10-06 (×2): 500 mg via ORAL
  Filled 2015-10-06 (×2): qty 1

## 2015-10-06 MED ORDER — RISPERIDONE MICROSPHERES 50 MG IM SUSR
50.0000 mg | INTRAMUSCULAR | Status: DC
  Administered 2015-10-06: 50 mg via INTRAMUSCULAR
  Filled 2015-10-06: qty 2

## 2015-10-06 MED ORDER — DIPHENHYDRAMINE HCL 50 MG/ML IJ SOLN
50.0000 mg | Freq: Once | INTRAMUSCULAR | Status: AC
  Administered 2015-10-06: 50 mg via INTRAMUSCULAR
  Filled 2015-10-06: qty 1

## 2015-10-06 MED ORDER — OLANZAPINE 10 MG PO TBDP
10.0000 mg | ORAL_TABLET | Freq: Three times a day (TID) | ORAL | Status: DC | PRN
  Administered 2015-10-06 (×2): 10 mg via ORAL
  Filled 2015-10-06 (×2): qty 1

## 2015-10-06 MED ORDER — HYDROCHLOROTHIAZIDE 12.5 MG PO CAPS
12.5000 mg | ORAL_CAPSULE | Freq: Every day | ORAL | Status: DC
  Administered 2015-10-06: 12.5 mg via ORAL
  Filled 2015-10-06: qty 1

## 2015-10-06 MED ORDER — POTASSIUM CHLORIDE CRYS ER 10 MEQ PO TBCR
10.0000 meq | EXTENDED_RELEASE_TABLET | Freq: Every day | ORAL | Status: DC
  Administered 2015-10-06: 10 meq via ORAL
  Filled 2015-10-06: qty 1

## 2015-10-06 MED ORDER — RISPERIDONE MICROSPHERES 50 MG IM SUSR: 50.0000 mg | INTRAMUSCULAR | Status: DC

## 2015-10-06 MED ORDER — LISINOPRIL 5 MG PO TABS
5.0000 mg | ORAL_TABLET | Freq: Every day | ORAL | Status: DC
  Administered 2015-10-06: 5 mg via ORAL
  Filled 2015-10-06: qty 1

## 2015-10-06 MED ORDER — RISPERIDONE 2 MG PO TABS
2.0000 mg | ORAL_TABLET | Freq: Two times a day (BID) | ORAL | Status: DC
  Administered 2015-10-06 (×2): 2 mg via ORAL
  Filled 2015-10-06 (×2): qty 1

## 2015-10-06 NOTE — ED Notes (Signed)
Pt is med compliant.  Alert and oriented.  She is somewhat labile today.  Denies S/I, H/I, and AVH.

## 2015-10-06 NOTE — Progress Notes (Addendum)
CSW spoke with [place] of [facility]. She confirms that pt has been accepted to facility and that she is welcomed to come.  She states pt will need to check in at the [facility] first and then go to the [facility].  CSW made nurse aware.  Accepting: Dr. Otho Perl   Nurse Report Number: [phone], [place] [phone] ED CSW 7/25/201710:34 PM

## 2015-10-06 NOTE — Consult Note (Addendum)
[facility] Consult   Reason for Consult:  Homicidal ideations, cocaine abuse Referring Physician:  EDP Patient Identification: [name] MRN:  [id number] Principal Diagnosis: Schizoaffective disorder, bipolar type [facility]) Diagnosis:   Patient Active Problem List   Diagnosis Date Noted  . Fever, unspecified [R50.9] 09/13/2015  . Leukocytosis [D72.829] 09/13/2015  . Acute encephalopathy [G93.40] 09/13/2015  . Substance abuse [F19.10] 09/12/2015  . Hallucination [R44.3]   . Homicidal ideation [R45.850]   . Schizophrenia, unspecified type ([place]) [F20.9]   . Schizoaffective disorder, bipolar type ([place]) [F25.0]   . Schizoaffective disorder ([place]) [F25.9] 03/29/2013  . Cocaine abuse [F14.10] 06/30/2011  . Non-compliant patient [Z91.19] 06/29/2011  . Hypertension [I10] 06/29/2011  . Alcohol dependence ([place]) [F10.20] 06/28/2011  . Polysubstance dependence ([place]) [F19.20] 04/03/2011  . Polysubstance abuse [F19.10] 06/21/2010    Total Time spent with patient:  45 minutes  Subjective:   [name] is a 55 y.o. female patient does not warrant admission.  HPI:  55 yo female who presented to the ED with homicidal ideations, vague.  Today, she is not homicidal unless "someone gets in my way", no plan and educated on legal issues if she hurts anyone.  [place] is does not have housing but is planning to get some.  Reports drinking every day but negative on admission, positive for crack/cocaine.  Denies suicidal ideations and hallucinations.  Stable for discharge.  Today she is seen, she is irritable and began to shout out profanity.  "I know you! (addressing Dr Darleene Cleaver), you never do anything for me?  You don't admit me to behavioral health, they only allow white people there, they don't like niggers! "  Then almost immediately, patient is calmer and smiles and states, I just need my injection. Please.  It helps me a lot."  Past Psychiatric History: schizoaffective disorder,  substance abuse  Risk to Self: Suicidal Ideation: Yes-Currently Present Suicidal Intent: Yes-Currently Present Is patient at risk for suicide?: Yes (no plan) Suicidal Plan?: Yes-Currently Present Specify Current Suicidal Plan: pt stated she would shoot herself (pt stated she would shoot herself) Access to Means: No What has been your use of drugs/alcohol within the last 12 months?: Current use How many times?:  (pt stated many times) Other Self Harm Risks: None Triggers for Past Attempts: None known Intentional Self Injurious Behavior: None Risk to Others: Homicidal Ideation: Yes-Currently Present Thoughts of Harm to Others: Yes-Currently Present Comment - Thoughts of Harm to Others:  (passive no plan just feels she will hurt people) Current Homicidal Intent: No Current Homicidal Plan: No Access to Homicidal Means: No Identified Victim:  (na) History of harm to others?: No Assessment of Violence: None Noted Violent Behavior Description:  (na) Does patient have access to weapons?: No Criminal Charges Pending?: No Does patient have a court date: No Prior Inpatient Therapy: Prior Inpatient Therapy: Yes Prior Therapy Dates: multiple (multiple) Prior Therapy Facilty/Provider(s):  (pt cannot remember) Reason for Treatment: SA use, S/I Prior Outpatient Therapy: Prior Outpatient Therapy: Yes Prior Therapy Dates: 2017 Prior Therapy Facilty/Provider(s): [facility] Reason for Treatment: Sa use, MH issues Does patient have an ACCT team?: Yes Does patient have Intensive In-House Services?  : Yes Does patient have [facility] services? : No Does patient have P4CC services?: No  Past Medical History:  Past Medical History:  Diagnosis Date  . Arthritis   . Bipolar 1 disorder ([place])   . Obesity   . Psychiatric illness   . Schizo-affective schizophrenia ([place])   . Seizures (  [place])   . Substance abuse     Past Surgical History:  Procedure Laterality Date  . STOMACH SURGERY      Family History:  Family History  Problem Relation Age of Onset  . Alzheimer's disease Mother   . Depression Mother    Family Psychiatric  History: none Social History:  History  Alcohol Use  . 3.6 oz/week  . 6 Cans of beer per week     History  Drug Use  . Frequency: 5.0 times per week  . Types: "Crack" cocaine, Cocaine    Comment: crack/cocaine    Social History   Social History  . Marital status: Single    Spouse name: N/A  . Number of children: N/A  . Years of education: N/A   Social History Main Topics  . Smoking status: Current Every Day Smoker    Packs/day: 1.00    Types: Cigarettes  . Smokeless tobacco: Never Used  . Alcohol use 3.6 oz/week    6 Cans of beer per week  . Drug use:     Frequency: 5.0 times per week    Types: "Crack" cocaine, Cocaine     Comment: crack/cocaine  . Sexual activity: No   Other Topics Concern  . None   Social History Narrative  . None   Additional Social History:    Allergies:  No Known Allergies  Labs:  Results for orders placed or performed during the hospital encounter of 10/05/15 (from the past 48 hour(s))  Comprehensive metabolic panel     Status: Abnormal   Collection Time: 10/05/15 10:34 AM  Result Value Ref Range   Sodium 140 135 - 145 mmol/L   Potassium 4.1 3.5 - 5.1 mmol/L   Chloride 105 101 - 111 mmol/L   CO2 29 22 - 32 mmol/L   Glucose, Bld 103 (H) 65 - 99 mg/dL   BUN 13 6 - 20 mg/dL   Creatinine, Ser 0.77 0.44 - 1.00 mg/dL   Calcium 9.0 8.9 - 10.3 mg/dL   Total Protein 7.3 6.5 - 8.1 g/dL   Albumin 3.6 3.5 - 5.0 g/dL   AST 15 15 - 41 U/L   ALT 13 (L) 14 - 54 U/L   Alkaline Phosphatase 49 38 - 126 U/L   Total Bilirubin 0.4 0.3 - 1.2 mg/dL   GFR calc non Af Amer >60 >60 mL/min   GFR calc Af Amer >60 >60 mL/min    Comment: (NOTE) The eGFR has been calculated using the CKD EPI equation. This calculation has not been validated in all clinical situations. eGFR's persistently <60 mL/min signify  possible Chronic Kidney Disease.    Anion gap 6 5 - 15  Ethanol     Status: None   Collection Time: 10/05/15 10:34 AM  Result Value Ref Range   Alcohol, Ethyl (B) <5 <5 mg/dL    Comment:        LOWEST DETECTABLE LIMIT FOR SERUM ALCOHOL IS 5 mg/dL FOR MEDICAL PURPOSES ONLY   CBC with Diff     Status: None   Collection Time: 10/05/15 10:34 AM  Result Value Ref Range   WBC 5.2 4.0 - 10.5 K/uL   RBC 4.17 3.87 - 5.11 MIL/uL   Hemoglobin 12.1 12.0 - 15.0 g/dL   HCT 38.1 36.0 - 46.0 %   MCV 91.4 78.0 - 100.0 fL   MCH 29.0 26.0 - 34.0 pg   MCHC 31.8 30.0 - 36.0 g/dL   RDW 15.2 11.5 - 15.5 %  Platelets 247 150 - 400 K/uL   Neutrophils Relative % 51 %   Neutro Abs 2.6 1.7 - 7.7 K/uL   Lymphocytes Relative 39 %   Lymphs Abs 2.1 0.7 - 4.0 K/uL   Monocytes Relative 8 %   Monocytes Absolute 0.4 0.1 - 1.0 K/uL   Eosinophils Relative 1 %   Eosinophils Absolute 0.1 0.0 - 0.7 K/uL   Basophils Relative 1 %   Basophils Absolute 0.0 0.0 - 0.1 K/uL  Urine rapid drug screen (hosp performed)not at [facility]     Status: Abnormal   Collection Time: 10/05/15 12:00 PM  Result Value Ref Range   Opiates NONE DETECTED NONE DETECTED   Cocaine POSITIVE (A) NONE DETECTED   Benzodiazepines NONE DETECTED NONE DETECTED   Amphetamines NONE DETECTED NONE DETECTED   Tetrahydrocannabinol NONE DETECTED NONE DETECTED   Barbiturates NONE DETECTED NONE DETECTED    Comment:        DRUG SCREEN FOR MEDICAL PURPOSES ONLY.  IF CONFIRMATION IS NEEDED FOR ANY PURPOSE, NOTIFY LAB WITHIN 5 DAYS.        LOWEST DETECTABLE LIMITS FOR URINE DRUG SCREEN Drug Class       Cutoff (ng/mL) Amphetamine      1000 Barbiturate      200 Benzodiazepine   623 Tricyclics       762 Opiates          300 Cocaine          300 THC              50     Current Facility-Administered Medications  Medication Dose Route Frequency Provider Last Rate Last Dose  . benztropine (COGENTIN) tablet 0.5 mg  0.5 mg Oral BID Corena Pilgrim, MD    0.5 mg at 10/06/15 1317  . divalproex (DEPAKOTE ER) 24 hr tablet 500 mg  500 mg Oral BID Corena Pilgrim, MD   500 mg at 10/06/15 1317  . hydrochlorothiazide (MICROZIDE) capsule 12.5 mg  12.5 mg Oral Daily Elayjah Chaney, MD   12.5 mg at 10/06/15 1318  . lisinopril (PRINIVIL,ZESTRIL) tablet 5 mg  5 mg Oral Daily Bradley Handyside, MD   5 mg at 10/06/15 1318  . OLANZapine zydis (ZYPREXA) disintegrating tablet 10 mg  10 mg Oral Q8H PRN Corena Pilgrim, MD   10 mg at 10/06/15 1153  . potassium chloride (K-DUR,KLOR-CON) CR tablet 10 mEq  10 mEq Oral Daily Corena Pilgrim, MD   10 mEq at 10/06/15 1317  . risperiDONE (RISPERDAL) tablet 2 mg  2 mg Oral BID Corena Pilgrim, MD   2 mg at 10/06/15 1317  . risperiDONE microspheres (RISPERDAL CONSTA) injection 50 mg  50 mg Intramuscular Q14 Days Chritopher Coster, MD   50 mg at 10/06/15 1320   Current Outpatient Prescriptions  Medication Sig Dispense Refill  . divalproex (DEPAKOTE ER) 500 MG 24 hr tablet Take 1 tablet (500 mg total) by mouth 2 (two) times daily. (Patient taking differently: Take 1,000 mg by mouth 2 (two) times daily. )    . hydrochlorothiazide (MICROZIDE) 12.5 MG capsule Take 1 capsule (12.5 mg total) by mouth daily. 30 capsule 0  . lisinopril (PRINIVIL,ZESTRIL) 5 MG tablet Take 1 tablet (5 mg total) by mouth daily. 30 tablet 0  . potassium chloride (K-DUR,KLOR-CON) 10 MEQ tablet Take 1 tablet (10 mEq total) by mouth daily. 30 tablet 0  . risperiDONE (RISPERDAL) 2 MG tablet Take 1 tablet (2 mg total) by mouth 2 (two) times daily. 60 tablet 0  .  risperiDONE microspheres (RISPERDAL CONSTA) 50 MG injection Inject 50 mg into the muscle every 14 (fourteen) days.    . ciprofloxacin (CIPRO) 500 MG tablet Take 1 tablet (500 mg total) by mouth 2 (two) times daily. 14 tablet 0    Musculoskeletal: Strength & Muscle Tone: within normal limits Gait & Station: normal Patient leans: N/A  Psychiatric Specialty Exam: Physical Exam  Vitals  reviewed. Constitutional: She is oriented to person, place, and time. She appears well-developed and well-nourished.  HENT:  Head: Normocephalic.  Neck: Normal range of motion.  Respiratory: Effort normal.  Musculoskeletal: Normal range of motion.  Neurological: She is alert and oriented to person, place, and time.  Skin: Skin is warm and dry.  Psychiatric: Her speech is normal. Judgment normal. Her mood appears anxious. Her affect is angry, blunt and labile. She is agitated and aggressive. Thought content is paranoid. Cognition and memory are normal. She exhibits a depressed mood.    Review of Systems  Constitutional: Negative.   HENT: Negative.   Eyes: Negative.   Respiratory: Negative.   Cardiovascular: Negative.   Gastrointestinal: Negative.   Genitourinary: Negative.   Musculoskeletal: Negative.   Skin: Negative.   Neurological: Negative.   Endo/Heme/Allergies: Negative.   Psychiatric/Behavioral: Positive for substance abuse.    Blood pressure 131/100, pulse 115, temperature 99.4 F (37.4 C), temperature source Oral, resp. rate 18, last menstrual period 01/13/2012, SpO2 93 %.There is no height or weight on file to calculate BMI.  General Appearance: Casual  Eye Contact:  Good  Speech:  Normal Rate  Volume:  Normal  Mood:  Euthymic  Affect:  Congruent  Thought Process:  Coherent  Orientation:  Full (Time, Place, and Person)  Thought Content:  WDL  Suicidal Thoughts:  No  Homicidal Thoughts:  No  Memory:  Immediate;   Good Recent;   Good Remote;   Good  Judgement:  Fair  Insight:  Fair  Psychomotor Activity:  Normal  Concentration:  Concentration: Good and Attention Span: Good  Recall:  Good  Fund of Knowledge:  Good  Language:  Good  Akathisia:  No  Handed:  Right  AIMS (if indicated):     Assets:  Leisure Time Physical Health Resilience  ADL's:  Intact  Cognition:  WNL  Sleep:  poor   Treatment Plan Summary: Daily contact with patient to assess and  evaluate symptoms and progress in treatment, Medication management and Plan schizoaffective disorder, bipolar type:  -Crisis stabilization -Medication management:  Continue medical medications and Depakote 1000 mg BID for mood, Risperdal 2 mg BID for psychosis, and Ativan 1 mg every 8 hours PRN anxiety.  Start HCTZ 12.5 mg daily for HTN, Lisinopril 5 mg daily for HTN, and Potassium 10 mEq daily to prevent hypokalemia from the HCTZ -Give Risperdal Consta IM injection today -seek placement, inpatient treatment -Individual and substance abuse counseling  Disposition: No evidence of imminent risk to self or others at present.    Janett Labella, NP [facility] 10/06/2015 3:26 PM Patient seen face-to-face for psychiatric evaluation, chart reviewed and case discussed with the physician extender and developed treatment plan. Reviewed the information documented and agree with the treatment plan. Corena Pilgrim, MD

## 2015-10-06 NOTE — ED Provider Notes (Signed)
Patient has been accepted at [facility] by Dr. Robet Leu.    Dione Booze, MD 10/06/15 [phone]

## 2015-10-06 NOTE — Progress Notes (Signed)
CSW spoke with Thornell Sartorius of [facility], she states that pt is welcomed to come to their facility. However, she states that she must speak with guardian. CSW will reach out to guardian in order to try and to get consent for inpatient.  Trish Mage [phone] ED CSW 7/25/20177:25 PM

## 2015-10-06 NOTE — Progress Notes (Signed)
CSW spoke with amy/on call DSS. She states that she will reach out to the appropriate staff so they can get in touch with [facility] to provide approval.  Crista Curb, LCSWA [phone] ED CSW 7/25/20177:40 PM

## 2015-10-06 NOTE — Progress Notes (Signed)
CSW reached out to after hours. DSS worker, who state that they will have on-call contact CSW back.  Trish Mage [phone] ED CSW 7/25/20177:29 PM

## 2015-10-06 NOTE — ED Notes (Signed)
Report called to RN Dorene Grebe, [facility].  [facility].

## 2015-10-06 NOTE — ED Notes (Signed)
[facility] transport requested. 

## 2015-10-06 NOTE — ED Notes (Signed)
Pt resting on stretcher at present.  No distress noted, calm at present.  Monitoring for safety, Q 15 min checks in effect. Denies SI or HI at present.

## 2015-12-26 ENCOUNTER — Encounter (HOSPITAL_COMMUNITY): Payer: Self-pay | Admitting: *Deleted

## 2015-12-26 ENCOUNTER — Emergency Department (HOSPITAL_COMMUNITY)
Admission: EM | Admit: 2015-12-26 | Discharge: 2015-12-27 | Disposition: A | Discharge status: Other Acute Inpatient Hospital | Payer: Medicare Other | Attending: Emergency Medicine | Admitting: Emergency Medicine

## 2015-12-26 DIAGNOSIS — F17210 Nicotine dependence, cigarettes, uncomplicated: Secondary | ICD-10-CM | POA: Diagnosis not present

## 2015-12-26 DIAGNOSIS — F1910 Other psychoactive substance abuse, uncomplicated: Secondary | ICD-10-CM | POA: Diagnosis not present

## 2015-12-26 DIAGNOSIS — F250 Schizoaffective disorder, bipolar type: Secondary | ICD-10-CM | POA: Diagnosis present

## 2015-12-26 DIAGNOSIS — I10 Essential (primary) hypertension: Secondary | ICD-10-CM | POA: Insufficient documentation

## 2015-12-26 DIAGNOSIS — F209 Schizophrenia, unspecified: Secondary | ICD-10-CM | POA: Diagnosis not present

## 2015-12-26 DIAGNOSIS — F1920 Other psychoactive substance dependence, uncomplicated: Secondary | ICD-10-CM | POA: Diagnosis present

## 2015-12-26 DIAGNOSIS — Z5181 Encounter for therapeutic drug level monitoring: Secondary | ICD-10-CM | POA: Insufficient documentation

## 2015-12-26 DIAGNOSIS — Z046 Encounter for general psychiatric examination, requested by authority: Secondary | ICD-10-CM | POA: Diagnosis present

## 2015-12-26 DIAGNOSIS — Z818 Family history of other mental and behavioral disorders: Secondary | ICD-10-CM | POA: Diagnosis not present

## 2015-12-26 LAB — CBC WITH DIFFERENTIAL/PLATELET
Basophils Absolute: 0 10*3/uL (ref 0.0–0.1)
Basophils Relative: 1 %
EOS PCT: 2 %
Eosinophils Absolute: 0.1 10*3/uL (ref 0.0–0.7)
HEMATOCRIT: 41.3 % (ref 36.0–46.0)
Hemoglobin: 13.2 g/dL (ref 12.0–15.0)
LYMPHS ABS: 2 10*3/uL (ref 0.7–4.0)
LYMPHS PCT: 37 %
MCH: 28.9 pg (ref 26.0–34.0)
MCHC: 32 g/dL (ref 30.0–36.0)
MCV: 90.4 fL (ref 78.0–100.0)
MONO ABS: 0.4 10*3/uL (ref 0.1–1.0)
Monocytes Relative: 7 %
Neutro Abs: 2.9 10*3/uL (ref 1.7–7.7)
Neutrophils Relative %: 53 %
PLATELETS: 311 10*3/uL (ref 150–400)
RBC: 4.57 MIL/uL (ref 3.87–5.11)
RDW: 15 % (ref 11.5–15.5)
WBC: 5.3 10*3/uL (ref 4.0–10.5)

## 2015-12-26 LAB — BASIC METABOLIC PANEL
Anion gap: 5 (ref 5–15)
BUN: 16 mg/dL (ref 6–20)
CALCIUM: 9.1 mg/dL (ref 8.9–10.3)
CO2: 25 mmol/L (ref 22–32)
Chloride: 108 mmol/L (ref 101–111)
Creatinine, Ser: 0.82 mg/dL (ref 0.44–1.00)
GFR calc Af Amer: >60 mL/min (ref >60)
GLUCOSE: 117 mg/dL — AB (ref 65–99)
POTASSIUM: 4.6 mmol/L (ref 3.5–5.1)
Sodium: 138 mmol/L (ref 135–145)

## 2015-12-26 LAB — RAPID URINE DRUG SCREEN, HOSP PERFORMED
Amphetamines: NOT DETECTED
BARBITURATES: NOT DETECTED
BENZODIAZEPINES: NOT DETECTED
Cocaine: POSITIVE — AB
Opiates: NOT DETECTED
TETRAHYDROCANNABINOL: NOT DETECTED

## 2015-12-26 LAB — ETHANOL: Alcohol, Ethyl (B): <5 mg/dL (ref <5)

## 2015-12-26 MED ORDER — ONDANSETRON HCL 4 MG PO TABS: 4.0000 mg | ORAL_TABLET | Freq: Three times a day (TID) | ORAL | Status: DC | PRN

## 2015-12-26 MED ORDER — NICOTINE 21 MG/24HR TD PT24
21.0000 mg | MEDICATED_PATCH | Freq: Every day | TRANSDERMAL | Status: DC
  Administered 2015-12-27: 21 mg via TRANSDERMAL
  Filled 2015-12-26 (×2): qty 1

## 2015-12-26 MED ORDER — ALUM & MAG HYDROXIDE-SIMETH 200-200-20 MG/5ML PO SUSP: 30.0000 mL | ORAL | Status: DC | PRN

## 2015-12-26 MED ORDER — ZOLPIDEM TARTRATE 5 MG PO TABS
5.0000 mg | ORAL_TABLET | Freq: Every evening | ORAL | Status: DC | PRN
  Administered 2015-12-26: 5 mg via ORAL
  Filled 2015-12-26: qty 1

## 2015-12-26 MED ORDER — LORAZEPAM 1 MG PO TABS: 1.0000 mg | ORAL_TABLET | Freq: Three times a day (TID) | ORAL | Status: DC | PRN

## 2015-12-26 MED ORDER — IBUPROFEN 200 MG PO TABS: 600.0000 mg | ORAL_TABLET | Freq: Three times a day (TID) | ORAL | Status: DC | PRN

## 2015-12-26 MED ORDER — ACETAMINOPHEN 325 MG PO TABS
650.0000 mg | ORAL_TABLET | ORAL | Status: DC | PRN
  Administered 2015-12-27: 650 mg via ORAL

## 2015-12-26 NOTE — ED Provider Notes (Signed)
[facility]-EMERGENCY DEPT Provider Note   CSN: [id number] Arrival date & time: 12/26/15  1338     History   Chief Complaint Chief Complaint  Patient presents with  . Addiction Problem    HPI [name] is a 55 y.o. female.  Level V caveat for psychiatric illness. She was recently in a program [facility] in [place], [place]. She discharged herself 2 weeks ago for uncertain reasons. Now she has been using street drugs again and drinking alcohol. Past medical history includes bipolar illness and schizophrenia. She is concerned about her mental health.      Past Medical History:  Diagnosis Date  . Arthritis   . Bipolar 1 disorder ([facility])   . Obesity   . Psychiatric illness   . Schizo-affective schizophrenia ([facility])   . Seizures ([facility])   . Substance abuse     Patient Active Problem List   Diagnosis Date Noted  . Fever, unspecified 09/13/2015  . Leukocytosis 09/13/2015  . Acute encephalopathy 09/13/2015  . Substance abuse 09/12/2015  . Hallucination   . Homicidal ideation   . Schizophrenia, unspecified type ([facility])   . Schizoaffective disorder, bipolar type ([facility])   . Schizoaffective disorder ([facility]) 03/29/2013  . Cocaine abuse 06/30/2011  . Non-compliant patient 06/29/2011  . Hypertension 06/29/2011  . Alcohol dependence ([facility]) 06/28/2011  . Polysubstance dependence ([facility]) 04/03/2011  . Polysubstance abuse 06/21/2010    Past Surgical History:  Procedure Laterality Date  . STOMACH SURGERY      OB History    No data available       Home Medications    Prior to Admission medications   Medication Sig Start Date End Date Taking? Authorizing Provider  paliperidone (INVEGA SUSTENNA) 156 MG/ML SUSP injection Inject 156 mg into the muscle every 30 (thirty) days.   Yes Historical Provider, MD  ciprofloxacin (CIPRO) 500 MG tablet Take 1 tablet (500 mg total) by mouth 2 (two) times daily. Patient not taking: Reported on 12/26/2015 09/14/15   Hollice Espy, MD    divalproex (DEPAKOTE ER) 500 MG 24 hr tablet Take 1 tablet (500 mg total) by mouth 2 (two) times daily. Patient not taking: Reported on 12/26/2015 04/02/13   Zannie Cove, MD  hydrochlorothiazide (MICROZIDE) 12.5 MG capsule Take 1 capsule (12.5 mg total) by mouth daily. Patient not taking: Reported on 12/26/2015 08/10/15   Charm Rings, NP  lisinopril (PRINIVIL,ZESTRIL) 5 MG tablet Take 1 tablet (5 mg total) by mouth daily. Patient not taking: Reported on 12/26/2015 08/10/15   Charm Rings, NP  potassium chloride (K-DUR,KLOR-CON) 10 MEQ tablet Take 1 tablet (10 mEq total) by mouth daily. Patient not taking: Reported on 12/26/2015 08/10/15   Charm Rings, NP  risperiDONE (RISPERDAL) 2 MG tablet Take 1 tablet (2 mg total) by mouth 2 (two) times daily. Patient not taking: Reported on 12/26/2015 08/10/15   Charm Rings, NP    Family History Family History  Problem Relation Age of Onset  . Alzheimer's disease Mother   . Depression Mother     Social History Social History  Substance Use Topics  . Smoking status: Current Every Day Smoker    Packs/day: 1.00    Types: Cigarettes  . Smokeless tobacco: Never Used  . Alcohol use 3.6 oz/week    6 Cans of beer per week     Allergies   Review of patient's allergies indicates no known allergies.   Review of Systems Review of Systems  Reason unable to perform  ROS: Psychiatric reasons.     Physical Exam Updated Vital Signs BP (!) 157/110 (BP Location: Left Arm) Comment: pt states hx of htn, but not taking meds  Pulse 99   Temp 98.8 F (37.1 C) (Oral)   Resp 22   Wt 222 lb 8 oz (100.9 kg)   LMP 01/13/2012   SpO2 96%   BMI 34.85 kg/m   Physical Exam  Constitutional: She is oriented to person, place, and time.  Disheveled  HENT:  Head: Normocephalic and atraumatic.  Eyes: Conjunctivae are normal.  Neck: Neck supple.  Cardiovascular: Normal rate and regular rhythm.   Pulmonary/Chest: Effort normal and breath sounds  normal.  Abdominal: Soft. Bowel sounds are normal.  Musculoskeletal: Normal range of motion.  Neurological: She is alert and oriented to person, place, and time.  Skin: Skin is warm and dry.  Psychiatric:  Flat affect  Nursing note and vitals reviewed.    ED Treatments / Results  Labs (all labs ordered are listed, but only abnormal results are displayed) Labs Reviewed  BASIC METABOLIC PANEL - Abnormal; Notable for the following:       Result Value   Glucose, Bld 117 (*)    All other components within normal limits  RAPID URINE DRUG SCREEN, HOSP PERFORMED - Abnormal; Notable for the following:    Cocaine POSITIVE (*)    All other components within normal limits  CBC WITH DIFFERENTIAL/PLATELET  ETHANOL    EKG  EKG Interpretation None       Radiology No results found.  Procedures Procedures (including critical care time)  Medications Ordered in ED Medications  alum & mag hydroxide-simeth (MAALOX/MYLANTA) 200-200-20 MG/5ML suspension 30 mL (not administered)  ondansetron (ZOFRAN) tablet 4 mg (not administered)  nicotine (NICODERM CQ - dosed in mg/24 hours) patch 21 mg (not administered)  zolpidem (AMBIEN) tablet 5 mg (not administered)  ibuprofen (ADVIL,MOTRIN) tablet 600 mg (not administered)  acetaminophen (TYLENOL) tablet 650 mg (not administered)  LORazepam (ATIVAN) tablet 1 mg (not administered)     Initial Impression / Assessment and Plan / ED Course  I have reviewed the triage vital signs and the nursing notes.  Pertinent labs & imaging results that were available during my care of the patient were reviewed by me and considered in my medical decision making (see chart for details).  Clinical Course    Patient has known mental health problems. I'm concerned about her safety in the community. Will get behavioral health consult.  Final Clinical Impressions(s) / ED Diagnoses   Final diagnoses:  Polysubstance abuse  Schizophrenia, unspecified type [employer] [facility] [employer](HCC)      New Prescriptions New Prescriptions   No medications on file     Donnetta HutchingBrian Lorana Maffeo, MD 12/26/15 1708

## 2015-12-26 NOTE — ED Triage Notes (Signed)
Patient presents to ED with [employer] (per her request) to get help to detox from EtOH and crack cocaine.  Patient recently got out of a treatment program in [place].  It was a 28 day program, but she left early or unspecified reasons.  Patient denies SI/HI when asked, but states, "I'm going to be dead if I don't get some help."  Patient also states she is fearful of people in her neighborhood because she owes them money.  Patient states she wants to get away from her house because the people she lives with use crack and heroin.  Patient typically drinks alcohol "as much as I can."  She also uses "as much as I can get" of crack daily.  Patient's last use of both was around 5 am today.

## 2015-12-26 NOTE — BH Assessment (Signed)
[facility] Assessment Progress Note  Patient will be re-evaluated in the a.m. Per [place] NP.

## 2015-12-26 NOTE — ED Notes (Signed)
[facility] treatment at a behavior health facility. Pt reports that she lives in a neighbor hood that has drugs including the house where she rents a room. Pt says that she wants help with substance abuse. She reports losing her sister last week and her sister-in-law recently. Pt denies si and hi. She denies hallucinations. 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

## 2015-12-26 NOTE — ED Notes (Signed)
Wand by security.   

## 2015-12-26 NOTE — ED Notes (Signed)
SBAR Report received from previous nurse. Pt received calm and visible on unit. Pt denies current SI/ HI, A/V H, depression, anxiety, and pain at this time, and is otherwise stable. Pt reminded of camera surveillance, q 15 min rounds, and rules of the milieu. Pt screened for contraband by writer, will continue to assess. 

## 2015-12-26 NOTE — BH Assessment (Addendum)
Assessment Note  Mary LefortDebra Matney is an 55 y.o. female that presents this date requesting assistance with SA issues. Patient has passive S/I without plan. Patient denies any H/I or AVH. Patient was recently [place] from [place] residential [place] in [place] but did not complete that program due to patient stating "they would not give her the right medications." Patient reports once she returned to her residence she relapsed on cocaine due to other individuals in her residence that are in active use. Patient reports daily use of cocaine (1 gram ) with last reported use earlier this date with patient reporting they used over 1 gram. Patient also stated they consumed 3 12 oz beers earlier this date. Patient reports withdrawals to include: agitation, tremors and irritability. Patient also reports ongoing depression with symptoms to include: feelings of guilt (over relapsing), racing thoughts and feelings of hopelessness. Patient reports medication non-compliance for the last "few days" stating she has not been in touch with her ACT team [facility]. Patient states she also has a guardian Kendra Opitzicole Weaks who is currently managing her care. Patient states that she suffers from a Bipolar Disorder and Schizophrenia and has had multiple psychiatric hospitalizations in her life. Per record review patient [facility], patient has  multiple admissions (over 5 in 2017). She reports multiple attempts at residential treatment but reports she soon relapses on discharge. Patient is oriented to time/place and is plseant on admission. Pt has been abusing drugs and alcohol since her teens and "just can't stop." Patient denies any history of abuse. Pt denies access to weapons. Per admission note: "Patient presents to ED with [facility] (per her request) to get help to detox from EtOH and crack cocaine. Patient recently got out of a treatment program in [place]. It was a 28 day program, but she left early or  unspecified reasons. Patient denies SI/HI when asked, but states, "I'm going to be dead if I don't get some help." Patient also states she is fearful of people in her neighborhood because she owes them money. Patient states she wants to get away from her house because the people she lives with use crack and heroin". Patient typically drinks alcohol "as much as I can."  She also uses "as much as I can get" of crack daily. Case was staffed with [facility] NP who recommended patient be re-evaluated ion the a.m.  Diagnosis:Schizoaffective disorder, bipolar type Polysubstance abuse severe (per notes0  Past Medical History:  Past Medical History:  Diagnosis Date  . Arthritis   . Bipolar 1 disorder (HCC)   . Obesity   . Psychiatric illness   . Schizo-affective schizophrenia (HCC)   . Seizures (HCC)   . Substance abuse     Past Surgical History:  Procedure Laterality Date  . STOMACH SURGERY      Family History:  Family History  Problem Relation Age of Onset  . Alzheimer's disease Mother   . Depression Mother     Social History:  reports that she has been smoking Cigarettes.  She has been smoking about 1.00 pack per day. She has never used smokeless tobacco. She reports that she drinks about 3.6 oz of alcohol per week . She reports that she uses drugs, including "Crack" cocaine and Cocaine, about 5 times per week.  Additional Social History:  Alcohol / Drug Use Pain Medications: See MAR Prescriptions: See MAR Over the Counter: See MAR History of alcohol / drug use?: Yes Longest period of sobriety (  when/how long): 1 year Negative Consequences of Use: Financial, Legal, Personal relationships, Work / School Withdrawal Symptoms: Agitation, Tremors, Irritability Substance #1 Name of Substance 1: Alcohol 1 - Age of First Use: 18 1 - Amount (size/oz): 12 oz beers 1 - Frequency: daily 1 - Duration: Unknown 1 - Last Use / Amount: 12/26/15 pt stated they cionsumed 4 12 oz beers Substance  #2 Name of Substance 2: Cocaine 2 - Age of First Use: 55 yrs old  2 - Amount (size/oz): varies 2 - Frequency: daily 2 - Duration: ongoing 2 - Last Use / Amount: 12/26/15 cl stated they used 1 gram Substance #3 Name of Substance 3: Nicotene/Cigarettes 3 - Age of First Use: young child 3 - Amount (size/oz): 2 pks 3 - Frequency: daily 3 - Last Use / Amount: today  CIWA: CIWA-Ar BP: 148/78 Pulse Rate: 85 COWS:    Allergies: No Known Allergies  Home Medications:  (Not in a hospital admission)  OB/GYN Status:  Patient's last menstrual period was 01/13/2012.  General Assessment Data Location of Assessment: [facility] ED TTS Assessment: In system Is this a Tele or Face-to-Face Assessment?: Face-to-Face Is this an Initial Assessment or a Re-assessment for this encounter?: Initial Assessment Marital status: Single Maiden name: na Is patient pregnant?: No Pregnancy Status: No Living Arrangements: Other (Comment) (boarding house) Can pt return to current living arrangement?: Yes Admission Status: Voluntary Is patient capable of signing voluntary admission?: Yes Referral Source: Self/Family/Friend Insurance type: Medicaid  Medical Screening Exam [facility] Walk-in ONLY) Medical Exam completed: Yes  Crisis Care Plan Living Arrangements: Other (Comment) (boarding house) Legal Guardian:  Kendra Opitz) Name of Psychiatrist: [facility] Name of Therapist: None  Education Status Is patient currently in school?: No Current Grade: na Highest grade of school patient has completed: 10 Name of school: na Contact person: na  Risk to self with the past 6 months Suicidal Ideation: No Has patient been a risk to self within the past 6 months prior to admission? : No Suicidal Intent: No Has patient had any suicidal intent within the past 6 months prior to admission? : No Is patient at risk for suicide?: No Suicidal Plan?: No Has patient had any suicidal plan within the past 6 months prior  to admission? : No Access to Means: No What has been your use of drugs/alcohol within the last 12 months?: Current use Previous Attempts/Gestures: No How many times?: 0 Other Self Harm Risks: na Triggers for Past Attempts: Unknown Intentional Self Injurious Behavior: None Family Suicide History: No Recent stressful life event(s): Other (Comment) (relapse due to peers in home) Persecutory voices/beliefs?: No Depression: Yes Depression Symptoms: Guilt, Loss of interest in usual pleasures, Feeling worthless/self pity Substance abuse history and/or treatment for substance abuse?: Yes Suicide prevention information given to non-admitted patients: Not applicable  Risk to Others within the past 6 months Homicidal Ideation: No Does patient have any lifetime risk of violence toward others beyond the six months prior to admission? : No Thoughts of Harm to Others: No Current Homicidal Intent: No Current Homicidal Plan: No Access to Homicidal Means: No Identified Victim:  (na) History of harm to others?: No Assessment of Violence: None Noted Violent Behavior Description: na Does patient have access to weapons?: No Criminal Charges Pending?: No Does patient have a court date: No Is patient on probation?: No  Psychosis Hallucinations: None noted Delusions: None noted  Mental Status Report Appearance/Hygiene: In scrubs Eye Contact: Fair Motor Activity: Freedom of movement Speech: Logical/coherent Level  of Consciousness: Alert Mood: Depressed Affect: Appropriate to circumstance Anxiety Level: Minimal Thought Processes: Coherent, Relevant Judgement: Unimpaired Orientation: Person, Place, Time Obsessive Compulsive Thoughts/Behaviors: None  Cognitive Functioning Concentration: Normal Memory: Recent Intact, Remote Intact IQ: Average Insight: Fair Impulse Control: Poor Appetite: Fair Weight Loss: 0 Weight Gain: 0 Sleep: Decreased Total Hours of Sleep: 3 Vegetative Symptoms:  None  ADLScreening [employer] Assessment Services) Patient's cognitive ability adequate to safely complete daily activities?: Yes Patient able to express need for assistance with ADLs?: Yes Independently performs ADLs?: Yes (appropriate for developmental age)  Prior Inpatient Therapy Prior Inpatient Therapy: Yes Prior Therapy Dates: 2017,2016 Prior Therapy Facilty/Provider(s): [facility] Reason for Treatment: SA issues  Prior Outpatient Therapy Prior Outpatient Therapy: Yes Prior Therapy Dates: 2017 Prior Therapy Facilty/Provider(s): [facility] Reason for Treatment: MH issues Does patient have an ACCT team?: Yes Does patient have Intensive In-House Services?  : No Does patient have [facility] services? : No Does patient have P4CC services?: No  ADL Screening (condition at time of admission) Patient's cognitive ability adequate to safely complete daily activities?: Yes Is the patient deaf or have difficulty hearing?: No Does the patient have difficulty seeing, even when wearing glasses/contacts?: No Does the patient have difficulty concentrating, remembering, or making decisions?: No Patient able to express need for assistance with ADLs?: Yes Does the patient have difficulty dressing or bathing?: No Independently performs ADLs?: Yes (appropriate for developmental age) Does the patient have difficulty walking or climbing stairs?: No Weakness of Legs: None Weakness of Arms/Hands: None  Home Assistive Devices/Equipment Home Assistive Devices/Equipment: None  Therapy Consults (therapy consults require a physician order) PT Evaluation Needed: No OT Evalulation Needed: No SLP Evaluation Needed: No Abuse/Neglect Assessment (Assessment to be complete while patient is alone) Physical Abuse: Denies Verbal Abuse: Denies Sexual Abuse: Denies Exploitation of patient/patient's resources: Denies Self-Neglect: Denies Values / Beliefs Spiritual Requests During Hospitalization:  None Consults Spiritual Care Consult Needed: No Social Work Consult Needed: No Merchant navy officer (For Healthcare) Does patient have an advance directive?: No Would patient like information on creating an advanced directive?: No - patient declined information    Additional Information 1:1 In Past 12 Months?: No CIRT Risk: No Elopement Risk: No Does patient have medical clearance?: Yes     Disposition: Case was staffed with [facility] NP who recommended patient be re-evaluated ion the a.m.  Disposition Initial Assessment Completed for this Encounter: Yes Disposition of Patient: Other dispositions Other disposition(s): Other (Comment) (re-evaluate in the a.m.)  On Site Evaluation by:   Reviewed with Physician:    Alfredia Ferguson 12/26/2015 5:59 PM

## 2015-12-27 ENCOUNTER — Encounter (HOSPITAL_COMMUNITY): Payer: Self-pay | Admitting: *Deleted

## 2015-12-27 ENCOUNTER — Encounter (HOSPITAL_COMMUNITY): Payer: Self-pay | Admitting: Registered Nurse

## 2015-12-27 ENCOUNTER — Observation Stay (HOSPITAL_COMMUNITY)
Admission: AD | Admit: 2015-12-27 | Discharge: 2015-12-28 | Disposition: A | Discharge status: Home / Self Care | Payer: Medicare Other | Source: Intra-hospital | Attending: Psychiatry | Admitting: Psychiatry

## 2015-12-27 DIAGNOSIS — F250 Schizoaffective disorder, bipolar type: Secondary | ICD-10-CM | POA: Diagnosis present

## 2015-12-27 DIAGNOSIS — D72829 Elevated white blood cell count, unspecified: Secondary | ICD-10-CM | POA: Diagnosis not present

## 2015-12-27 DIAGNOSIS — Z6834 Body mass index (BMI) 34.0-34.9, adult: Secondary | ICD-10-CM | POA: Diagnosis not present

## 2015-12-27 DIAGNOSIS — Z9119 Patient's noncompliance with other medical treatment and regimen: Secondary | ICD-10-CM | POA: Insufficient documentation

## 2015-12-27 DIAGNOSIS — F17210 Nicotine dependence, cigarettes, uncomplicated: Secondary | ICD-10-CM | POA: Diagnosis not present

## 2015-12-27 DIAGNOSIS — Z820 Family history of epilepsy and other diseases of the nervous system: Secondary | ICD-10-CM | POA: Insufficient documentation

## 2015-12-27 DIAGNOSIS — F1994 Other psychoactive substance use, unspecified with psychoactive substance-induced mood disorder: Secondary | ICD-10-CM | POA: Diagnosis present

## 2015-12-27 DIAGNOSIS — M1990 Unspecified osteoarthritis, unspecified site: Secondary | ICD-10-CM | POA: Diagnosis not present

## 2015-12-27 DIAGNOSIS — R45851 Suicidal ideations: Secondary | ICD-10-CM | POA: Diagnosis not present

## 2015-12-27 DIAGNOSIS — Z79899 Other long term (current) drug therapy: Secondary | ICD-10-CM

## 2015-12-27 DIAGNOSIS — I10 Essential (primary) hypertension: Secondary | ICD-10-CM | POA: Diagnosis not present

## 2015-12-27 DIAGNOSIS — Z818 Family history of other mental and behavioral disorders: Secondary | ICD-10-CM | POA: Diagnosis not present

## 2015-12-27 DIAGNOSIS — G9340 Encephalopathy, unspecified: Secondary | ICD-10-CM | POA: Diagnosis not present

## 2015-12-27 DIAGNOSIS — F1410 Cocaine abuse, uncomplicated: Secondary | ICD-10-CM | POA: Diagnosis not present

## 2015-12-27 DIAGNOSIS — F209 Schizophrenia, unspecified: Secondary | ICD-10-CM

## 2015-12-27 DIAGNOSIS — E669 Obesity, unspecified: Secondary | ICD-10-CM | POA: Insufficient documentation

## 2015-12-27 DIAGNOSIS — Z8489 Family history of other specified conditions: Secondary | ICD-10-CM | POA: Diagnosis not present

## 2015-12-27 DIAGNOSIS — F259 Schizoaffective disorder, unspecified: Secondary | ICD-10-CM | POA: Diagnosis present

## 2015-12-27 DIAGNOSIS — F1920 Other psychoactive substance dependence, uncomplicated: Secondary | ICD-10-CM

## 2015-12-27 DIAGNOSIS — F1020 Alcohol dependence, uncomplicated: Secondary | ICD-10-CM | POA: Diagnosis not present

## 2015-12-27 MED ORDER — BENZTROPINE MESYLATE 0.5 MG PO TABS
0.5000 mg | ORAL_TABLET | Freq: Two times a day (BID) | ORAL | Status: DC
  Administered 2015-12-27: 0.5 mg via ORAL
  Filled 2015-12-27: qty 1

## 2015-12-27 MED ORDER — LORAZEPAM 1 MG PO TABS: 1.0000 mg | ORAL_TABLET | Freq: Three times a day (TID) | ORAL | Status: DC | PRN

## 2015-12-27 MED ORDER — POTASSIUM CHLORIDE CRYS ER 10 MEQ PO TBCR
10.0000 meq | EXTENDED_RELEASE_TABLET | Freq: Every day | ORAL | Status: DC
  Administered 2015-12-27: 10 meq via ORAL
  Filled 2015-12-27: qty 1

## 2015-12-27 MED ORDER — POTASSIUM CHLORIDE CRYS ER 10 MEQ PO TBCR
10.0000 meq | EXTENDED_RELEASE_TABLET | Freq: Every day | ORAL | Status: DC
Start: 2015-12-28 — End: 2015-12-28
  Filled 2015-12-27 (×2): qty 1

## 2015-12-27 MED ORDER — DIVALPROEX SODIUM ER 500 MG PO TB24
500.0000 mg | ORAL_TABLET | Freq: Two times a day (BID) | ORAL | Status: DC
  Administered 2015-12-27: 500 mg via ORAL
  Filled 2015-12-27: qty 1

## 2015-12-27 MED ORDER — DOXYCYCLINE HYCLATE 100 MG PO TABS: 100.0000 mg | ORAL_TABLET | Freq: Two times a day (BID) | ORAL | Status: DC

## 2015-12-27 MED ORDER — RISPERIDONE 2 MG PO TABS
2.0000 mg | ORAL_TABLET | Freq: Two times a day (BID) | ORAL | Status: DC
  Administered 2015-12-27: 2 mg via ORAL
  Filled 2015-12-27: qty 1

## 2015-12-27 MED ORDER — HYDROCHLOROTHIAZIDE 12.5 MG PO CAPS: 12.5000 mg | ORAL_CAPSULE | Freq: Every day | ORAL | Status: DC

## 2015-12-27 MED ORDER — LISINOPRIL 5 MG PO TABS
5.0000 mg | ORAL_TABLET | Freq: Every day | ORAL | Status: DC
  Administered 2015-12-27: 5 mg via ORAL
  Filled 2015-12-27: qty 1

## 2015-12-27 MED ORDER — HYDROCHLOROTHIAZIDE 12.5 MG PO CAPS
12.5000 mg | ORAL_CAPSULE | Freq: Every day | ORAL | Status: DC
  Administered 2015-12-27: 12.5 mg via ORAL
  Filled 2015-12-27: qty 1

## 2015-12-27 MED ORDER — LISINOPRIL 5 MG PO TABS: 5.0000 mg | ORAL_TABLET | Freq: Every day | ORAL | Status: DC

## 2015-12-27 MED ORDER — TRAZODONE HCL 50 MG PO TABS
50.0000 mg | ORAL_TABLET | Freq: Every evening | ORAL | Status: DC | PRN
  Administered 2015-12-27: 50 mg via ORAL
  Filled 2015-12-27: qty 1

## 2015-12-27 MED ORDER — MAGNESIUM HYDROXIDE 400 MG/5ML PO SUSP
30.0000 mL | Freq: Every day | ORAL | Status: DC | PRN
  Administered 2015-12-27: 30 mL via ORAL
  Filled 2015-12-27: qty 30

## 2015-12-27 MED ORDER — NICOTINE 21 MG/24HR TD PT24: 21.0000 mg | MEDICATED_PATCH | Freq: Every day | TRANSDERMAL | Status: DC

## 2015-12-27 NOTE — ED Notes (Signed)
[facility] per MD order. Pt signed for personal property and property given to [facility] for transfer. Pt signed e-signature. Ambulatory off unit.

## 2015-12-27 NOTE — H&P (Signed)
[facility] Provider Admission PAA/H&P  Patient Identification: [name] MRN:  [id number] Date of Evaluation:  12/27/2015 Chief Complaint:  Schizoaffective Disorder - Bipolar Type Polysubstance Abuse, severe Principal Diagnosis: Schizoaffective disorder ([place]) Diagnosis:   Patient Active Problem List   Diagnosis Date Noted  . Substance induced mood disorder ([place]) [F19.94] 12/27/2015  . Fever, unspecified [R50.9] 09/13/2015  . Leukocytosis [D72.829] 09/13/2015  . Acute encephalopathy [G93.40] 09/13/2015  . Substance abuse [F19.10] 09/12/2015  . Hallucination [R44.3]   . Homicidal ideation [R45.850]   . Schizophrenia, unspecified type ([place]) [F20.9]   . Schizoaffective disorder, bipolar type ([place]) [F25.0]   . Schizoaffective disorder ([place]) [F25.9] 03/29/2013  . Cocaine abuse [F14.10] 06/30/2011  . Non-compliant patient [Z91.19] 06/29/2011  . Hypertension [I10] 06/29/2011  . Alcohol dependence ([place]) [F10.20] 06/28/2011  . Polysubstance dependence ([place]) [F19.20] 04/03/2011  . Polysubstance abuse [F19.10] 06/21/2010  . Schizophrenia ([place]) [F20.9] 06/21/2010   History of Present Illness: From Consult Note: Patient presented to [facility] yesterday with complaints of suicidal ideation and wanting assistance with her drug use and getting into a rehab.  Patient states that it is hard for her to quit related to where she lives; states that there is nothing but drugs in the area.  Patient also reports that she was recently in a rehab facility but left early because she didn't like it there.  Patient also states that she can not continue going like she is and that she will kill her self with the drug use.  Patient has had multiple admissions to [facility] and has [facility] team.  At this time patient denies homicidal ideation, psychosis, and paranoia; passive suicidal ideation related to drug use.   On Exam: Patient expresses information as above. Patient is pleasant, her speech is  tangential and pressured. Reports that she would like assistance with getting into a rehab. Reports that she lives with a couple and pays $400 per month rent, but states the neighborhood is bad and contributes to her habit. Reports that she has little family and social support. Reports that her sister and ex mother in law recently died and she went to both funerals "high". Reports that her children all have families, so she tries not to bother them. Denies suicidal ideations, homicidal ideations, and audiovisual hallucinations.   Associated Signs/Symptoms: Depression Symptoms:  depressed mood, hopelessness, (Hypo) Manic Symptoms:  Impulsivity, Anxiety Symptoms:  None Psychotic Symptoms:  Hallucinations: None PTSD Symptoms: Negative Total Time spent with patient: 30 minutes  Past Psychiatric History: Schizoaffective disorder, polysubstance abuse  Is the patient at risk to self? No.  Has the patient been a risk to self in the past 6 months? No.  Has the patient been a risk to self within the distant past? No.  Is the patient a risk to others? No.  Has the patient been a risk to others in the past 6 months? No.  Has the patient been a risk to others within the distant past? No.   Prior Inpatient Therapy:  2017, 2016 Prior Outpatient Therapy:  [facility] team  Alcohol Screening: Patient refused Alcohol Screening Tool: Yes 1. How often do you have a drink containing alcohol?: 4 or more times a week 2. How many drinks containing alcohol do you have on a typical day when you are drinking?: 5 or 6 3. How often do you have six or more drinks on one occasion?: Weekly Preliminary Score: 5 4. How often during the last year have  you found that you were not able to stop drinking once you had started?: Weekly 5. How often during the last year have you failed to do what was normally expected from you becasue of drinking?: Monthly 6. How often during the last year have you needed a first  drink in the morning to get yourself going after a heavy drinking session?: Weekly 7. How often during the last year have you had a feeling of guilt of remorse after drinking?: Daily or almost daily 8. How often during the last year have you been unable to remember what happened the night before because you had been drinking?: Less than monthly 9. Have you or someone else been injured as a result of your drinking?: No 10. Has a relative or friend or a doctor or another health worker been concerned about your drinking or suggested you cut down?: Yes, during the last year Alcohol Use Disorder Identification Test Final Score (AUDIT): 26 Brief Intervention: Yes Substance Abuse History in the last 12 months:  Yes.   Consequences of Substance Abuse: Family Consequences:  No family support Previous Psychotropic Medications: Yes  Psychological Evaluations: Yes  Past Medical History:  Past Medical History:  Diagnosis Date  . Arthritis   . Bipolar 1 disorder ([place])   . Obesity   . Psychiatric illness   . Schizo-affective schizophrenia ([place])   . Seizures ([place])   . Substance abuse     Past Surgical History:  Procedure Laterality Date  . STOMACH SURGERY     Family History:  Family History  Problem Relation Age of Onset  . Alzheimer's disease Mother   . Depression Mother    Family Psychiatric History: Depression, Alzheimer's Tobacco Screening: Have you used any form of tobacco in the last 30 days? (Cigarettes, Smokeless Tobacco, Cigars, and/or Pipes): Yes Tobacco use, Select all that apply: 5 or more cigarettes per day (Per pt 1-2 pkts / day) Are you interested in Tobacco Cessation Medications?: No, patient refused Counseled patient on smoking cessation including recognizing danger situations, developing coping skills and basic information about quitting provided: Refused/Declined practical counseling Social History:  History  Alcohol Use  . 3.6 oz/week  . 6 Cans of beer per week      History  Drug Use  . Frequency: 5.0 times per week  . Types: "Crack" cocaine, Cocaine    Comment: crack/cocaine    Additional Social History:                           Allergies:  No Known Allergies Lab Results:  Results for orders placed or performed during the hospital encounter of 12/26/15 (from the past 48 hour(s))  Urine rapid drug screen (hosp performed)     Status: Abnormal   Collection Time: 12/26/15  3:15 PM  Result Value Ref Range   Opiates NONE DETECTED NONE DETECTED   Cocaine POSITIVE (A) NONE DETECTED   Benzodiazepines NONE DETECTED NONE DETECTED   Amphetamines NONE DETECTED NONE DETECTED   Tetrahydrocannabinol NONE DETECTED NONE DETECTED   Barbiturates NONE DETECTED NONE DETECTED    Comment:        DRUG SCREEN FOR MEDICAL PURPOSES ONLY.  IF CONFIRMATION IS NEEDED FOR ANY PURPOSE, NOTIFY LAB WITHIN 5 DAYS.        LOWEST DETECTABLE LIMITS FOR URINE DRUG SCREEN Drug Class       Cutoff (ng/mL) Amphetamine      1000 Barbiturate      200  Benzodiazepine   706 Tricyclics       237 Opiates          300 Cocaine          300 THC              50   CBC with Differential     Status: None   Collection Time: 12/26/15  3:52 PM  Result Value Ref Range   WBC 5.3 4.0 - 10.5 K/uL   RBC 4.57 3.87 - 5.11 MIL/uL   Hemoglobin 13.2 12.0 - 15.0 g/dL   HCT 41.3 36.0 - 46.0 %   MCV 90.4 78.0 - 100.0 fL   MCH 28.9 26.0 - 34.0 pg   MCHC 32.0 30.0 - 36.0 g/dL   RDW 15.0 11.5 - 15.5 %   Platelets 311 150 - 400 K/uL   Neutrophils Relative % 53 %   Neutro Abs 2.9 1.7 - 7.7 K/uL   Lymphocytes Relative 37 %   Lymphs Abs 2.0 0.7 - 4.0 K/uL   Monocytes Relative 7 %   Monocytes Absolute 0.4 0.1 - 1.0 K/uL   Eosinophils Relative 2 %   Eosinophils Absolute 0.1 0.0 - 0.7 K/uL   Basophils Relative 1 %   Basophils Absolute 0.0 0.0 - 0.1 K/uL  Basic metabolic panel     Status: Abnormal   Collection Time: 12/26/15  3:52 PM  Result Value Ref Range   Sodium 138 135 - 145  mmol/L   Potassium 4.6 3.5 - 5.1 mmol/L   Chloride 108 101 - 111 mmol/L   CO2 25 22 - 32 mmol/L   Glucose, Bld 117 (H) 65 - 99 mg/dL   BUN 16 6 - 20 mg/dL   Creatinine, Ser 0.82 0.44 - 1.00 mg/dL   Calcium 9.1 8.9 - 10.3 mg/dL   GFR calc non Af Amer >60 >60 mL/min   GFR calc Af Amer >60 >60 mL/min    Comment: (NOTE) The eGFR has been calculated using the CKD EPI equation. This calculation has not been validated in all clinical situations. eGFR's persistently <60 mL/min signify possible Chronic Kidney Disease.    Anion gap 5 5 - 15  Ethanol     Status: None   Collection Time: 12/26/15  3:52 PM  Result Value Ref Range   Alcohol, Ethyl (B) <5 <5 mg/dL    Comment:        LOWEST DETECTABLE LIMIT FOR SERUM ALCOHOL IS 5 mg/dL FOR MEDICAL PURPOSES ONLY     Blood Alcohol level:  Lab Results  Component Value Date   ETH <5 12/26/2015   ETH <5 62/83/1517    Metabolic Disorder Labs:  Lab Results  Component Value Date   HGBA1C (H) 06/21/2010    5.9 (NOTE)                                                                       According to the ADA Clinical Practice Recommendations for 2011, when HbA1c is used as a screening test:   >=6.5%   Diagnostic of Diabetes Mellitus           (if abnormal result  is confirmed)  5.7-6.4%   Increased risk of developing Diabetes Mellitus  References:Diagnosis and Classification  of Diabetes Mellitus,Diabetes [id number],9373,42(AJGOT 1):S62-S69 and Standards of Medical Care in         Diabetes - 2011,Diabetes [id number],55  (Suppl 1):S11-S61.   MPG 123 (H) 06/21/2010   No results found for: PROLACTIN Lab Results  Component Value Date   CHOL (H) 06/22/2010    225        ATP III CLASSIFICATION:  <200     mg/dL   Desirable  200-239  mg/dL   Borderline High  >=240    mg/dL   High          TRIG 114 06/22/2010   HDL 43 06/22/2010   CHOLHDL 5.2 06/22/2010   VLDL 23 06/22/2010   LDLCALC (H) 06/22/2010    159        Total Cholesterol/HDL:CHD  Risk Coronary Heart Disease Risk Table                     Men   Women  1/2 Average Risk   3.4   3.3  Average Risk       5.0   4.4  2 X Average Risk   9.6   7.1  3 X Average Risk  23.4   11.0        Use the calculated Patient Ratio above and the CHD Risk Table to determine the patient's CHD Risk.        ATP III CLASSIFICATION (LDL):  <100     mg/dL   Optimal  100-129  mg/dL   Near or Above                    Optimal  130-159  mg/dL   Borderline  160-189  mg/dL   High  >190     mg/dL   Very High    Current Medications: Current Facility-Administered Medications  Medication Dose Route Frequency Provider Last Rate Last Dose  . benztropine (COGENTIN) tablet 0.5 mg  0.5 mg Oral BID Shuvon B Rankin, NP   0.5 mg at 12/27/15 1947  . divalproex (DEPAKOTE ER) 24 hr tablet 500 mg  500 mg Oral BID Shuvon B Rankin, NP   500 mg at 12/27/15 1947  . [START ON 12/28/2015] hydrochlorothiazide (MICROZIDE) capsule 12.5 mg  12.5 mg Oral Daily Shuvon B Rankin, NP      . [START ON 12/28/2015] lisinopril (PRINIVIL,ZESTRIL) tablet 5 mg  5 mg Oral Daily Shuvon B Rankin, NP      . LORazepam (ATIVAN) tablet 1 mg  1 mg Oral Q8H PRN Shuvon B Rankin, NP      . magnesium hydroxide (MILK OF MAGNESIA) suspension 30 mL  30 mL Oral Daily PRN Shuvon B Rankin, NP   30 mL at 12/27/15 2028  . [START ON 12/28/2015] nicotine (NICODERM CQ - dosed in mg/24 hours) patch 21 mg  21 mg Transdermal Daily Shuvon B Rankin, NP      . [START ON 12/28/2015] potassium chloride (K-DUR,KLOR-CON) CR tablet 10 mEq  10 mEq Oral Daily Shuvon B Rankin, NP      . risperiDONE (RISPERDAL) tablet 2 mg  2 mg Oral BID Shuvon B Rankin, NP   2 mg at 12/27/15 1947  . traZODone (DESYREL) tablet 50 mg  50 mg Oral QHS,MR X 1 Rozetta Nunnery, NP       PTA Medications: Prescriptions Prior to Admission  Medication Sig Dispense Refill Last Dose  . ciprofloxacin (CIPRO) 500 MG tablet Take 1 tablet (500 mg total) by mouth  2 (two) times daily. (Patient not  taking: Reported on 12/26/2015) 14 tablet 0 Not Taking at Unknown time  . divalproex (DEPAKOTE ER) 500 MG 24 hr tablet Take 1 tablet (500 mg total) by mouth 2 (two) times daily. (Patient not taking: Reported on 12/26/2015)   Not Taking at Unknown time  . hydrochlorothiazide (MICROZIDE) 12.5 MG capsule Take 1 capsule (12.5 mg total) by mouth daily. (Patient not taking: Reported on 12/26/2015) 30 capsule 0 Not Taking at Unknown time  . lisinopril (PRINIVIL,ZESTRIL) 5 MG tablet Take 1 tablet (5 mg total) by mouth daily. (Patient not taking: Reported on 12/26/2015) 30 tablet 0 Not Taking at Unknown time  . paliperidone (INVEGA SUSTENNA) 156 MG/ML SUSP injection Inject 156 mg into the muscle every 30 (thirty) days.   september  . potassium chloride (K-DUR,KLOR-CON) 10 MEQ tablet Take 1 tablet (10 mEq total) by mouth daily. (Patient not taking: Reported on 12/26/2015) 30 tablet 0 Not Taking at Unknown time  . risperiDONE (RISPERDAL) 2 MG tablet Take 1 tablet (2 mg total) by mouth 2 (two) times daily. (Patient not taking: Reported on 12/26/2015) 60 tablet 0 Not Taking at Unknown time    Musculoskeletal: Strength & Muscle Tone: within normal limits Gait & Station: normal Patient leans: N/A  Psychiatric Specialty Exam: Physical Exam  Constitutional: She is oriented to person, place, and time. She appears well-developed and well-nourished. She appears distressed.  HENT:  Head: Normocephalic and atraumatic.  Right Ear: External ear normal.  Left Ear: External ear normal.  Eyes: Conjunctivae are normal. Right eye exhibits no discharge. Left eye exhibits no discharge. No scleral icterus.  Respiratory: Effort normal.  Musculoskeletal: Normal range of motion.  Neurological: She is alert and oriented to person, place, and time.  Skin: Skin is warm and dry. She is not diaphoretic.  Psychiatric: Her mood appears not anxious. Her affect is not angry, not blunt, not labile and not inappropriate. Her speech is  rapid and/or pressured and tangential. Her speech is not slurred. She is not agitated, not aggressive, not actively hallucinating and not combative. Thought content is not paranoid and not delusional. Cognition and memory are not impaired. She expresses impulsivity. She exhibits a depressed mood. She expresses no homicidal ideation.  Passive suicide thoughts.    Review of Systems  Constitutional: Negative for chills, fever, malaise/fatigue and weight loss.  Gastrointestinal: Positive for constipation. Negative for abdominal pain, diarrhea, heartburn, nausea and vomiting.  Skin: Negative for itching and rash.  Neurological: Negative for dizziness.  Psychiatric/Behavioral: Positive for depression and substance abuse. Negative for hallucinations and memory loss. The patient has insomnia. The patient is not nervous/anxious.        Suicidal Thoughts: Passive  All other systems reviewed and are negative.   Blood pressure 119/80, pulse 78, temperature 97.9 F (36.6 C), resp. rate 18, height _0  (1.702 m), weight 101.2 kg (223 lb), last menstrual period 01/13/2012, SpO2 100 %.Body mass index is 34.93 kg/m.  General Appearance: Disheveled  Eye Contact:  Good  Speech:  Clear and Coherent and Pressured  Volume:  Normal  Mood:  Depressed  Affect:  Congruent  Thought Process:  Goal Directed  Orientation:  Full (Time, Place, and Person)  Thought Content:  Rumination and Tangential  Suicidal Thoughts:  No  Homicidal Thoughts:  No  Memory:  Immediate;   Good Recent;   Good Remote;   Good  Judgement:  Fair  Insight:  Fair  Psychomotor Activity:  Normal  Concentration:  Concentration:  Fair and Attention Span: Fair  Recall:  Good  Fund of Knowledge:  Good  Language:  Good  Akathisia:  Negative  Handed:  Right  AIMS (if indicated):     Assets:  Communication Skills Desire for Improvement  ADL's:  Intact  Cognition:  WNL  Sleep:         Treatment Plan Summary: Daily contact with patient  to assess and evaluate symptoms and progress in treatment and Medication management  Observation Level/Precautions:  Continuous Observation Laboratory:  see above labs Psychotherapy:   Medications:  Continue cogentin 0.5 mg BID, Depakote 500 mg BID, Risperdal 2 mg BID, Trazodone 50 mg at night Consultations:   Discharge Concerns: Substance Abuse Resources  Estimated LOS:1 day Other:      Rozetta Nunnery, NP 10/15/20178:48 PM

## 2015-12-27 NOTE — ED Notes (Signed)
[facility] transport called for transfer.  

## 2015-12-27 NOTE — ED Provider Notes (Signed)
I was asked by nurse evaluate patient. Nurses concerned that doxycycline and CABG may been ordered in error. Patient has reports that she has no known infection. She does not know of any reason why she should be on an antibiotic. She is not diabetic. On exam she is alert and in no distress lungs clear auscultation heart regular rate and rhythm no murmurs skin warm and dry no rash and no lesion. I don't see any sign of infection. She is not known diabetic. Doxycycline and order for CBG discontinued   Doug SouSam Emika Tiano, MD 12/27/15 1210

## 2015-12-27 NOTE — Consult Note (Signed)
[facility] Consult   Reason for Consult:  Polysubstance abuse and passive suicidal ideation Referring Physician:  EDP Patient Identification: [name] MRN:  [id number] Principal Diagnosis: Polysubstance dependence ([place]) Diagnosis:   Patient Active Problem List   Diagnosis Date Noted  . Fever, unspecified [R50.9] 09/13/2015  . Leukocytosis [D72.829] 09/13/2015  . Acute encephalopathy [G93.40] 09/13/2015  . Substance abuse [F19.10] 09/12/2015  . Hallucination [R44.3]   . Homicidal ideation [R45.850]   . Schizophrenia, unspecified type ([place]) [F20.9]   . Schizoaffective disorder, bipolar type ([place]) [F25.0]   . Schizoaffective disorder ([place]) [F25.9] 03/29/2013  . Cocaine abuse [F14.10] 06/30/2011  . Non-compliant patient [Z91.19] 06/29/2011  . Hypertension [I10] 06/29/2011  . Alcohol dependence ([place]) [F10.20] 06/28/2011  . Polysubstance dependence ([place]) [F19.20] 04/03/2011  . Polysubstance abuse [F19.10] 06/21/2010    Total Time spent with patient: 45 minutes  Subjective:   [name] is a 55 y.o. female patient.  HPI:  Patient presented to [facility] yesterday with complaints of suicidal ideation and wanting assistance with her drug use and getting into a rehab.  Patient states that it is hard for her to quit related to where she lives; states that there is nothing but drugs in the area.  Patient also reports that she was recently in a rehab facility but left early because she didn't like it there.  Patient also states that she can not continue going like she is and that she will kill her self with the drug use.  Patient has had multiple admissions to [facility] and has [facility] ACTT team.  At this time patient denies homicidal ideation, psychosis, and paranoia; passive suicidal ideation related to drug use.   Past Psychiatric History: Schizoaffective disorder, polysubstance abuse.  [facility] ACTT team  Risk to Self: Suicidal Ideation: No Suicidal Intent:  No Is patient at risk for suicide?: No Suicidal Plan?: No Access to Means: No What has been your use of drugs/alcohol within the last 12 months?: Current use How many times?: 0 Other Self Harm Risks: na Triggers for Past Attempts: Unknown Intentional Self Injurious Behavior: None Risk to Others: Homicidal Ideation: No Thoughts of Harm to Others: No Current Homicidal Intent: No Current Homicidal Plan: No Access to Homicidal Means: No Identified Victim:  (na) History of harm to others?: No Assessment of Violence: None Noted Violent Behavior Description: na Does patient have access to weapons?: No Criminal Charges Pending?: No Does patient have a court date: No Prior Inpatient Therapy: Prior Inpatient Therapy: Yes Prior Therapy Dates: 2017,2016 Prior Therapy Facilty/Provider(s): [facility] Reason for Treatment: SA issues Prior Outpatient Therapy: Prior Outpatient Therapy: Yes Prior Therapy Dates: 2017 Prior Therapy Facilty/Provider(s): [facility] Reason for Treatment: MH issues Does patient have an ACCT team?: Yes Does patient have Intensive In-House Services?  : No Does patient have [facility] services? : No Does patient have P4CC services?: No  Past Medical History:  Past Medical History:  Diagnosis Date  . Arthritis   . Bipolar 1 disorder ([place])   . Obesity   . Psychiatric illness   . Schizo-affective schizophrenia ([place])   . Seizures ([place])   . Substance abuse     Past Surgical History:  Procedure Laterality Date  . STOMACH SURGERY     Family History:  Family History  Problem Relation Age of Onset  . Alzheimer's disease Mother   . Depression Mother    Family Psychiatric  History: Denies Social History:  History  Alcohol Use  . 3.6  oz/week  . 6 Cans of beer per week     History  Drug Use  . Frequency: 5.0 times per week  . Types: "Crack" cocaine, Cocaine    Comment: crack/cocaine    Social History   Social History  . Marital status: Single    Spouse  name: N/A  . Number of children: N/A  . Years of education: N/A   Social History Main Topics  . Smoking status: Current Every Day Smoker    Packs/day: 1.00    Types: Cigarettes  . Smokeless tobacco: Never Used  . Alcohol use 3.6 oz/week    6 Cans of beer per week  . Drug use:     Frequency: 5.0 times per week    Types: "Crack" cocaine, Cocaine     Comment: crack/cocaine  . Sexual activity: No   Other Topics Concern  . None   Social History Narrative  . None   Additional Social History:    Allergies:  No Known Allergies  Labs:  Results for orders placed or performed during the hospital encounter of 12/26/15 (from the past 48 hour(s))  Urine rapid drug screen (hosp performed)     Status: Abnormal   Collection Time: 12/26/15  3:15 PM  Result Value Ref Range   Opiates NONE DETECTED NONE DETECTED   Cocaine POSITIVE (A) NONE DETECTED   Benzodiazepines NONE DETECTED NONE DETECTED   Amphetamines NONE DETECTED NONE DETECTED   Tetrahydrocannabinol NONE DETECTED NONE DETECTED   Barbiturates NONE DETECTED NONE DETECTED    Comment:        DRUG SCREEN FOR MEDICAL PURPOSES ONLY.  IF CONFIRMATION IS NEEDED FOR ANY PURPOSE, NOTIFY LAB WITHIN 5 DAYS.        LOWEST DETECTABLE LIMITS FOR URINE DRUG SCREEN Drug Class       Cutoff (ng/mL) Amphetamine      1000 Barbiturate      200 Benzodiazepine   973 Tricyclics       532 Opiates          300 Cocaine          300 THC              50   CBC with Differential     Status: None   Collection Time: 12/26/15  3:52 PM  Result Value Ref Range   WBC 5.3 4.0 - 10.5 K/uL   RBC 4.57 3.87 - 5.11 MIL/uL   Hemoglobin 13.2 12.0 - 15.0 g/dL   HCT 41.3 36.0 - 46.0 %   MCV 90.4 78.0 - 100.0 fL   MCH 28.9 26.0 - 34.0 pg   MCHC 32.0 30.0 - 36.0 g/dL   RDW 15.0 11.5 - 15.5 %   Platelets 311 150 - 400 K/uL   Neutrophils Relative % 53 %   Neutro Abs 2.9 1.7 - 7.7 K/uL   Lymphocytes Relative 37 %   Lymphs Abs 2.0 0.7 - 4.0 K/uL   Monocytes  Relative 7 %   Monocytes Absolute 0.4 0.1 - 1.0 K/uL   Eosinophils Relative 2 %   Eosinophils Absolute 0.1 0.0 - 0.7 K/uL   Basophils Relative 1 %   Basophils Absolute 0.0 0.0 - 0.1 K/uL  Basic metabolic panel     Status: Abnormal   Collection Time: 12/26/15  3:52 PM  Result Value Ref Range   Sodium 138 135 - 145 mmol/L   Potassium 4.6 3.5 - 5.1 mmol/L   Chloride 108 101 - 111 mmol/L  CO2 25 22 - 32 mmol/L   Glucose, Bld 117 (H) 65 - 99 mg/dL   BUN 16 6 - 20 mg/dL   Creatinine, Ser 0.82 0.44 - 1.00 mg/dL   Calcium 9.1 8.9 - 10.3 mg/dL   GFR calc non Af Amer >60 >60 mL/min   GFR calc Af Amer >60 >60 mL/min    Comment: (NOTE) The eGFR has been calculated using the CKD EPI equation. This calculation has not been validated in all clinical situations. eGFR's persistently <60 mL/min signify possible Chronic Kidney Disease.    Anion gap 5 5 - 15  Ethanol     Status: None   Collection Time: 12/26/15  3:52 PM  Result Value Ref Range   Alcohol, Ethyl (B) <5 <5 mg/dL    Comment:        LOWEST DETECTABLE LIMIT FOR SERUM ALCOHOL IS 5 mg/dL FOR MEDICAL PURPOSES ONLY     Current Facility-Administered Medications  Medication Dose Route Frequency Provider Last Rate Last Dose  . acetaminophen (TYLENOL) tablet 650 mg  650 mg Oral Q4H PRN Nat Christen, MD      . alum & mag hydroxide-simeth (MAALOX/MYLANTA) 200-200-20 MG/5ML suspension 30 mL  30 mL Oral PRN Nat Christen, MD      . divalproex (DEPAKOTE ER) 24 hr tablet 500 mg  500 mg Oral BID Shuvon B Rankin, NP      . hydrochlorothiazide (MICROZIDE) capsule 12.5 mg  12.5 mg Oral Daily Shuvon B Rankin, NP      . ibuprofen (ADVIL,MOTRIN) tablet 600 mg  600 mg Oral Q8H PRN Nat Christen, MD      . lisinopril (PRINIVIL,ZESTRIL) tablet 5 mg  5 mg Oral Daily Shuvon B Rankin, NP      . LORazepam (ATIVAN) tablet 1 mg  1 mg Oral Q8H PRN Nat Christen, MD      . nicotine (NICODERM CQ - dosed in mg/24 hours) patch 21 mg  21 mg Transdermal Daily Nat Christen, MD    21 mg at 12/27/15 0924  . ondansetron (ZOFRAN) tablet 4 mg  4 mg Oral Q8H PRN Nat Christen, MD      . potassium chloride (K-DUR,KLOR-CON) CR tablet 10 mEq  10 mEq Oral Daily Shuvon B Rankin, NP      . risperiDONE (RISPERDAL) tablet 2 mg  2 mg Oral BID Shuvon B Rankin, NP       Current Outpatient Prescriptions  Medication Sig Dispense Refill  . paliperidone (INVEGA SUSTENNA) 156 MG/ML SUSP injection Inject 156 mg into the muscle every 30 (thirty) days.    . ciprofloxacin (CIPRO) 500 MG tablet Take 1 tablet (500 mg total) by mouth 2 (two) times daily. (Patient not taking: Reported on 12/26/2015) 14 tablet 0  . divalproex (DEPAKOTE ER) 500 MG 24 hr tablet Take 1 tablet (500 mg total) by mouth 2 (two) times daily. (Patient not taking: Reported on 12/26/2015)    . hydrochlorothiazide (MICROZIDE) 12.5 MG capsule Take 1 capsule (12.5 mg total) by mouth daily. (Patient not taking: Reported on 12/26/2015) 30 capsule 0  . lisinopril (PRINIVIL,ZESTRIL) 5 MG tablet Take 1 tablet (5 mg total) by mouth daily. (Patient not taking: Reported on 12/26/2015) 30 tablet 0  . potassium chloride (K-DUR,KLOR-CON) 10 MEQ tablet Take 1 tablet (10 mEq total) by mouth daily. (Patient not taking: Reported on 12/26/2015) 30 tablet 0  . risperiDONE (RISPERDAL) 2 MG tablet Take 1 tablet (2 mg total) by mouth 2 (two) times daily. (Patient not taking: Reported on  12/26/2015) 60 tablet 0    Musculoskeletal: Strength & Muscle Tone: within normal limits Gait & Station: normal Patient leans: N/A  Psychiatric Specialty Exam: Physical Exam  Constitutional: She is oriented to person, place, and time.  Neck: Normal range of motion.  Respiratory: Effort normal.  Musculoskeletal: Normal range of motion.  Neurological: She is alert and oriented to person, place, and time.  Psychiatric: Her speech is normal. She is not actively hallucinating. Thought content is not paranoid and not delusional. Cognition and memory are normal. She  expresses impulsivity. She exhibits a depressed mood. She expresses no homicidal ideation.    Review of Systems  Psychiatric/Behavioral: Positive for depression and substance abuse. Negative for hallucinations and memory loss. Suicidal ideas: Passive. The patient is nervous/anxious. The patient does not have insomnia.   All other systems reviewed and are negative.   Blood pressure 156/86, pulse 85, temperature 98.5 F (36.9 C), temperature source Oral, resp. rate 16, weight 100.9 kg (222 lb 8 oz), last menstrual period 01/13/2012, SpO2 100 %.Body mass index is 34.85 kg/m.  General Appearance: Disheveled  Eye Contact:  Good  Speech:  Clear and Coherent and Normal Rate  Volume:  Increased  Mood:  Depressed  Affect:  Congruent  Thought Process:  Goal Directed  Orientation:  Full (Time, Place, and Person)  Thought Content:  Rumination  Suicidal Thoughts:  No  Homicidal Thoughts:  No  Memory:  Immediate;   Good Recent;   Good Remote;   Good  Judgement:  Fair  Insight:  Fair  Psychomotor Activity:  Normal  Concentration:  Concentration: Fair and Attention Span: Fair  Recall:  Good  Fund of Knowledge:  Fair  Language:  NA  Akathisia:  No  Handed:  Right  AIMS (if indicated):     Assets:  Communication Skills Desire for Improvement  ADL's:  Intact  Cognition:  WNL  Sleep:      Check with ACTT to see last dose Invega Sustenna 156 mg/ML  Treatment Plan Summary: Medication management and Plan 24 hour observation  Disposition: Recommend 24 hour observation and help with resources for substance abuse   Patient has been accepted to [facility] Observation Unit.    Earleen Newport, NP 12/27/2015 1:07 PM   Patient case reviewed with NP in rounds, agree with NP note and assessment

## 2015-12-27 NOTE — Progress Notes (Signed)
[place] She stated that she called and spoke with a member there and they are going to pick her up upon discharge tomorrow.

## 2015-12-27 NOTE — BH Assessment (Signed)
[facility]   12/27/15: [facility] at 1400

## 2015-12-27 NOTE — Progress Notes (Signed)
Patient ID: Mary LefortDebra Hughson, female   DOB: 07-07-1960, 55 y.o.   MRN: [id number] PER STATE REGULATIONS 482.30  THIS CHART WAS REVIEWED FOR MEDICAL NECESSITY WITH RESPECT TO THE PATIENT'S ADMISSION/DURATION OF STAY.  NEXT REVIEW DATE:12/31/15  Loura HaltBARBARA Barrie Sigmund, RN, BSN CASE MANAGER

## 2015-12-27 NOTE — Progress Notes (Signed)
Admission Note: Pt is a 55 y/o AAF [facility] help with "etoh detox and crack". Pt presents anxious, fidgety on initial approach. Denies SI, HI, AVH and pain at the time. Per report--pt was [facility] (et[facility], cocaine abuse), however, pt left the programm voluntarily 2 weeks ago. Pt does have a h/o Bipolar d/o as well. Speech was pressured and tangential, repeatedly told writer "I want to go for long term help, my children don't want to speak to me, people looking for me in the street for money, I can't go home". Pt observed calling peers and family on phone to send her money to pay a female she owed in her community or else "he will get me". Pt has an Investment banker, operationalACTT team ([facility]) whom she contacted and staff did agreed to come pick her up when d/c tomorrow. Skin assessment done, old scar noted on pt's abdomen. All belongings secured in locker 53. Unit orientation and routines discussed with pt. Understanding verbalized. Encouraged to comply with current treatment regimen. Support and availability provided. Will continue to monitor pt for safety.

## 2015-12-27 NOTE — Progress Notes (Signed)
Pt in bed at shift change.  Pt is awake and oriented.  Pt complaints include the "bad food" and pain in her back.  Pt sts she is unable to eat the food as she does not like it.  Pt also sts she does not have her dentures.  Pt sts she has an ACTT team and she "normally" gets a shot for back pain.  Pt sts the "shot" costs 2 thousand dollars.   Writer offered to speak to provider about PO pain med.  Pt sts she only wants shot that she will receive it tomorrow when she is discharged.  Pt given soft food bar and ice cream. Pt given support and encouragement. Pt is continuously observed for safety while on the unit.  Pt remains safe.

## 2015-12-28 DIAGNOSIS — F250 Schizoaffective disorder, bipolar type: Secondary | ICD-10-CM

## 2015-12-28 MED ORDER — LISINOPRIL 5 MG PO TABS: 5.0000 mg | ORAL_TABLET | Freq: Every day | ORAL | 0 refills | Status: DC

## 2015-12-28 MED ORDER — RISPERIDONE 2 MG PO TABS: 2.0000 mg | ORAL_TABLET | Freq: Two times a day (BID) | ORAL | 0 refills | Status: DC

## 2015-12-28 MED ORDER — HYDROCHLOROTHIAZIDE 12.5 MG PO CAPS: 12.5000 mg | ORAL_CAPSULE | Freq: Every day | ORAL | 0 refills | Status: DC

## 2015-12-28 MED ORDER — POTASSIUM CHLORIDE CRYS ER 10 MEQ PO TBCR: 10.0000 meq | EXTENDED_RELEASE_TABLET | Freq: Every day | ORAL | 0 refills | Status: DC

## 2015-12-28 MED ORDER — PALIPERIDONE PALMITATE 156 MG/ML IM SUSP: 156.0000 mg | INTRAMUSCULAR | Status: DC

## 2015-12-28 MED ORDER — DIVALPROEX SODIUM ER 500 MG PO TB24: 500.0000 mg | ORAL_TABLET | Freq: Two times a day (BID) | ORAL | Status: DC

## 2015-12-28 NOTE — Progress Notes (Signed)
Patient is stating constantly, "Where is the doctor, cause I am ready to go to get my Invega shot?" She has several complaints especially regarding the food. She is bored and upset that she could not be admitted to the Adult Inpatient side. She stated that her ACT personnel would not be available until 1:00 pm. TTS counselor and NP has spoken with patient. Patient is awaiting ride for discharge.   Patient remains safe with constant monitoring. Patient is offered support, encouragement, and education. Medications to be administered.   Patient is compliant. Will continue to monitor.

## 2015-12-28 NOTE — Progress Notes (Signed)
Patient was given belongings and AVS. Patient escorted to lobby with NT and awaiting ACT team.   D/C time is 935.

## 2015-12-28 NOTE — Discharge Summary (Signed)
            [facility]-Observation Unit Discharge Summary   Admit date: 12/27/2015 Discharge date: 12/28/2015    From Consult Note: [facility] with complaints of suicidal ideation and wanting assistance with her drug use and getting into a rehab. Patient states that it is hard for her to quit related to where she lives; states that there is nothing but drugs in the area. Patient also reports that she was recently in a rehab facility but left early because she didn't like it there. Patient also states that she can not continue going like she is and that she will kill her self with the drug use. [facility] and has [facility] ACTT team. At this time patient denies homicidal ideation, psychosis, and paranoia; passive suicidal ideation related to drug use.   On Exam by Nira ConnJason Berry NP 12/27/2015 8:48 pm: Patient expresses information as above. Patient is pleasant, her speech is tangential and pressured. Reports that she would like assistance with getting into a rehab. Reports that she lives with a couple and pays $400 per month rent, but states the neighborhood is bad and contributes to her habit. Reports that she has little family and social support. Reports that her sister and ex mother in law recently died and she went to both funerals "high". Reports that her children all have families, so she tries not to bother them. Denies suicidal ideations, homicidal ideations, and audiovisual hallucinations.    Patient was monitored overnight in the [facility]-Observation Unit. Observation staff notified writer that patient was requesting discharge on the morning of 12/28/2015. Patient was coherent during assessment denying any psychotic symptoms or those suggestive of depression. Andre LefortDebra Malczewski appeared motivated to follow up with her [facility] ACTT team who agreed to pick up the patient later today. The patient reported "I need to get my Invega shot."  Stanton KidneyDebra verbalized that "I feel stable to go." She denies any suicidal or homicidal ideation. There were no signs of symptoms suggestive of withdrawal. On admission his urine drugs screen was positive for cocaine and alcohol level was negative. Patient declined needing any prescriptions stating "I already have my medications. I also am followed by the ACTT team." [facility] in no acute distress with all belongings returned to her.

## 2015-12-28 NOTE — Progress Notes (Signed)
Patient is expected to discharge this morning at 10am. She reported that she feels stable and ready to go. Patient stated that she called and spoke with a member from [facility] and they are going to pick her up today. [name] 12/28/2015 9:30am

## 2016-06-11 ENCOUNTER — Encounter (HOSPITAL_COMMUNITY): Payer: Self-pay

## 2016-06-11 ENCOUNTER — Emergency Department (HOSPITAL_COMMUNITY)
Admission: EM | Admit: 2016-06-11 | Discharge: 2016-06-11 | Disposition: A | Discharge status: Home / Self Care | Payer: Medicare Other | Attending: Emergency Medicine | Admitting: Emergency Medicine

## 2016-06-11 DIAGNOSIS — I10 Essential (primary) hypertension: Secondary | ICD-10-CM | POA: Insufficient documentation

## 2016-06-11 DIAGNOSIS — F1910 Other psychoactive substance abuse, uncomplicated: Secondary | ICD-10-CM

## 2016-06-11 DIAGNOSIS — F1410 Cocaine abuse, uncomplicated: Secondary | ICD-10-CM | POA: Insufficient documentation

## 2016-06-11 DIAGNOSIS — Z79899 Other long term (current) drug therapy: Secondary | ICD-10-CM | POA: Diagnosis not present

## 2016-06-11 DIAGNOSIS — F17210 Nicotine dependence, cigarettes, uncomplicated: Secondary | ICD-10-CM | POA: Diagnosis not present

## 2016-06-11 DIAGNOSIS — R451 Restlessness and agitation: Secondary | ICD-10-CM | POA: Diagnosis not present

## 2016-06-11 LAB — COMPREHENSIVE METABOLIC PANEL
ALBUMIN: 3.6 g/dL (ref 3.5–5.0)
ALT: 20 U/L (ref 14–54)
AST: 19 U/L (ref 15–41)
Alkaline Phosphatase: 60 U/L (ref 38–126)
Anion gap: 5 (ref 5–15)
BUN: 13 mg/dL (ref 6–20)
CHLORIDE: 105 mmol/L (ref 101–111)
CO2: 28 mmol/L (ref 22–32)
CREATININE: 0.84 mg/dL (ref 0.44–1.00)
Calcium: 8.9 mg/dL (ref 8.9–10.3)
GFR calc Af Amer: >60 mL/min (ref >60)
GFR calc non Af Amer: >60 mL/min (ref >60)
Glucose, Bld: 140 mg/dL — ABNORMAL HIGH (ref 65–99)
POTASSIUM: 4.2 mmol/L (ref 3.5–5.1)
SODIUM: 138 mmol/L (ref 135–145)
Total Bilirubin: 0.6 mg/dL (ref 0.3–1.2)
Total Protein: 7 g/dL (ref 6.5–8.1)

## 2016-06-11 LAB — RAPID URINE DRUG SCREEN, HOSP PERFORMED
AMPHETAMINES: NOT DETECTED
Barbiturates: NOT DETECTED
Benzodiazepines: NOT DETECTED
COCAINE: POSITIVE — AB
Opiates: NOT DETECTED
TETRAHYDROCANNABINOL: NOT DETECTED

## 2016-06-11 LAB — CBC WITH DIFFERENTIAL/PLATELET
BASOS ABS: 0 10*3/uL (ref 0.0–0.1)
BASOS PCT: 0 %
EOS ABS: 0.1 10*3/uL (ref 0.0–0.7)
EOS PCT: 1 %
HCT: 39.2 % (ref 36.0–46.0)
Hemoglobin: 12.9 g/dL (ref 12.0–15.0)
LYMPHS PCT: 32 %
Lymphs Abs: 2 10*3/uL (ref 0.7–4.0)
MCH: 29.1 pg (ref 26.0–34.0)
MCHC: 32.9 g/dL (ref 30.0–36.0)
MCV: 88.3 fL (ref 78.0–100.0)
Monocytes Absolute: 0.4 10*3/uL (ref 0.1–1.0)
Monocytes Relative: 7 %
Neutro Abs: 3.8 10*3/uL (ref 1.7–7.7)
Neutrophils Relative %: 60 %
PLATELETS: 266 10*3/uL (ref 150–400)
RBC: 4.44 MIL/uL (ref 3.87–5.11)
RDW: 15.8 % — ABNORMAL HIGH (ref 11.5–15.5)
WBC: 6.3 10*3/uL (ref 4.0–10.5)

## 2016-06-11 LAB — ETHANOL: Alcohol, Ethyl (B): <5 mg/dL (ref <5)

## 2016-06-11 MED ORDER — IBUPROFEN 200 MG PO TABS
600.0000 mg | ORAL_TABLET | Freq: Once | ORAL | Status: AC
  Administered 2016-06-11: 600 mg via ORAL
  Filled 2016-06-11: qty 3

## 2016-06-11 MED ORDER — LORAZEPAM 1 MG PO TABS: 0.0000 mg | ORAL_TABLET | Freq: Four times a day (QID) | ORAL | Status: DC

## 2016-06-11 MED ORDER — LORAZEPAM 1 MG PO TABS: 0.0000 mg | ORAL_TABLET | Freq: Two times a day (BID) | ORAL | Status: DC

## 2016-06-11 NOTE — BH Assessment (Signed)
Consulted with Assunta Found, NP and Dr Lolly Mustache who state that patient does not meet inpatient criteria.   Davina Poke, LCSW Therapeutic Triage Specialist [facility] 06/11/2016 4:50 PM

## 2016-06-11 NOTE — ED Notes (Signed)
Patient called her ride and reports she can get into her house.  Patient waiting for her ride.

## 2016-06-11 NOTE — ED Provider Notes (Signed)
[facility] DEPT Provider Note   CSN: [id number] Arrival date & time: 06/11/16  1207     History   Chief Complaint Chief Complaint  Patient presents with  . Medication Reaction    HPI [name] is a 56 y.o. female.  HPI Patient states she was unable to get into her house in [place], [place]. States she came to [place] "to party". States she's been walking the streets using drugs and drinking alcohol for last few days. Last used crack cocaine at 4 AM this morning. Last drank alcohol at roughly the same time. She is unable to give an estimate for how much she normally drinks. Denies any other coingestants. She is requesting placement at behavioral health for detox. When asked if she is having any hallucinations, she states she has been will not elaborate. Denies suicidal ideation but admits to "wanting to hurt people if they don't leave her alone."  Past Medical History:  Diagnosis Date  . Arthritis   . Bipolar 1 disorder (HCC)   . Obesity   . Psychiatric illness   . Schizo-affective schizophrenia (HCC)   . Seizures (HCC)   . Substance abuse     Patient Active Problem List   Diagnosis Date Noted  . Substance induced mood disorder (HCC) 12/27/2015  . Fever, unspecified 09/13/2015  . Leukocytosis 09/13/2015  . Acute encephalopathy 09/13/2015  . Substance abuse 09/12/2015  . Hallucination   . Homicidal ideation   . Schizophrenia, unspecified type (HCC)   . Schizoaffective disorder, bipolar type (HCC)   . Schizoaffective disorder (HCC) 03/29/2013  . Cocaine abuse 06/30/2011  . Non-compliant patient 06/29/2011  . Hypertension 06/29/2011  . Alcohol dependence (HCC) 06/28/2011  . Polysubstance dependence (HCC) 04/03/2011  . Polysubstance abuse 06/21/2010  . Schizophrenia (HCC) 06/21/2010    Past Surgical History:  Procedure Laterality Date  . STOMACH SURGERY      OB History    No data available       Home Medications    Prior to Admission medications     Medication Sig Start Date End Date Taking? Authorizing Provider  divalproex (DEPAKOTE ER) 500 MG 24 hr tablet Take 1 tablet (500 mg total) by mouth 2 (two) times daily. 12/28/15   Thermon Leyland, NP  hydrochlorothiazide (MICROZIDE) 12.5 MG capsule Take 1 capsule (12.5 mg total) by mouth daily. 12/28/15   Thermon Leyland, NP  lisinopril (PRINIVIL,ZESTRIL) 5 MG tablet Take 1 tablet (5 mg total) by mouth daily. 12/28/15   Thermon Leyland, NP  paliperidone (INVEGA SUSTENNA) 156 MG/ML SUSP injection Inject 1 mL (156 mg total) into the muscle every 30 (thirty) days. 12/28/15   Thermon Leyland, NP  potassium chloride (K-DUR,KLOR-CON) 10 MEQ tablet Take 1 tablet (10 mEq total) by mouth daily. 12/28/15   Thermon Leyland, NP  risperiDONE (RISPERDAL) 2 MG tablet Take 1 tablet (2 mg total) by mouth 2 (two) times daily. 12/28/15   Thermon Leyland, NP    Family History Family History  Problem Relation Age of Onset  . Alzheimer's disease Mother   . Depression Mother     Social History Social History  Substance Use Topics  . Smoking status: Current Every Day Smoker    Packs/day: 1.00    Types: Cigarettes  . Smokeless tobacco: Never Used  . Alcohol use 3.6 oz/week    6 Cans of beer per week     Allergies   Patient has no known allergies.   Review of Systems Review  of Systems  Constitutional: Negative for chills and fever.  Respiratory: Negative for shortness of breath.   Cardiovascular: Negative for chest pain.  Gastrointestinal: Negative for abdominal pain, nausea and vomiting.  Musculoskeletal: Negative for neck pain.  Skin: Negative for rash and wound.  Neurological: Negative for dizziness, weakness, numbness and headaches.  Psychiatric/Behavioral: Positive for agitation and hallucinations. Negative for suicidal ideas.  All other systems reviewed and are negative.    Physical Exam Updated Vital Signs BP (!) 142/80 (BP Location: Right Arm)   Pulse 86   Temp 98.3 F (36.8 C) (Oral)   Resp  20   LMP 01/13/2012   SpO2 98%   Physical Exam  Constitutional: She is oriented to person, place, and time. She appears well-developed and well-nourished. No distress.  Eating a sandwich  HENT:  Head: Normocephalic and atraumatic.  Mouth/Throat: Oropharynx is clear and moist.  Eyes: EOM are normal. Pupils are equal, round, and reactive to light.  Neck: Normal range of motion. Neck supple.  No meningismus  Cardiovascular: Normal rate and regular rhythm.   Pulmonary/Chest: Effort normal and breath sounds normal.  Abdominal: Soft. Bowel sounds are normal. There is no tenderness. There is no rebound and no guarding.  Musculoskeletal: Normal range of motion. She exhibits no edema or tenderness.  Neurological: She is alert and oriented to person, place, and time.  Slurred speech. Moving all extremities without focal deficit. Sensation intact.  Skin: Skin is warm and dry. Capillary refill takes less than 2 seconds. No rash noted. No erythema.  Psychiatric:  Mildly agitated.  Nursing note and vitals reviewed.    ED Treatments / Results  Labs (all labs ordered are listed, but only abnormal results are displayed) Labs Reviewed  CBC WITH DIFFERENTIAL/PLATELET - Abnormal; Notable for the following:       Result Value   RDW 15.8 (*)    All other components within normal limits  COMPREHENSIVE METABOLIC PANEL - Abnormal; Notable for the following:    Glucose, Bld 140 (*)    All other components within normal limits  RAPID URINE DRUG SCREEN, HOSP PERFORMED - Abnormal; Notable for the following:    Cocaine POSITIVE (*)    All other components within normal limits  ETHANOL    EKG  EKG Interpretation None       Radiology No results found.  Procedures Procedures (including critical care time)  Medications Ordered in ED Medications  ibuprofen (ADVIL,MOTRIN) tablet 600 mg (600 mg Oral Given 06/11/16 1739)     Initial Impression / Assessment and Plan / ED Course  I have  reviewed the triage vital signs and the nursing notes.  Pertinent labs & imaging results that were available during my care of the patient were reviewed by me and considered in my medical decision making (see chart for details).     Patient is medically cleared for psychiatric evaluation.  Final Clinical Impressions(s) / ED Diagnoses   Final diagnoses:  Drug abuse    New Prescriptions Discharge Medication List as of 06/11/2016  4:48 PM       Loren Racer, MD 06/12/16 [phone]
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
## 2016-06-11 NOTE — ED Triage Notes (Signed)
Thus far, since arrival she has been shouting demands; including asking for food; blankets, etc. She ambulates constantly and uses our phone repetitively. She states "I'm about to go off!" At the conclusion of my interview it is still unclear what she wants.

## 2016-06-11 NOTE — BH Assessment (Addendum)
Patients nurse states that patient has stated that she no longer has a ride home. Contacted patients guardian and informed her that patients plans fell through and the ACT Team has not been available via phone. Patients guardian states that she will contact the ACT Team to see if someone is available to pick the patient up and if the patient has been discharged and is unable to find a ride to [place], she can be discharged to the [facility]. Informed patients nurse of the patients guardians statements.   Davina Poke, LCSW Therapeutic Triage Specialist [facility] 06/11/2016 6:50 PM

## 2016-06-11 NOTE — BH Assessment (Signed)
Contacted patients guardian, [name] [phone] to inform her of patient being seen in the ED. There was an outgoing message requesting to call [phone] if the call was after hours or weekend.   Called [phone] and left a message for the on call worker to be paged to [phone].  Davina Poke, LCSW Therapeutic Triage Specialist [facility] 06/11/2016 4:53 PM

## 2016-06-11 NOTE — ED Notes (Signed)
Bed: [facility] Expected date:  Expected time:  Means of arrival:  Comments: 

## 2016-06-11 NOTE — ED Notes (Signed)
Patient reports she is having lower back pain from walking that she rates as a 10/10.  Asking for pain medication.

## 2016-06-11 NOTE — BH Assessment (Signed)
Received a return call from [name] stating that the patient lives in a private residence and the guardian, who  is also her payee, is paying for the patient to live there. She states that the patient saw her ACT Team on Thursday and the guardian was notified that the ACT Team provider was dropping her off at another residence due to no one being home. ACT Team informed guardian that patients house mates would pick her up from [place]. Patients guardian states that she has not heard from the patient since that time and states that she is going to reach out to patients boarding house to be sure that someone is available in the home to open the door for the patient.   [name], patients guardian, called back and states that no one answered the numbers provided.  TTS informed the  Patients guardian that patients nurse called and states that patient has a ride to "her house" and states that she has called a ride and requested to leave. Asked patients guardian if patient could leave and go to her house. Patients guardian states that patient can leave and return to her residents. Patients guardian states that she is also her payee and assures that she pays the patients bills and the patient gets an allowance weekly. She states that patient complains about this amount, however, no one in the home has access to the patients money. She states that the patient can be discharged safely back to her residence if she has called a ride and she will followed up. Informed patients nurse.  Davina Poke, LCSW Therapeutic Triage Specialist [facility] 06/11/2016 6:14 PM

## 2016-06-11 NOTE — ED Notes (Signed)
Patient reported to nurse that now she does not have a ride home.

## 2016-06-11 NOTE — BH Assessment (Signed)
Contacted patients ACT Team at [phone] and there was no answer and no option to leave a voicemail.   Davina Poke, LCSW Therapeutic Triage Specialist [facility] 06/11/2016 6:19 PM

## 2016-06-11 NOTE — ED Triage Notes (Signed)
Pt requesting detox from crack.  Last used at 0400 today.  Also uses etoh.  Denies SI/HI.  No other complaints per EMS report.  EMS VS: 147/94, 102,16,97% RA, CBG 157

## 2016-12-02 ENCOUNTER — Emergency Department (HOSPITAL_COMMUNITY): Payer: Medicare Other

## 2016-12-02 ENCOUNTER — Encounter (HOSPITAL_COMMUNITY): Payer: Self-pay | Admitting: Emergency Medicine

## 2016-12-02 ENCOUNTER — Emergency Department (HOSPITAL_COMMUNITY)
Admission: EM | Admit: 2016-12-02 | Discharge: 2016-12-03 | Disposition: A | Discharge status: Home / Self Care | Payer: Medicare Other | Attending: Emergency Medicine | Admitting: Emergency Medicine

## 2016-12-02 DIAGNOSIS — Z79899 Other long term (current) drug therapy: Secondary | ICD-10-CM | POA: Diagnosis not present

## 2016-12-02 DIAGNOSIS — F17210 Nicotine dependence, cigarettes, uncomplicated: Secondary | ICD-10-CM | POA: Diagnosis not present

## 2016-12-02 DIAGNOSIS — F250 Schizoaffective disorder, bipolar type: Secondary | ICD-10-CM | POA: Diagnosis present

## 2016-12-02 DIAGNOSIS — R52 Pain, unspecified: Secondary | ICD-10-CM

## 2016-12-02 DIAGNOSIS — I10 Essential (primary) hypertension: Secondary | ICD-10-CM | POA: Insufficient documentation

## 2016-12-02 DIAGNOSIS — F1410 Cocaine abuse, uncomplicated: Secondary | ICD-10-CM

## 2016-12-02 DIAGNOSIS — F1994 Other psychoactive substance use, unspecified with psychoactive substance-induced mood disorder: Secondary | ICD-10-CM | POA: Diagnosis not present

## 2016-12-02 DIAGNOSIS — F99 Mental disorder, not otherwise specified: Secondary | ICD-10-CM | POA: Insufficient documentation

## 2016-12-02 DIAGNOSIS — R441 Visual hallucinations: Secondary | ICD-10-CM | POA: Diagnosis present

## 2016-12-02 LAB — CBC WITH DIFFERENTIAL/PLATELET
BASOS PCT: 1 %
Basophils Absolute: 0 10*3/uL (ref 0.0–0.1)
EOS ABS: 0 10*3/uL (ref 0.0–0.7)
EOS PCT: 1 %
HCT: 37.1 % (ref 36.0–46.0)
HEMOGLOBIN: 12.1 g/dL (ref 12.0–15.0)
LYMPHS ABS: 1.9 10*3/uL (ref 0.7–4.0)
Lymphocytes Relative: 34 %
MCH: 29 pg (ref 26.0–34.0)
MCHC: 32.6 g/dL (ref 30.0–36.0)
MCV: 89 fL (ref 78.0–100.0)
MONOS PCT: 5 %
Monocytes Absolute: 0.3 10*3/uL (ref 0.1–1.0)
NEUTROS PCT: 59 %
Neutro Abs: 3.4 10*3/uL (ref 1.7–7.7)
Platelets: 231 10*3/uL (ref 150–400)
RBC: 4.17 MIL/uL (ref 3.87–5.11)
RDW: 15.5 % (ref 11.5–15.5)
WBC: 5.7 10*3/uL (ref 4.0–10.5)

## 2016-12-02 LAB — COMPREHENSIVE METABOLIC PANEL
ALBUMIN: 3.7 g/dL (ref 3.5–5.0)
ALT: 24 U/L (ref 14–54)
AST: 18 U/L (ref 15–41)
Alkaline Phosphatase: 60 U/L (ref 38–126)
Anion gap: 12 (ref 5–15)
BUN: 13 mg/dL (ref 6–20)
CHLORIDE: 102 mmol/L (ref 101–111)
CO2: 24 mmol/L (ref 22–32)
CREATININE: 0.91 mg/dL (ref 0.44–1.00)
Calcium: 8.9 mg/dL (ref 8.9–10.3)
GFR calc Af Amer: >60 mL/min (ref >60)
GLUCOSE: 114 mg/dL — AB (ref 65–99)
POTASSIUM: 3.6 mmol/L (ref 3.5–5.1)
SODIUM: 138 mmol/L (ref 135–145)
Total Bilirubin: 0.3 mg/dL (ref 0.3–1.2)
Total Protein: 7.2 g/dL (ref 6.5–8.1)

## 2016-12-02 LAB — RAPID URINE DRUG SCREEN, HOSP PERFORMED
AMPHETAMINES: NOT DETECTED
BENZODIAZEPINES: NOT DETECTED
Barbiturates: NOT DETECTED
Cocaine: POSITIVE — AB
Opiates: NOT DETECTED
TETRAHYDROCANNABINOL: NOT DETECTED

## 2016-12-02 LAB — I-STAT BETA HCG BLOOD, ED (MC, WL, AP ONLY): I-stat hCG, quantitative: <5 m[IU]/mL (ref <5)

## 2016-12-02 LAB — ACETAMINOPHEN LEVEL

## 2016-12-02 LAB — ETHANOL: ALCOHOL ETHYL (B): 37 mg/dL — AB (ref <5)

## 2016-12-02 LAB — SALICYLATE LEVEL: Salicylate Lvl: <7 mg/dL (ref 2.8–30.0)

## 2016-12-02 MED ORDER — ACETAMINOPHEN 325 MG PO TABS
650.0000 mg | ORAL_TABLET | ORAL | Status: DC | PRN
  Administered 2016-12-03: 650 mg via ORAL
  Filled 2016-12-02: qty 2

## 2016-12-02 MED ORDER — LORAZEPAM 2 MG/ML IJ SOLN: 0.0000 mg | Freq: Two times a day (BID) | INTRAMUSCULAR | Status: DC

## 2016-12-02 MED ORDER — VITAMIN B-1 100 MG PO TABS
100.0000 mg | ORAL_TABLET | Freq: Every day | ORAL | Status: DC
  Administered 2016-12-03: 100 mg via ORAL
  Filled 2016-12-02: qty 1

## 2016-12-02 MED ORDER — LORAZEPAM 1 MG PO TABS: 0.0000 mg | ORAL_TABLET | Freq: Two times a day (BID) | ORAL | Status: DC

## 2016-12-02 MED ORDER — ONDANSETRON HCL 4 MG PO TABS: 4.0000 mg | ORAL_TABLET | Freq: Three times a day (TID) | ORAL | Status: DC | PRN

## 2016-12-02 MED ORDER — DIVALPROEX SODIUM ER 500 MG PO TB24
500.0000 mg | ORAL_TABLET | Freq: Two times a day (BID) | ORAL | Status: DC
  Administered 2016-12-03 (×2): 500 mg via ORAL
  Filled 2016-12-02 (×2): qty 1

## 2016-12-02 MED ORDER — HYDROCHLOROTHIAZIDE 12.5 MG PO CAPS
12.5000 mg | ORAL_CAPSULE | Freq: Every day | ORAL | Status: DC
  Administered 2016-12-03: 12.5 mg via ORAL
  Filled 2016-12-02: qty 1

## 2016-12-02 MED ORDER — LISINOPRIL 5 MG PO TABS
5.0000 mg | ORAL_TABLET | Freq: Every day | ORAL | Status: DC
  Administered 2016-12-03: 5 mg via ORAL
  Filled 2016-12-02: qty 1

## 2016-12-02 MED ORDER — LORAZEPAM 2 MG/ML IJ SOLN: 0.0000 mg | Freq: Four times a day (QID) | INTRAMUSCULAR | Status: DC

## 2016-12-02 MED ORDER — LORAZEPAM 1 MG PO TABS
0.0000 mg | ORAL_TABLET | Freq: Four times a day (QID) | ORAL | Status: DC
Start: 2016-12-02 — End: 2016-12-03
  Administered 2016-12-03 (×2): 1 mg via ORAL
  Filled 2016-12-02 (×3): qty 1

## 2016-12-02 MED ORDER — PALIPERIDONE PALMITATE 234 MG/1.5ML IM SUSP: 234.0000 mg | INTRAMUSCULAR | Status: DC

## 2016-12-02 MED ORDER — TOPIRAMATE 25 MG PO TABS
50.0000 mg | ORAL_TABLET | Freq: Every day | ORAL | Status: DC
  Administered 2016-12-03 (×2): 50 mg via ORAL
  Filled 2016-12-02 (×2): qty 2

## 2016-12-02 MED ORDER — THIAMINE HCL 100 MG/ML IJ SOLN: 100.0000 mg | Freq: Every day | INTRAMUSCULAR | Status: DC

## 2016-12-02 MED ORDER — POTASSIUM CHLORIDE CRYS ER 10 MEQ PO TBCR
10.0000 meq | EXTENDED_RELEASE_TABLET | Freq: Every day | ORAL | Status: DC
  Administered 2016-12-03 (×2): 10 meq via ORAL
  Filled 2016-12-02 (×2): qty 1

## 2016-12-02 MED ORDER — ZOLPIDEM TARTRATE 5 MG PO TABS
5.0000 mg | ORAL_TABLET | Freq: Every evening | ORAL | Status: DC | PRN
  Administered 2016-12-03: 5 mg via ORAL
  Filled 2016-12-02: qty 1

## 2016-12-02 MED ORDER — RISPERIDONE 2 MG PO TABS
2.0000 mg | ORAL_TABLET | Freq: Two times a day (BID) | ORAL | Status: DC
  Administered 2016-12-03 (×2): 2 mg via ORAL
  Filled 2016-12-02 (×2): qty 1

## 2016-12-02 MED ORDER — ALUM & MAG HYDROXIDE-SIMETH 200-200-20 MG/5ML PO SUSP: 30.0000 mL | Freq: Four times a day (QID) | ORAL | Status: DC | PRN
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
## 2016-12-02 NOTE — ED Notes (Signed)
Bed: WA33 Expected date:  Expected time:  Means of arrival:  Comments: 

## 2016-12-02 NOTE — ED Notes (Signed)
Introduced self to patient. Pt oriented to unit expectations.  Assessed pt for:  A) Anxiety &/or agitation: When asked if she is suicidal pt said, "Hell no you motherfuckers."  I want to go home. She is loud and boisterous on the unit saying, "I'm going to sue you crazy motherfuckers."   S) Safety: Safety maintained with q-15-minute checks and hourly rounds by staff.  A) ADLs: Pt able to perform ADLs independently.  P) Pick-Up (room cleanliness): Pt's room clean and free of clutter.

## 2016-12-02 NOTE — ED Notes (Signed)
Refused medications. She said, "I don't want that shit you give me. I want some alcohol. We're going to have some crack tonight.

## 2016-12-02 NOTE — ED Notes (Signed)
Patient at the beginning of shift was loud and yelling on the phone talking to someone. Patient uses a foul language on staff trying to redirect her. Patient later went to her room and slept off. Patient continues to sleep at this time. On trying to wake patient up for her med, patient became irritated and refused the med. At this point, she is left to sleep till she wakes up. Will continue to monitor  patient.

## 2016-12-02 NOTE — ED Triage Notes (Signed)
Per EMS-states patient off her meds-states she was recently at [employer]-states she had her shot 2 weeks ago-states she has been drinking and occasionally uses crack

## 2016-12-02 NOTE — ED Provider Notes (Signed)
[facility]-EMERGENCY DEPT Provider Note   CSN: [id number] Arrival date & time: 12/02/16  1539     History   Chief Complaint Chief Complaint  Patient presents with  . Manic Behavior    HPI [name] is a 56 y.o. female.  HPI 56 year old female who presents by EMS with reported manic behavior. She has history of schizoaffective schizophrenia, bipolar disorder, and polysubstance abuse. Per EMS, patient has been off of her psychiatric medications. Recently under inpatient psychiatric treatment at [facility]. She reports she has been drinking daily, "too much" and using crack cocaine. Denies SI, but states she has HI and wants to kill anyone who bothers her. When questioned further, she states she has no one in mind that she wants to hurt. States she is hallucinating, visual hallucination of people murdering other people. She reports that she talks to [facility] when this happens, who will help her. Does endorse etoh and cocaine abuse today, just prior to arrival. Also reports left ankle pain and thinks she may have broken her ankle because she has been walking on it for so long. Denies any falls or trauma.   Past Medical History:  Diagnosis Date  . Arthritis   . Bipolar 1 disorder (HCC)   . Obesity   . Psychiatric illness   . Schizo-affective schizophrenia (HCC)   . Seizures (HCC)   . Substance abuse     Patient Active Problem List   Diagnosis Date Noted  . Substance induced mood disorder (HCC) 12/27/2015  . Fever, unspecified 09/13/2015  . Leukocytosis 09/13/2015  . Acute encephalopathy 09/13/2015  . Substance abuse 09/12/2015  . Hallucination   . Homicidal ideation   . Schizophrenia, unspecified type (HCC)   . Schizoaffective disorder, bipolar type (HCC)   . Schizoaffective disorder (HCC) 03/29/2013  . Cocaine abuse 06/30/2011  . Non-compliant patient 06/29/2011  . Hypertension 06/29/2011  . Alcohol dependence (HCC) 06/28/2011  . Polysubstance dependence (HCC) 04/03/2011  .  Polysubstance abuse 06/21/2010  . Schizophrenia (HCC) 06/21/2010    Past Surgical History:  Procedure Laterality Date  . STOMACH SURGERY      OB History    No data available       Home Medications    Prior to Admission medications   Medication Sig Start Date End Date Taking? Authorizing Provider  benztropine (COGENTIN) 0.5 MG tablet Take 0.5 mg by mouth. 09/16/16 09/16/17 Yes [provider]  risperiDONE (RISPERDAL) 2 MG tablet 1 in a m 2 at hs 09/16/16  Yes [provider]  topiramate (TOPAMAX) 50 MG tablet Take 50 mg by mouth. 09/16/16  Yes [provider]  traZODone (DESYREL) 50 MG tablet Take 50 mg by mouth. 09/16/16  Yes [provider]  divalproex (DEPAKOTE ER) 500 MG 24 hr tablet Take 1 tablet (500 mg total) by mouth 2 (two) times daily. 12/28/15   Thermon Leyland, NP  hydrochlorothiazide (MICROZIDE) 12.5 MG capsule Take 1 capsule (12.5 mg total) by mouth daily. 12/28/15   Thermon Leyland, NP  lisinopril (PRINIVIL,ZESTRIL) 5 MG tablet Take 1 tablet (5 mg total) by mouth daily. 12/28/15   Thermon Leyland, NP  paliperidone (INVEGA SUSTENNA) 156 MG/ML SUSP injection Inject 1 mL (156 mg total) into the muscle every 30 (thirty) days. 12/28/15   Thermon Leyland, NP  potassium chloride (K-DUR,KLOR-CON) 10 MEQ tablet Take 1 tablet (10 mEq total) by mouth daily. 12/28/15   Thermon Leyland, NP  risperiDONE (RISPERDAL) 2 MG tablet Take 1  tablet (2 mg total) by mouth 2 (two) times daily. 12/28/15   Thermon Leyland, NP    Family History Family History  Problem Relation Age of Onset  . Alzheimer's disease Mother   . Depression Mother     Social History Social History  Substance Use Topics  . Smoking status: Current Every Day Smoker    Packs/day: 1.00    Types: Cigarettes  . Smokeless tobacco: Never Used  . Alcohol use 3.6 oz/week    6 Cans of beer per week     Allergies   Patient has no known allergies.   Review of Systems Review of Systems    Constitutional: Negative for fever.  Respiratory: Negative for shortness of breath.   Cardiovascular: Negative for chest pain.  Gastrointestinal: Negative for abdominal pain and vomiting.  Musculoskeletal:       Left ankle pain   Skin: Negative for wound.  All other systems reviewed and are negative.    Physical Exam Updated Vital Signs BP (!) 164/99 (BP Location: Right Arm)   Pulse 100   Temp 98 F (36.7 C) (Oral)   Resp 20   LMP 01/13/2012   SpO2 100%   Physical Exam Physical Exam  Nursing note and vitals reviewed. Constitutional: Well developed, well nourished, non-toxic, and in no acute distress Head: Normocephalic and atraumatic.  Mouth/Throat: Oropharynx is clear and moist.  Neck: Normal range of motion. Neck supple.  Cardiovascular: Normal rate and regular rhythm.   Pulmonary/Chest: Effort normal and breath sounds normal.  Abdominal: Soft. There is no tenderness. There is no rebound and no guarding.  Musculoskeletal: Normal range of motion. Mild swelling of left ankle. Neurological: Alert, no facial droop, moves all extremities symmetrically, normal non-ataxic gait Skin: Skin is warm and dry.  Psychiatric: Cooperative   ED Treatments / Results  Labs (all labs ordered are listed, but only abnormal results are displayed) Labs Reviewed  COMPREHENSIVE METABOLIC PANEL - Abnormal; Notable for the following:       Result Value   Glucose, Bld 114 (*)    All other components within normal limits  ETHANOL - Abnormal; Notable for the following:    Alcohol, Ethyl (B) 37 (*)    All other components within normal limits  RAPID URINE DRUG SCREEN, HOSP PERFORMED - Abnormal; Notable for the following:    Cocaine POSITIVE (*)    All other components within normal limits  ACETAMINOPHEN LEVEL - Abnormal; Notable for the following:    Acetaminophen (Tylenol), Serum <10 (*)    All other components within normal limits  CBC WITH DIFFERENTIAL/PLATELET  SALICYLATE LEVEL   I-STAT BETA HCG BLOOD, ED ([facility], [facility], [facility] ONLY)    EKG  EKG Interpretation None       Radiology Dg Ankle Complete Left  Result Date: 12/02/2016 CLINICAL DATA:  Left ankle pain after fall. EXAM: LEFT ANKLE COMPLETE - 3+ VIEW COMPARISON:  None. FINDINGS: There is no evidence of fracture, dislocation, or joint effusion. There is no evidence of arthropathy or other focal bone abnormality. Soft tissues are unremarkable. IMPRESSION: No significant abnormality seen in the left ankle. Electronically Signed   By: Lupita Raider, M.D.   On: 12/02/2016 16:51    Procedures Procedures (including critical care time)  Medications Ordered in ED Medications  LORazepam (ATIVAN) injection 0-4 mg ( Intravenous See Alternative 12/02/16 1849)    Or  LORazepam (ATIVAN) tablet 0-4 mg (0 mg Oral Refused 12/02/16 1849)  LORazepam (ATIVAN) injection 0-4 mg (not  administered)    Or  LORazepam (ATIVAN) tablet 0-4 mg (not administered)  thiamine (VITAMIN B-1) tablet 100 mg ( Oral See Alternative 12/02/16 1849)    Or  thiamine (B-1) injection 100 mg (100 mg Intravenous Refused 12/02/16 1849)  acetaminophen (TYLENOL) tablet 650 mg (not administered)  zolpidem (AMBIEN) tablet 5 mg (not administered)  ondansetron (ZOFRAN) tablet 4 mg (not administered)  alum & mag hydroxide-simeth (MAALOX/MYLANTA) 200-200-20 MG/5ML suspension 30 mL (not administered)  divalproex (DEPAKOTE ER) 24 hr tablet 500 mg (not administered)  hydrochlorothiazide (MICROZIDE) capsule 12.5 mg (not administered)  lisinopril (PRINIVIL,ZESTRIL) tablet 5 mg (not administered)  paliperidone (INVEGA SUSTENNA) injection 156 mg (not administered)  potassium chloride (K-DUR,KLOR-CON) CR tablet 10 mEq (not administered)  risperiDONE (RISPERDAL) tablet 2 mg (not administered)  topiramate (TOPAMAX) tablet 50 mg (not administered)     Initial Impression / Assessment and Plan / ED Course  I have reviewed the triage vital signs and the nursing  notes.  Pertinent labs & imaging results that were available during my care of the patient were reviewed by me and considered in my medical decision making (see chart for details).     Brought in by EMS for concern for manic behavior and medication noncompliance. Does appear intoxicated on my evaluation. Rambles, goes into some tangential thoughts, but then redirected to answer questions. She states she wants to go to [facility]. Exam otherwise non-focal. Will obtain medical screening blood work and XR of the left ankle due to complaints of pain and swelling.   XR negative. Blood work reassuring.  Medically cleared for TTS consult.   TSS recommending inpatient psych treatment. Psych holding orders placed.   Final Clinical Impressions(s) / ED Diagnoses   Final diagnoses:  Psychiatric problem    New Prescriptions New Prescriptions   No medications on file     Lavera Guise, MD 12/02/16 2043

## 2016-12-03 ENCOUNTER — Encounter (HOSPITAL_COMMUNITY): Payer: Self-pay | Admitting: Registered Nurse

## 2016-12-03 DIAGNOSIS — F99 Mental disorder, not otherwise specified: Secondary | ICD-10-CM

## 2016-12-03 DIAGNOSIS — R450 Nervousness: Secondary | ICD-10-CM

## 2016-12-03 DIAGNOSIS — R4587 Impulsiveness: Secondary | ICD-10-CM | POA: Diagnosis not present

## 2016-12-03 DIAGNOSIS — F1994 Other psychoactive substance use, unspecified with psychoactive substance-induced mood disorder: Secondary | ICD-10-CM | POA: Diagnosis not present

## 2016-12-03 DIAGNOSIS — F17210 Nicotine dependence, cigarettes, uncomplicated: Secondary | ICD-10-CM

## 2016-12-03 DIAGNOSIS — Z810 Family history of intellectual disabilities: Secondary | ICD-10-CM | POA: Diagnosis not present

## 2016-12-03 DIAGNOSIS — F1410 Cocaine abuse, uncomplicated: Secondary | ICD-10-CM

## 2016-12-03 DIAGNOSIS — Z818 Family history of other mental and behavioral disorders: Secondary | ICD-10-CM | POA: Diagnosis not present

## 2016-12-03 DIAGNOSIS — F250 Schizoaffective disorder, bipolar type: Secondary | ICD-10-CM | POA: Diagnosis not present

## 2016-12-03 DIAGNOSIS — F419 Anxiety disorder, unspecified: Secondary | ICD-10-CM

## 2016-12-03 LAB — URINALYSIS, ROUTINE W REFLEX MICROSCOPIC
BILIRUBIN URINE: NEGATIVE
GLUCOSE, UA: NEGATIVE mg/dL
Hgb urine dipstick: NEGATIVE
Ketones, ur: NEGATIVE mg/dL
Leukocytes, UA: NEGATIVE
NITRITE: NEGATIVE
PH: 7 (ref 5.0–8.0)
Protein, ur: NEGATIVE mg/dL
SPECIFIC GRAVITY, URINE: 1.012 (ref 1.005–1.030)

## 2016-12-03 MED ORDER — TRAZODONE HCL 100 MG PO TABS: 100.0000 mg | ORAL_TABLET | Freq: Every evening | ORAL | Status: DC | PRN

## 2016-12-03 MED ORDER — PALIPERIDONE PALMITATE 234 MG/1.5ML IM SUSP: 234.0000 mg | INTRAMUSCULAR | Status: DC

## 2016-12-03 NOTE — ED Notes (Signed)
Medications reviewed by Dr Lenore Cordia, pharmacy tech into see and pt declined to talk with her.

## 2016-12-03 NOTE — ED Notes (Addendum)
Up to the bathroom, pt reports that she has a UTI, and has not been treated for it. Pt also reports that she is not on depakote, and is not sure what medications she takes, waiting for pharmacy to complete med rec. Will contact pharmacy again.

## 2016-12-03 NOTE — ED Notes (Signed)
Registration into talk w/ pt.

## 2016-12-03 NOTE — ED Notes (Signed)
Patient irritated this morning after she requested for a hair brush to brush her hair and was offered a comb and a tooth brush. Patient threatens and cursed out staff. Offered and accepted PO of Ativan 1 mg. In her room at this time.  Will continue to monitor patient.

## 2016-12-03 NOTE — ED Notes (Signed)
Dr Lenore Cordia and Denice Bors NP into see,  Pt denies si/hi/avh at this time.  Pt reports she works with [employer], and lives with a older man who helps take care of her and that house.

## 2016-12-03 NOTE — ED Notes (Signed)
ACT team is here to pick up pt.

## 2016-12-03 NOTE — ED Notes (Signed)
Pharmacy contacted for completion of med rec.

## 2016-12-03 NOTE — ED Notes (Signed)
Patient awake. Accepted her bedtime meds

## 2016-12-03 NOTE — Consult Note (Signed)
[place] Psychiatry Consult   Reason for Consult:  Agitation Referring Physician:  EDP Patient Identification: [name] MRN:  [id number] Principal Diagnosis: Schizoaffective disorder, bipolar type [facility]) Diagnosis:   Patient Active Problem List   Diagnosis Date Noted  . Substance induced mood disorder ([place]) [F19.94] 12/27/2015  . Fever, unspecified [R50.9] 09/13/2015  . Leukocytosis [D72.829] 09/13/2015  . Acute encephalopathy [G93.40] 09/13/2015  . Substance abuse [F19.10] 09/12/2015  . Hallucination [R44.3]   . Homicidal ideation [R45.850]   . Schizophrenia, unspecified type ([place]) [F20.9]   . Schizoaffective disorder, bipolar type ([place]) [F25.0]   . Schizoaffective disorder ([place]) [F25.9] 03/29/2013  . Cocaine abuse [F14.10] 06/30/2011  . Non-compliant patient [Z91.19] 06/29/2011  . Hypertension [I10] 06/29/2011  . Alcohol dependence ([place]) [F10.20] 06/28/2011  . Polysubstance dependence ([place]) [F19.20] 04/03/2011  . Polysubstance abuse [F19.10] 06/21/2010  . Schizophrenia ([place]) [F20.9] 06/21/2010    Total Time spent with patient: 45 minutes  Subjective:   [name] is a 56 y.o. female patient voluntary presented to [facility] with complaints of not feeling well.  HPI:  patient seen by Dr. Darleene Cleaver and this provider.  Chart reviewed and face to face evaluation on 12/03/16.   On evaluation:  [name] reports that she was home drinking beer with her boyfriend and she started to feel unwell.  States she came to the hospital because she wasn't feeling well and thought it was her blood pressure.  "I didn't try to kill myself and I ain't gone kill nobody I was mad cause they told me I couldn't go home last night."  Patient denies suicidal/homicidal ideation, psychosis, and paranoia.  Patient states that she lives with her boyfriend who helps her; states that she has kept her outpatient appointments, has ACTT team, and has been compliant with her medications.  During evaluation  patient calm/cooperative, oriented x 4, patient does not appear to be responding to internal/external stimuli.    Past Psychiatric History: Schizoaffective disorder; substance induced mood disorder, cocaine abuse  Risk to Self: Suicidal Ideation: No Suicidal Intent: No Is patient at risk for suicide?: No Suicidal Plan?: No Access to Means: No What has been your use of drugs/alcohol within the last 12 months?: PT ENDORSES ALCOHOL AND COCAINE USE  Triggers for Past Attempts: Unknown Intentional Self Injurious Behavior:  (UTA) Risk to Others: Homicidal Ideation: No Thoughts of Harm to Others: No Comment - Thoughts of Harm to Others: pt making threats in ED to kill staff  Current Homicidal Intent: No Current Homicidal Plan: No Access to Homicidal Means: No Identified Victim: pt states "anyone that pisses me off" History of harm to others?:  (UNKNOWN) Assessment of Violence: On admission Violent Behavior Description: Pt making threats to throw tables at ED staff Does patient have access to weapons?:  (unknown) Criminal Charges Pending?:  (unknown) Does patient have a court date:  (unknown) Prior Inpatient Therapy: Prior Inpatient Therapy: Yes Prior Therapy Dates: 2017, 2013, 2012 Prior Therapy Facilty/Provider(s): [facility], [facility] and other facilities Reason for Treatment: Schizophrenia, schizoaffective d/o, substance abuse  Prior Outpatient Therapy: Prior Outpatient Therapy:  (unknown) Does patient have an ACCT team?: Unknown Does patient have Intensive In-House Services?  : Unknown Does patient have [facility] services? : Unknown Does patient have P4CC services?: Unknown  Past Medical History:  Past Medical History:  Diagnosis Date  . Arthritis   . Bipolar 1 disorder ([place])   . Obesity   . Psychiatric illness   . Schizo-affective schizophrenia ([place])   . Seizures ([place])   .  Substance abuse     Past Surgical History:  Procedure Laterality Date  . STOMACH SURGERY     Family History:   Family History  Problem Relation Age of Onset  . Alzheimer's disease Mother   . Depression Mother    Family Psychiatric  History: Mother/depression Social History:  History  Alcohol Use  . 3.6 oz/week  . 6 Cans of beer per week     History  Drug Use  . Frequency: 5.0 times per week  . Types: "Crack" cocaine, Cocaine    Comment: crack/cocaine    Social History   Social History  . Marital status: Single    Spouse name: N/A  . Number of children: N/A  . Years of education: N/A   Social History Main Topics  . Smoking status: Current Every Day Smoker    Packs/day: 1.00    Types: Cigarettes  . Smokeless tobacco: Never Used  . Alcohol use 3.6 oz/week    6 Cans of beer per week  . Drug use: Yes    Frequency: 5.0 times per week    Types: "Crack" cocaine, Cocaine     Comment: crack/cocaine  . Sexual activity: No   Other Topics Concern  . None   Social History Narrative  . None   Additional Social History:    Allergies:  No Known Allergies  Labs:  Results for orders placed or performed during the hospital encounter of 12/02/16 (from the past 48 hour(s))  Urine rapid drug screen (hosp performed)     Status: Abnormal   Collection Time: 12/02/16  3:51 PM  Result Value Ref Range   Opiates NONE DETECTED NONE DETECTED   Cocaine POSITIVE (A) NONE DETECTED   Benzodiazepines NONE DETECTED NONE DETECTED   Amphetamines NONE DETECTED NONE DETECTED   Tetrahydrocannabinol NONE DETECTED NONE DETECTED   Barbiturates NONE DETECTED NONE DETECTED    Comment:        DRUG SCREEN FOR MEDICAL PURPOSES ONLY.  IF CONFIRMATION IS NEEDED FOR ANY PURPOSE, NOTIFY LAB WITHIN 5 DAYS.        LOWEST DETECTABLE LIMITS FOR URINE DRUG SCREEN Drug Class       Cutoff (ng/mL) Amphetamine      1000 Barbiturate      200 Benzodiazepine   200 Tricyclics       300 Opiates          300 Cocaine          300 THC              50   Comprehensive metabolic panel     Status: Abnormal   Collection  Time: 12/02/16  4:21 PM  Result Value Ref Range   Sodium 138 135 - 145 mmol/L   Potassium 3.6 3.5 - 5.1 mmol/L   Chloride 102 101 - 111 mmol/L   CO2 24 22 - 32 mmol/L   Glucose, Bld 114 (H) 65 - 99 mg/dL   BUN 13 6 - 20 mg/dL   Creatinine, Ser 5.89 0.44 - 1.00 mg/dL   Calcium 8.9 8.9 - 42.3 mg/dL   Total Protein 7.2 6.5 - 8.1 g/dL   Albumin 3.7 3.5 - 5.0 g/dL   AST 18 15 - 41 U/L   ALT 24 14 - 54 U/L   Alkaline Phosphatase 60 38 - 126 U/L   Total Bilirubin 0.3 0.3 - 1.2 mg/dL   GFR calc non Af Amer >60 >60 mL/min   GFR calc Af Amer >60 >60 mL/min  Comment: (NOTE) The eGFR has been calculated using the CKD EPI equation. This calculation has not been validated in all clinical situations. eGFR's persistently <60 mL/min signify possible Chronic Kidney Disease.    Anion gap 12 5 - 15  CBC with Diff     Status: None   Collection Time: 12/02/16  4:21 PM  Result Value Ref Range   WBC 5.7 4.0 - 10.5 K/uL   RBC 4.17 3.87 - 5.11 MIL/uL   Hemoglobin 12.1 12.0 - 15.0 g/dL   HCT 21.8 19.6 - 82.7 %   MCV 89.0 78.0 - 100.0 fL   MCH 29.0 26.0 - 34.0 pg   MCHC 32.6 30.0 - 36.0 g/dL   RDW 99.6 57.3 - 80.1 %   Platelets 231 150 - 400 K/uL   Neutrophils Relative % 59 %   Neutro Abs 3.4 1.7 - 7.7 K/uL   Lymphocytes Relative 34 %   Lymphs Abs 1.9 0.7 - 4.0 K/uL   Monocytes Relative 5 %   Monocytes Absolute 0.3 0.1 - 1.0 K/uL   Eosinophils Relative 1 %   Eosinophils Absolute 0.0 0.0 - 0.7 K/uL   Basophils Relative 1 %   Basophils Absolute 0.0 0.0 - 0.1 K/uL  Ethanol     Status: Abnormal   Collection Time: 12/02/16  4:22 PM  Result Value Ref Range   Alcohol, Ethyl (B) 37 (H) <5 mg/dL    Comment:        LOWEST DETECTABLE LIMIT FOR SERUM ALCOHOL IS 5 mg/dL FOR MEDICAL PURPOSES ONLY   Salicylate level     Status: None   Collection Time: 12/02/16  4:22 PM  Result Value Ref Range   Salicylate Lvl <7.0 2.8 - 30.0 mg/dL  Acetaminophen level     Status: Abnormal   Collection Time:  12/02/16  4:22 PM  Result Value Ref Range   Acetaminophen (Tylenol), Serum <10 (L) 10 - 30 ug/mL    Comment:        THERAPEUTIC CONCENTRATIONS VARY SIGNIFICANTLY. A RANGE OF 10-30 ug/mL MAY BE AN EFFECTIVE CONCENTRATION FOR MANY PATIENTS. HOWEVER, SOME ARE BEST TREATED AT CONCENTRATIONS OUTSIDE THIS RANGE. ACETAMINOPHEN CONCENTRATIONS >150 ug/mL AT 4 HOURS AFTER INGESTION AND >50 ug/mL AT 12 HOURS AFTER INGESTION ARE OFTEN ASSOCIATED WITH TOXIC REACTIONS.   I-Stat beta hCG blood, ED     Status: None   Collection Time: 12/02/16  4:31 PM  Result Value Ref Range   I-stat hCG, quantitative <5.0 <5 mIU/mL   Comment 3            Comment:   GEST. AGE      CONC.  (mIU/mL)   <=1 WEEK        5 - 50     2 WEEKS       50 - 500     3 WEEKS       100 - 10,000     4 WEEKS     1,000 - 30,000        FEMALE AND NON-PREGNANT FEMALE:     LESS THAN 5 mIU/mL   Urinalysis, Routine w reflex microscopic     Status: None   Collection Time: 12/03/16 10:53 AM  Result Value Ref Range   Color, Urine YELLOW YELLOW   APPearance CLEAR CLEAR   Specific Gravity, Urine 1.012 1.005 - 1.030   pH 7.0 5.0 - 8.0   Glucose, UA NEGATIVE NEGATIVE mg/dL   Hgb urine dipstick NEGATIVE NEGATIVE   Bilirubin Urine  NEGATIVE NEGATIVE   Ketones, ur NEGATIVE NEGATIVE mg/dL   Protein, ur NEGATIVE NEGATIVE mg/dL   Nitrite NEGATIVE NEGATIVE   Leukocytes, UA NEGATIVE NEGATIVE    Current Facility-Administered Medications  Medication Dose Route Frequency Provider Last Rate Last Dose  . acetaminophen (TYLENOL) tablet 650 mg  650 mg Oral Q4H PRN Forde Dandy, MD   650 mg at 12/03/16 1103  . alum & mag hydroxide-simeth (MAALOX/MYLANTA) 200-200-20 MG/5ML suspension 30 mL  30 mL Oral Q6H PRN Forde Dandy, MD      . divalproex (DEPAKOTE ER) 24 hr tablet 500 mg  500 mg Oral BID Forde Dandy, MD   500 mg at 12/03/16 1121  . hydrochlorothiazide (MICROZIDE) capsule 12.5 mg  12.5 mg Oral Daily Forde Dandy, MD   12.5 mg at 12/03/16  1151  . lisinopril (PRINIVIL,ZESTRIL) tablet 5 mg  5 mg Oral Daily Forde Dandy, MD   5 mg at 12/03/16 1152  . ondansetron (ZOFRAN) tablet 4 mg  4 mg Oral Q8H PRN Forde Dandy, MD      . paliperidone [facility] SUSTENNA) injection 156 mg  156 mg Intramuscular Q30 days Forde Dandy, MD      . potassium chloride (K-DUR,KLOR-CON) CR tablet 10 mEq  10 mEq Oral Daily Forde Dandy, MD   10 mEq at 12/03/16 1121  . risperiDONE (RISPERDAL) tablet 2 mg  2 mg Oral BID Forde Dandy, MD   2 mg at 12/03/16 1152  . thiamine (VITAMIN B-1) tablet 100 mg  100 mg Oral Daily Forde Dandy, MD   100 mg at 12/03/16 1122   Or  . thiamine (B-1) injection 100 mg  100 mg Intravenous Daily Forde Dandy, MD      . topiramate (TOPAMAX) tablet 50 mg  50 mg Oral Daily Forde Dandy, MD   50 mg at 12/03/16 1122  . traZODone (DESYREL) tablet 100 mg  100 mg Oral QHS PRN Corena Pilgrim, MD       Current Outpatient Prescriptions  Medication Sig Dispense Refill  . benztropine (COGENTIN) 0.5 MG tablet Take 0.5 mg by mouth.    . risperiDONE (RISPERDAL) 2 MG tablet 1 in a m 2 at hs    . topiramate (TOPAMAX) 50 MG tablet Take 50 mg by mouth.    . traZODone (DESYREL) 50 MG tablet Take 50 mg by mouth.    . divalproex (DEPAKOTE ER) 500 MG 24 hr tablet Take 1 tablet (500 mg total) by mouth 2 (two) times daily.    . hydrochlorothiazide (MICROZIDE) 12.5 MG capsule Take 1 capsule (12.5 mg total) by mouth daily. 30 capsule 0  . lisinopril (PRINIVIL,ZESTRIL) 5 MG tablet Take 1 tablet (5 mg total) by mouth daily. 30 tablet 0  . paliperidone (INVEGA SUSTENNA) 156 MG/ML SUSP injection Inject 1 mL (156 mg total) into the muscle every 30 (thirty) days. 0.9 mL   . potassium chloride (K-DUR,KLOR-CON) 10 MEQ tablet Take 1 tablet (10 mEq total) by mouth daily. 30 tablet 0  . risperiDONE (RISPERDAL) 2 MG tablet Take 1 tablet (2 mg total) by mouth 2 (two) times daily. 60 tablet 0    Musculoskeletal: Strength & Muscle Tone: within normal  limits Gait & Station: normal Patient leans: N/A  Psychiatric Specialty Exam: Physical Exam  Constitutional: She is oriented to person, place, and time.  Neck: Normal range of motion.  Respiratory: Effort normal.  Musculoskeletal: Normal range of motion.  Neurological: She is alert  and oriented to person, place, and time.  Psychiatric: Her behavior is normal. Her mood appears anxious. Thought content is not paranoid and not delusional. She expresses impulsivity. She expresses no homicidal and no suicidal ideation. She exhibits abnormal remote memory.    Review of Systems  Psychiatric/Behavioral: Positive for substance abuse. Negative for suicidal ideas (denies). Depression: Stable. Hallucinations: denies. The patient is nervous/anxious (stable).   All other systems reviewed and are negative.   Blood pressure (!) 152/89, pulse 98, temperature 98.7 F (37.1 C), temperature source Oral, resp. rate 16, last menstrual period 01/13/2012, SpO2 98 %.There is no height or weight on file to calculate BMI.  General Appearance: Casual  Eye Contact:  Good  Speech:  Garbled  Volume:  Increased  Mood:  "Good ready to go home"  Affect:  Congruent  Thought Process:  Goal Directed  Orientation:  Full (Time, Place, and Person)  Thought Content:  Denies hallucinations, delusions, and paranoia  Suicidal Thoughts:  No  Homicidal Thoughts:  No  Memory:  Immediate;   Good Recent;   Good Remote;   Good  Judgement:  Fair  Insight:  Fair  Psychomotor Activity:  Normal  Concentration:  Concentration: Fair and Attention Span: Fair  Recall:  Good  Fund of Knowledge:  Fair  Language:  Good  Akathisia:  No  Handed:  Right  AIMS (if indicated):     Assets:  Desire for Improvement Housing Social Support  ADL's:  Intact  Cognition:  WNL  Sleep:        Treatment Plan Summary: Plan Discharge home; follow up with ACTT team and primary psyciatric provider  Disposition: No evidence of imminent risk to  self or others at present.   Patient does not meet criteria for psychiatric inpatient admission.  Rankin, Delphia Grates, NP 12/03/2016 12:04 PM  Patient seen face-to-face for psychiatric evaluation, chart reviewed and case discussed with the physician extender and developed treatment plan. Reviewed the information documented and agree with the treatment plan. Corena Pilgrim, MD

## 2016-12-03 NOTE — ED Notes (Signed)
Attempted to review written dc instructions with patient. Pt had to be encouraged to listen to instructions at times.  Pt encouraged to take her medications as directed, follow up with [facility], and avoid alcohol and drugs.  Pt also encouraged to seek treatment/call crisis line for any return of suicidal/homicidal thougths or urges.  Pt verbalized understanding.

## 2016-12-03 NOTE — ED Notes (Signed)
Up to the bathroom 

## 2016-12-03 NOTE — [facility] Suicide Risk Assessment (Cosign Needed)
Suicide Risk Assessment  Discharge Assessment   [facility] Discharge Suicide Risk Assessment   Principal Problem: Schizoaffective disorder, bipolar type [place]) Discharge Diagnoses:  Patient Active Problem List   Diagnosis Date Noted  . Substance induced mood disorder ([facility]) [F19.94] 12/27/2015  . Fever, unspecified [R50.9] 09/13/2015  . Leukocytosis [D72.829] 09/13/2015  . Acute encephalopathy [G93.40] 09/13/2015  . Substance abuse [F19.10] 09/12/2015  . Hallucination [R44.3]   . Homicidal ideation [R45.850]   . Schizophrenia, unspecified type ([facility]) [F20.9]   . Schizoaffective disorder, bipolar type ([facility]) [F25.0]   . Schizoaffective disorder ([facility]) [F25.9] 03/29/2013  . Cocaine abuse [F14.10] 06/30/2011  . Non-compliant patient [Z91.19] 06/29/2011  . Hypertension [I10] 06/29/2011  . Alcohol dependence ([facility]) [F10.20] 06/28/2011  . Polysubstance dependence ([facility]) [F19.20] 04/03/2011  . Psychiatric problem [F99] 04/03/2011  . Polysubstance abuse [F19.10] 06/21/2010  . Schizophrenia ([facility]) [F20.9] 06/21/2010    Total Time spent with patient: 30 minutes  Musculoskeletal: Strength & Muscle Tone: within normal limits Gait & Station: normal Patient leans: N/A  Psychiatric Specialty Exam:   Blood pressure (!) 152/89, pulse 98, temperature 98.7 F (37.1 C), temperature source Oral, resp. rate 16, last menstrual period 01/13/2012, SpO2 98 %.There is no height or weight on file to calculate BMI.   General Appearance: Casual  Eye Contact:  Good  Speech:  Garbled  Volume:  Increased  Mood:  "Good ready to go home"  Affect:  Congruent  Thought Process:  Goal Directed  Orientation:  Full (Time, Place, and Person)  Thought Content:  Denies hallucinations, delusions, and paranoia  Suicidal Thoughts:  No  Homicidal Thoughts:  No  Memory:  Immediate;   Good Recent;   Good Remote;   Good  Judgement:  Fair  Insight:  Fair  Psychomotor Activity:  Normal  Concentration:  Concentration: Fair and  Attention Span: Fair  Recall:  Good  Fund of Knowledge:  Fair  Language:  Good  Akathisia:  No  Handed:  Right  AIMS (if indicated):     Assets:  Desire for Improvement Housing Social Support  ADL's:  Intact  Cognition:  WNL  Sleep:         Mental Status Per Nursing Assessment::   On Admission:     Demographic Factors:  NA  Loss Factors: NA  Historical Factors: Impulsivity  Risk Reduction Factors:   Positive social support and Positive therapeutic relationship  Continued Clinical Symptoms:  Previous Psychiatric Diagnoses and Treatments  Cognitive Features That Contribute To Risk:  None    Suicide Risk:  Minimal: No identifiable suicidal ideation.  Patients presenting with no risk factors but with morbid ruminations; may be classified as minimal risk based on the severity of the depressive symptoms    Plan Of Care/Follow-up recommendations:  Activity:  As tolerated Diet:  As tolerated  Rankin, [place], NP 12/03/2016, 12:41 PM

## 2016-12-03 NOTE — ED Notes (Addendum)
Pt ambulatory w/o difficulty to dc area with this RN.  Belongings returned after leaving the unit.  Pt requested to return home in scrubs.  Ashley (ACT) meet patient on arrival to dc area  dc and is driving her home.

## 2016-12-03 NOTE — ED Notes (Addendum)
Pt is aware that [facility] will be here shortly to pick her up.  Pt irritable that they are not here yet.

## 2016-12-03 NOTE — [facility] Counselor (Addendum)
Writer left voicemail for [facility] [phone] re: pt being psychiatrically cleared. Pt is ready to be d/c. Writer left TTS # [phone]. Writer received call back from [place] from [facility]. She reports she will pick up pt between 1330 and 1400 today.   Evette Cristal, [place] Therapeutic Triage Specialist

## 2016-12-03 NOTE — ED Notes (Signed)
Up to the bathroom, nad.  PT is aware that the MD will be in this morning to evaluate her.

## 2017-07-01 ENCOUNTER — Other Ambulatory Visit: Payer: Self-pay

## 2017-07-01 ENCOUNTER — Encounter (HOSPITAL_COMMUNITY): Payer: Self-pay | Admitting: Emergency Medicine

## 2017-07-01 ENCOUNTER — Emergency Department (HOSPITAL_COMMUNITY)
Admission: EM | Admit: 2017-07-01 | Discharge: 2017-07-01 | Discharge status: Against Medical Advice | Payer: Medicare Other | Attending: Emergency Medicine | Admitting: Emergency Medicine

## 2017-07-01 DIAGNOSIS — Z79899 Other long term (current) drug therapy: Secondary | ICD-10-CM | POA: Diagnosis not present

## 2017-07-01 DIAGNOSIS — F1910 Other psychoactive substance abuse, uncomplicated: Secondary | ICD-10-CM | POA: Insufficient documentation

## 2017-07-01 DIAGNOSIS — I10 Essential (primary) hypertension: Secondary | ICD-10-CM | POA: Diagnosis not present

## 2017-07-01 DIAGNOSIS — F17210 Nicotine dependence, cigarettes, uncomplicated: Secondary | ICD-10-CM | POA: Insufficient documentation

## 2017-07-01 HISTORY — DX: Essential (primary) hypertension: I10

## 2017-07-01 LAB — URINALYSIS, ROUTINE W REFLEX MICROSCOPIC
Bilirubin Urine: NEGATIVE
Glucose, UA: NEGATIVE mg/dL
KETONES UR: NEGATIVE mg/dL
Nitrite: NEGATIVE
PH: 6 (ref 5.0–8.0)
Protein, ur: NEGATIVE mg/dL
Specific Gravity, Urine: 1.011 (ref 1.005–1.030)

## 2017-07-01 LAB — COMPREHENSIVE METABOLIC PANEL
ALBUMIN: 3.8 g/dL (ref 3.5–5.0)
ALT: 23 U/L (ref 14–54)
AST: 17 U/L (ref 15–41)
Alkaline Phosphatase: 54 U/L (ref 38–126)
Anion gap: 10 (ref 5–15)
BUN: 16 mg/dL (ref 6–20)
CHLORIDE: 104 mmol/L (ref 101–111)
CO2: 27 mmol/L (ref 22–32)
CREATININE: 0.64 mg/dL (ref 0.44–1.00)
Calcium: 8.9 mg/dL (ref 8.9–10.3)
GFR calc Af Amer: >60 mL/min (ref >60)
GFR calc non Af Amer: >60 mL/min (ref >60)
GLUCOSE: 133 mg/dL — AB (ref 65–99)
POTASSIUM: 3.9 mmol/L (ref 3.5–5.1)
SODIUM: 141 mmol/L (ref 135–145)
Total Bilirubin: 0.7 mg/dL (ref 0.3–1.2)
Total Protein: 7.5 g/dL (ref 6.5–8.1)

## 2017-07-01 LAB — CBC
HEMATOCRIT: 42.1 % (ref 36.0–46.0)
HEMOGLOBIN: 13.4 g/dL (ref 12.0–15.0)
MCH: 29.1 pg (ref 26.0–34.0)
MCHC: 31.8 g/dL (ref 30.0–36.0)
MCV: 91.3 fL (ref 78.0–100.0)
Platelets: 219 10*3/uL (ref 150–400)
RBC: 4.61 MIL/uL (ref 3.87–5.11)
RDW: 14.9 % (ref 11.5–15.5)
WBC: 5 10*3/uL (ref 4.0–10.5)

## 2017-07-01 LAB — RAPID URINE DRUG SCREEN, HOSP PERFORMED
Amphetamines: NOT DETECTED
BARBITURATES: NOT DETECTED
Benzodiazepines: NOT DETECTED
COCAINE: POSITIVE — AB
Opiates: NOT DETECTED
TETRAHYDROCANNABINOL: NOT DETECTED

## 2017-07-01 LAB — ETHANOL

## 2017-07-01 NOTE — ED Notes (Signed)
EDP at bedside  

## 2017-07-01 NOTE — ED Triage Notes (Addendum)
Patient comes in complaining of wanting detox from "alcohol and a little drugs." Patient also complaining of shortness of breath for 2 months. Patient endorses smoking crack. Patient states her family left her and her boyfriend has not come back to the hotel and she is lonely. Patient is ambulatory with a steady gait, speaking in full sentences with breathing unlabored.

## 2017-07-01 NOTE — ED Notes (Signed)
Patient stating that her ride is coming so she is leaving. Patient seen leaving triage.

## 2017-07-01 NOTE — ED Provider Notes (Addendum)
[facility]-EMERGENCY DEPT Provider Note   CSN: [id number] Arrival date & time: 07/01/17  [id number]     History   Chief Complaint Chief Complaint  Patient presents with  . Detox    HPI [name] is a 57 y.o. female.  HPI 57 y/o with hx of bipolar disorder comes in with cc of detox. PT states that she has been using cocaine and drinking alcohol heavily the last few days. She has been doing so because she is hanging with the wrong people. Pt wants detox. She denies si/hi. She has been taking her meds, but is due on her IM invega.   ROS is + for back pain, and pt has had uti with similar pain. She has no pain with urination.  Past Medical History:  Diagnosis Date  . Arthritis   . Bipolar 1 disorder (HCC)   . Hypertension   . Obesity   . Psychiatric illness   . Schizo-affective schizophrenia (HCC)   . Seizures (HCC)   . Substance abuse [facility](HCC)     Patient Active Problem List   Diagnosis Date Noted  . Substance induced mood disorder (HCC) 12/27/2015  . Fever, unspecified 09/13/2015  . Leukocytosis 09/13/2015  . Acute encephalopathy 09/13/2015  . Substance abuse (HCC) 09/12/2015  . Hallucination   . Homicidal ideation   . Schizophrenia, unspecified type (HCC)   . Schizoaffective disorder, bipolar type (HCC)   . Schizoaffective disorder (HCC) 03/29/2013  . Cocaine abuse (HCC) 06/30/2011  . Non-compliant patient 06/29/2011  . Hypertension 06/29/2011  . Alcohol dependence (HCC) 06/28/2011  . Polysubstance dependence (HCC) 04/03/2011  . Psychiatric problem 04/03/2011  . Polysubstance abuse (HCC) 06/21/2010  . Schizophrenia (HCC) 06/21/2010    Past Surgical History:  Procedure Laterality Date  . STOMACH SURGERY       OB History   None      Home Medications    Prior to Admission medications   Medication Sig Start Date End Date Taking? Authorizing Provider  benztropine (COGENTIN) 0.5 MG tablet Take 0.5 mg by mouth 2 (two) times daily.   09/16/16 09/16/17  [provider]  divalproex (DEPAKOTE ER) 500 MG 24 hr tablet Take 1 tablet (500 mg total) by mouth 2 (two) times daily. Patient taking differently: Take 1,000 mg by mouth 2 (two) times daily.  12/28/15   Thermon Leylandavis, Laura A, NP  hydrochlorothiazide (MICROZIDE) 12.5 MG capsule Take 1 capsule (12.5 mg total) by mouth daily. 12/28/15   Thermon Leylandavis, Laura A, NP  lisinopril (PRINIVIL,ZESTRIL) 5 MG tablet Take 1 tablet (5 mg total) by mouth daily. 12/28/15   Thermon Leylandavis, Laura A, NP  paliperidone (INVEGA SUSTENNA) 156 MG/ML SUSP injection Inject 1 mL (156 mg total) into the muscle every 30 (thirty) days. Patient not taking: Reported on 12/03/2016 12/28/15   Thermon Leylandavis, Laura A, NP  paliperidone (INVEGA SUSTENNA) 234 MG/1.5ML SUSP injection Inject 234 mg into the muscle every 30 (thirty) days.    [provider]  paliperidone (INVEGA) 6 MG 24 hr tablet Take 6 mg by mouth daily.    [provider]  potassium chloride (K-DUR,KLOR-CON) 10 MEQ tablet Take 1 tablet (10 mEq total) by mouth daily. 12/28/15   Thermon Leylandavis, Laura A, NP  QUEtiapine (SEROQUEL) 100 MG tablet Take 100 mg by mouth at bedtime.    [provider]  risperiDONE (RISPERDAL) 2 MG tablet Take 1 tablet (2 mg total) by mouth 2 (two) times daily. 12/28/15   Thermon Leylandavis, Laura A, NP  risperiDONE (RISPERDAL) 2  MG tablet 1 in a m 2 at hs 09/16/16   [provider]  topiramate (TOPAMAX) 50 MG tablet Take 50 mg by mouth. 09/16/16   [provider]  traZODone (DESYREL) 50 MG tablet Take 50 mg by mouth. 09/16/16   [provider]    Family History Family History  Problem Relation Age of Onset  . Alzheimer's disease Mother   . Depression Mother     Social History Social History   Tobacco Use  . Smoking status: Current Every Day Smoker    Packs/day: 1.00    Types: Cigarettes  . Smokeless tobacco: Never Used  Substance Use Topics  . Alcohol use: Yes    Alcohol/week: 3.6 oz    Types: 6 Cans of beer per  week  . Drug use: Yes    Frequency: 5.0 times per week    Types: "Crack" cocaine, Cocaine    Comment: crack/cocaine     Allergies   Patient has no known allergies.   Review of Systems Review of Systems  Constitutional: Negative for activity change.  Respiratory: Negative for shortness of breath.   Cardiovascular: Negative for chest pain.  Gastrointestinal: Negative for abdominal pain, diarrhea, nausea and vomiting.  Musculoskeletal: Positive for back pain.  Psychiatric/Behavioral: Negative for suicidal ideas.     Physical Exam Updated Vital Signs BP (!) 163/108 (BP Location: Left Arm)   Pulse 96   Temp 98.9 F (37.2 C) (Oral)   Resp 18   Ht 5\' 7"  (1.702 m)   Wt 98 kg (216 lb)   LMP 01/13/2012   SpO2 93%   BMI 33.83 kg/m   Physical Exam  Constitutional: She appears well-developed.  HENT:  Head: Normocephalic and atraumatic.  Eyes: EOM are normal.  Neck: Normal range of motion. Neck supple.  Cardiovascular: Normal rate.  Pulmonary/Chest: Effort normal.  Abdominal: Soft. Bowel sounds are normal. There is no tenderness.  Neurological: She is alert.  Skin: Skin is warm and dry.  Nursing note and vitals reviewed.    ED Treatments / Results  Labs (all labs ordered are listed, but only abnormal results are displayed) Labs Reviewed  COMPREHENSIVE METABOLIC PANEL - Abnormal; Notable for the following components:      Result Value   Glucose, Bld 133 (*)    All other components within normal limits  RAPID URINE DRUG SCREEN, HOSP PERFORMED - Abnormal; Notable for the following components:   Cocaine POSITIVE (*)    All other components within normal limits  ETHANOL  CBC  URINALYSIS, ROUTINE W REFLEX MICROSCOPIC    EKG None  Radiology No results found.  Procedures Procedures (including critical care time)  Medications Ordered in ED Medications - No data to display   Initial Impression / Assessment and Plan / ED Course  I have reviewed the triage  vital signs and the nursing notes.  Pertinent labs & imaging results that were available during my care of the patient were reviewed by me and considered in my medical decision making (see chart for details).     57 y/o comes in with cc of detox. Pt is not having any life threatening withdrawal right now and she has no sihi. Pt also not severely decompensated with her bipolar disorder. Anticipate d/c. Will check UA.   6:57 AM I asked patient to stay in the ER for her urine to result.  However patient just walked out. I had also called the 800 number for the social services that is listed as  the legal guardian for the patient.  I informed him about the visit to the ER and to make sure that they follow-up with the patient.  Final Clinical Impressions(s) / ED Diagnoses   Final diagnoses:  Polysubstance abuse [facility])    ED Discharge Orders    None       Derwood Kaplan, MD 07/01/17 1610    Derwood Kaplan, MD 07/01/17 [id number]    Derwood Kaplan, MD 07/01/17 [phone]

## 2017-07-01 NOTE — ED Notes (Signed)
Bed: [facility] Expected date:  Expected time:  Means of arrival:  Comments: 

## 2017-08-17 ENCOUNTER — Other Ambulatory Visit: Payer: Self-pay

## 2017-08-17 ENCOUNTER — Emergency Department (HOSPITAL_COMMUNITY)
Admission: EM | Admit: 2017-08-17 | Discharge: 2017-08-18 | Disposition: A | Discharge status: Home / Self Care | Payer: Medicare Other | Attending: Emergency Medicine | Admitting: Emergency Medicine

## 2017-08-17 ENCOUNTER — Encounter (HOSPITAL_COMMUNITY): Payer: Self-pay | Admitting: Emergency Medicine

## 2017-08-17 DIAGNOSIS — F250 Schizoaffective disorder, bipolar type: Secondary | ICD-10-CM | POA: Diagnosis present

## 2017-08-17 DIAGNOSIS — Z818 Family history of other mental and behavioral disorders: Secondary | ICD-10-CM | POA: Diagnosis not present

## 2017-08-17 DIAGNOSIS — Z79899 Other long term (current) drug therapy: Secondary | ICD-10-CM | POA: Diagnosis not present

## 2017-08-17 DIAGNOSIS — F259 Schizoaffective disorder, unspecified: Secondary | ICD-10-CM | POA: Insufficient documentation

## 2017-08-17 DIAGNOSIS — R443 Hallucinations, unspecified: Secondary | ICD-10-CM

## 2017-08-17 DIAGNOSIS — F1410 Cocaine abuse, uncomplicated: Secondary | ICD-10-CM

## 2017-08-17 DIAGNOSIS — I10 Essential (primary) hypertension: Secondary | ICD-10-CM | POA: Insufficient documentation

## 2017-08-17 DIAGNOSIS — Z810 Family history of intellectual disabilities: Secondary | ICD-10-CM | POA: Diagnosis not present

## 2017-08-17 DIAGNOSIS — F17210 Nicotine dependence, cigarettes, uncomplicated: Secondary | ICD-10-CM | POA: Diagnosis not present

## 2017-08-17 LAB — COMPREHENSIVE METABOLIC PANEL
ALK PHOS: 49 U/L (ref 38–126)
ALT: 36 U/L (ref 14–54)
AST: 34 U/L (ref 15–41)
Albumin: 3.6 g/dL (ref 3.5–5.0)
Anion gap: 9 (ref 5–15)
BUN: 19 mg/dL (ref 6–20)
CALCIUM: 9.2 mg/dL (ref 8.9–10.3)
CHLORIDE: 105 mmol/L (ref 101–111)
CO2: 27 mmol/L (ref 22–32)
CREATININE: 0.87 mg/dL (ref 0.44–1.00)
Glucose, Bld: 153 mg/dL — ABNORMAL HIGH (ref 65–99)
Potassium: 4 mmol/L (ref 3.5–5.1)
Sodium: 141 mmol/L (ref 135–145)
TOTAL PROTEIN: 6.9 g/dL (ref 6.5–8.1)
Total Bilirubin: 0.7 mg/dL (ref 0.3–1.2)

## 2017-08-17 LAB — URINALYSIS, ROUTINE W REFLEX MICROSCOPIC
BILIRUBIN URINE: NEGATIVE
Glucose, UA: NEGATIVE mg/dL
HGB URINE DIPSTICK: NEGATIVE
KETONES UR: NEGATIVE mg/dL
NITRITE: NEGATIVE
Protein, ur: NEGATIVE mg/dL
Specific Gravity, Urine: 1.03 (ref 1.005–1.030)
pH: 5 (ref 5.0–8.0)

## 2017-08-17 LAB — SALICYLATE LEVEL

## 2017-08-17 LAB — RAPID URINE DRUG SCREEN, HOSP PERFORMED
Amphetamines: NOT DETECTED
Barbiturates: NOT DETECTED
Benzodiazepines: NOT DETECTED
Cocaine: POSITIVE — AB
OPIATES: NOT DETECTED
Tetrahydrocannabinol: NOT DETECTED

## 2017-08-17 LAB — CBC
HCT: 43.5 % (ref 36.0–46.0)
Hemoglobin: 14.2 g/dL (ref 12.0–15.0)
MCH: 29.8 pg (ref 26.0–34.0)
MCHC: 32.6 g/dL (ref 30.0–36.0)
MCV: 91.4 fL (ref 78.0–100.0)
PLATELETS: 247 10*3/uL (ref 150–400)
RBC: 4.76 MIL/uL (ref 3.87–5.11)
RDW: 14.5 % (ref 11.5–15.5)
WBC: 5 10*3/uL (ref 4.0–10.5)

## 2017-08-17 LAB — ETHANOL

## 2017-08-17 LAB — ACETAMINOPHEN LEVEL: Acetaminophen (Tylenol), Serum: <10 ug/mL — ABNORMAL LOW (ref 10–30)

## 2017-08-17 MED ORDER — BENZTROPINE MESYLATE 0.5 MG PO TABS
0.5000 mg | ORAL_TABLET | Freq: Two times a day (BID) | ORAL | Status: DC
  Administered 2017-08-18: 0.5 mg via ORAL
  Filled 2017-08-17: qty 1

## 2017-08-17 MED ORDER — QUETIAPINE FUMARATE 100 MG PO TABS
100.0000 mg | ORAL_TABLET | Freq: Every day | ORAL | Status: DC
Start: 2017-08-17 — End: 2017-08-18
  Administered 2017-08-17: 100 mg via ORAL
  Filled 2017-08-17: qty 1

## 2017-08-17 MED ORDER — DIVALPROEX SODIUM ER 500 MG PO TB24
1000.0000 mg | ORAL_TABLET | Freq: Two times a day (BID) | ORAL | Status: DC
  Administered 2017-08-18: 1000 mg via ORAL
  Filled 2017-08-17: qty 2

## 2017-08-17 MED ORDER — ACETAMINOPHEN 325 MG PO TABS
650.0000 mg | ORAL_TABLET | Freq: Once | ORAL | Status: AC
  Administered 2017-08-17: 650 mg via ORAL
  Filled 2017-08-17: qty 2

## 2017-08-17 MED ORDER — LISINOPRIL 5 MG PO TABS
5.0000 mg | ORAL_TABLET | Freq: Every day | ORAL | Status: DC
  Administered 2017-08-18: 5 mg via ORAL
  Filled 2017-08-17: qty 1

## 2017-08-17 MED ORDER — HYDROCHLOROTHIAZIDE 12.5 MG PO CAPS
12.5000 mg | ORAL_CAPSULE | Freq: Every day | ORAL | Status: DC
  Administered 2017-08-18: 12.5 mg via ORAL
  Filled 2017-08-17: qty 1

## 2017-08-17 MED ORDER — FOSFOMYCIN TROMETHAMINE 3 G PO PACK
3.0000 g | PACK | Freq: Once | ORAL | Status: AC
  Administered 2017-08-17: 3 g via ORAL
  Filled 2017-08-17: qty 3

## 2017-08-17 NOTE — BH Assessment (Signed)
Tele Assessment Note   Patient Name: [name] MRN: [id number] Referring Physician: Melene Plan, DO Location of [facility]: [facility] Location of Provider: Behavioral Health TTS Department  [name] is an 57 y.o. female brought [facility] for hallucinations and complaints that 'drug dealers' are out to kill her because she owes them money. Patient endorses hearing voices and seeing shadows. Mental Health history Schizoaffective disorder, Bipolar type. Report feels paranoid due to she owes drug dealers money in both [place] and [place] and she's afraid they are going to kill her. Patient denies suicidal and homicidal ideations. Patient drug of choice crack cocaine and alcohol, last use last night.   Mary Halt, RN, notes states  Pt BIB police; friend called police related to pt verbalizes "not going to be here much longer because the drug dealers are looking for me." Concern for hallucinations; hx of drug use.     Diagnosis:  F25.0  Schizoaffective disorder, Bipolar type  Past Medical History:  Past Medical History:  Diagnosis Date  . Arthritis   . Bipolar 1 disorder (HCC)   . Hypertension   . Obesity   . Psychiatric illness   . Schizo-affective schizophrenia (HCC)   . Seizures (HCC)   . Substance abuse [facility])     Past Surgical History:  Procedure Laterality Date  . STOMACH SURGERY      Family History:  Family History  Problem Relation Age of Onset  . Alzheimer's disease Mother   . Depression Mother     Social History:  reports that she has been smoking cigarettes.  She has been smoking about 1.00 pack per day. She has never used smokeless tobacco. She reports that she drinks about 3.6 oz of alcohol per week. She reports that she has current or past drug history. Drugs: "Crack" cocaine and Cocaine. Frequency: 5.00 times per week.  Additional Social History:  Alcohol / Drug Use Pain Medications: See MAR Prescriptions: See MAR Over the Counter: See MAR History of alcohol /  drug use?: Yes Substance #1 Name of Substance 1: crack cocaine 1 - Age of First Use: 20's 1 - Amount (size/oz): unknown 1 - Frequency: often 1 - Duration: ongong  1 - Last Use / Amount: 08/16/2017 Substance #2 Name of Substance 2: alcohol 2 - Age of First Use: unknown 2 - Amount (size/oz): unknown 2 - Frequency: unknown 2 - Duration: unknown 2 - Last Use / Amount: pt reports she was drinking beer with her spouse today   CIWA: CIWA-Ar BP: (!) 136/95 Pulse Rate: 98 COWS:    Allergies: No Known Allergies  Home Medications:  (Not in a hospital admission)  OB/GYN Status:  Patient's last menstrual period was 01/13/2012.  General Assessment Data Location of Assessment: [facility] TTS Assessment: In system Is this a Tele or Face-to-Face Assessment?: Face-to-Face Is this an Initial Assessment or a Re-assessment for this encounter?: Initial Assessment Marital status: Single Living Arrangements: Alone Can pt return to current living arrangement?: Yes Admission Status: Voluntary Is patient capable of signing voluntary admission?: Yes Referral Source: Self/Family/Friend Insurance type: Medicare     Crisis Care Plan Living Arrangements: Alone Legal Guardian: Other:(self) Name of Psychiatrist: [facility] Name of Therapist: [facility]  Education Status Is patient currently in school?: No Is the patient employed, unemployed or receiving disability?: Receiving disability income  Risk to self with the past 6 months Suicidal Ideation: No Has patient been a risk to self within the past 6 months prior to admission? :  No Suicidal Intent: No Has patient had any suicidal intent within the past 6 months prior to admission? : No Is patient at risk for suicide?: No Suicidal Plan?: No Has patient had any suicidal plan within the past 6 months prior to admission? : No Access to Means: No What has been your use of drugs/alcohol within the last 12 months?: crack cocaine, alcohol   Previous Attempts/Gestures: No How many times?: 0 Other Self Harm Risks: none known Triggers for Past Attempts: None known Intentional Self Injurious Behavior: None Family Suicide History: No Recent stressful life event(s): Other (Comment)(owe people money (financial) ) Persecutory voices/beliefs?: Yes Depression: Yes Depression Symptoms: Feeling angry/irritable, Insomnia Substance abuse history and/or treatment for substance abuse?: Yes Suicide prevention information given to non-admitted patients: Not applicable  Risk to Others within the past 6 months Homicidal Ideation: No Does patient have any lifetime risk of violence toward others beyond the six months prior to admission? : No Thoughts of Harm to Others: No Current Homicidal Intent: No Current Homicidal Plan: No Access to Homicidal Means: No Identified Victim: n/a History of harm to others?: No Assessment of Violence: None Noted Violent Behavior Description: None noted Does patient have access to weapons?: No Criminal Charges Pending?: No Does patient have a court date: No Is patient on probation?: No  Psychosis Hallucinations: Auditory, Visual Delusions: None noted  Mental Status Report Appearance/Hygiene: Disheveled, In scrubs Eye Contact: Good Motor Activity: Agitation, Freedom of movement Speech: Logical/coherent, Rapid Level of Consciousness: Alert Mood: Terrified, Anxious Affect: Anxious Anxiety Level: Minimal Thought Processes: Coherent, Relevant Judgement: Unimpaired Orientation: Person, Place, Time, Situation Obsessive Compulsive Thoughts/Behaviors: None  Cognitive Functioning Concentration: Normal Memory: Recent Intact, Remote Intact Is patient IDD: No Is patient DD?: Unknown Insight: Fair Impulse Control: Poor Appetite: Good Have you had any weight changes? : No Change Sleep: Decreased Total Hours of Sleep: 3 Vegetative Symptoms: None  ADLScreening [facility] Assessment Services) Patient's  cognitive ability adequate to safely complete daily activities?: Yes Patient able to express need for assistance with ADLs?: Yes Independently performs ADLs?: Yes (appropriate for developmental age)  Prior Inpatient Therapy Prior Inpatient Therapy: Yes Prior Therapy Dates: (unknown) Prior Therapy Facilty/Provider(s): [facility] Reason for Treatment: mental health   Prior Outpatient Therapy Prior Outpatient Therapy: Yes Prior Therapy Dates: weekly - ACTT Team  Prior Therapy Facilty/Provider(s): [facility] Reason for Treatment: mental health  Does patient have an ACCT team?: Yes([facility]) Does patient have Intensive In-House Services?  : No Does patient have [facility] services? : No Does patient have P4CC services?: No  ADL Screening (condition at time of admission) Patient's cognitive ability adequate to safely complete daily activities?: Yes Is the patient deaf or have difficulty hearing?: No Does the patient have difficulty seeing, even when wearing glasses/contacts?: No Does the patient have difficulty concentrating, remembering, or making decisions?: No Patient able to express need for assistance with ADLs?: Yes Does the patient have difficulty dressing or bathing?: No Independently performs ADLs?: Yes (appropriate for developmental age) Does the patient have difficulty walking or climbing stairs?: No       Abuse/Neglect Assessment (Assessment to be complete while patient is alone) Abuse/Neglect Assessment Can Be Completed: Yes Physical Abuse: Denies Verbal Abuse: Denies Sexual Abuse: Denies Exploitation of patient/patient's resources: Denies Self-Neglect: Denies     Merchant navy officerAdvance Directives (For Healthcare) Does Patient Have a Medical Advance Directive?: No Would patient like information on creating a medical advance directive?: No - Patient declined  Disposition:  Disposition Initial Assessment Completed for this Encounter: Yes(Jamison Lord, NP,  recommend overnight observation )   Adel Neyer [facility] 08/17/2017 6:44 PM

## 2017-08-17 NOTE — [facility] Counselor (Signed)
Consulted the case with Nanine MeansJamison Lord, NP, recommends overnight observation for safety and re-assess in the a.m.

## 2017-08-17 NOTE — ED Provider Notes (Signed)
[facility]-EMERGENCY DEPT Provider Note   CSN: [id number] Arrival date & time: 08/17/17  1656     History   Chief Complaint Chief Complaint  Patient presents with  . Hallucinations    HPI [name] is a 57 y.o. female.  57 yo F brought by police for hallucinations.  The patient states that she has been doing a lot of cocaine recently and that her family is told her she needs to come and be placed into a facility.  She denies hallucinations.  Denies headache denies fevers or chills.  She states she has had increased urinary frequency recently.  Denies abdominal pain denies flank pain.  Denies homicidal or suicidal ideation.  The history is provided by the patient.  Illness  This is a new problem. The current episode started 2 days ago. The problem occurs constantly. The problem has not changed since onset.Pertinent negatives include no chest pain, no headaches and no shortness of breath. Nothing aggravates the symptoms. Nothing relieves the symptoms. She has tried nothing for the symptoms. The treatment provided no relief.    Past Medical History:  Diagnosis Date  . Arthritis   . Bipolar 1 disorder (HCC)   . Hypertension   . Obesity   . Psychiatric illness   . Schizo-affective schizophrenia (HCC)   . Seizures (HCC)   . Substance abuse [facility])     Patient Active Problem List   Diagnosis Date Noted  . Substance induced mood disorder (HCC) 12/27/2015  . Fever, unspecified 09/13/2015  . Leukocytosis 09/13/2015  . Acute encephalopathy 09/13/2015  . Substance abuse (HCC) 09/12/2015  . Hallucination   . Homicidal ideation   . Schizophrenia, unspecified type (HCC)   . Schizoaffective disorder, bipolar type (HCC)   . Schizoaffective disorder (HCC) 03/29/2013  . Cocaine abuse (HCC) 06/30/2011  . Non-compliant patient 06/29/2011  . Hypertension 06/29/2011  . Alcohol dependence (HCC) 06/28/2011  . Polysubstance dependence (HCC) 04/03/2011  . Psychiatric  problem 04/03/2011  . Polysubstance abuse (HCC) 06/21/2010  . Schizophrenia (HCC) 06/21/2010    Past Surgical History:  Procedure Laterality Date  . STOMACH SURGERY       OB History   None      Home Medications    Prior to Admission medications   Medication Sig Start Date End Date Taking? Authorizing Provider  Valbenazine Tosylate [facility]) 40 MG CAPS Take 1 capsule by mouth daily.   Yes [provider]  benztropine (COGENTIN) 0.5 MG tablet Take 0.5 mg by mouth 2 (two) times daily.  09/16/16 09/16/17  [provider]  divalproex (DEPAKOTE ER) 500 MG 24 hr tablet Take 1 tablet (500 mg total) by mouth 2 (two) times daily. Patient taking differently: Take 1,000 mg by mouth 2 (two) times daily.  12/28/15   Thermon Leyland, NP  hydrochlorothiazide (MICROZIDE) 12.5 MG capsule Take 1 capsule (12.5 mg total) by mouth daily. Patient not taking: Reported on 08/17/2017 12/28/15   Thermon Leyland, NP  lisinopril (PRINIVIL,ZESTRIL) 5 MG tablet Take 1 tablet (5 mg total) by mouth daily. 12/28/15   Thermon Leyland, NP  paliperidone (INVEGA SUSTENNA) 156 MG/ML SUSP injection Inject 1 mL (156 mg total) into the muscle every 30 (thirty) days. Patient not taking: Reported on 12/03/2016 12/28/15   Thermon Leyland, NP  paliperidone (INVEGA SUSTENNA) 234 MG/1.5ML SUSP injection Inject 234 mg into the muscle every 30 (thirty) days.    [provider]  paliperidone (INVEGA) 6 MG 24 hr tablet Take  6 mg by mouth daily.    [provider]  potassium chloride (K-DUR,KLOR-CON) 10 MEQ tablet Take 1 tablet (10 mEq total) by mouth daily. 12/28/15   Thermon Leyland, NP  QUEtiapine (SEROQUEL) 100 MG tablet Take 100 mg by mouth at bedtime.    [provider]  risperiDONE (RISPERDAL) 2 MG tablet Take 1 tablet (2 mg total) by mouth 2 (two) times daily. 12/28/15   Thermon Leyland, NP  topiramate (TOPAMAX) 50 MG tablet Take 50 mg by mouth. 09/16/16   [provider]  traZODone  (DESYREL) 50 MG tablet Take 50 mg by mouth. 09/16/16   [provider]    Family History Family History  Problem Relation Age of Onset  . Alzheimer's disease Mother   . Depression Mother     Social History Social History   Tobacco Use  . Smoking status: Current Every Day Smoker    Packs/day: 1.00    Types: Cigarettes  . Smokeless tobacco: Never Used  Substance Use Topics  . Alcohol use: Yes    Alcohol/week: 3.6 oz    Types: 6 Cans of beer per week  . Drug use: Yes    Frequency: 5.0 times per week    Types: "Crack" cocaine, Cocaine    Comment: crack/cocaine     Allergies   Patient has no known allergies.   Review of Systems Review of Systems  Constitutional: Negative for chills and fever.  HENT: Negative for congestion and rhinorrhea.   Eyes: Negative for redness and visual disturbance.  Respiratory: Negative for shortness of breath and wheezing.   Cardiovascular: Negative for chest pain and palpitations.  Gastrointestinal: Negative for nausea and vomiting.  Genitourinary: Negative for dysuria and urgency.  Musculoskeletal: Negative for arthralgias and myalgias.  Skin: Negative for pallor and wound.  Neurological: Negative for dizziness and headaches.  Psychiatric/Behavioral: Positive for hallucinations. Negative for suicidal ideas. The patient is hyperactive.      Physical Exam Updated Vital Signs BP (!) 136/95 (BP Location: Left Arm)   Pulse 98   Temp 98.7 F (37.1 C) (Oral)   Resp 16   LMP 01/13/2012   SpO2 94%   Physical Exam  Constitutional: She is oriented to person, place, and time. She appears well-developed and well-nourished. No distress.  HENT:  Head: Normocephalic and atraumatic.  Eyes: Pupils are equal, round, and reactive to light. EOM are normal.  Neck: Normal range of motion. Neck supple.  Cardiovascular: Normal rate and regular rhythm. Exam reveals no gallop and no friction rub.  No murmur heard. Pulmonary/Chest: Effort normal.  She has no wheezes. She has no rales.  Abdominal: Soft. She exhibits no distension. There is no tenderness.  Musculoskeletal: She exhibits no edema or tenderness.  Neurological: She is alert and oriented to person, place, and time.  Skin: Skin is warm and dry. She is not diaphoretic.  Psychiatric: She has a normal mood and affect. Her behavior is normal.  Nursing note and vitals reviewed.    ED Treatments / Results  Labs (all labs ordered are listed, but only abnormal results are displayed) Labs Reviewed  COMPREHENSIVE METABOLIC PANEL - Abnormal; Notable for the following components:      Result Value   Glucose, Bld 153 (*)    All other components within normal limits  ACETAMINOPHEN LEVEL - Abnormal; Notable for the following components:   Acetaminophen (Tylenol), Serum <10 (*)    All other components within normal limits  RAPID URINE DRUG SCREEN, HOSP  PERFORMED - Abnormal; Notable for the following components:   Cocaine POSITIVE (*)    All other components within normal limits  URINALYSIS, ROUTINE W REFLEX MICROSCOPIC - Abnormal; Notable for the following components:   APPearance TURBID (*)    Leukocytes, UA TRACE (*)    Bacteria, UA MANY (*)    All other components within normal limits  ETHANOL  SALICYLATE LEVEL  CBC    EKG None  Radiology No results found.  Procedures Procedures (including critical care time)  Medications Ordered in ED Medications  benztropine (COGENTIN) tablet 0.5 mg (0.5 mg Oral Not Given 08/17/17 2317)  divalproex (DEPAKOTE ER) 24 hr tablet 1,000 mg (1,000 mg Oral Not Given 08/17/17 2317)  hydrochlorothiazide (MICROZIDE) capsule 12.5 mg (has no administration in time range)  lisinopril (PRINIVIL,ZESTRIL) tablet 5 mg (has no administration in time range)  QUEtiapine (SEROQUEL) tablet 100 mg (100 mg Oral Given 08/17/17 2352)  fosfomycin (MONUROL) packet 3 g (3 g Oral Given 08/17/17 1827)  acetaminophen (TYLENOL) tablet 650 mg (650 mg Oral Given 08/17/17  2351)     Initial Impression / Assessment and Plan / ED Course  I have reviewed the triage vital signs and the nursing notes.  Pertinent labs & imaging results that were available during my care of the patient were reviewed by me and considered in my medical decision making (see chart for details).     57 yo F who states that she was brought by police because she was hallucinating and yelling throughout the people.  She does not think that she has been hallucinating.  She has been doing illegal drugs.  Specifically cocaine.  She thinks she needs help to get off of cocaine.  Is requesting to talk to TTS about being placed in a facility.  She has been complaining about urinary symptoms, her UA with trace leukocyte esterase prompting a microscopic evaluation with too numerous to count bacteria and no white blood cells.  I am not sure that this represents an infection but with the patient having symptoms we will give a dose of fosfomycin.  I feel that she is medically clear.  The patients results and plan were reviewed and discussed.   Any x-rays performed were independently reviewed by myself.   Differential diagnosis were considered with the presenting HPI.  Medications  benztropine (COGENTIN) tablet 0.5 mg (0.5 mg Oral Not Given 08/17/17 2317)  divalproex (DEPAKOTE ER) 24 hr tablet 1,000 mg (1,000 mg Oral Not Given 08/17/17 2317)  hydrochlorothiazide (MICROZIDE) capsule 12.5 mg (has no administration in time range)  lisinopril (PRINIVIL,ZESTRIL) tablet 5 mg (has no administration in time range)  QUEtiapine (SEROQUEL) tablet 100 mg (100 mg Oral Given 08/17/17 2352)  fosfomycin (MONUROL) packet 3 g (3 g Oral Given 08/17/17 1827)  acetaminophen (TYLENOL) tablet 650 mg (650 mg Oral Given 08/17/17 2351)    Vitals:   08/17/17 1711  BP: (!) 136/95  Pulse: 98  Resp: 16  Temp: 98.7 F (37.1 C)  TempSrc: Oral  SpO2: 94%    Final diagnoses:  Cocaine abuse (HCC)  Hallucinations    Admission/  observation were discussed with the admitting physician, patient and/or family and they are comfortable with the plan.   Final Clinical Impressions(s) / ED Diagnoses   Final diagnoses:  Cocaine abuse [facility](HCC)  Hallucinations    ED Discharge Orders    None       Melene PlanFloyd, Bralyn Espino, DO 08/17/17 2358

## 2017-08-17 NOTE — ED Triage Notes (Signed)
Pt BIB police; friend called police related to pt verbalizes "not going to be here much longer because the drug dealers are looking for me." Concern for hallucinations; hx of drug use.

## 2017-08-17 NOTE — ED Notes (Signed)
Ms [name] out to nurse station po  2200 medication given

## 2017-08-18 DIAGNOSIS — F250 Schizoaffective disorder, bipolar type: Secondary | ICD-10-CM | POA: Diagnosis not present

## 2017-08-18 DIAGNOSIS — F17210 Nicotine dependence, cigarettes, uncomplicated: Secondary | ICD-10-CM

## 2017-08-18 DIAGNOSIS — Z818 Family history of other mental and behavioral disorders: Secondary | ICD-10-CM | POA: Diagnosis not present

## 2017-08-18 DIAGNOSIS — Z810 Family history of intellectual disabilities: Secondary | ICD-10-CM

## 2017-08-18 DIAGNOSIS — F259 Schizoaffective disorder, unspecified: Secondary | ICD-10-CM | POA: Diagnosis not present

## 2017-08-18 LAB — URINALYSIS, ROUTINE W REFLEX MICROSCOPIC
BILIRUBIN URINE: NEGATIVE
Glucose, UA: NEGATIVE mg/dL
Hgb urine dipstick: NEGATIVE
KETONES UR: NEGATIVE mg/dL
Leukocytes, UA: NEGATIVE
NITRITE: NEGATIVE
Protein, ur: NEGATIVE mg/dL
SPECIFIC GRAVITY, URINE: 1.017 (ref 1.005–1.030)
pH: 7 (ref 5.0–8.0)

## 2017-08-18 MED ORDER — QUETIAPINE FUMARATE 100 MG PO TABS
100.0000 mg | ORAL_TABLET | Freq: Two times a day (BID) | ORAL | Status: DC
  Administered 2017-08-18: 100 mg via ORAL

## 2017-08-18 NOTE — BH Assessment (Signed)
Patient evaluated by Dr. Sharma [name] and [name], NP. Inpatient Treatment was recommended. No appropriate [facility] beds at this time. Patient referred to outside facilities including: [facility], [facility], [facility], [facility], [place] [facility] [address], [address], [place], [address], [place], and [facility].

## 2017-08-18 NOTE — Consult Note (Addendum)
[facility] Consult   Reason for Consult:  Hallucinations Referring Physician:  EDP Patient Identification: [name] MRN:  [id number] Principal Diagnosis: Schizoaffective disorder, bipolar type [facility]) Diagnosis:   Patient Active Problem List   Diagnosis Date Noted  . Substance induced mood disorder ([place]) [F19.94] 12/27/2015  . Fever, unspecified [R50.9] 09/13/2015  . Leukocytosis [D72.829] 09/13/2015  . Acute encephalopathy [G93.40] 09/13/2015  . Substance abuse ([place]) [F19.10] 09/12/2015  . Hallucination [R44.3]   . Homicidal ideation [R45.850]   . Schizophrenia, unspecified type ([place]) [F20.9]   . Schizoaffective disorder, bipolar type ([place]) [F25.0]   . Schizoaffective disorder ([place]) [F25.9] 03/29/2013  . Cocaine abuse ([place]) [F14.10] 06/30/2011  . Non-compliant patient [Z91.19] 06/29/2011  . Hypertension [I10] 06/29/2011  . Alcohol dependence ([place]) [F10.20] 06/28/2011  . Polysubstance dependence ([place]) [F19.20] 04/03/2011  . Psychiatric problem [F99] 04/03/2011  . Polysubstance abuse ([place]) [F19.10] 06/21/2010  . Schizophrenia ([place]) [F20.9] 06/21/2010    Total Time spent with patient: 30 minutes  Subjective:   [name] is a 57 y.o. female patient admitted with psychosis.  HPI:   Per chart review, patient was admitted with psychosis. She reports that drug dealers are out to killer her and she owes them money. She endorses hearing voices and seeing shadows. On interview, Ms. Lung reports, "I ain't go the money for the drug dealers." She does report using crack cocaine daily. She denies current SI, HI or AVH. She is labile and tangential in thought process. She becomes more disorganized throughout the interview.   Past Psychiatric History: Cocaine abuse, schizoaffective disorder and BPAD.   Risk to Self: Suicidal Ideation: No Suicidal Intent: No Is patient at risk for suicide?: No Suicidal Plan?: No Access to Means: No What has been your use of  drugs/alcohol within the last 12 months?: crack cocaine, alcohol  How many times?: 0 Other Self Harm Risks: none known Triggers for Past Attempts: None known Intentional Self Injurious Behavior: None Risk to Others: Homicidal Ideation: No Thoughts of Harm to Others: No Current Homicidal Intent: No Current Homicidal Plan: No Access to Homicidal Means: No Identified Victim: n/a History of harm to others?: No Assessment of Violence: None Noted Violent Behavior Description: None noted Does patient have access to weapons?: No Criminal Charges Pending?: No Does patient have a court date: No Prior Inpatient Therapy: Prior Inpatient Therapy: Yes Prior Therapy Dates: (unknown) Prior Therapy Facilty/Provider(s): [facility] Reason for Treatment: mental health  Prior Outpatient Therapy: Prior Outpatient Therapy: Yes Prior Therapy Dates: weekly - ACTT Team  Prior Therapy Facilty/Provider(s): [facility] Reason for Treatment: mental health  Does patient have an ACCT team?: Yes([facility]) Does patient have Intensive In-House Services?  : No Does patient have [facility] services? : No Does patient have P4CC services?: No  Past Medical History:  Past Medical History:  Diagnosis Date  . Arthritis   . Bipolar 1 disorder ([place])   . Hypertension   . Obesity   . Psychiatric illness   . Schizo-affective schizophrenia ([place])   . Seizures ([place])   . Substance abuse [facility])     Past Surgical History:  Procedure Laterality Date  . STOMACH SURGERY     Family History:  Family History  Problem Relation Age of Onset  . Alzheimer's disease Mother   . Depression Mother    Family Psychiatric  History: Mother-alzheimer's disease and depression.   Social History:  Social History   Substance and Sexual Activity  Alcohol Use Yes  . Alcohol/week: 3.6  oz  . Types: 6 Cans of beer per week     Social History   Substance and Sexual Activity  Drug Use Yes  . Frequency: 5.0 times per week  . Types:  "Crack" cocaine, Cocaine   Comment: crack/cocaine    Social History   Socioeconomic History  . Marital status: Single    Spouse name: Not on file  . Number of children: Not on file  . Years of education: Not on file  . Highest education level: Not on file  Occupational History  . Not on file  Social Needs  . Financial resource strain: Not on file  . Food insecurity:    Worry: Not on file    Inability: Not on file  . Transportation needs:    Medical: Not on file    Non-medical: Not on file  Tobacco Use  . Smoking status: Current Every Day Smoker    Packs/day: 1.00    Types: Cigarettes  . Smokeless tobacco: Never Used  Substance and Sexual Activity  . Alcohol use: Yes    Alcohol/week: 3.6 oz    Types: 6 Cans of beer per week  . Drug use: Yes    Frequency: 5.0 times per week    Types: "Crack" cocaine, Cocaine    Comment: crack/cocaine  . Sexual activity: Never  Lifestyle  . Physical activity:    Days per week: Not on file    Minutes per session: Not on file  . Stress: Not on file  Relationships  . Social connections:    Talks on phone: Not on file    Gets together: Not on file    Attends religious service: Not on file    Active member of club or organization: Not on file    Attends meetings of clubs or organizations: Not on file    Relationship status: Not on file  Other Topics Concern  . Not on file  Social History Narrative  . Not on file   Additional Social History: N/A    Allergies:  No Known Allergies  Labs:  Results for orders placed or performed during the hospital encounter of 08/17/17 (from the past 48 hour(s))  Rapid urine drug screen (hospital performed)     Status: Abnormal   Collection Time: 08/17/17  5:09 PM  Result Value Ref Range   Opiates NONE DETECTED NONE DETECTED   Cocaine POSITIVE (A) NONE DETECTED   Benzodiazepines NONE DETECTED NONE DETECTED   Amphetamines NONE DETECTED NONE DETECTED   Tetrahydrocannabinol NONE DETECTED NONE  DETECTED   Barbiturates NONE DETECTED NONE DETECTED    Comment: (NOTE) DRUG SCREEN FOR MEDICAL PURPOSES ONLY.  IF CONFIRMATION IS NEEDED FOR ANY PURPOSE, NOTIFY LAB WITHIN 5 DAYS. LOWEST DETECTABLE LIMITS FOR URINE DRUG SCREEN Drug Class                     Cutoff (ng/mL) Amphetamine and metabolites    1000 Barbiturate and metabolites    200 Benzodiazepine                 242 Tricyclics and metabolites     300 Opiates and metabolites        300 Cocaine and metabolites        300 THC                            50 Performed at [facility], [place] [address] [place],  [place] [address]   Urinalysis, Routine w reflex microscopic     Status: Abnormal   Collection Time: 08/17/17  5:09 PM  Result Value Ref Range   Color, Urine YELLOW YELLOW   APPearance TURBID (A) CLEAR   Specific Gravity, Urine 1.030 1.005 - 1.030   pH 5.0 5.0 - 8.0   Glucose, UA NEGATIVE NEGATIVE mg/dL   Hgb urine dipstick NEGATIVE NEGATIVE   Bilirubin Urine NEGATIVE NEGATIVE   Ketones, ur NEGATIVE NEGATIVE mg/dL   Protein, ur NEGATIVE NEGATIVE mg/dL   Nitrite NEGATIVE NEGATIVE   Leukocytes, UA TRACE (A) NEGATIVE   Bacteria, UA MANY (A) NONE SEEN   Squamous Epithelial / LPF 6-10 0 - 5   Mucus PRESENT    Amorphous Crystal PRESENT     Comment: Performed at [facility], [place] [address] [place], [place] [address]  Comprehensive metabolic panel     Status: Abnormal   Collection Time: 08/17/17  5:21 PM  Result Value Ref Range   Sodium 141 135 - 145 mmol/L   Potassium 4.0 3.5 - 5.1 mmol/L   Chloride 105 101 - 111 mmol/L   CO2 27 22 - 32 mmol/L   Glucose, Bld 153 (H) 65 - 99 mg/dL   BUN 19 6 - 20 mg/dL   Creatinine, Ser 0.87 0.44 - 1.00 mg/dL   Calcium 9.2 8.9 - 10.3 mg/dL   Total Protein 6.9 6.5 - 8.1 g/dL   Albumin 3.6 3.5 - 5.0 g/dL   AST 34 15 - 41 U/L   ALT 36 14 - 54 U/L   Alkaline Phosphatase 49 38 - 126 U/L   Total Bilirubin 0.7 0.3 - 1.2 mg/dL   GFR calc non Af  Amer >60 >60 mL/min   GFR calc Af Amer >60 >60 mL/min    Comment: (NOTE) The eGFR has been calculated using the CKD EPI equation. This calculation has not been validated in all clinical situations. eGFR's persistently <60 mL/min signify possible Chronic Kidney Disease.    Anion gap 9 5 - 15    Comment: Performed at [facility], [place] [address], [place], [place] [address]  Ethanol     Status: None   Collection Time: 08/17/17  5:21 PM  Result Value Ref Range   Alcohol, Ethyl (B) <10 <10 mg/dL    Comment: (NOTE) Lowest detectable limit for serum alcohol is 10 mg/dL. For medical purposes only. Performed at [facility], [place] [address], [place], [place] [address]   Salicylate level     Status: None   Collection Time: 08/17/17  5:21 PM  Result Value Ref Range   Salicylate Lvl <8.3 2.8 - 30.0 mg/dL    Comment: Performed at [facility], [place] [address], [place], [place] [address]  Acetaminophen level     Status: Abnormal   Collection Time: 08/17/17  5:21 PM  Result Value Ref Range   Acetaminophen (Tylenol), Serum <10 (L) 10 - 30 ug/mL    Comment: (NOTE) Therapeutic concentrations vary significantly. A range of 10-30 ug/mL  may be an effective concentration for many patients. However, some  are best treated at concentrations outside of this range. Acetaminophen concentrations >150 ug/mL at 4 hours after ingestion  and >50 ug/mL at 12 hours after ingestion are often associated with  toxic reactions. Performed at [facility], [place] [address] [place], [place] [address]   cbc     Status: None   Collection Time: 08/17/17  5:21  PM  Result Value Ref Range   WBC 5.0 4.0 - 10.5 K/uL   RBC 4.76 3.87 - 5.11 MIL/uL   Hemoglobin 14.2 12.0 - 15.0 g/dL   HCT 43.5 36.0 - 46.0 %   MCV 91.4 78.0 - 100.0 fL   MCH 29.8 26.0 - 34.0 pg   MCHC 32.6 30.0 - 36.0 g/dL   RDW 14.5 11.5 - 15.5 %   Platelets 247 150 - 400 K/uL     Comment: Performed at [facility], [place] [address], [place], [place] [address]    Current Facility-Administered Medications  Medication Dose Route Frequency Provider Last Rate Last Dose  . benztropine (COGENTIN) tablet 0.5 mg  0.5 mg Oral BID Deno Etienne, DO   0.5 mg at 08/18/17 1029  . divalproex (DEPAKOTE ER) 24 hr tablet 1,000 mg  1,000 mg Oral BID Deno Etienne, DO   1,000 mg at 08/18/17 1030  . hydrochlorothiazide (MICROZIDE) capsule 12.5 mg  12.5 mg Oral Daily Deno Etienne, DO   12.5 mg at 08/18/17 1030  . lisinopril (PRINIVIL,ZESTRIL) tablet 5 mg  5 mg Oral Daily Deno Etienne, DO   5 mg at 08/18/17 1029  . QUEtiapine (SEROQUEL) tablet 100 mg  100 mg Oral BID Priscille Loveless S, FNP   100 mg at 08/18/17 1129   Current Outpatient Medications  Medication Sig Dispense Refill  . Valbenazine Tosylate (INGREZZA) 40 MG CAPS Take 1 capsule by mouth daily.    . benztropine (COGENTIN) 0.5 MG tablet Take 0.5 mg by mouth 2 (two) times daily.     . divalproex (DEPAKOTE ER) 500 MG 24 hr tablet Take 1 tablet (500 mg total) by mouth 2 (two) times daily. (Patient taking differently: Take 1,000 mg by mouth 2 (two) times daily. )    . hydrochlorothiazide (MICROZIDE) 12.5 MG capsule Take 1 capsule (12.5 mg total) by mouth daily. (Patient not taking: Reported on 08/17/2017) 30 capsule 0  . lisinopril (PRINIVIL,ZESTRIL) 5 MG tablet Take 1 tablet (5 mg total) by mouth daily. 30 tablet 0  . paliperidone (INVEGA SUSTENNA) 156 MG/ML SUSP injection Inject 1 mL (156 mg total) into the muscle every 30 (thirty) days. (Patient not taking: Reported on 12/03/2016) 0.9 mL   . paliperidone (INVEGA SUSTENNA) 234 MG/1.5ML SUSP injection Inject 234 mg into the muscle every 30 (thirty) days.    . paliperidone (INVEGA) 6 MG 24 hr tablet Take 6 mg by mouth daily.    . potassium chloride (K-DUR,KLOR-CON) 10 MEQ tablet Take 1 tablet (10 mEq total) by mouth daily. 30 tablet 0  . QUEtiapine (SEROQUEL) 100 MG tablet Take 100  mg by mouth at bedtime.    . risperiDONE (RISPERDAL) 2 MG tablet Take 1 tablet (2 mg total) by mouth 2 (two) times daily. 60 tablet 0  . topiramate (TOPAMAX) 50 MG tablet Take 50 mg by mouth.    . traZODone (DESYREL) 50 MG tablet Take 50 mg by mouth.      Musculoskeletal: Strength & Muscle Tone: within normal limits Gait & Station: UTA since patient is lying in bed. Patient leans: N/A  Psychiatric Specialty Exam: Physical Exam  Nursing note and vitals reviewed. Constitutional: She is oriented to person, place, and time. She appears well-developed and well-nourished.  HENT:  Head: Normocephalic and atraumatic.  Neck: Normal range of motion.  Respiratory: Effort normal.  Musculoskeletal: Normal range of motion.  Neurological: She is alert and oriented to person, place, and time.  Skin: No rash noted.  Psychiatric:  Her affect is labile. Her speech is tangential. She is agitated. Thought content is paranoid and delusional. Cognition and memory are impaired. She expresses impulsivity.    Review of Systems  Psychiatric/Behavioral: Positive for substance abuse. Negative for suicidal ideas.  All other systems reviewed and are negative.   Blood pressure (!) 154/82, pulse 78, temperature 98.3 F (36.8 C), temperature source Oral, resp. rate 16, last menstrual period 01/13/2012, SpO2 93 %.There is no height or weight on file to calculate BMI.  General Appearance: Disheveled, middle aged, obese, African American female, wearing a paper hospital scrubs and lying in bed. NAD.   Eye Contact:  Good  Speech:  Clear and Coherent and rapid speech  Volume:  Increased  Mood:  Irritable  Affect:  Labile  Thought Process:  Disorganized and Descriptions of Associations: Tangential  Orientation:  Full (Time, Place, and Person)  Thought Content:  Delusions and Paranoid Ideation  Suicidal Thoughts:  No  Homicidal Thoughts:  No  Memory:  Immediate;   Fair Recent;   Fair Remote;   Fair  Judgement:   Impaired  Insight:  Lacking  Psychomotor Activity:  Increased  Concentration:  Concentration: Fair and Attention Span: Fair  Recall:  [employer] of Knowledge:  Fair  Language:  Fair  Akathisia:  No  Handed:  Right  AIMS (if indicated):   N/A  Assets:  Housing  ADL's:  Intact  Cognition: Impaired due to current condition.   Sleep:   N/A   Assessment:  Runell Kovich is a 57 y.o. female who was admitted with psychosis in the setting of cocaine use. She continues to be disorganized in thought process and behavior with labile affect. She warrants inpatient psychiatric hospitalization for stabilization and treatment.   Treatment Plan Summary: -Continue Cogentin 0.5 mg BID for EPS. -Continue Depakote 1000 mg BID for mood stabilization.  -Increase Seroquel 100 mg qhs to 100 mg BID for psychosis.   -Will obtain Depakote level prior to evening dose. -Will order EKG to monitor for QTc prolongation.    Disposition: Recommend psychiatric Inpatient admission when medically cleared.  Faythe Dingwall, DO 08/18/2017 11:33 AM

## 2017-08-18 NOTE — ED Notes (Signed)
Report called to [facility].  [facility] called for transport.

## 2017-08-18 NOTE — ED Notes (Signed)
Bed: [id number] Expected date:  Expected time:  Means of arrival:  Comments: 

## 2017-08-18 NOTE — ED Notes (Signed)
[name] at [facility] called and said that pt has been accepted to [facility]. Accepting doctor is Dr. Wendall StadeKohl. The number to call report is [phone].

## 2017-10-14 ENCOUNTER — Emergency Department (HOSPITAL_COMMUNITY)
Admission: EM | Admit: 2017-10-14 | Discharge: 2017-10-14 | Disposition: A | Discharge status: Home / Self Care | Payer: Medicare Other

## 2017-10-14 NOTE — ED Notes (Signed)
No answer when called for triage 

## 2017-11-05 ENCOUNTER — Encounter (HOSPITAL_COMMUNITY): Payer: Self-pay | Admitting: Emergency Medicine

## 2017-11-05 ENCOUNTER — Emergency Department (HOSPITAL_COMMUNITY)
Admission: EM | Admit: 2017-11-05 | Discharge: 2017-11-05 | Disposition: A | Discharge status: Home / Self Care | Payer: Medicare Other | Attending: Emergency Medicine | Admitting: Emergency Medicine

## 2017-11-05 DIAGNOSIS — F1490 Cocaine use, unspecified, uncomplicated: Secondary | ICD-10-CM | POA: Diagnosis not present

## 2017-11-05 DIAGNOSIS — R45850 Homicidal ideations: Secondary | ICD-10-CM | POA: Diagnosis not present

## 2017-11-05 DIAGNOSIS — Z046 Encounter for general psychiatric examination, requested by authority: Secondary | ICD-10-CM | POA: Insufficient documentation

## 2017-11-05 DIAGNOSIS — Z5329 Procedure and treatment not carried out because of patient's decision for other reasons: Secondary | ICD-10-CM | POA: Diagnosis not present

## 2017-11-05 DIAGNOSIS — I10 Essential (primary) hypertension: Secondary | ICD-10-CM | POA: Insufficient documentation

## 2017-11-05 DIAGNOSIS — R45851 Suicidal ideations: Secondary | ICD-10-CM | POA: Insufficient documentation

## 2017-11-05 DIAGNOSIS — F17210 Nicotine dependence, cigarettes, uncomplicated: Secondary | ICD-10-CM | POA: Insufficient documentation

## 2017-11-05 DIAGNOSIS — F209 Schizophrenia, unspecified: Secondary | ICD-10-CM | POA: Insufficient documentation

## 2017-11-05 DIAGNOSIS — Z79899 Other long term (current) drug therapy: Secondary | ICD-10-CM | POA: Diagnosis not present

## 2017-11-05 DIAGNOSIS — Z7151 Drug abuse counseling and surveillance of drug abuser: Secondary | ICD-10-CM | POA: Diagnosis present

## 2017-11-05 LAB — COMPREHENSIVE METABOLIC PANEL
ALT: 22 U/L (ref 0–44)
AST: 17 U/L (ref 15–41)
Albumin: 3.5 g/dL (ref 3.5–5.0)
Alkaline Phosphatase: 51 U/L (ref 38–126)
Anion gap: 8 (ref 5–15)
BUN: 11 mg/dL (ref 6–20)
CHLORIDE: 107 mmol/L (ref 98–111)
CO2: 27 mmol/L (ref 22–32)
Calcium: 9.3 mg/dL (ref 8.9–10.3)
Creatinine, Ser: 0.81 mg/dL (ref 0.44–1.00)
GFR calc Af Amer: >60 mL/min (ref >60)
GFR calc non Af Amer: >60 mL/min (ref >60)
GLUCOSE: 139 mg/dL — AB (ref 70–99)
POTASSIUM: 4.2 mmol/L (ref 3.5–5.1)
Sodium: 142 mmol/L (ref 135–145)
Total Bilirubin: 0.6 mg/dL (ref 0.3–1.2)
Total Protein: 6.9 g/dL (ref 6.5–8.1)

## 2017-11-05 LAB — CBC
HEMATOCRIT: 45 % (ref 36.0–46.0)
HEMOGLOBIN: 14.5 g/dL (ref 12.0–15.0)
MCH: 29.1 pg (ref 26.0–34.0)
MCHC: 32.2 g/dL (ref 30.0–36.0)
MCV: 90.2 fL (ref 78.0–100.0)
Platelets: 266 10*3/uL (ref 150–400)
RBC: 4.99 MIL/uL (ref 3.87–5.11)
RDW: 14.6 % (ref 11.5–15.5)
WBC: 4.5 10*3/uL (ref 4.0–10.5)

## 2017-11-05 LAB — I-STAT BETA HCG BLOOD, ED (MC, WL, AP ONLY)

## 2017-11-05 LAB — ETHANOL: Alcohol, Ethyl (B): <10 mg/dL (ref <10)

## 2017-11-05 LAB — SALICYLATE LEVEL: Salicylate Lvl: <7 mg/dL (ref 2.8–30.0)

## 2017-11-05 LAB — RAPID URINE DRUG SCREEN, HOSP PERFORMED
AMPHETAMINES: NOT DETECTED
BARBITURATES: NOT DETECTED
BENZODIAZEPINES: NOT DETECTED
Cocaine: POSITIVE — AB
Opiates: NOT DETECTED
TETRAHYDROCANNABINOL: NOT DETECTED

## 2017-11-05 LAB — ACETAMINOPHEN LEVEL: Acetaminophen (Tylenol), Serum: <10 ug/mL — ABNORMAL LOW (ref 10–30)

## 2017-11-05 MED ORDER — ALUM & MAG HYDROXIDE-SIMETH 200-200-20 MG/5ML PO SUSP: 30.0000 mL | Freq: Four times a day (QID) | ORAL | Status: DC | PRN

## 2017-11-05 MED ORDER — ONDANSETRON HCL 4 MG PO TABS: 4.0000 mg | ORAL_TABLET | Freq: Three times a day (TID) | ORAL | Status: DC | PRN

## 2017-11-05 MED ORDER — LORAZEPAM 2 MG/ML IJ SOLN: 0.0000 mg | Freq: Four times a day (QID) | INTRAMUSCULAR | Status: DC

## 2017-11-05 MED ORDER — VITAMIN B-1 100 MG PO TABS: 100.0000 mg | ORAL_TABLET | Freq: Every day | ORAL | Status: DC

## 2017-11-05 MED ORDER — LORAZEPAM 1 MG PO TABS: 0.0000 mg | ORAL_TABLET | Freq: Four times a day (QID) | ORAL | Status: DC

## 2017-11-05 MED ORDER — LORAZEPAM 1 MG PO TABS: 0.0000 mg | ORAL_TABLET | Freq: Two times a day (BID) | ORAL | Status: DC

## 2017-11-05 MED ORDER — NICOTINE 21 MG/24HR TD PT24: 21.0000 mg | MEDICATED_PATCH | Freq: Every day | TRANSDERMAL | Status: DC

## 2017-11-05 MED ORDER — ACETAMINOPHEN 325 MG PO TABS: 650.0000 mg | ORAL_TABLET | ORAL | Status: DC | PRN

## 2017-11-05 MED ORDER — LORAZEPAM 2 MG/ML IJ SOLN
0.0000 mg | Freq: Two times a day (BID) | INTRAMUSCULAR | Status: DC
Start: 2017-11-07 — End: 2017-11-05

## 2017-11-05 MED ORDER — THIAMINE HCL 100 MG/ML IJ SOLN: 100.0000 mg | Freq: Every day | INTRAMUSCULAR | Status: DC

## 2017-11-05 NOTE — ED Notes (Signed)
Pt not in room when attempting to complete orders. Pt not found in waiting room.

## 2017-11-05 NOTE — ED Provider Notes (Signed)
[facility]-EMERGENCY DEPT Provider Note   CSN: [id number] Arrival date & time: 11/05/17  1229     History   Chief Complaint Chief Complaint  Patient presents with  . Suicidal  . Homicidal    HPI [name] is a 57 y.o. female.  57 year old female presents with request for detox from alcohol and crack.  Patient last had alcohol to drink last night, last crack use was 1 week ago.  Patient denies suicidal or homicidal ideation.  Patient states that she has children and 5 grandchildren and needs rehab to get clean. Patient denies other drug use, auditory or visual hallucinations. Denies any other complaints or concerns.      Past Medical History:  Diagnosis Date  . Arthritis   . Bipolar 1 disorder (HCC)   . Hypertension   . Obesity   . Psychiatric illness   . Schizo-affective schizophrenia (HCC)   . Seizures (HCC)   . Substance abuse [facility])     Patient Active Problem List   Diagnosis Date Noted  . Substance induced mood disorder (HCC) 12/27/2015  . Fever, unspecified 09/13/2015  . Leukocytosis 09/13/2015  . Acute encephalopathy 09/13/2015  . Substance abuse (HCC) 09/12/2015  . Hallucination   . Homicidal ideation   . Schizophrenia, unspecified type (HCC)   . Schizoaffective disorder, bipolar type (HCC)   . Schizoaffective disorder (HCC) 03/29/2013  . Cocaine abuse (HCC) 06/30/2011  . Non-compliant patient 06/29/2011  . Hypertension 06/29/2011  . Alcohol dependence (HCC) 06/28/2011  . Polysubstance dependence (HCC) 04/03/2011  . Psychiatric problem 04/03/2011  . Polysubstance abuse (HCC) 06/21/2010  . Schizophrenia (HCC) 06/21/2010    Past Surgical History:  Procedure Laterality Date  . STOMACH SURGERY       OB History   None      Home Medications    Prior to Admission medications   Medication Sig Start Date End Date Taking? Authorizing Provider  divalproex (DEPAKOTE ER) 500 MG 24 hr tablet Take 1 tablet (500 mg total) by  mouth 2 (two) times daily. Patient taking differently: Take 1,000 mg by mouth 2 (two) times daily.  12/28/15   Thermon Leyland, NP  hydrochlorothiazide (MICROZIDE) 12.5 MG capsule Take 1 capsule (12.5 mg total) by mouth daily. Patient not taking: Reported on 08/17/2017 12/28/15   Thermon Leyland, NP  lisinopril (PRINIVIL,ZESTRIL) 5 MG tablet Take 1 tablet (5 mg total) by mouth daily. 12/28/15   Thermon Leyland, NP  paliperidone (INVEGA SUSTENNA) 156 MG/ML SUSP injection Inject 1 mL (156 mg total) into the muscle every 30 (thirty) days. Patient not taking: Reported on 12/03/2016 12/28/15   Thermon Leyland, NP  paliperidone (INVEGA SUSTENNA) 234 MG/1.5ML SUSP injection Inject 234 mg into the muscle every 30 (thirty) days.    [provider]  paliperidone (INVEGA) 6 MG 24 hr tablet Take 6 mg by mouth daily.    [provider]  potassium chloride (K-DUR,KLOR-CON) 10 MEQ tablet Take 1 tablet (10 mEq total) by mouth daily. 12/28/15   Thermon Leyland, NP  QUEtiapine (SEROQUEL) 100 MG tablet Take 100 mg by mouth at bedtime.    [provider]  risperiDONE (RISPERDAL) 2 MG tablet Take 1 tablet (2 mg total) by mouth 2 (two) times daily. 12/28/15   Thermon Leyland, NP  topiramate (TOPAMAX) 50 MG tablet Take 50 mg by mouth. 09/16/16   [provider]  traZODone (DESYREL) 50 MG tablet Take 50 mg by mouth. 09/16/16   [provider]  Valbenazine Tosylate (INGREZZA) 40 MG CAPS Take 1 capsule by mouth daily.    [provider]    Family History Family History  Problem Relation Age of Onset  . Alzheimer's disease Mother   . Depression Mother     Social History Social History   Tobacco Use  . Smoking status: Current Every Day Smoker    Packs/day: 1.00    Types: Cigarettes  . Smokeless tobacco: Never Used  Substance Use Topics  . Alcohol use: Yes    Alcohol/week: 6.0 standard drinks    Types: 6 Cans of beer per week  . Drug use: Yes    Frequency: 5.0 times  per week    Types: "Crack" cocaine, Cocaine    Comment: crack/cocaine     Allergies   Patient has no known allergies.   Review of Systems Review of Systems  Constitutional: Negative for chills and fever.  Respiratory: Negative for shortness of breath.   Cardiovascular: Negative for chest pain.  Gastrointestinal: Negative for abdominal pain, constipation, diarrhea, nausea and vomiting.  Genitourinary: Negative for difficulty urinating, dysuria and frequency.  Musculoskeletal: Negative for arthralgias and myalgias.  Skin: Negative for rash and wound.  Allergic/Immunologic: Negative for immunocompromised state.  Neurological: Negative for weakness and headaches.  Hematological: Does not bruise/bleed easily.  Psychiatric/Behavioral: Negative for agitation, behavioral problems, confusion, hallucinations, self-injury and suicidal ideas.  All other systems reviewed and are negative.    Physical Exam Updated Vital Signs BP (!) 142/78 (BP Location: Right Arm)   Pulse 86   Temp 98.6 F (37 C) (Oral)   Resp 19   LMP 01/13/2012   SpO2 99%   Physical Exam  Constitutional: She is oriented to person, place, and time. She appears well-developed and well-nourished. No distress.  HENT:  Head: Normocephalic and atraumatic.  Mouth/Throat: No oropharyngeal exudate.  Cardiovascular: Normal rate, regular rhythm, normal heart sounds and intact distal pulses.  No murmur heard. Pulmonary/Chest: Effort normal and breath sounds normal. No respiratory distress.  Abdominal: Soft. She exhibits no distension. There is no tenderness.  Musculoskeletal: She exhibits no tenderness.  Neurological: She is alert and oriented to person, place, and time.  Skin: Skin is warm and dry. No rash noted. She is not diaphoretic.  Psychiatric: She has a normal mood and affect. She is hyperactive. Cognition and memory are normal. She expresses impulsivity. She expresses no homicidal and no suicidal ideation.  Nursing  note and vitals reviewed.    ED Treatments / Results  Labs (all labs ordered are listed, but only abnormal results are displayed) Labs Reviewed  COMPREHENSIVE METABOLIC PANEL - Abnormal; Notable for the following components:      Result Value   Glucose, Bld 139 (*)    All other components within normal limits  ACETAMINOPHEN LEVEL - Abnormal; Notable for the following components:   Acetaminophen (Tylenol), Serum <10 (*)    All other components within normal limits  RAPID URINE DRUG SCREEN, HOSP PERFORMED - Abnormal; Notable for the following components:   Cocaine POSITIVE (*)    All other components within normal limits  ETHANOL  SALICYLATE LEVEL  CBC  I-STAT BETA HCG BLOOD, ED ([facility], [facility], [facility] ONLY)    EKG None  Radiology No results found.  Procedures Procedures (including critical care time)  Medications Ordered in ED Medications  LORazepam (ATIVAN) injection 0-4 mg (has no administration in time range)    Or  LORazepam (ATIVAN) tablet 0-4 mg (has no administration in time  range)  LORazepam (ATIVAN) injection 0-4 mg (has no administration in time range)    Or  LORazepam (ATIVAN) tablet 0-4 mg (has no administration in time range)  thiamine (VITAMIN B-1) tablet 100 mg (has no administration in time range)    Or  thiamine (B-1) injection 100 mg (has no administration in time range)  acetaminophen (TYLENOL) tablet 650 mg (has no administration in time range)  ondansetron (ZOFRAN) tablet 4 mg (has no administration in time range)  alum & mag hydroxide-simeth (MAALOX/MYLANTA) 200-200-20 MG/5ML suspension 30 mL (has no administration in time range)  nicotine (NICODERM CQ - dosed in mg/24 hours) patch 21 mg (has no administration in time range)     Initial Impression / Assessment and Plan / ED Course  I have reviewed the triage vital signs and the nursing notes.  Pertinent labs & imaging results that were available during my care of the patient were reviewed by me and  considered in my medical decision making (see chart for details).  Clinical Course as of Nov 06 1450  Sun Nov 05, 2017  [id number] 57 year old female with extensive psychiatric history presents requesting detox from crack and alcohol.  No medical complaints at this time, denies suicidal homicidal ideation on my evaluation however reported suicidal homicidal ideation to EMS and nurse prior to my evaluation today.  Patient has been evaluated and is cleared medically for psychiatric evaluation.   [LM]  1403 Review of lab work shows urine drug screen positive for cocaine, patient is not pregnant, CBC unremarkable, Tylenol, ethanol, states that levels all unremarkable, CMP normal.   [LM]  1450 Patient not found in triage area, is thought to have left. Patient denies Si, HI on my assessment, was looking for somewhere to stay as she is about to be evicted on 11/09/17. Patient is most concerned about the eviction as she has several people that live with her that she is helping out.    [LM]    Clinical Course User Index [LM] Jeannie FendMurphy, Aulton Routt A, PA-C   Final Clinical Impressions(s) / ED Diagnoses   Final diagnoses:  Cocaine use  Schizophrenia, unspecified type [facility](HCC)    ED Discharge Orders    None       Jeannie FendMurphy, Shuna Tabor A, PA-C 11/05/17 1452    Sabas SousBero, Michael M, MD 11/05/17 [id number]

## 2017-11-05 NOTE — ED Triage Notes (Signed)
Patient here from home via EMS with complaints of SI and HI. Requesting detox from crack and alcohol. Last use alcohol yesterday and crack last week.

## 2017-11-12 ENCOUNTER — Emergency Department (HOSPITAL_COMMUNITY)
Admission: EM | Admit: 2017-11-12 | Discharge: 2017-11-12 | Disposition: A | Discharge status: Home / Self Care | Payer: Medicare Other | Attending: Emergency Medicine | Admitting: Emergency Medicine

## 2017-11-12 ENCOUNTER — Encounter (HOSPITAL_COMMUNITY): Payer: Self-pay | Admitting: Emergency Medicine

## 2017-11-12 DIAGNOSIS — F319 Bipolar disorder, unspecified: Secondary | ICD-10-CM | POA: Diagnosis not present

## 2017-11-12 DIAGNOSIS — F1410 Cocaine abuse, uncomplicated: Secondary | ICD-10-CM | POA: Diagnosis not present

## 2017-11-12 DIAGNOSIS — F17210 Nicotine dependence, cigarettes, uncomplicated: Secondary | ICD-10-CM | POA: Insufficient documentation

## 2017-11-12 DIAGNOSIS — I10 Essential (primary) hypertension: Secondary | ICD-10-CM

## 2017-11-12 DIAGNOSIS — Z79899 Other long term (current) drug therapy: Secondary | ICD-10-CM | POA: Diagnosis not present

## 2017-11-12 DIAGNOSIS — F1010 Alcohol abuse, uncomplicated: Secondary | ICD-10-CM | POA: Diagnosis present

## 2017-11-12 DIAGNOSIS — F209 Schizophrenia, unspecified: Secondary | ICD-10-CM | POA: Insufficient documentation

## 2017-11-12 LAB — CBC
HCT: 42.1 % (ref 36.0–46.0)
Hemoglobin: 13.7 g/dL (ref 12.0–15.0)
MCH: 29.3 pg (ref 26.0–34.0)
MCHC: 32.5 g/dL (ref 30.0–36.0)
MCV: 90 fL (ref 78.0–100.0)
Platelets: 321 10*3/uL (ref 150–400)
RBC: 4.68 MIL/uL (ref 3.87–5.11)
RDW: 14.5 % (ref 11.5–15.5)
WBC: 5.4 10*3/uL (ref 4.0–10.5)

## 2017-11-12 LAB — COMPREHENSIVE METABOLIC PANEL
ALT: 17 U/L (ref 0–44)
AST: 14 U/L — AB (ref 15–41)
Albumin: 3.4 g/dL — ABNORMAL LOW (ref 3.5–5.0)
Alkaline Phosphatase: 50 U/L (ref 38–126)
Anion gap: 10 (ref 5–15)
BUN: 13 mg/dL (ref 6–20)
CALCIUM: 9.1 mg/dL (ref 8.9–10.3)
CO2: 29 mmol/L (ref 22–32)
CREATININE: 0.85 mg/dL (ref 0.44–1.00)
Chloride: 105 mmol/L (ref 98–111)
Glucose, Bld: 109 mg/dL — ABNORMAL HIGH (ref 70–99)
Potassium: 4 mmol/L (ref 3.5–5.1)
Sodium: 144 mmol/L (ref 135–145)
Total Bilirubin: 0.4 mg/dL (ref 0.3–1.2)
Total Protein: 7.1 g/dL (ref 6.5–8.1)

## 2017-11-12 LAB — RAPID URINE DRUG SCREEN, HOSP PERFORMED
Amphetamines: NOT DETECTED
BARBITURATES: NOT DETECTED
BENZODIAZEPINES: NOT DETECTED
COCAINE: POSITIVE — AB
OPIATES: NOT DETECTED
Tetrahydrocannabinol: NOT DETECTED

## 2017-11-12 LAB — ETHANOL

## 2017-11-12 NOTE — ED Provider Notes (Signed)
[facility]-EMERGENCY DEPT Provider Note   CSN: [id number] Arrival date & time: 11/12/17  1544     History   Chief Complaint Chief Complaint  Patient presents with  . detox    HPI [name] is a 57 y.o. female.  HPI   She is here for "rehab from alcohol and cocaine."  She states that she was in court 4 days ago, and while there to talked to her ACT counselor who recommended, with the judge, that she go to rehab.  She has recently lost her domicile.  She was staying with friends last night and complains of a rash on her breasts and arms, that she feels is from bedbugs.  She denies nausea, vomiting, fever, chills, chest pain, shortness of breath, weakness or dizziness.  There are no other known modifying factors.  Past Medical History:  Diagnosis Date  . Arthritis   . Bipolar 1 disorder (HCC)   . Hypertension   . Obesity   . Psychiatric illness   . Schizo-affective schizophrenia (HCC)   . Seizures (HCC)   . Substance abuse [facility])     Patient Active Problem List   Diagnosis Date Noted  . Substance induced mood disorder (HCC) 12/27/2015  . Fever, unspecified 09/13/2015  . Leukocytosis 09/13/2015  . Acute encephalopathy 09/13/2015  . Substance abuse (HCC) 09/12/2015  . Hallucination   . Homicidal ideation   . Schizophrenia, unspecified type (HCC)   . Schizoaffective disorder, bipolar type (HCC)   . Schizoaffective disorder (HCC) 03/29/2013  . Cocaine abuse (HCC) 06/30/2011  . Non-compliant patient 06/29/2011  . Hypertension 06/29/2011  . Alcohol dependence (HCC) 06/28/2011  . Polysubstance dependence (HCC) 04/03/2011  . Psychiatric problem 04/03/2011  . Polysubstance abuse (HCC) 06/21/2010  . Schizophrenia (HCC) 06/21/2010    Past Surgical History:  Procedure Laterality Date  . STOMACH SURGERY       OB History   None      Home Medications    Prior to Admission medications   Medication Sig Start Date End Date Taking? Authorizing  Provider  divalproex (DEPAKOTE ER) 500 MG 24 hr tablet Take 1 tablet (500 mg total) by mouth 2 (two) times daily. Patient taking differently: Take 1,000 mg by mouth 2 (two) times daily.  12/28/15   Thermon Leyland, NP  hydrochlorothiazide (MICROZIDE) 12.5 MG capsule Take 1 capsule (12.5 mg total) by mouth daily. Patient not taking: Reported on 08/17/2017 12/28/15   Thermon Leyland, NP  lisinopril (PRINIVIL,ZESTRIL) 5 MG tablet Take 1 tablet (5 mg total) by mouth daily. 12/28/15   Thermon Leyland, NP  paliperidone (INVEGA SUSTENNA) 156 MG/ML SUSP injection Inject 1 mL (156 mg total) into the muscle every 30 (thirty) days. Patient not taking: Reported on 12/03/2016 12/28/15   Thermon Leyland, NP  paliperidone (INVEGA SUSTENNA) 234 MG/1.5ML SUSP injection Inject 234 mg into the muscle every 30 (thirty) days.    [provider]  paliperidone (INVEGA) 6 MG 24 hr tablet Take 6 mg by mouth daily.    [provider]  potassium chloride (K-DUR,KLOR-CON) 10 MEQ tablet Take 1 tablet (10 mEq total) by mouth daily. 12/28/15   Thermon Leyland, NP  QUEtiapine (SEROQUEL) 100 MG tablet Take 100 mg by mouth at bedtime.    [provider]  risperiDONE (RISPERDAL) 2 MG tablet Take 1 tablet (2 mg total) by mouth 2 (two) times daily. 12/28/15   Thermon Leyland, NP  topiramate (TOPAMAX) 50 MG tablet Take 50  mg by mouth. 09/16/16   [provider]  traZODone (DESYREL) 50 MG tablet Take 50 mg by mouth. 09/16/16   [provider]  Valbenazine Tosylate (INGREZZA) 40 MG CAPS Take 1 capsule by mouth daily.    [provider]    Family History Family History  Problem Relation Age of Onset  . Alzheimer's disease Mother   . Depression Mother     Social History Social History   Tobacco Use  . Smoking status: Current Every Day Smoker    Packs/day: 1.00    Types: Cigarettes  . Smokeless tobacco: Never Used  Substance Use Topics  . Alcohol use: Yes    Alcohol/week: 6.0  standard drinks    Types: 6 Cans of beer per week  . Drug use: Yes    Frequency: 5.0 times per week    Types: "Crack" cocaine, Cocaine    Comment: crack/cocaine     Allergies   Patient has no known allergies.   Review of Systems Review of Systems  All other systems reviewed and are negative.    Physical Exam Updated Vital Signs BP (!) 171/99 (BP Location: Left Arm)   Pulse 99   Temp 98.1 F (36.7 C) (Oral)   Resp 20   LMP 01/13/2012   SpO2 98%   Physical Exam  Constitutional: She is oriented to person, place, and time. She appears well-developed.  Obese, appears older than stated age.  HENT:  Head: Normocephalic and atraumatic.  Eyes: Pupils are equal, round, and reactive to light. Conjunctivae and EOM are normal.  Neck: Normal range of motion and phonation normal. Neck supple.  Cardiovascular: Normal rate.  Pulmonary/Chest: Effort normal. She exhibits no tenderness.  Musculoskeletal: Normal range of motion.  Neurological: She is alert and oriented to person, place, and time. She exhibits normal muscle tone.  Skin: Skin is warm and dry.  Diffuse rash on arms, and torso, characterized by small reddened areas, 2 to 5 mm in diameter.  Mild excoriation associated.  No drainage, vesicles, petechiae or fluctuance.  Psychiatric: She has a normal mood and affect. Her behavior is normal. Judgment and thought content normal.  She is not responding to internal stimuli  Nursing note and vitals reviewed.    ED Treatments / Results  Labs (all labs ordered are listed, but only abnormal results are displayed) Labs Reviewed  COMPREHENSIVE METABOLIC PANEL - Abnormal; Notable for the following components:      Result Value   Glucose, Bld 109 (*)    Albumin 3.4 (*)    AST 14 (*)    All other components within normal limits  RAPID URINE DRUG SCREEN, HOSP PERFORMED - Abnormal; Notable for the following components:   Cocaine POSITIVE (*)    All other components within normal  limits  ETHANOL  CBC    EKG None  Radiology No results found.  Procedures Procedures (including critical care time)  Medications Ordered in ED Medications - No data to display   Initial Impression / Assessment and Plan / ED Course  I have reviewed the triage vital signs and the nursing notes.  Pertinent labs & imaging results that were available during my care of the patient were reviewed by me and considered in my medical decision making (see chart for details).  Clinical Course as of Nov 12 2009  Sun Nov 12, 2017  1733 Normal except glucose high, albumin low, AST low  Comprehensive metabolic panel(!) [EW]  1733 Normal  Ethanol [EW]  1733  Normal  cbc [EW]  1733 Normal except cocaine present  Rapid urine drug screen (hospital performed)(!) [EW]  1903 At this time the patient was seen by pharmacy services, for medicine reconciliation.  She refused to discuss her medications with pharmacy technician.  The patient had asked the technician to leave the room.   [EW]    Clinical Course User Index [EW] Mancel Bale, MD     Patient Vitals for the past 24 hrs:  BP Temp Temp src Pulse Resp SpO2  11/12/17 1807 (!) 171/99 98.1 F (36.7 C) Oral 99 20 98 %  11/12/17 1552 (!) 153/114 98.9 F (37.2 C) Oral 92 20 95 %   8:05 PM-discussed the case with department social services who state that the patient is no longer a client of theirs.  She has become her own guardian as of November 09, 2017.  7:04 PM Reevaluation with update and discussion. After initial assessment and treatment, an updated evaluation reveals the patient states that she has her blood pressure medicine at home and plans to take it.  She understands that she needs to follow-up with outpatient rehab and has a list in her hands.  All questions answered. Mancel Bale   Medical Decision Making: Substance abuse with alcohol and cocaine.  No unstable psychiatric condition.  Nonspecific rash, possible bedbug  bites.  CRITICAL CARE-no Performed by: Mancel Bale   Nursing Notes Reviewed/ Care Coordinated Applicable Imaging Reviewed Interpretation of Laboratory Data incorporated into ED treatment  The patient appears reasonably screened and/or stabilized for discharge and I doubt any other medical condition or other [facility] requiring further screening, evaluation, or treatment in the ED at this time prior to discharge.  Plan: Home Medications-continue usual medications; Home Treatments-avoid alcohol and cocaine; return here if the recommended treatment, does not improve the symptoms; Recommended follow up-follow-up with rehab of choice.  See PCP for blood pressure check 1 week.     Final Clinical Impressions(s) / ED Diagnoses   Final diagnoses:  Cocaine abuse (HCC)  Hypertension, unspecified type    ED Discharge Orders    None       Mancel Bale, MD 11/12/17 2011

## 2017-11-12 NOTE — ED Triage Notes (Addendum)
Pt states she has  Been using alcohol and cocaine and feels its time for detox. Pt states she last used alcohol and drugs yesterday. Pt also states she has bug bites on arms. Pt states she hasn't been taking meds for bipolar. Pt states she hasn't eaten in 3 days and has epigastric burning. Denies SI / HI.

## 2017-11-12 NOTE — Discharge Instructions (Addendum)
Do not use Cocaine.  Take your blood pressure medicine.  See your doctor for a blood pressure check, next week.  Follow up with one of the Rehab facilities as soon as possible.

## 2017-11-12 NOTE — ED Notes (Signed)
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 I kindly refused to draw her blood and let RN know I will ask someone else because I will not be threatened by a patient when I am not physically able to move fast enough to get myself out of potential harms way.

## 2017-11-12 NOTE — Patient Outreach (Signed)
[facility] met with the patient to provide recovery support and help with recovery resources. Patient was interested in residential substance use treatment. CPSS tried [facility] and they currently do not have any beds available and to call back tomorrow. [facility] does not do any admissions on the weekends. CPSS provided recovery resources including NA meeting list, residential/outpatient substance use treatment center list, and [facility]PSS contact information. Patient was very rude to [place] and would continuously curse at [place] when trying to provide and explain information regarding recovery resources. [facility]PSS was able to coordinate transportation with her ACTT team at [facility] to pick her up in an hour and a half per patient request.

## 2018-04-15 ENCOUNTER — Emergency Department (HOSPITAL_COMMUNITY)
Admission: EM | Admit: 2018-04-15 | Discharge: 2018-04-15 | Disposition: A | Discharge status: Home / Self Care | Payer: Medicare Other | Attending: Emergency Medicine | Admitting: Emergency Medicine

## 2018-04-15 ENCOUNTER — Encounter (HOSPITAL_COMMUNITY): Payer: Self-pay | Admitting: Obstetrics and Gynecology

## 2018-04-15 ENCOUNTER — Other Ambulatory Visit: Payer: Self-pay

## 2018-04-15 DIAGNOSIS — F17210 Nicotine dependence, cigarettes, uncomplicated: Secondary | ICD-10-CM | POA: Diagnosis not present

## 2018-04-15 DIAGNOSIS — Z79899 Other long term (current) drug therapy: Secondary | ICD-10-CM | POA: Diagnosis not present

## 2018-04-15 DIAGNOSIS — F10929 Alcohol use, unspecified with intoxication, unspecified: Secondary | ICD-10-CM

## 2018-04-15 DIAGNOSIS — F10229 Alcohol dependence with intoxication, unspecified: Secondary | ICD-10-CM | POA: Diagnosis not present

## 2018-04-15 DIAGNOSIS — G8929 Other chronic pain: Secondary | ICD-10-CM

## 2018-04-15 DIAGNOSIS — I10 Essential (primary) hypertension: Secondary | ICD-10-CM | POA: Insufficient documentation

## 2018-04-15 DIAGNOSIS — M25562 Pain in left knee: Secondary | ICD-10-CM

## 2018-04-15 DIAGNOSIS — F10129 Alcohol abuse with intoxication, unspecified: Secondary | ICD-10-CM | POA: Diagnosis present

## 2018-04-15 NOTE — Discharge Instructions (Addendum)
Avoid drinking all forms of alcohol.  Follow-up for treatment of 1 of the facilities listed on the resource guide.  For pain use Tylenol every 4 hours.  Use the knee brace that was given to help your discomfort.

## 2018-04-15 NOTE — ED Notes (Signed)
RN attempted to give pt a knee sleeve, pt became frustrated and began fussing at RN about the fact that we did not take a urine sample or blood work. RN stopped trying to give pt a knee sleeve.

## 2018-04-15 NOTE — ED Provider Notes (Signed)
[facility]-EMERGENCY DEPT Provider Note   CSN: [id number] Arrival date & time: 04/15/18  [id number]     History   Chief Complaint Chief Complaint  Patient presents with  . Alcohol Intoxication  . Leg Pain    HPI [name] is a 58 y.o. female.  HPI   Patient presents for knee pain, for 1 month.  She admits to drinking "a case of beer last night."  She states she is visiting a friend in [place], and had hoped that the ambulance will take her to [facility] for evaluation.  She states that she lives in [place].  She denies recent trauma.  She states that she wants rehab for alcohol abuse.  She states that she "pops any kind of pill I can find."  She admits to having bipolar disorder and taking monthly Invega shots.  She denies other problems at this time.  There are no other known modifying factors.  Past Medical History:  Diagnosis Date  . Arthritis   . Bipolar 1 disorder (HCC)   . Hypertension   . Obesity   . Psychiatric illness   . Schizo-affective schizophrenia (HCC)   . Seizures (HCC)   . Substance abuse [facility](HCC)     Patient Active Problem List   Diagnosis Date Noted  . Substance induced mood disorder (HCC) 12/27/2015  . Fever, unspecified 09/13/2015  . Leukocytosis 09/13/2015  . Acute encephalopathy 09/13/2015  . Substance abuse (HCC) 09/12/2015  . Hallucination   . Homicidal ideation   . Schizophrenia, unspecified type (HCC)   . Schizoaffective disorder, bipolar type (HCC)   . Schizoaffective disorder (HCC) 03/29/2013  . Cocaine abuse (HCC) 06/30/2011  . Non-compliant patient 06/29/2011  . Hypertension 06/29/2011  . Alcohol dependence (HCC) 06/28/2011  . Polysubstance dependence (HCC) 04/03/2011  . Psychiatric problem 04/03/2011  . Polysubstance abuse (HCC) 06/21/2010  . Schizophrenia (HCC) 06/21/2010    Past Surgical History:  Procedure Laterality Date  . STOMACH SURGERY       OB History   No obstetric history on file.      Home Medications    Prior to Admission medications   Medication Sig Start Date End Date Taking? Authorizing Provider  divalproex (DEPAKOTE ER) 500 MG 24 hr tablet Take 1 tablet (500 mg total) by mouth 2 (two) times daily. Patient taking differently: Take 1,000 mg by mouth 2 (two) times daily.  12/28/15   Thermon Leylandavis, Laura A, NP  hydrochlorothiazide (MICROZIDE) 12.5 MG capsule Take 1 capsule (12.5 mg total) by mouth daily. Patient not taking: Reported on 08/17/2017 12/28/15   Thermon Leylandavis, Laura A, NP  lisinopril (PRINIVIL,ZESTRIL) 5 MG tablet Take 1 tablet (5 mg total) by mouth daily. 12/28/15   Thermon Leylandavis, Laura A, NP  paliperidone (INVEGA SUSTENNA) 156 MG/ML SUSP injection Inject 1 mL (156 mg total) into the muscle every 30 (thirty) days. Patient not taking: Reported on 12/03/2016 12/28/15   Thermon Leylandavis, Laura A, NP  paliperidone (INVEGA SUSTENNA) 234 MG/1.5ML SUSP injection Inject 234 mg into the muscle every 30 (thirty) days.    [provider]  paliperidone (INVEGA) 6 MG 24 hr tablet Take 6 mg by mouth daily.    [provider]  potassium chloride (K-DUR,KLOR-CON) 10 MEQ tablet Take 1 tablet (10 mEq total) by mouth daily. 12/28/15   Thermon Leylandavis, Laura A, NP  QUEtiapine (SEROQUEL) 100 MG tablet Take 100 mg by mouth at bedtime.    [provider]  risperiDONE (RISPERDAL) 2 MG tablet Take 1 tablet (  2 mg total) by mouth 2 (two) times daily. 12/28/15   Thermon Leyland, NP  topiramate (TOPAMAX) 50 MG tablet Take 50 mg by mouth. 09/16/16   [provider]  traZODone (DESYREL) 50 MG tablet Take 50 mg by mouth. 09/16/16   [provider]  Valbenazine Tosylate (INGREZZA) 40 MG CAPS Take 1 capsule by mouth daily.    [provider]    Family History Family History  Problem Relation Age of Onset  . Alzheimer's disease Mother   . Depression Mother     Social History Social History   Tobacco Use  . Smoking status: Current Every Day Smoker    Packs/day: 1.00    Types:  Cigarettes  . Smokeless tobacco: Never Used  Substance Use Topics  . Alcohol use: Yes    Alcohol/week: 6.0 standard drinks    Types: 6 Cans of beer per week  . Drug use: Yes    Frequency: 5.0 times per week    Types: "Crack" cocaine, Cocaine    Comment: crack/cocaine     Allergies   Patient has no known allergies.   Review of Systems Review of Systems  All other systems reviewed and are negative.    Physical Exam Updated Vital Signs BP 128/88   Pulse 99   Temp 98.8 F (37.1 C)   Resp 15   LMP 01/13/2012   SpO2 95%   Physical Exam Vitals signs and nursing note reviewed.  Constitutional:      General: She is not in acute distress.    Appearance: She is well-developed. She is obese. She is not ill-appearing, toxic-appearing or diaphoretic.  HENT:     Head: Normocephalic and atraumatic.     Right Ear: External ear normal.     Left Ear: External ear normal.  Eyes:     Conjunctiva/sclera: Conjunctivae normal.     Pupils: Pupils are equal, round, and reactive to light.  Neck:     Musculoskeletal: Normal range of motion and neck supple.     Trachea: Phonation normal.  Cardiovascular:     Rate and Rhythm: Normal rate.  Pulmonary:     Effort: Pulmonary effort is normal.  Musculoskeletal:     Comments: Left knee is slightly tender to palpation but she exhibits normal active and passive range of motion of the left knee.  No other evident deformity of the arms or right leg.  Skin:    General: Skin is warm and dry.  Neurological:     Mental Status: She is alert and oriented to person, place, and time.     Cranial Nerves: No cranial nerve deficit.     Sensory: No sensory deficit.     Motor: No abnormal muscle tone.     Coordination: Coordination normal.     Comments: Mild dysarthria is present.  No aphasia or nystagmus.  Psychiatric:        Attention and Perception: She is attentive. She does not perceive auditory or visual hallucinations.        Mood and Affect:  Mood normal.        Speech: She is communicative.        Behavior: Behavior normal. Behavior is not agitated, aggressive, hyperactive or combative.        Thought Content: Thought content is not paranoid or delusional.        Cognition and Memory: Memory is not impaired.        Judgment: Judgment is not impulsive or inappropriate.  ED Treatments / Results  Labs (all labs ordered are listed, but only abnormal results are displayed) Labs Reviewed - No data to display  EKG None  Radiology No results found.  Procedures Procedures (including critical care time)  Medications Ordered in ED Medications - No data to display   Initial Impression / Assessment and Plan / ED Course  I have reviewed the triage vital signs and the nursing notes.  Pertinent labs & imaging results that were available during my care of the patient were reviewed by me and considered in my medical decision making (see chart for details).  Clinical Course as of Apr 15 908  Sun Apr 15, 2018  0910 Patient sitting up, awake, alert and has called the crisis line to pick her up and transport her home.   [EW]    Clinical Course User Index [EW] Mancel Bale, MD     Patient Vitals for the past 24 hrs:  BP Temp Pulse Resp SpO2  04/15/18 0822 128/88 98.8 F (37.1 C) 99 15 95 %  04/15/18 0819 - - - - 96 %    9:10 AM Reevaluation with update and discussion. After initial assessment and treatment, an updated evaluation reveals she remains cooperative and has no further complaints.  Findings discussed and questions answered. Mancel Bale   Medical Decision Making: Nonspecific knee pain.  Patient appears intoxicated.  Chart review indicates frequent ED evaluations in [facility] for left knee pain, and polysubstance abuse and alcoholism.  There is no indication for further ED treatment or evaluation at this time.  Patient is a candidate for outpatient treatment of alcoholism.  CRITICAL  CARE-no Performed by: Mancel Bale  Nursing Notes Reviewed/ Care Coordinated Applicable Imaging Reviewed Interpretation of Laboratory Data incorporated into ED treatment  The patient appears reasonably screened and/or stabilized for discharge and I doubt any other medical condition or other [facility] requiring further screening, evaluation, or treatment in the ED at this time prior to discharge.  Plan: Home Medications-Tylenol as needed for knee pain.; Home Treatments-knee sleeve for comfort, heat on affected area; return here if the recommended treatment, does not improve the symptoms; Recommended follow up-BCP,    Final Clinical Impressions(s) / ED Diagnoses   Final diagnoses:  Alcoholic intoxication with complication (HCC)  Chronic pain of left knee    ED Discharge Orders    None       Mancel Bale, MD 04/15/18 [phone]

## 2018-04-15 NOTE — ED Triage Notes (Signed)
Per EMS: Pt is coming from a friend's house and reports she was drinking all night and was having left leg pain. Pt walked to the ambulance and reports she wants detox.

## 2018-04-15 NOTE — ED Notes (Signed)
Bed: [facility] Expected date:  Expected time:  Means of arrival:  Comments: EMS Leg pain and ETOH Detox

## 2018-04-28 ENCOUNTER — Other Ambulatory Visit: Payer: Self-pay

## 2018-04-28 ENCOUNTER — Encounter (HOSPITAL_COMMUNITY): Payer: Self-pay | Admitting: Emergency Medicine

## 2018-04-28 ENCOUNTER — Emergency Department (HOSPITAL_COMMUNITY)
Admission: EM | Admit: 2018-04-28 | Discharge: 2018-04-28 | Disposition: A | Discharge status: Home / Self Care | Payer: Medicare Other | Attending: Emergency Medicine | Admitting: Emergency Medicine

## 2018-04-28 DIAGNOSIS — M79604 Pain in right leg: Secondary | ICD-10-CM | POA: Insufficient documentation

## 2018-04-28 DIAGNOSIS — I10 Essential (primary) hypertension: Secondary | ICD-10-CM | POA: Insufficient documentation

## 2018-04-28 DIAGNOSIS — M79605 Pain in left leg: Secondary | ICD-10-CM | POA: Diagnosis present

## 2018-04-28 DIAGNOSIS — Z79899 Other long term (current) drug therapy: Secondary | ICD-10-CM | POA: Diagnosis not present

## 2018-04-28 DIAGNOSIS — G8929 Other chronic pain: Secondary | ICD-10-CM | POA: Diagnosis not present

## 2018-04-28 DIAGNOSIS — F17210 Nicotine dependence, cigarettes, uncomplicated: Secondary | ICD-10-CM | POA: Insufficient documentation

## 2018-04-28 NOTE — ED Provider Notes (Signed)
[facility]-EMERGENCY DEPT Provider Note   CSN: [id number] Arrival date & time: 04/28/18  1459     History   Chief Complaint Chief Complaint  Patient presents with  . detox  . Leg Pain    HPI [name] is a 58 y.o. female.  HPI   57yF with numerous complaints. She is a rambling historian nd difficult to redirect. She is c/o b/l leg pain. This sounds chronic. She says she walks with a cane. Denies new injury or significant change in symptoms recently. She says she get bronchitis frequently. She says maybe she needs alcohol detox. She then says I can go ahead an discharge her. She tells me she has been walking all day. She is requesting something to eat and also wants a soda. She then got around to the idea herself that maybe she just needs to get a check up.   Past Medical History:  Diagnosis Date  . Arthritis   . Bipolar 1 disorder (HCC)   . Hypertension   . Obesity   . Psychiatric illness   . Schizo-affective schizophrenia (HCC)   . Seizures (HCC)   . Substance abuse [facility](HCC)     Patient Active Problem List   Diagnosis Date Noted  . Substance induced mood disorder (HCC) 12/27/2015  . Fever, unspecified 09/13/2015  . Leukocytosis 09/13/2015  . Acute encephalopathy 09/13/2015  . Substance abuse (HCC) 09/12/2015  . Hallucination   . Homicidal ideation   . Schizophrenia, unspecified type (HCC)   . Schizoaffective disorder, bipolar type (HCC)   . Schizoaffective disorder (HCC) 03/29/2013  . Cocaine abuse (HCC) 06/30/2011  . Non-compliant patient 06/29/2011  . Hypertension 06/29/2011  . Alcohol dependence (HCC) 06/28/2011  . Polysubstance dependence (HCC) 04/03/2011  . Psychiatric problem 04/03/2011  . Polysubstance abuse (HCC) 06/21/2010  . Schizophrenia (HCC) 06/21/2010    Past Surgical History:  Procedure Laterality Date  . STOMACH SURGERY       OB History   No obstetric history on file.      Home Medications    Prior to  Admission medications   Medication Sig Start Date End Date Taking? Authorizing Provider  divalproex (DEPAKOTE ER) 500 MG 24 hr tablet Take 1 tablet (500 mg total) by mouth 2 (two) times daily. Patient taking differently: Take 1,000 mg by mouth 2 (two) times daily.  12/28/15   Thermon Leylandavis, Laura A, NP  hydrochlorothiazide (MICROZIDE) 12.5 MG capsule Take 1 capsule (12.5 mg total) by mouth daily. Patient not taking: Reported on 08/17/2017 12/28/15   Thermon Leylandavis, Laura A, NP  lisinopril (PRINIVIL,ZESTRIL) 5 MG tablet Take 1 tablet (5 mg total) by mouth daily. 12/28/15   Thermon Leylandavis, Laura A, NP  paliperidone (INVEGA SUSTENNA) 156 MG/ML SUSP injection Inject 1 mL (156 mg total) into the muscle every 30 (thirty) days. Patient not taking: Reported on 12/03/2016 12/28/15   Thermon Leylandavis, Laura A, NP  paliperidone (INVEGA SUSTENNA) 234 MG/1.5ML SUSP injection Inject 234 mg into the muscle every 30 (thirty) days.    [provider]  paliperidone (INVEGA) 6 MG 24 hr tablet Take 6 mg by mouth daily.    [provider]  potassium chloride (K-DUR,KLOR-CON) 10 MEQ tablet Take 1 tablet (10 mEq total) by mouth daily. 12/28/15   Thermon Leylandavis, Laura A, NP  QUEtiapine (SEROQUEL) 100 MG tablet Take 100 mg by mouth at bedtime.    [provider]  risperiDONE (RISPERDAL) 2 MG tablet Take 1 tablet (2 mg total) by mouth 2 (two) times daily.  12/28/15   Thermon Leyland, NP  topiramate (TOPAMAX) 50 MG tablet Take 50 mg by mouth. 09/16/16   [provider]  traZODone (DESYREL) 50 MG tablet Take 50 mg by mouth. 09/16/16   [provider]  Valbenazine Tosylate (INGREZZA) 40 MG CAPS Take 1 capsule by mouth daily.    [provider]    Family History Family History  Problem Relation Age of Onset  . Alzheimer's disease Mother   . Depression Mother     Social History Social History   Tobacco Use  . Smoking status: Current Every Day Smoker    Packs/day: 1.00    Types: Cigarettes  . Smokeless tobacco:  Never Used  Substance Use Topics  . Alcohol use: Yes    Alcohol/week: 6.0 standard drinks    Types: 6 Cans of beer per week  . Drug use: Yes    Frequency: 5.0 times per week    Types: "Crack" cocaine, Cocaine    Comment: crack/cocaine     Allergies   Patient has no known allergies.   Review of Systems Review of Systems  All systems reviewed and negative, other than as noted in HPI.  Physical Exam Updated Vital Signs BP (!) 217/103 (BP Location: Right Arm)   Pulse (!) 113   Temp 98.7 F (37.1 C) (Oral)   Resp 18   LMP 01/13/2012   SpO2 96%   Physical Exam Vitals signs and nursing note reviewed.  Constitutional:      General: She is not in acute distress.    Appearance: She is well-developed.  HENT:     Head: Normocephalic and atraumatic.  Eyes:     General:        Right eye: No discharge.        Left eye: No discharge.     Conjunctiva/sclera: Conjunctivae normal.  Neck:     Musculoskeletal: Neck supple.  Cardiovascular:     Rate and Rhythm: Regular rhythm. Tachycardia present.     Heart sounds: Normal heart sounds. No murmur. No friction rub. No gallop.   Pulmonary:     Effort: Pulmonary effort is normal. No respiratory distress.     Breath sounds: Normal breath sounds.  Abdominal:     General: There is no distension.     Palpations: Abdomen is soft.     Tenderness: There is no abdominal tenderness.  Musculoskeletal:        General: No tenderness.  Skin:    General: Skin is warm and dry.  Neurological:     Mental Status: She is alert.  Psychiatric:        Behavior: Behavior normal.        Thought Content: Thought content normal.      ED Treatments / Results  Labs (all labs ordered are listed, but only abnormal results are displayed) Labs Reviewed - No data to display  EKG None  Radiology No results found.  Procedures Procedures (including critical care time)  Medications Ordered in ED Medications - No data to display   Initial  Impression / Assessment and Plan / ED Course  I have reviewed the triage vital signs and the nursing notes.  Pertinent labs & imaging results that were available during my care of the patient were reviewed by me and considered in my medical decision making (see chart for details).  57yF with numerous complaints. She is all over the place and most of what she tells me sounds like chronic problems. At this  point, what she primarily wants is a ride to [employer]. She is very hypertensive but I doubt there doesn't appear to be an acute/emergent condition currently.   Final Clinical Impressions(s) / ED Diagnoses   Final diagnoses:  Other chronic pain    ED Discharge Orders    None       Raeford Razor, MD 04/29/18 2249

## 2018-04-28 NOTE — ED Notes (Signed)
   04/28/18 1520  What Happened  Was fall witnessed? Yes  Who witnessed fall? RN  Patients activity before fall other (comment) (going from a standing to sitting position)  Point of contact buttocks  Was patient injured? No  Follow Up  MD notified Koh[place]  Time MD notified [phone]  Additional tests No  Progress note created (see row info) Yes  Adult Fall Risk Assessment  Risk Factor Category (scoring not indicated) Not Applicable  Age 58  Fall History: Fall within 6 months prior to admission 0  Elimination; Bowel and/or Urine Incontinence 0  Elimination; Bowel and/or Urine Urgency/Frequency 0  Medications: includes PCA/Opiates, Anti-convulsants, Anti-hypertensives, Diuretics, Hypnotics, Laxatives, Sedatives, and Psychotropics 0  Patient Care Equipment 0  Mobility-Assistance 0  Mobility-Gait 0  Mobility-Sensory Deficit 0  Altered awareness of immediate physical environment 0  Impulsiveness 0  Lack of understanding of one's physical/cognitive limitations 0  Total Score 0  Patient Fall Risk Level Low fall risk  Adult Fall Risk Interventions  Required Bundle Interventions *See Row Information* Low fall risk - low requirements implemented  Additional Interventions Room near nurses station  Neurological  Neuro (WDL) WDL  Musculoskeletal  Musculoskeletal (WDL) WDL

## 2018-04-28 NOTE — ED Notes (Signed)
[name]  From the ACT team would like to be contacted when the patient is discharged. ([phone])

## 2018-04-28 NOTE — ED Notes (Signed)
Bed: [facility] Expected date:  Expected time:  Means of arrival:  Comments: 

## 2018-04-28 NOTE — ED Notes (Addendum)
Patient demanded food and a phone. When told she had to wait for the doctor to eat she became argumentative with staff. She was told she could use the phone at the nurses station. With that she insisted on using a portable phone. She was informed that we did not have portable phones. She then walked to the nurse's station with verbal aggression making statements such as "I'm going to report ya'll." The patient walked to the nurses station without any need for assistance. She complained that she could not stand so a stool was provided for her comfort. After a stool was provided she fell on her bottom in a sitting position in a "probably" attempt to sit on the stool. No injury resulted. She then screamed, "Ilsa Iha are trying to kill me!" When helped up from the floor she went back to making her phone call. After hanging up from her call she sat back on the hall bed. The tech attempted to take her vitals but she became verbally aggressive and began shouting, "you hit my foot." She accused the tech of hitting her foot with the dinamap, however the dinamap did not come in contact with her foot. The tech addressed this with the patient the the patient continued to be verbally aggressive to the tech, and continued to accuse her of hitting her in the foot. She then walked quickly back to the phone at the nurse's station again saying she was going to call someone to report the hospital. Toward the end of the conversation she handed the phone to the RN to speak with the person on the phone. The RN spoke with a member of the ACT team, Josie Dixon. Nelva Bush asked the RN to call her when the patient was discharged. Several times the patient would get up from her hall bed to walk to use the phone without assistance and stand to make her phone calls.

## 2018-04-28 NOTE — ED Notes (Signed)
Attempted to assess pt vital signs. Observing pt while attempting to sit on bed to assure safety/not trip over shoes on floor in front of bed. Pt began to yell "Oww you hit my toe!" Asked pt what was toe hit with..the pt states "You hit my toe with that thing!" Explained to pt toe was not hit with dinomap d/t already in process of observing gait to assure no tripping over shoes. Pt yelling and stating "Leave me alone I don't want you touching me, I need to call my people/family." Pt came back 1-45min approx stating "Ok you can take my vitals now". Informed pt another RN/NT will assess vitals in near future. Sonya/Susan RN advised. [employer]

## 2018-04-28 NOTE — ED Triage Notes (Addendum)
Pt brought in by EMS for detox. Pt reports she had "a lot" to drink last night and was incontinent of urine, has bilateral leg pain and is hearing voices. Pt arrived and immediately demanded po fluids and phone calls. When pt instructed to wait until she was triaged, she began throwing furniture in triage room 4. Security called. Pt then became cooperative but continued to demand po fluids and telephone calls. Pt also demands a shower.

## 2018-06-09 ENCOUNTER — Other Ambulatory Visit: Payer: Self-pay

## 2018-06-09 ENCOUNTER — Encounter (HOSPITAL_COMMUNITY): Payer: Self-pay

## 2018-06-09 ENCOUNTER — Emergency Department (HOSPITAL_COMMUNITY)
Admission: EM | Admit: 2018-06-09 | Discharge: 2018-06-09 | Disposition: A | Discharge status: Home / Self Care | Payer: Medicare Other | Attending: Emergency Medicine | Admitting: Emergency Medicine

## 2018-06-09 DIAGNOSIS — F17210 Nicotine dependence, cigarettes, uncomplicated: Secondary | ICD-10-CM | POA: Insufficient documentation

## 2018-06-09 DIAGNOSIS — R569 Unspecified convulsions: Secondary | ICD-10-CM | POA: Diagnosis not present

## 2018-06-09 DIAGNOSIS — I10 Essential (primary) hypertension: Secondary | ICD-10-CM | POA: Diagnosis not present

## 2018-06-09 DIAGNOSIS — Z79899 Other long term (current) drug therapy: Secondary | ICD-10-CM | POA: Diagnosis not present

## 2018-06-09 MED ORDER — HYDROCHLOROTHIAZIDE 12.5 MG PO CAPS
12.5000 mg | ORAL_CAPSULE | Freq: Every day | ORAL | Status: DC
  Administered 2018-06-09: 12.5 mg via ORAL
  Filled 2018-06-09: qty 1

## 2018-06-09 MED ORDER — LISINOPRIL 10 MG PO TABS
5.0000 mg | ORAL_TABLET | Freq: Every day | ORAL | Status: DC
  Administered 2018-06-09: 5 mg via ORAL
  Filled 2018-06-09: qty 1

## 2018-06-09 NOTE — Discharge Instructions (Signed)
Please fill the prescription for you blood pressure medications that you received last week at [facility].   Please follow up with your primary care provider within 5-7 days for re-evaluation of your symptoms. If you do not have a primary care provider, information for a healthcare clinic has been provided for you to make arrangements for follow up care.   You were noted to have high blood pressure today during your visit in the emergency department. You will need to follow up with your primary healthcare provider to have your blood pressure rechecked as you may need to be started on medication for this if it remains elevated. If you experience any chest pain, shortness of breath, headaches, vision changes, numbness, weakness, lightheadedness, changes in mental status, or decrease in urination you should return to the emergency department immediately.

## 2018-06-09 NOTE — ED Notes (Signed)
Bed: GD92 Expected date:  Expected time:  Means of arrival:  Comments: Hypertension

## 2018-06-09 NOTE — ED Provider Notes (Signed)
[facility]-EMERGENCY DEPT Provider Note   CSN: [id number] Arrival date & time: 06/09/18  1416    History   Chief Complaint Chief Complaint  Patient presents with  . Hypertension    HPI [name] is a 58 y.o. female.     HPI   Pt is a 58 y/o female with a h/o arthritis, bipolar 1 disorder, hypertension, obesity, schizo-affective schizophrenia, seizures, substance abuse, who presents to the ED today for evaluation of hypertension.  When I enter the room patient is on the phone.  She states she wants to get some food and get back to [employer].  She frequently curses and is verbally aggressive.  I asked why she was in the emergency department and she stated it was because her blood pressure.  States she did not take her medication.  States she has prescriptions for this that she can fill.  She denies headache, dizziness vision changes, chest pain or shortness of breath.  On review of records patient was seen at [facility] for hypertension.  Her labs are reassuring at that time.  She was given refills of her lisinopril and hydrochlorothiazide.  Past Medical History:  Diagnosis Date  . Arthritis   . Bipolar 1 disorder (HCC)   . Hypertension   . Obesity   . Psychiatric illness   . Schizo-affective schizophrenia (HCC)   . Seizures (HCC)   . Substance abuse [facility])     Patient Active Problem List   Diagnosis Date Noted  . Substance induced mood disorder (HCC) 12/27/2015  . Fever, unspecified 09/13/2015  . Leukocytosis 09/13/2015  . Acute encephalopathy 09/13/2015  . Substance abuse (HCC) 09/12/2015  . Hallucination   . Homicidal ideation   . Schizophrenia, unspecified type (HCC)   . Schizoaffective disorder, bipolar type (HCC)   . Schizoaffective disorder (HCC) 03/29/2013  . Cocaine abuse (HCC) 06/30/2011  . Non-compliant patient 06/29/2011  . Hypertension 06/29/2011  . Alcohol dependence (HCC) 06/28/2011  . Polysubstance  dependence (HCC) 04/03/2011  . Psychiatric problem 04/03/2011  . Polysubstance abuse (HCC) 06/21/2010  . Schizophrenia (HCC) 06/21/2010    Past Surgical History:  Procedure Laterality Date  . STOMACH SURGERY       OB History   No obstetric history on file.      Home Medications    Prior to Admission medications   Medication Sig Start Date End Date Taking? Authorizing Provider  divalproex (DEPAKOTE ER) 500 MG 24 hr tablet Take 1 tablet (500 mg total) by mouth 2 (two) times daily. Patient taking differently: Take 1,000 mg by mouth 2 (two) times daily.  12/28/15   Thermon Leyland, NP  hydrochlorothiazide (MICROZIDE) 12.5 MG capsule Take 1 capsule (12.5 mg total) by mouth daily. Patient not taking: Reported on 08/17/2017 12/28/15   Thermon Leyland, NP  lisinopril (PRINIVIL,ZESTRIL) 5 MG tablet Take 1 tablet (5 mg total) by mouth daily. 12/28/15   Thermon Leyland, NP  paliperidone (INVEGA SUSTENNA) 156 MG/ML SUSP injection Inject 1 mL (156 mg total) into the muscle every 30 (thirty) days. Patient not taking: Reported on 12/03/2016 12/28/15   Thermon Leyland, NP  paliperidone (INVEGA SUSTENNA) 234 MG/1.5ML SUSP injection Inject 234 mg into the muscle every 30 (thirty) days.    [provider]  paliperidone (INVEGA) 6 MG 24 hr tablet Take 6 mg by mouth daily.    [provider]  potassium chloride (K-DUR,KLOR-CON) 10 MEQ tablet Take 1 tablet (10 mEq total) by  mouth daily. 12/28/15   Thermon Leylandavis, Laura A, NP  QUEtiapine (SEROQUEL) 100 MG tablet Take 100 mg by mouth at bedtime.    [provider]  risperiDONE (RISPERDAL) 2 MG tablet Take 1 tablet (2 mg total) by mouth 2 (two) times daily. 12/28/15   Thermon Leylandavis, Laura A, NP  topiramate (TOPAMAX) 50 MG tablet Take 50 mg by mouth. 09/16/16   [provider]  traZODone (DESYREL) 50 MG tablet Take 50 mg by mouth. 09/16/16   [provider]  Valbenazine Tosylate (INGREZZA) 40 MG CAPS Take 1 capsule by mouth daily.     [provider]    Family History Family History  Problem Relation Age of Onset  . Alzheimer's disease Mother   . Depression Mother     Social History Social History   Tobacco Use  . Smoking status: Current Every Day Smoker    Packs/day: 1.00    Types: Cigarettes  . Smokeless tobacco: Never Used  Substance Use Topics  . Alcohol use: Yes    Alcohol/week: 6.0 standard drinks    Types: 6 Cans of beer per week  . Drug use: Yes    Frequency: 5.0 times per week    Types: "Crack" cocaine, Cocaine    Comment: crack/cocaine     Allergies   Patient has no known allergies.   Review of Systems Review of Systems  Constitutional: Negative for fever.  HENT: Negative for sore throat.   Eyes: Negative for visual disturbance.  Respiratory: Negative for shortness of breath.   Cardiovascular: Negative for chest pain.  Gastrointestinal: Negative for abdominal pain, nausea and vomiting.  Genitourinary: Negative for decreased urine volume and dysuria.  Musculoskeletal: Negative for back pain.  Skin: Negative for color change and rash.  Neurological: Negative for dizziness, weakness, light-headedness, numbness and headaches.  All other systems reviewed and are negative.    Physical Exam Updated Vital Signs BP (!) 156/92 (BP Location: Right Arm)   Pulse 74   Temp 98.4 F (36.9 C) (Oral)   Resp 20   Ht 5\' 7"  (1.702 m)   Wt 90.7 kg   LMP 01/13/2012   SpO2 98%   BMI 31.32 kg/m   Physical Exam Vitals signs and nursing note reviewed.  Constitutional:      General: She is not in acute distress.    Appearance: She is well-developed.     Comments: On the phone. Wearing n95 mask  HENT:     Head: Normocephalic and atraumatic.  Eyes:     Conjunctiva/sclera: Conjunctivae normal.  Neck:     Musculoskeletal: Neck supple.  Cardiovascular:     Rate and Rhythm: Normal rate and regular rhythm.     Heart sounds: No murmur.  Pulmonary:     Effort: Pulmonary effort is  normal. No respiratory distress.     Breath sounds: Normal breath sounds.  Abdominal:     Palpations: Abdomen is soft.     Tenderness: There is no abdominal tenderness.  Skin:    General: Skin is warm and dry.  Neurological:     Mental Status: She is alert.     Comments: Alert. Answers questions appropriately. No facial droop. Moving all extremities with normal coordination. (exam somewhat limited as she refuses to participate completely)      ED Treatments / Results  Labs (all labs ordered are listed, but only abnormal results are displayed) Labs Reviewed - No data to display  EKG None  Radiology No results found.  Procedures Procedures (  including critical care time)  Medications Ordered in ED Medications  hydrochlorothiazide (MICROZIDE) capsule 12.5 mg (12.5 mg Oral Given 06/09/18 1500)  lisinopril (PRINIVIL,ZESTRIL) tablet 5 mg (5 mg Oral Given 06/09/18 1501)     Initial Impression / Assessment and Plan / ED Course  I have reviewed the triage vital signs and the nursing notes.  Pertinent labs & imaging results that were available during my care of the patient were reviewed by me and considered in my medical decision making (see chart for details).        Final Clinical Impressions(s) / ED Diagnoses   Final diagnoses:  Asymptomatic hypertension   Pt is a 58 y/o female with a h/o arthritis, bipolar 1 disorder, hypertension, obesity, schizo-affective schizophrenia, seizures, substance abuse, who presents to the ED today for evaluation of hypertension. Has not taken medications today. She is asymptomatic. She denies headache, dizziness vision changes, chest pain or shortness of breath.  On review of records patient was seen at [facility] for hypertension.  Her labs are reassuring at that time.  She was given refills of her lisinopril and hydrochlorothiazide.  Her exam today is reassuring and she continues to be asymptomatic. She just received a  refill or her meds last week. She was given dose of her medications in the ED today. Information for pcp given for f/u. Return precautions given.    ED Discharge Orders    None       Rayne Du 06/09/18 1718    Gwyneth Sprout, MD 06/13/18 [phone]

## 2018-06-09 NOTE — ED Triage Notes (Signed)
Pt is non-compliant with HTN meds, BP is reported 210/108 by EMS. Pt also states she has not eaten in 5 days because she has been "drinking and smokin' drugs"

## 2018-10-20 ENCOUNTER — Emergency Department (HOSPITAL_COMMUNITY)
Admission: EM | Admit: 2018-10-20 | Discharge: 2018-10-20 | Disposition: A | Discharge status: Home / Self Care | Payer: Medicare Other | Attending: Emergency Medicine | Admitting: Emergency Medicine

## 2018-10-20 ENCOUNTER — Encounter (HOSPITAL_COMMUNITY): Payer: Self-pay

## 2018-10-20 DIAGNOSIS — I10 Essential (primary) hypertension: Secondary | ICD-10-CM | POA: Insufficient documentation

## 2018-10-20 DIAGNOSIS — Z79899 Other long term (current) drug therapy: Secondary | ICD-10-CM | POA: Diagnosis not present

## 2018-10-20 DIAGNOSIS — F1910 Other psychoactive substance abuse, uncomplicated: Secondary | ICD-10-CM | POA: Diagnosis present

## 2018-10-20 DIAGNOSIS — F17210 Nicotine dependence, cigarettes, uncomplicated: Secondary | ICD-10-CM | POA: Diagnosis not present

## 2018-10-20 NOTE — ED Provider Notes (Signed)
[facility] DEPT Provider Note   CSN: [id number] Arrival date & time: 10/20/18  2542     History   Chief Complaint Chief Complaint  Patient presents with  . Suicidal    HPI [name] is a 58 y.o. female.     HPI Patient states she drank heavily last night and also used cocaine.  She is stating she would like help with rehab this morning.  She has made vague suicidal comments but currently is denying any specific suicidal ideation.  She is asking for a sandwich and a cup of coffee.  Past Medical History:  Diagnosis Date  . Arthritis   . Bipolar 1 disorder ([place])   . Hypertension   . Obesity   . Psychiatric illness   . Schizo-affective schizophrenia ([place])   . Seizures ([place])   . Substance abuse [facility])     Patient Active Problem List   Diagnosis Date Noted  . Substance induced mood disorder ([place]) 12/27/2015  . Fever, unspecified 09/13/2015  . Leukocytosis 09/13/2015  . Acute encephalopathy 09/13/2015  . Substance abuse ([place]) 09/12/2015  . Hallucination   . Homicidal ideation   . Schizophrenia, unspecified type ([place])   . Schizoaffective disorder, bipolar type ([place])   . Schizoaffective disorder ([place]) 03/29/2013  . Cocaine abuse ([place]) 06/30/2011  . Non-compliant patient 06/29/2011  . Hypertension 06/29/2011  . Alcohol dependence ([place]) 06/28/2011  . Polysubstance dependence ([place]) 04/03/2011  . Psychiatric problem 04/03/2011  . Polysubstance abuse ([place]) 06/21/2010  . Schizophrenia ([place]) 06/21/2010    Past Surgical History:  Procedure Laterality Date  . STOMACH SURGERY       OB History   No obstetric history on file.      Home Medications    Prior to Admission medications   Medication Sig Start Date End Date Taking? Authorizing Provider  divalproex (DEPAKOTE ER) 500 MG 24 hr tablet Take 1 tablet (500 mg total) by mouth 2 (two) times daily. Patient taking differently: Take 1,000 mg by mouth 2 (two) times daily.  12/28/15    Niel Hummer, NP  hydrochlorothiazide (MICROZIDE) 12.5 MG capsule Take 1 capsule (12.5 mg total) by mouth daily. Patient not taking: Reported on 08/17/2017 12/28/15   Niel Hummer, NP  lisinopril (PRINIVIL,ZESTRIL) 5 MG tablet Take 1 tablet (5 mg total) by mouth daily. 12/28/15   Niel Hummer, NP  paliperidone (INVEGA SUSTENNA) 156 MG/ML SUSP injection Inject 1 mL (156 mg total) into the muscle every 30 (thirty) days. Patient not taking: Reported on 12/03/2016 12/28/15   Niel Hummer, NP  paliperidone (INVEGA SUSTENNA) 234 MG/1.5ML SUSP injection Inject 234 mg into the muscle every 30 (thirty) days.    [provider]  paliperidone (INVEGA) 6 MG 24 hr tablet Take 6 mg by mouth daily.    [provider]  potassium chloride (K-DUR,KLOR-CON) 10 MEQ tablet Take 1 tablet (10 mEq total) by mouth daily. 12/28/15   Niel Hummer, NP  QUEtiapine (SEROQUEL) 100 MG tablet Take 100 mg by mouth at bedtime.    [provider]  risperiDONE (RISPERDAL) 2 MG tablet Take 1 tablet (2 mg total) by mouth 2 (two) times daily. 12/28/15   Niel Hummer, NP  topiramate (TOPAMAX) 50 MG tablet Take 50 mg by mouth. 09/16/16   [provider]  traZODone (DESYREL) 50 MG tablet Take 50 mg by mouth. 09/16/16   [provider]  Valbenazine Tosylate (INGREZZA) 40 MG CAPS Take 1 capsule by mouth  daily.    [provider]    Family History Family History  Problem Relation Age of Onset  . Alzheimer's disease Mother   . Depression Mother     Social History Social History   Tobacco Use  . Smoking status: Current Every Day Smoker    Packs/day: 1.00    Types: Cigarettes  . Smokeless tobacco: Never Used  Substance Use Topics  . Alcohol use: Yes    Alcohol/week: 6.0 standard drinks    Types: 6 Cans of beer per week  . Drug use: Yes    Frequency: 5.0 times per week    Types: "Crack" cocaine, Cocaine    Comment: crack/cocaine     Allergies   Patient has no known  allergies.   Review of Systems Review of Systems  Constitutional: Negative for chills and fever.  Respiratory: Negative for cough and shortness of breath.   Cardiovascular: Negative for chest pain.  Gastrointestinal: Negative for abdominal pain, nausea and vomiting.  Musculoskeletal: Negative for neck pain.  Skin: Negative for rash and wound.  Neurological: Negative for dizziness, weakness, numbness and headaches.  Psychiatric/Behavioral: Negative for hallucinations, self-injury and suicidal ideas.  All other systems reviewed and are negative.    Physical Exam Updated Vital Signs BP (!) 133/101 (BP Location: Left Arm)   Pulse (!) 103   Temp 98.3 F (36.8 C) (Oral)   Resp 19   LMP 01/13/2012   SpO2 94%   Physical Exam Vitals signs and nursing note reviewed.  Constitutional:      Appearance: Normal appearance. She is well-developed.  HENT:     Head: Normocephalic and atraumatic.     Nose: Nose normal.     Mouth/Throat:     Mouth: Mucous membranes are moist.  Eyes:     Extraocular Movements: Extraocular movements intact.     Pupils: Pupils are equal, round, and reactive to light.  Neck:     Musculoskeletal: Normal range of motion and neck supple. No neck rigidity or muscular tenderness.  Cardiovascular:     Rate and Rhythm: Normal rate and regular rhythm.  Pulmonary:     Effort: Pulmonary effort is normal.     Breath sounds: Normal breath sounds.  Abdominal:     General: Bowel sounds are normal.     Palpations: Abdomen is soft.     Tenderness: There is no abdominal tenderness. There is no guarding or rebound.  Musculoskeletal: Normal range of motion.        General: No tenderness.  Lymphadenopathy:     Cervical: No cervical adenopathy.  Skin:    General: Skin is warm and dry.     Findings: No erythema or rash.  Neurological:     General: No focal deficit present.     Mental Status: She is alert and oriented to person, place, and time.  Psychiatric:         Mood and Affect: Mood normal.        Behavior: Behavior normal.     Comments: Denies current SI/HI.  Does not appear to be responding to internal stimuli.      ED Treatments / Results  Labs (all labs ordered are listed, but only abnormal results are displayed) Labs Reviewed - No data to display  EKG None  Radiology No results found.  Procedures Procedures (including critical care time)  Medications Ordered in ED Medications - No data to display   Initial Impression / Assessment and Plan / ED Course  I  have reviewed the triage vital signs and the nursing notes.  Pertinent labs & imaging results that were available during my care of the patient were reviewed by me and considered in my medical decision making (see chart for details).        We will provide coffee and a sandwich and outpatient resources.  Final Clinical Impressions(s) / ED Diagnoses   Final diagnoses:  Polysubstance abuse [facility](HCC)    ED Discharge Orders    None       Loren RacerYelverton, Kennetta Pavlovic, MD 10/20/18 [phone]

## 2018-10-20 NOTE — ED Notes (Signed)
Pt is agitated, reporting that she is going to get out of here. Offered food and drink to the patient per MD request. Pt stormed out of here, reporting that she is going to call her case worker and that she should have gone to [facility] because they would have treated her pain. Will await pt's return to room from lobby.

## 2018-10-20 NOTE — ED Triage Notes (Signed)
Pt presents via EMS with c/o generalized body pain and suicidal ideation. Pt reports she has been having these thoughts for several years. Pt reports she drank about 20 beers last night. Pt reporting multiple complaints to EMS en route.

## 2018-10-20 NOTE — ED Notes (Signed)
Dr Yelverton at bedside.  

## 2018-11-18 ENCOUNTER — Other Ambulatory Visit: Payer: Self-pay

## 2018-11-18 ENCOUNTER — Encounter (HOSPITAL_COMMUNITY): Payer: Self-pay

## 2018-11-18 ENCOUNTER — Emergency Department (HOSPITAL_COMMUNITY)
Admission: EM | Admit: 2018-11-18 | Discharge: 2018-11-18 | Discharge status: Against Medical Advice | Payer: Medicare Other | Attending: Emergency Medicine | Admitting: Emergency Medicine

## 2018-11-18 DIAGNOSIS — F17210 Nicotine dependence, cigarettes, uncomplicated: Secondary | ICD-10-CM | POA: Diagnosis not present

## 2018-11-18 DIAGNOSIS — Z5329 Procedure and treatment not carried out because of patient's decision for other reasons: Secondary | ICD-10-CM | POA: Diagnosis not present

## 2018-11-18 DIAGNOSIS — Z79899 Other long term (current) drug therapy: Secondary | ICD-10-CM | POA: Insufficient documentation

## 2018-11-18 DIAGNOSIS — F1910 Other psychoactive substance abuse, uncomplicated: Secondary | ICD-10-CM | POA: Diagnosis not present

## 2018-11-18 DIAGNOSIS — R51 Headache: Secondary | ICD-10-CM | POA: Diagnosis present

## 2018-11-18 DIAGNOSIS — I10 Essential (primary) hypertension: Secondary | ICD-10-CM | POA: Diagnosis not present

## 2018-11-18 LAB — I-STAT CHEM 8, ED
BUN: 6 mg/dL (ref 6–20)
Calcium, Ion: 1.19 mmol/L (ref 1.15–1.40)
Chloride: 103 mmol/L (ref 98–111)
Creatinine, Ser: 0.7 mg/dL (ref 0.44–1.00)
Glucose, Bld: 90 mg/dL (ref 70–99)
HCT: 40 % (ref 36.0–46.0)
Hemoglobin: 13.6 g/dL (ref 12.0–15.0)
Potassium: 4.2 mmol/L (ref 3.5–5.1)
Sodium: 142 mmol/L (ref 135–145)
TCO2: 28 mmol/L (ref 22–32)

## 2018-11-18 MED ORDER — HYDROCHLOROTHIAZIDE 12.5 MG PO CAPS
12.5000 mg | ORAL_CAPSULE | Freq: Once | ORAL | Status: AC
  Administered 2018-11-18: 13:00:00 12.5 mg via ORAL
  Filled 2018-11-18: qty 1

## 2018-11-18 MED ORDER — IBUPROFEN 200 MG PO TABS
600.0000 mg | ORAL_TABLET | Freq: Once | ORAL | Status: AC
  Administered 2018-11-18: 600 mg via ORAL
  Filled 2018-11-18: qty 3

## 2018-11-18 NOTE — ED Triage Notes (Signed)
Pt BIBA from [employer] [facility]. Pt c/o HTN. Pt has hx of bipolar. Pt unsure of last time she took any medications. Pt c/o headaches on and off x 1 month.  ETOH last night and this morning. Crack use yesterday.

## 2018-11-18 NOTE — ED Notes (Signed)
Pt called for a ride and has went out front to wait on her ride.

## 2018-11-18 NOTE — ED Provider Notes (Signed)
[facility]-EMERGENCY DEPT Provider Note   CSN: [id number] Arrival date & time: 11/18/18  7622     History   Chief Complaint Chief Complaint  Patient presents with  . Hypertension    HPI [name] is a 58 y.o. female.     Patient with history of bipolar disorder, schizoaffective schizophrenia, polysubstance abuse --presents to the emergency department today with multiple complaints.  Patient comes from the  [employer].  She reports heavy alcohol use and use of crack cocaine.  She states I drank too much last night.  She comes in complaining of her blood pressure being high and reports that she has been off her of her blood pressure medication for some time.  She also reports intermittent headaches.  She denies any chest pain but has pain in her back.  She also is requesting alcohol and crack detox.  She states that she got into an argument with someone that she was with last night and this has stressed her out.  No fevers, URI symptoms, abdominal pain.  Patient states that her doctor thinks that she has a urinary tract infection but she denies being on medications for this.  She states that she has a follow-up appointment on 22 September.     Past Medical History:  Diagnosis Date  . Arthritis   . Bipolar 1 disorder (HCC)   . Hypertension   . Obesity   . Psychiatric illness   . Schizo-affective schizophrenia (HCC)   . Seizures (HCC)   . Substance abuse [facility])     Patient Active Problem List   Diagnosis Date Noted  . Substance induced mood disorder (HCC) 12/27/2015  . Fever, unspecified 09/13/2015  . Leukocytosis 09/13/2015  . Acute encephalopathy 09/13/2015  . Substance abuse (HCC) 09/12/2015  . Hallucination   . Homicidal ideation   . Schizophrenia, unspecified type (HCC)   . Schizoaffective disorder, bipolar type (HCC)   . Schizoaffective disorder (HCC) 03/29/2013  . Cocaine abuse (HCC) 06/30/2011  . Non-compliant patient 06/29/2011  .  Hypertension 06/29/2011  . Alcohol dependence (HCC) 06/28/2011  . Polysubstance dependence (HCC) 04/03/2011  . Psychiatric problem 04/03/2011  . Polysubstance abuse (HCC) 06/21/2010  . Schizophrenia (HCC) 06/21/2010    Past Surgical History:  Procedure Laterality Date  . STOMACH SURGERY       OB History   No obstetric history on file.      Home Medications    Prior to Admission medications   Medication Sig Start Date End Date Taking? Authorizing Provider  divalproex (DEPAKOTE ER) 500 MG 24 hr tablet Take 1 tablet (500 mg total) by mouth 2 (two) times daily. Patient taking differently: Take 1,000 mg by mouth 2 (two) times daily.  12/28/15   Thermon Leyland, NP  hydrochlorothiazide (MICROZIDE) 12.5 MG capsule Take 1 capsule (12.5 mg total) by mouth daily. Patient not taking: Reported on 08/17/2017 12/28/15   Thermon Leyland, NP  lisinopril (PRINIVIL,ZESTRIL) 5 MG tablet Take 1 tablet (5 mg total) by mouth daily. 12/28/15   Thermon Leyland, NP  paliperidone (INVEGA SUSTENNA) 156 MG/ML SUSP injection Inject 1 mL (156 mg total) into the muscle every 30 (thirty) days. Patient not taking: Reported on 12/03/2016 12/28/15   Thermon Leyland, NP  paliperidone (INVEGA SUSTENNA) 234 MG/1.5ML SUSP injection Inject 234 mg into the muscle every 30 (thirty) days.    [provider]  paliperidone (INVEGA) 6 MG 24 hr tablet Take 6 mg by mouth daily.  [provider]  potassium chloride (K-DUR,KLOR-CON) 10 MEQ tablet Take 1 tablet (10 mEq total) by mouth daily. 12/28/15   Thermon Leylandavis, Laura A, NP  QUEtiapine (SEROQUEL) 100 MG tablet Take 100 mg by mouth at bedtime.    [provider]  risperiDONE (RISPERDAL) 2 MG tablet Take 1 tablet (2 mg total) by mouth 2 (two) times daily. 12/28/15   Thermon Leylandavis, Laura A, NP  topiramate (TOPAMAX) 50 MG tablet Take 50 mg by mouth. 09/16/16   [provider]  traZODone (DESYREL) 50 MG tablet Take 50 mg by mouth. 09/16/16   [provider]   Valbenazine Tosylate (INGREZZA) 40 MG CAPS Take 1 capsule by mouth daily.    [provider]    Family History Family History  Problem Relation Age of Onset  . Alzheimer's disease Mother   . Depression Mother     Social History Social History   Tobacco Use  . Smoking status: Current Every Day Smoker    Packs/day: 1.00    Types: Cigarettes  . Smokeless tobacco: Never Used  Substance Use Topics  . Alcohol use: Yes    Alcohol/week: 6.0 standard drinks    Types: 6 Cans of beer per week  . Drug use: Yes    Frequency: 5.0 times per week    Types: "Crack" cocaine, Cocaine    Comment: crack/cocaine     Allergies   Patient has no known allergies.   Review of Systems Review of Systems  Constitutional: Negative for fever.  HENT: Negative for rhinorrhea and sore throat.   Eyes: Negative for redness.  Respiratory: Negative for cough and shortness of breath.   Cardiovascular: Negative for chest pain.  Gastrointestinal: Negative for abdominal pain, diarrhea, nausea and vomiting.  Genitourinary: Negative for dysuria.  Musculoskeletal: Positive for back pain. Negative for myalgias.  Skin: Negative for rash.  Neurological: Positive for headaches.     Physical Exam Updated Vital Signs BP (!) 150/100 (BP Location: Right Arm)   Pulse (!) 105   Temp 99.4 F (37.4 C) (Oral)   Resp 18   Wt 91 kg   LMP 01/13/2012   SpO2 98%   BMI 31.42 kg/m   Physical Exam Vitals signs and nursing note reviewed.  Constitutional:      Appearance: She is well-developed. She is not diaphoretic.  HENT:     Head: Normocephalic and atraumatic.     Mouth/Throat:     Mouth: Mucous membranes are not dry.  Eyes:     Conjunctiva/sclera: Conjunctivae normal.  Neck:     Musculoskeletal: Normal range of motion and neck supple. No muscular tenderness.     Vascular: Normal carotid pulses. No carotid bruit or JVD.     Trachea: Trachea normal. No tracheal deviation.  Cardiovascular:      Rate and Rhythm: Normal rate and regular rhythm.     Pulses: No decreased pulses.     Heart sounds: Normal heart sounds, S1 normal and S2 normal. No murmur.  Pulmonary:     Effort: Pulmonary effort is normal. No respiratory distress.     Breath sounds: No wheezing.  Chest:     Chest wall: No tenderness.  Abdominal:     General: Bowel sounds are normal.     Palpations: Abdomen is soft.     Tenderness: There is no abdominal tenderness. There is no guarding or rebound.  Musculoskeletal: Normal range of motion.  Skin:    General: Skin is warm and dry.  Coloration: Skin is not pale.  Neurological:     General: No focal deficit present.     Mental Status: She is alert.     Comments: Patient with loud, somewhat slurred speech consistent with alcohol intoxication.      ED Treatments / Results  Labs (all labs ordered are listed, but only abnormal results are displayed) Labs Reviewed  I-STAT CHEM 8, ED    EKG EKG Interpretation  Date/Time:  Sunday November 18 2018 11:43:51 EDT Ventricular Rate:  88 PR Interval:    QRS Duration: 88 QT Interval:  355 QTC Calculation: 430 R Axis:   66 Text Interpretation:  Sinus rhythm Consider right atrial enlargement Borderline repolarization abnormality since last tracing no significant change Confirmed by Daleen Bo [phone]) on 11/18/2018 11:53:04 AM   Radiology No results found.  Procedures Procedures (including critical care time)  Medications Ordered in ED Medications  ibuprofen (ADVIL) tablet 600 mg (600 mg Oral Given 11/18/18 1241)  hydrochlorothiazide (MICROZIDE) capsule 12.5 mg (12.5 mg Oral Given 11/18/18 1241)     Initial Impression / Assessment and Plan / ED Course  I have reviewed the triage vital signs and the nursing notes.  Pertinent labs & imaging results that were available during my care of the patient were reviewed by me and considered in my medical decision making (see chart for details).        Patient  seen and examined.  She has a plethora of complaints today.  Her main complaint being a headache.  She reports substance abuse consistent with prior visits.  Will obtain EKG.  Will allow her to sober up a bit.  She does not appear to be in any distress.  Discussed that we do not have alcohol or crack detox here behavioral health and she will need to follow-up with referrals given.  She has been given these in the past and has not followed up.  Vital signs reviewed and are as follows: BP (!) 150/100 (BP Location: Right Arm)   Pulse (!) 105   Temp 99.4 F (37.4 C) (Oral)   Resp 18   Wt 91 kg   LMP 01/13/2012   SpO2 98%   BMI 31.42 kg/m   2:17 PM informed by nurse tech that patient called a ride and discharged her so from the emergency department.  I-STAT Chem-8 was normal.  She did receive a dose of HCTZ here in the emergency department.  Final Clinical Impressions(s) / ED Diagnoses   Final diagnoses:  Hypertension, unspecified type  Polysubstance abuse ([facility])   Patient eloped.  No emergent conditions identified on initial exam or lab work.  EKG unchanged.  ED Discharge Orders    None       Carlisle Cater, PA-C 11/18/18 1417    Daleen Bo, MD 11/20/18 [phone]

## 2018-11-18 NOTE — ED Notes (Signed)
When NT went in to perform EKG on patient, patient began to berate staff for not providing her with food. Staff attempted to explain to patient that she came in to be evaluated for her blood pressure, and that this is a hospital, not a place for people to place food orders.

## 2018-12-16 ENCOUNTER — Emergency Department (HOSPITAL_COMMUNITY)
Admission: EM | Admit: 2018-12-16 | Discharge: 2018-12-16 | Disposition: A | Discharge status: Home / Self Care | Payer: Medicare Other | Attending: Emergency Medicine | Admitting: Emergency Medicine

## 2018-12-16 ENCOUNTER — Encounter (HOSPITAL_COMMUNITY): Payer: Self-pay

## 2018-12-16 DIAGNOSIS — I10 Essential (primary) hypertension: Secondary | ICD-10-CM | POA: Diagnosis not present

## 2018-12-16 DIAGNOSIS — Z79899 Other long term (current) drug therapy: Secondary | ICD-10-CM | POA: Insufficient documentation

## 2018-12-16 DIAGNOSIS — F209 Schizophrenia, unspecified: Secondary | ICD-10-CM | POA: Diagnosis not present

## 2018-12-16 DIAGNOSIS — Z760 Encounter for issue of repeat prescription: Secondary | ICD-10-CM | POA: Diagnosis present

## 2018-12-16 DIAGNOSIS — F17210 Nicotine dependence, cigarettes, uncomplicated: Secondary | ICD-10-CM | POA: Insufficient documentation

## 2018-12-16 MED ORDER — PALIPERIDONE PALMITATE ER 234 MG/1.5ML IM SUSY
234.0000 mg | PREFILLED_SYRINGE | Freq: Once | INTRAMUSCULAR | Status: AC
  Administered 2018-12-16: 234 mg via INTRAMUSCULAR
  Filled 2018-12-16: qty 1.5

## 2018-12-16 MED ORDER — HYDROCHLOROTHIAZIDE 12.5 MG PO CAPS
12.5000 mg | ORAL_CAPSULE | Freq: Once | ORAL | Status: AC
  Administered 2018-12-16: 12.5 mg via ORAL
  Filled 2018-12-16: qty 1

## 2018-12-16 MED ORDER — HYDROCHLOROTHIAZIDE 12.5 MG PO CAPS: 12.5000 mg | ORAL_CAPSULE | Freq: Every day | ORAL | 0 refills | Status: DC

## 2018-12-16 NOTE — Discharge Instructions (Addendum)
Take hydrochlorothiazide daily.  Follow-up with your care team within 5 days for continued evaluation.  Stop doing crack.  Return to the ER immediately for new or worsening symptoms or concerns, such as chest pain, shortness of breath, fevers, vomiting or any concerns at all.

## 2018-12-16 NOTE — ED Triage Notes (Addendum)
Pt BIBA from home. Pt used crack cocaine and ETOH today. Pt is out of BP meds and is c/o HTN.  Pt also states she needs an injection for her BPD.
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
## 2019-02-03 ENCOUNTER — Other Ambulatory Visit: Payer: Self-pay

## 2019-02-03 ENCOUNTER — Emergency Department (HOSPITAL_COMMUNITY)
Admission: EM | Admit: 2019-02-03 | Discharge: 2019-02-03 | Disposition: A | Discharge status: Home / Self Care | Payer: Medicare Other | Attending: Emergency Medicine | Admitting: Emergency Medicine

## 2019-02-03 ENCOUNTER — Encounter (HOSPITAL_COMMUNITY): Payer: Self-pay | Admitting: Emergency Medicine

## 2019-02-03 DIAGNOSIS — G8929 Other chronic pain: Secondary | ICD-10-CM | POA: Insufficient documentation

## 2019-02-03 DIAGNOSIS — Z79899 Other long term (current) drug therapy: Secondary | ICD-10-CM | POA: Insufficient documentation

## 2019-02-03 DIAGNOSIS — F17210 Nicotine dependence, cigarettes, uncomplicated: Secondary | ICD-10-CM | POA: Diagnosis not present

## 2019-02-03 DIAGNOSIS — F309 Manic episode, unspecified: Secondary | ICD-10-CM | POA: Diagnosis present

## 2019-02-03 DIAGNOSIS — I10 Essential (primary) hypertension: Secondary | ICD-10-CM | POA: Insufficient documentation

## 2019-02-03 NOTE — Discharge Instructions (Addendum)
Return to the emergency department for any new or worsening symptoms.  It is very important that you take your medications as prescribed.  Your blood pressure was elevated today.  Please follow-up with your primary care doctor to have this rechecked within 1 week.

## 2019-02-03 NOTE — ED Notes (Signed)
Patient wouldn't allow this writer to take sweater off to obtain accurate blood pressure.
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
## 2019-03-03 ENCOUNTER — Emergency Department (HOSPITAL_COMMUNITY)
Admission: EM | Admit: 2019-03-03 | Discharge: 2019-03-03 | Disposition: A | Discharge status: Home / Self Care | Payer: Medicare Other | Attending: Emergency Medicine | Admitting: Emergency Medicine

## 2019-03-03 ENCOUNTER — Other Ambulatory Visit: Payer: Self-pay

## 2019-03-03 ENCOUNTER — Encounter (HOSPITAL_COMMUNITY): Payer: Self-pay

## 2019-03-03 DIAGNOSIS — F1410 Cocaine abuse, uncomplicated: Secondary | ICD-10-CM | POA: Insufficient documentation

## 2019-03-03 DIAGNOSIS — R531 Weakness: Secondary | ICD-10-CM | POA: Diagnosis present

## 2019-03-03 DIAGNOSIS — Z79899 Other long term (current) drug therapy: Secondary | ICD-10-CM | POA: Insufficient documentation

## 2019-03-03 DIAGNOSIS — I10 Essential (primary) hypertension: Secondary | ICD-10-CM | POA: Insufficient documentation

## 2019-03-03 DIAGNOSIS — R300 Dysuria: Secondary | ICD-10-CM | POA: Insufficient documentation

## 2019-03-03 DIAGNOSIS — F17210 Nicotine dependence, cigarettes, uncomplicated: Secondary | ICD-10-CM | POA: Diagnosis not present

## 2019-03-03 DIAGNOSIS — R109 Unspecified abdominal pain: Secondary | ICD-10-CM | POA: Insufficient documentation

## 2019-03-03 LAB — URINALYSIS, ROUTINE W REFLEX MICROSCOPIC
Bilirubin Urine: NEGATIVE
Glucose, UA: NEGATIVE mg/dL
Hgb urine dipstick: NEGATIVE
Ketones, ur: NEGATIVE mg/dL
Leukocytes,Ua: NEGATIVE
Nitrite: NEGATIVE
Protein, ur: NEGATIVE mg/dL
Specific Gravity, Urine: 1.024 (ref 1.005–1.030)
pH: 5 (ref 5.0–8.0)
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
## 2019-03-03 NOTE — ED Notes (Signed)
Patient requested and was given a sandwich, peanut butter, crackers, chicken noodle soup, and ginger ale.

## 2019-03-03 NOTE — Discharge Instructions (Addendum)
Return to the ED if you start to have worsening pain, injuries or falls, chest pain or shortness of breath.

## 2019-03-03 NOTE — ED Triage Notes (Signed)
Pt BIB EMS from hotel. Pt has hx of schizophrenia and admitted to using crack cocaine and alcohol 2 days ago. Pt reports new onset generalized weakness and abdominal pain related to surgery a few years ago.   HR 120  96% RA CBG 177 Temp 97.3

## 2019-03-03 NOTE — ED Provider Notes (Signed)
[facility]-EMERGENCY DEPT Provider Note   CSN: [id number] Arrival date & time: 03/03/19  1215     History Chief Complaint  Patient presents with  . Medical Clearance  . Abdominal Pain    [name] is a 58 y.o. female with a past medical history of substance abuse, schizophrenia, hypertension, bipolar disorder presenting to the ED with a chief complaint of kidney infection. Patient is a difficult historian, stating that she is hurting all over, wants to check if she has a kidney infection, knot on her head, among other complaints that are difficulty to understand. States she wants food, to get a ride back to her motel. Patient also telling me about last drug and alcohol use 2 days ago. Remainder of history is limited.  HPI     Past Medical History:  Diagnosis Date  . Arthritis   . Bipolar 1 disorder (HCC)   . Hypertension   . Obesity   . Psychiatric illness   . Schizo-affective schizophrenia (HCC)   . Seizures (HCC)   . Substance abuse [facility](HCC)     Patient Active Problem List   Diagnosis Date Noted  . Substance induced mood disorder (HCC) 12/27/2015  . Fever, unspecified 09/13/2015  . Leukocytosis 09/13/2015  . Acute encephalopathy 09/13/2015  . Substance abuse (HCC) 09/12/2015  . Hallucination   . Homicidal ideation   . Schizophrenia, unspecified type (HCC)   . Schizoaffective disorder, bipolar type (HCC)   . Schizoaffective disorder (HCC) 03/29/2013  . Cocaine abuse (HCC) 06/30/2011  . Non-compliant patient 06/29/2011  . Hypertension 06/29/2011  . Alcohol dependence (HCC) 06/28/2011  . Polysubstance dependence (HCC) 04/03/2011  . Psychiatric problem 04/03/2011  . Polysubstance abuse (HCC) 06/21/2010  . Schizophrenia (HCC) 06/21/2010    Past Surgical History:  Procedure Laterality Date  . STOMACH SURGERY       OB History   No obstetric history on file.     Family History  Problem Relation Age of Onset  . Alzheimer's disease  Mother   . Depression Mother     Social History   Tobacco Use  . Smoking status: Current Every Day Smoker    Packs/day: 1.00    Types: Cigarettes  . Smokeless tobacco: Never Used  Substance Use Topics  . Alcohol use: Yes    Alcohol/week: 6.0 standard drinks    Types: 6 Cans of beer per week  . Drug use: Yes    Frequency: 5.0 times per week    Types: "Crack" cocaine, Cocaine    Comment: crack/cocaine    Home Medications Prior to Admission medications   Medication Sig Start Date End Date Taking? Authorizing Provider  paliperidone (INVEGA SUSTENNA) 234 MG/1.5ML SUSP injection Inject 234 mg into the muscle every 30 (thirty) days.   Yes [provider]  divalproex (DEPAKOTE ER) 500 MG 24 hr tablet Take 1 tablet (500 mg total) by mouth 2 (two) times daily. Patient not taking: Reported on 03/03/2019 12/28/15   Thermon Leylandavis, Laura A, NP  hydrochlorothiazide (MICROZIDE) 12.5 MG capsule Take 1 capsule (12.5 mg total) by mouth daily. Patient not taking: Reported on 03/03/2019 12/16/18   Kendrick, Caitlyn S, PA-C  lisinopril (PRINIVIL,ZESTRIL) 5 MG tablet Take 1 tablet (5 mg total) by mouth daily. Patient not taking: Reported on 03/03/2019 12/28/15   Thermon Leylandavis, Laura A, NP  paliperidone (INVEGA SUSTENNA) 156 MG/ML SUSP injection Inject 1 mL (156 mg total) into the muscle every 30 (thirty) days. Patient not taking: Reported on 12/03/2016 12/28/15  Niel Hummer, NP  paliperidone (INVEGA) 6 MG 24 hr tablet Take 6 mg by mouth daily.    [provider]  potassium chloride (K-DUR,KLOR-CON) 10 MEQ tablet Take 1 tablet (10 mEq total) by mouth daily. Patient not taking: Reported on 03/03/2019 12/28/15   Niel Hummer, NP  QUEtiapine (SEROQUEL) 100 MG tablet Take 100 mg by mouth at bedtime.    [provider]  risperiDONE (RISPERDAL) 2 MG tablet Take 1 tablet (2 mg total) by mouth 2 (two) times daily. Patient not taking: Reported on 03/03/2019 12/28/15   Niel Hummer, NP    topiramate (TOPAMAX) 50 MG tablet Take 50 mg by mouth. 09/16/16   [provider]  traZODone (DESYREL) 50 MG tablet Take 50 mg by mouth. 09/16/16   [provider]  Valbenazine Tosylate (INGREZZA) 40 MG CAPS Take 1 capsule by mouth daily.    [provider]    Allergies    Patient has no known allergies.  Review of Systems   Review of Systems  Unable to perform ROS: Psychiatric disorder    Physical Exam Updated Vital Signs BP (!) 180/87 (BP Location: Right Arm)   Pulse 93   Temp 99.4 F (37.4 C) (Oral)   Resp 18   LMP 01/13/2012   SpO2 97%   Physical Exam Vitals and nursing note reviewed.  Constitutional:      General: She is not in acute distress.    Appearance: She is well-developed. She is not diaphoretic.  HENT:     Head: Normocephalic and atraumatic.     Nose: Nose normal.  Eyes:     General: No scleral icterus.       Left eye: No discharge.     Conjunctiva/sclera: Conjunctivae normal.  Cardiovascular:     Rate and Rhythm: Regular rhythm. Tachycardia present.     Heart sounds: Normal heart sounds. No murmur. No friction rub. No gallop.   Pulmonary:     Effort: Pulmonary effort is normal. No respiratory distress.     Breath sounds: Normal breath sounds.  Abdominal:     General: Bowel sounds are normal. There is no distension.     Palpations: Abdomen is soft.     Tenderness: There is no abdominal tenderness. There is no guarding.  Musculoskeletal:        General: Normal range of motion.     Cervical back: Normal range of motion and neck supple.  Skin:    General: Skin is warm and dry.     Findings: No rash.  Neurological:     Mental Status: She is alert.     Motor: No abnormal muscle tone.     Coordination: Coordination normal.     ED Results / Procedures / Treatments   Labs (all labs ordered are listed, but only abnormal results are displayed) Labs Reviewed  URINALYSIS, ROUTINE W REFLEX MICROSCOPIC     EKG None  Radiology No results found.  Procedures Procedures (including critical care time)  Medications Ordered in ED Medications - No data to display  ED Course  I have reviewed the triage vital signs and the nursing notes.  Pertinent labs & imaging results that were available during my care of the patient were reviewed by me and considered in my medical decision making (see chart for details).    MDM Rules/Calculators/A&P                      58 year old female with  a past medical history of substance abuse, schizophrenia, hypertension presenting to the ED with a chief complaint of kidney infection.  She is a difficult historian, stating that she is hurting all over.  She denies any focal abdominal pain or chest pain.  Urinalysis here is unremarkable.  Patient has been resting comfortably.  Vital signs are within normal limits.  She is requesting a cab voucher and a meal.  She was given food and drink. Do not feel that further workup is warranted at this time.  Patient is hemodynamically stable, in NAD. Evaluation does not show pathology that would require ongoing emergent intervention or inpatient treatment. I explained the diagnosis to the patient. Pain has been managed and has no complaints prior to discharge. Patient is comfortable with above plan and is stable for discharge at this time. All questions were answered prior to disposition. Strict return precautions for returning to the ED were discussed. Encouraged follow up with PCP.   An After Visit Summary was printed and given to the patient.   Portions of this note were generated with Scientist, clinical (histocompatibility and immunogenetics). Dictation errors may occur despite best attempts at proofreading.  Final Clinical Impression(s) / ED Diagnoses Final diagnoses:  Dysuria    Rx / DC Orders ED Discharge Orders    None       Dietrich Pates, PA-C 03/03/19 1513    Arby Barrette, MD 03/03/19 [phone]

## 2019-04-29 ENCOUNTER — Encounter: Payer: Self-pay | Admitting: Family Medicine

## 2019-04-29 ENCOUNTER — Encounter: Payer: Medicare Other | Admitting: Family Medicine

## 2019-04-30 ENCOUNTER — Emergency Department (HOSPITAL_COMMUNITY)
Admission: EM | Admit: 2019-04-30 | Discharge: 2019-04-30 | Disposition: A | Discharge status: Home / Self Care | Payer: Medicare Other | Attending: Emergency Medicine | Admitting: Emergency Medicine

## 2019-04-30 ENCOUNTER — Other Ambulatory Visit: Payer: Self-pay

## 2019-04-30 ENCOUNTER — Encounter (HOSPITAL_COMMUNITY): Payer: Self-pay | Admitting: Emergency Medicine

## 2019-04-30 DIAGNOSIS — F209 Schizophrenia, unspecified: Secondary | ICD-10-CM | POA: Insufficient documentation

## 2019-04-30 DIAGNOSIS — Z79899 Other long term (current) drug therapy: Secondary | ICD-10-CM | POA: Diagnosis not present

## 2019-04-30 DIAGNOSIS — F1910 Other psychoactive substance abuse, uncomplicated: Secondary | ICD-10-CM | POA: Diagnosis not present

## 2019-04-30 DIAGNOSIS — F1410 Cocaine abuse, uncomplicated: Secondary | ICD-10-CM | POA: Insufficient documentation

## 2019-04-30 DIAGNOSIS — M549 Dorsalgia, unspecified: Secondary | ICD-10-CM | POA: Diagnosis present

## 2019-04-30 DIAGNOSIS — I10 Essential (primary) hypertension: Secondary | ICD-10-CM | POA: Insufficient documentation

## 2019-04-30 DIAGNOSIS — F17210 Nicotine dependence, cigarettes, uncomplicated: Secondary | ICD-10-CM | POA: Insufficient documentation

## 2019-04-30 DIAGNOSIS — F1010 Alcohol abuse, uncomplicated: Secondary | ICD-10-CM | POA: Diagnosis not present

## 2019-04-30 DIAGNOSIS — F319 Bipolar disorder, unspecified: Secondary | ICD-10-CM | POA: Diagnosis not present

## 2019-04-30 MED ORDER — LISINOPRIL 10 MG PO TABS
10.0000 mg | ORAL_TABLET | Freq: Once | ORAL | Status: AC
  Administered 2019-04-30: 10 mg via ORAL
  Filled 2019-04-30: qty 1

## 2019-04-30 MED ORDER — HYDROCHLOROTHIAZIDE 12.5 MG PO CAPS
12.5000 mg | ORAL_CAPSULE | Freq: Once | ORAL | Status: AC
  Administered 2019-04-30: 08:00:00 12.5 mg via ORAL
  Filled 2019-04-30: qty 1

## 2019-04-30 MED ORDER — SODIUM CHLORIDE 0.9 % IV BOLUS (SEPSIS): 500.0000 mL | Freq: Once | INTRAVENOUS | Status: DC

## 2019-04-30 NOTE — ED Notes (Signed)
When bringing pt food as requested, pt yelled and stated she did not want cold food or water. She stated "I want to go home and eat my food".

## 2019-04-30 NOTE — Discharge Instructions (Signed)
Thank you for allowing me to care for you today. Please return to the emergency department if you have new or worsening symptoms. Take your medications as instructed.  ° °

## 2019-04-30 NOTE — ED Provider Notes (Signed)
[facility]-EMERGENCY DEPT Provider Note   CSN: [id number] Arrival date & time: 04/30/19  0636     History Chief Complaint  Patient presents with  . Back Pain    [name] is a 59 y.o. female.  Patient is a 59 year old female with past medical history of bipolar disorder, hypertension, schizophrenia, substance abuse presenting to the emergency department via EMS for complaints of drinking too much alcohol and increased blood pressure.  Patient reports that last night she had a lot of alcohol as well as did cocaine.  Reports that she was walking out in the streets and was not sure where she was until she found a friend's house where she went inside and called 911 to get here.  Patient reports that when she drinks alcohol her back also hurts.  She is concerned that when she drinks alcohol and does drugs her blood also increases.  Denies any injuries or fall.  Denies any other symptoms.  Reports that she needs to leave soon because her stimulus check came in and she needs to go and get it.  Also reports she needs to go to another doctor's appointment today.  Patient reports that she is hungry and is asking for food and drink.  Patient reports she has not taken her blood pressure medication in a couple of days.  She is requesting transportation back home.        Past Medical History:  Diagnosis Date  . Arthritis   . Bipolar 1 disorder (HCC)   . Hypertension   . Obesity   . Psychiatric illness   . Schizo-affective schizophrenia (HCC)   . Seizures (HCC)   . Substance abuse [facility])     Patient Active Problem List   Diagnosis Date Noted  . Substance induced mood disorder (HCC) 12/27/2015  . Fever, unspecified 09/13/2015  . Leukocytosis 09/13/2015  . Acute encephalopathy 09/13/2015  . Substance abuse (HCC) 09/12/2015  . Hallucination   . Homicidal ideation   . Schizophrenia, unspecified type (HCC)   . Schizoaffective disorder, bipolar type (HCC)   .  Schizoaffective disorder (HCC) 03/29/2013  . Cocaine abuse (HCC) 06/30/2011  . Non-compliant patient 06/29/2011  . Hypertension 06/29/2011  . Alcohol dependence (HCC) 06/28/2011  . Polysubstance dependence (HCC) 04/03/2011  . Psychiatric problem 04/03/2011  . Polysubstance abuse (HCC) 06/21/2010  . Schizophrenia (HCC) 06/21/2010    Past Surgical History:  Procedure Laterality Date  . STOMACH SURGERY       OB History   No obstetric history on file.     Family History  Problem Relation Age of Onset  . Alzheimer's disease Mother   . Depression Mother     Social History   Tobacco Use  . Smoking status: Current Every Day Smoker    Packs/day: 1.00    Types: Cigarettes  . Smokeless tobacco: Never Used  Substance Use Topics  . Alcohol use: Yes    Alcohol/week: 6.0 standard drinks    Types: 6 Cans of beer per week  . Drug use: Yes    Frequency: 5.0 times per week    Types: "Crack" cocaine, Cocaine    Comment: crack/cocaine    Home Medications Prior to Admission medications   Medication Sig Start Date End Date Taking? Authorizing Provider  divalproex (DEPAKOTE ER) 500 MG 24 hr tablet Take 1 tablet (500 mg total) by mouth 2 (two) times daily. Patient not taking: Reported on 03/03/2019 12/28/15   Thermon Leyland, NP  hydrochlorothiazide (MICROZIDE) 12.5  MG capsule Take 1 capsule (12.5 mg total) by mouth daily. Patient not taking: Reported on 03/03/2019 12/16/18   Kendrick, Caitlyn S, PA-C  lisinopril (PRINIVIL,ZESTRIL) 5 MG tablet Take 1 tablet (5 mg total) by mouth daily. Patient not taking: Reported on 03/03/2019 12/28/15   Thermon Leyland, NP  paliperidone (INVEGA SUSTENNA) 156 MG/ML SUSP injection Inject 1 mL (156 mg total) into the muscle every 30 (thirty) days. Patient not taking: Reported on 12/03/2016 12/28/15   Thermon Leyland, NP  paliperidone (INVEGA SUSTENNA) 234 MG/1.5ML SUSP injection Inject 234 mg into the muscle every 30 (thirty) days.    [provider]   paliperidone (INVEGA) 6 MG 24 hr tablet Take 6 mg by mouth daily.    [provider]  potassium chloride (K-DUR,KLOR-CON) 10 MEQ tablet Take 1 tablet (10 mEq total) by mouth daily. Patient not taking: Reported on 03/03/2019 12/28/15   Thermon Leyland, NP  QUEtiapine (SEROQUEL) 100 MG tablet Take 100 mg by mouth at bedtime.    [provider]  risperiDONE (RISPERDAL) 2 MG tablet Take 1 tablet (2 mg total) by mouth 2 (two) times daily. Patient not taking: Reported on 03/03/2019 12/28/15   Thermon Leyland, NP  topiramate (TOPAMAX) 50 MG tablet Take 50 mg by mouth. 09/16/16   [provider]  traZODone (DESYREL) 50 MG tablet Take 50 mg by mouth. 09/16/16   [provider]  Valbenazine Tosylate (INGREZZA) 40 MG CAPS Take 1 capsule by mouth daily.    [provider]    Allergies    Patient has no known allergies.  Review of Systems   Review of Systems  Constitutional: Negative for appetite change, chills and fever.  HENT: Negative for congestion and sore throat.   Respiratory: Negative for cough and shortness of breath.   Cardiovascular: Negative for chest pain, palpitations and leg swelling.  Gastrointestinal: Negative for abdominal pain, nausea and vomiting.  Musculoskeletal: Positive for back pain. Negative for arthralgias.  Skin: Negative for rash and wound.    Physical Exam Updated Vital Signs BP (!) 152/101 (BP Location: Right Arm)   Pulse (!) 111   Temp 98.9 F (37.2 C) (Oral)   Resp 20   Ht 5\' 7"  (1.702 m)   Wt 136.1 kg   LMP 01/13/2012   SpO2 100%   BMI 46.99 kg/m   Physical Exam Vitals and nursing note reviewed.  Constitutional:      General: She is not in acute distress.    Appearance: Normal appearance. She is obese. She is not ill-appearing, toxic-appearing or diaphoretic.  HENT:     Head: Normocephalic.     Nose: Nose normal.     Mouth/Throat:     Mouth: Mucous membranes are moist.  Eyes:     Conjunctiva/sclera:  Conjunctivae normal.  Cardiovascular:     Rate and Rhythm: Regular rhythm. Tachycardia present.  Pulmonary:     Effort: Pulmonary effort is normal.     Breath sounds: Normal breath sounds. No wheezing or rhonchi.  Chest:     Chest wall: No tenderness.  Abdominal:     General: Abdomen is flat. Bowel sounds are normal.  Musculoskeletal:        General: No swelling or tenderness.     Right lower leg: No edema.     Left lower leg: No edema.  Skin:    General: Skin is warm and dry.  Neurological:     General: No focal deficit present.  Mental Status: She is alert.  Psychiatric:        Mood and Affect: Mood normal.     ED Results / Procedures / Treatments   Labs (all labs ordered are listed, but only abnormal results are displayed) Labs Reviewed  URINALYSIS, ROUTINE W REFLEX MICROSCOPIC  RAPID URINE DRUG SCREEN, HOSP PERFORMED  CBC  BASIC METABOLIC PANEL    EKG None  Radiology No results found.  Procedures Procedures (including critical care time)  Medications Ordered in ED Medications  sodium chloride 0.9 % bolus 500 mL (has no administration in time range)  hydrochlorothiazide (MICROZIDE) capsule 12.5 mg (12.5 mg Oral Given 04/30/19 0753)  lisinopril (ZESTRIL) tablet 10 mg (10 mg Oral Given 04/30/19 0753)    ED Course  I have reviewed the triage vital signs and the nursing notes.  Pertinent labs & imaging results that were available during my care of the patient were reviewed by me and considered in my medical decision making (see chart for details).  Clinical Course as of Apr 29 806  Tue Apr 30, 2019  0734 Patient w/ substance abuse and psych history presenting after she called 911 because she was intoxicated.  Patient is here and demanding a hot meal and ginger ale.  She is also demanding a ride home so that she can get her stimulus check.  She tells me "I don't need your help". Patient appears well and is in no acute distress.  She was mildly tachycardic with  hypertension of 156/123 on arrival.  This is likely secondary to alcohol and cocaine use and medication noncompliance. She denies SI, HI.    [KM]  [phone] Patient continued to demand a "hot meal" despite being offered gingerale and [place] sandwhich. When I entered there room to reevaluate she was out of the bed and putting her shoes back on. She declined further workup with labs and EKG. She was given a dose of her home medication and called a ride home to come and pick her up to "get my stimulus check". Patient appears stable, NAD.    [KM]    Clinical Course User Index [KM] Kristine Royal   MDM Rules/Calculators/A&P                      Based on review of vitals, medical screening exam, lab work and/or imaging, there does not appear to be an acute, emergent etiology for the patient's symptoms. Counseled pt on good return precautions and encouraged both PCP and ED follow-up as needed.  Prior to discharge, I also discussed incidental imaging findings with patient in detail and advised appropriate, recommended follow-up in detail.  Clinical Impression: 1. Polysubstance abuse ([place])   2. Hypertension, unspecified type     Disposition: Discharge  Prior to providing a prescription for a controlled substance, I independently reviewed the patient's recent prescription history on the [place]. The patient had no recent or regular prescriptions and was deemed appropriate for a brief, less than 3 day prescription of narcotic for acute analgesia.  This note was prepared with assistance of Systems analyst. Occasional wrong-word or sound-a-like substitutions may have occurred due to the inherent limitations of voice recognition software.  Final Clinical Impression(s) / ED Diagnoses Final diagnoses:  Polysubstance abuse ([place])  Hypertension, unspecified type    Rx / DC Orders ED Discharge Orders    None       Kristine Royal 04/30/19 9509  Donnetta Hutching, MD 04/30/19 [phone]

## 2019-04-30 NOTE — ED Notes (Signed)
Pt stated she already called her ride since she received her blood pressure medication. Pt stated she wanted her papers so she could go sit in the lobby and wait for her ride.

## 2019-04-30 NOTE — ED Triage Notes (Signed)
PT arrived via EMS complaining of lower back pain. Per EMS the pain began about a month ago, after she moved into an apartment and had to carry in her belongings. Pt has been using bengay at home to help with the pain, but reports that she is still in pain. Pt reports that she relapsed last night on alcohol and drugs. She reports that she had wine and gin, and a very small amount of cocaine.

## 2019-06-21 ENCOUNTER — Emergency Department (HOSPITAL_COMMUNITY)
Admission: EM | Admit: 2019-06-21 | Discharge: 2019-06-21 | Disposition: A | Discharge status: Home / Self Care | Payer: Medicare Other | Attending: Emergency Medicine | Admitting: Emergency Medicine

## 2019-06-21 ENCOUNTER — Other Ambulatory Visit: Payer: Self-pay

## 2019-06-21 ENCOUNTER — Encounter (HOSPITAL_COMMUNITY): Payer: Self-pay

## 2019-06-21 DIAGNOSIS — F1910 Other psychoactive substance abuse, uncomplicated: Secondary | ICD-10-CM | POA: Diagnosis not present

## 2019-06-21 DIAGNOSIS — M545 Low back pain, unspecified: Secondary | ICD-10-CM

## 2019-06-21 DIAGNOSIS — F17210 Nicotine dependence, cigarettes, uncomplicated: Secondary | ICD-10-CM | POA: Diagnosis not present

## 2019-06-21 DIAGNOSIS — R350 Frequency of micturition: Secondary | ICD-10-CM | POA: Diagnosis not present

## 2019-06-21 DIAGNOSIS — Z9114 Patient's other noncompliance with medication regimen: Secondary | ICD-10-CM | POA: Insufficient documentation

## 2019-06-21 DIAGNOSIS — R3915 Urgency of urination: Secondary | ICD-10-CM | POA: Diagnosis not present

## 2019-06-21 DIAGNOSIS — Z79899 Other long term (current) drug therapy: Secondary | ICD-10-CM | POA: Diagnosis not present

## 2019-06-21 DIAGNOSIS — I10 Essential (primary) hypertension: Secondary | ICD-10-CM | POA: Insufficient documentation

## 2019-06-21 DIAGNOSIS — R300 Dysuria: Secondary | ICD-10-CM | POA: Diagnosis not present

## 2019-06-21 DIAGNOSIS — N3000 Acute cystitis without hematuria: Secondary | ICD-10-CM | POA: Diagnosis not present

## 2019-06-21 DIAGNOSIS — F1410 Cocaine abuse, uncomplicated: Secondary | ICD-10-CM | POA: Insufficient documentation

## 2019-06-21 LAB — URINALYSIS, ROUTINE W REFLEX MICROSCOPIC
Bilirubin Urine: NEGATIVE
Glucose, UA: NEGATIVE mg/dL
Ketones, ur: NEGATIVE mg/dL
Nitrite: NEGATIVE
Protein, ur: NEGATIVE mg/dL
Specific Gravity, Urine: 1.011 (ref 1.005–1.030)
pH: 5 (ref 5.0–8.0)

## 2019-06-21 MED ORDER — HYDROCHLOROTHIAZIDE 12.5 MG PO CAPS
25.0000 mg | ORAL_CAPSULE | Freq: Once | ORAL | Status: AC
  Administered 2019-06-21: 25 mg via ORAL
  Filled 2019-06-21: qty 2

## 2019-06-21 MED ORDER — CEPHALEXIN 500 MG PO CAPS
500.0000 mg | ORAL_CAPSULE | Freq: Once | ORAL | Status: AC
  Administered 2019-06-21: 500 mg via ORAL
  Filled 2019-06-21: qty 1

## 2019-06-21 MED ORDER — CEPHALEXIN 500 MG PO CAPS: ORAL_CAPSULE | ORAL | 0 refills | Status: DC

## 2019-06-21 NOTE — ED Triage Notes (Signed)
Pt sts "hasn't been to bed in 3 days. Hopes her house is still home when she gets there. Been out partying with the wild people." Sts crack use. C/o back, leg pain. Asked [facility] for a ride here

## 2019-06-21 NOTE — ED Provider Notes (Signed)
[facility]-EMERGENCY DEPT Provider Note   CSN: [id number] Arrival date & time: 06/21/19  0146     History Chief Complaint  Patient presents with  . Back Pain    [name] is a 59 y.o. female with a hx of arthritis, hypertension, substance abuse, schizophrenia presents to the Emergency Department complaining of gradual, persistent, progressively worsening low back pain onset several days ago.  She reports she has additionally had urinary urgency and urinary frequency with some dysuria.  She denies hematuria.  Patient reports she has been hanging out with her wild friends and doing a lot of walking which has caused some pain in her low back and legs.  She reports she asked a friend to take her home last night but they could not find her house therefore she came here to the emergency department.  She denies fever, chills, headache, neck pain, chest pain, shortness of breath, abdominal pain, nausea, vomiting, diarrhea.  Patient reports she has not been taking her home medications as prescribed including her blood pressure medications.  Additionally she reports alcohol usage and crack cocaine last night.  Nothing seems to make her symptoms better or worse.   The history is provided by the patient and medical records. No language interpreter was used.       Past Medical History:  Diagnosis Date  . Arthritis   . Bipolar 1 disorder (HCC)   . Hypertension   . Obesity   . Psychiatric illness   . Schizo-affective schizophrenia (HCC)   . Seizures (HCC)   . Substance abuse [facility])     Patient Active Problem List   Diagnosis Date Noted  . Substance induced mood disorder (HCC) 12/27/2015  . Fever, unspecified 09/13/2015  . Leukocytosis 09/13/2015  . Acute encephalopathy 09/13/2015  . Substance abuse (HCC) 09/12/2015  . Hallucination   . Homicidal ideation   . Schizophrenia, unspecified type (HCC)   . Schizoaffective disorder, bipolar type (HCC)   . Schizoaffective  disorder (HCC) 03/29/2013  . Cocaine abuse (HCC) 06/30/2011  . Non-compliant patient 06/29/2011  . Hypertension 06/29/2011  . Alcohol dependence (HCC) 06/28/2011  . Polysubstance dependence (HCC) 04/03/2011  . Psychiatric problem 04/03/2011  . Polysubstance abuse (HCC) 06/21/2010  . Schizophrenia (HCC) 06/21/2010    Past Surgical History:  Procedure Laterality Date  . STOMACH SURGERY       OB History   No obstetric history on file.     Family History  Problem Relation Age of Onset  . Alzheimer's disease Mother   . Depression Mother     Social History   Tobacco Use  . Smoking status: Current Every Day Smoker    Packs/day: 1.00    Types: Cigarettes  . Smokeless tobacco: Never Used  Substance Use Topics  . Alcohol use: Yes    Alcohol/week: 6.0 standard drinks    Types: 6 Cans of beer per week  . Drug use: Yes    Frequency: 5.0 times per week    Types: "Crack" cocaine, Cocaine    Comment: crack/cocaine    Home Medications Prior to Admission medications   Medication Sig Start Date End Date Taking? Authorizing Provider  cephALEXin (KEFLEX) 500 MG capsule 1 cap po bid x 7 days 06/21/19   Gagandeep Kossman, Dahlia Client, PA-C  divalproex (DEPAKOTE ER) 500 MG 24 hr tablet Take 1 tablet (500 mg total) by mouth 2 (two) times daily. Patient not taking: Reported on 03/03/2019 12/28/15   Thermon Leyland, NP  hydrochlorothiazide (MICROZIDE) 12.5  MG capsule Take 1 capsule (12.5 mg total) by mouth daily. Patient not taking: Reported on 03/03/2019 12/16/18   Kendrick, Caitlyn S, PA-C  lisinopril (PRINIVIL,ZESTRIL) 5 MG tablet Take 1 tablet (5 mg total) by mouth daily. Patient not taking: Reported on 03/03/2019 12/28/15   Niel Hummer, NP  paliperidone (INVEGA SUSTENNA) 156 MG/ML SUSP injection Inject 1 mL (156 mg total) into the muscle every 30 (thirty) days. Patient not taking: Reported on 12/03/2016 12/28/15   Niel Hummer, NP  paliperidone (INVEGA SUSTENNA) 234 MG/1.5ML SUSP injection  Inject 234 mg into the muscle every 30 (thirty) days.    [provider]  paliperidone (INVEGA) 6 MG 24 hr tablet Take 6 mg by mouth daily.    [provider]  potassium chloride (K-DUR,KLOR-CON) 10 MEQ tablet Take 1 tablet (10 mEq total) by mouth daily. Patient not taking: Reported on 03/03/2019 12/28/15   Niel Hummer, NP  QUEtiapine (SEROQUEL) 100 MG tablet Take 100 mg by mouth at bedtime.    [provider]  risperiDONE (RISPERDAL) 2 MG tablet Take 1 tablet (2 mg total) by mouth 2 (two) times daily. Patient not taking: Reported on 03/03/2019 12/28/15   Niel Hummer, NP  topiramate (TOPAMAX) 50 MG tablet Take 50 mg by mouth. 09/16/16   [provider]  traZODone (DESYREL) 50 MG tablet Take 50 mg by mouth. 09/16/16   [provider]  Valbenazine Tosylate (INGREZZA) 40 MG CAPS Take 1 capsule by mouth daily.    [provider]    Allergies    Patient has no known allergies.  Review of Systems   Review of Systems  Constitutional: Negative for appetite change, diaphoresis, fatigue, fever and unexpected weight change.  HENT: Negative for mouth sores.   Eyes: Negative for visual disturbance.  Respiratory: Negative for cough, chest tightness, shortness of breath and wheezing.   Cardiovascular: Negative for chest pain.  Gastrointestinal: Negative for abdominal pain, constipation, diarrhea, nausea and vomiting.  Endocrine: Negative for polydipsia, polyphagia and polyuria.  Genitourinary: Positive for dysuria, frequency and urgency. Negative for hematuria.  Musculoskeletal: Positive for back pain. Negative for neck stiffness.  Skin: Negative for rash.  Allergic/Immunologic: Negative for immunocompromised state.  Neurological: Negative for syncope, light-headedness and headaches.  Hematological: Does not bruise/bleed easily.  Psychiatric/Behavioral: Negative for sleep disturbance. The patient is not nervous/anxious.     Physical  Exam Updated Vital Signs BP (!) 171/94 (BP Location: Left Arm)   Pulse 99   Temp 98.4 F (36.9 C) (Oral)   Resp 18   Ht 5\' 7"  (1.702 m)   Wt 130 kg   LMP 01/13/2012   SpO2 93%   BMI 44.89 kg/m   Physical Exam Vitals and nursing note reviewed.  Constitutional:      General: She is not in acute distress.    Appearance: She is well-developed. She is not diaphoretic.  HENT:     Head: Normocephalic and atraumatic.     Mouth/Throat:     Pharynx: No oropharyngeal exudate.  Eyes:     Conjunctiva/sclera: Conjunctivae normal.  Neck:     Comments: Full ROM without pain Cardiovascular:     Rate and Rhythm: Normal rate and regular rhythm.  Pulmonary:     Effort: Pulmonary effort is normal. No respiratory distress.     Breath sounds: Normal breath sounds. No wheezing.  Abdominal:     General: There is no distension.     Palpations: Abdomen is soft.  Tenderness: There is no abdominal tenderness. There is no right CVA tenderness or left CVA tenderness.  Musculoskeletal:     Cervical back: Normal range of motion and neck supple.     Comments: Full range of motion of the T-spine and L-spine No midline tenderness to the  T-spine or L-spine Mildtenderness to palpation of the paraspinous muscles of the L-spine  Lymphadenopathy:     Cervical: No cervical adenopathy.  Skin:    General: Skin is warm and dry.     Findings: No erythema or rash.  Neurological:     Mental Status: She is alert.     Comments: Speech is clear and goal oriented, follows commands Normal 5/5 strength in upper and lower extremities bilaterally including dorsiflexion and plantar flexion, strong and equal grip strength Sensation normal to light and sharp touch Moves extremities without ataxia, coordination intact Normal gait Normal balance No Clonus  Psychiatric:        Behavior: Behavior is agitated.     ED Results / Procedures / Treatments   Labs (all labs ordered are listed, but only abnormal results  are displayed) Labs Reviewed  URINALYSIS, ROUTINE W REFLEX MICROSCOPIC - Abnormal; Notable for the following components:      Result Value   Hgb urine dipstick SMALL (*)    Leukocytes,Ua SMALL (*)    Bacteria, UA RARE (*)    All other components within normal limits  URINE CULTURE    Procedures Procedures (including critical care time)  Medications Ordered in ED Medications  cephALEXin (KEFLEX) capsule 500 mg (has no administration in time range)  hydrochlorothiazide (MICROZIDE) capsule 25 mg (25 mg Oral Given 06/21/19 7124)    ED Course  I have reviewed the triage vital signs and the nursing notes.  Pertinent labs & imaging results that were available during my care of the patient were reviewed by me and considered in my medical decision making (see chart for details).    MDM Rules/Calculators/A&P                      Patient presents with complaints of low back pain.  She reports this is secondary to walking.  Additionally she has complaints of urinary symptoms.  UA shows evidence of urinary tract infection.  Urine culture sent and Keflex ordered.  Patient hypertensive here in the emergency department.  She is often hypertensive, has a history of same and is noncompliant with her medications.  HCTZ given here in the emergency department.  She has no chest pain or shortness of breath to suggest hypertensive urgency.  I suspect some of her hypertension is secondary to her crack cocaine usage.  Patient reports she feels well and wishes to go home.  If she is going to contact her ACT team member for assistance.    Final Clinical Impression(s) / ED Diagnoses Final diagnoses:  Bilateral low back pain without sciatica, unspecified chronicity  Acute cystitis without hematuria  Hypertension, unspecified type  Polysubstance abuse (HCC)  H/O medication noncompliance    Rx / DC Orders ED Discharge Orders         Ordered    cephALEXin (KEFLEX) 500 MG capsule     06/21/19 0659            Dontell Mian, Dahlia Client, PA-C 06/21/19 0704    Cardama, Amadeo Garnet, MD 06/21/19 [phone]

## 2019-06-21 NOTE — Discharge Instructions (Signed)
1. Medications: keflex, usual home medications 2. Treatment: rest, drink plenty of fluids, take medications as prescribed 3. Follow Up: Please followup with your primary doctor in 2-3 days for discussion of your diagnoses and further evaluation after today's visit; return to the ER for fevers, persistent vomiting, worsening abdominal pain or other concerning symptoms.

## 2019-06-22 LAB — URINE CULTURE

## 2019-06-27 ENCOUNTER — Other Ambulatory Visit: Payer: Self-pay

## 2019-06-27 ENCOUNTER — Inpatient Hospital Stay (HOSPITAL_COMMUNITY)
Admission: EM | Admit: 2019-06-27 | Discharge: 2019-07-19 | DRG: 004 | Disposition: A | Discharge status: Home Health | Payer: Medicare Other | Attending: Internal Medicine | Admitting: Internal Medicine

## 2019-06-27 ENCOUNTER — Encounter (HOSPITAL_COMMUNITY): Payer: Self-pay

## 2019-06-27 DIAGNOSIS — Z820 Family history of epilepsy and other diseases of the nervous system: Secondary | ICD-10-CM

## 2019-06-27 DIAGNOSIS — J9601 Acute respiratory failure with hypoxia: Secondary | ICD-10-CM | POA: Diagnosis present

## 2019-06-27 DIAGNOSIS — G40909 Epilepsy, unspecified, not intractable, without status epilepticus: Secondary | ICD-10-CM | POA: Diagnosis present

## 2019-06-27 DIAGNOSIS — Z6841 Body Mass Index (BMI) 40.0 and over, adult: Secondary | ICD-10-CM

## 2019-06-27 DIAGNOSIS — Z818 Family history of other mental and behavioral disorders: Secondary | ICD-10-CM

## 2019-06-27 DIAGNOSIS — J9600 Acute respiratory failure, unspecified whether with hypoxia or hypercapnia: Secondary | ICD-10-CM

## 2019-06-27 DIAGNOSIS — Z930 Tracheostomy status: Secondary | ICD-10-CM

## 2019-06-27 DIAGNOSIS — F319 Bipolar disorder, unspecified: Secondary | ICD-10-CM | POA: Diagnosis present

## 2019-06-27 DIAGNOSIS — F17210 Nicotine dependence, cigarettes, uncomplicated: Secondary | ICD-10-CM | POA: Diagnosis present

## 2019-06-27 DIAGNOSIS — N390 Urinary tract infection, site not specified: Secondary | ICD-10-CM | POA: Diagnosis present

## 2019-06-27 DIAGNOSIS — Z9119 Patient's noncompliance with other medical treatment and regimen: Secondary | ICD-10-CM

## 2019-06-27 DIAGNOSIS — M1990 Unspecified osteoarthritis, unspecified site: Secondary | ICD-10-CM | POA: Diagnosis present

## 2019-06-27 DIAGNOSIS — T405X1A Poisoning by cocaine, accidental (unintentional), initial encounter: Principal | ICD-10-CM | POA: Diagnosis present

## 2019-06-27 DIAGNOSIS — T465X5A Adverse effect of other antihypertensive drugs, initial encounter: Secondary | ICD-10-CM | POA: Diagnosis present

## 2019-06-27 DIAGNOSIS — T783XXA Angioneurotic edema, initial encounter: Principal | ICD-10-CM | POA: Diagnosis present

## 2019-06-27 DIAGNOSIS — E1165 Type 2 diabetes mellitus with hyperglycemia: Secondary | ICD-10-CM | POA: Diagnosis not present

## 2019-06-27 DIAGNOSIS — Z79899 Other long term (current) drug therapy: Secondary | ICD-10-CM

## 2019-06-27 DIAGNOSIS — Z20822 Contact with and (suspected) exposure to covid-19: Secondary | ICD-10-CM | POA: Diagnosis present

## 2019-06-27 DIAGNOSIS — R000 Tachycardia, unspecified: Secondary | ICD-10-CM | POA: Diagnosis not present

## 2019-06-27 DIAGNOSIS — G4733 Obstructive sleep apnea (adult) (pediatric): Secondary | ICD-10-CM | POA: Diagnosis present

## 2019-06-27 DIAGNOSIS — I10 Essential (primary) hypertension: Secondary | ICD-10-CM | POA: Diagnosis present

## 2019-06-27 DIAGNOSIS — J988 Other specified respiratory disorders: Secondary | ICD-10-CM

## 2019-06-27 DIAGNOSIS — F259 Schizoaffective disorder, unspecified: Secondary | ICD-10-CM | POA: Diagnosis present

## 2019-06-27 LAB — CBC WITH DIFFERENTIAL/PLATELET
Abs Immature Granulocytes: 0.06 10*3/uL (ref 0.00–0.07)
Basophils Absolute: 0.1 10*3/uL (ref 0.0–0.1)
Basophils Relative: 1 %
Eosinophils Absolute: 0.2 10*3/uL (ref 0.0–0.5)
Eosinophils Relative: 2 %
HCT: 43.1 % (ref 36.0–46.0)
Hemoglobin: 13.8 g/dL (ref 12.0–15.0)
Immature Granulocytes: 1 %
Lymphocytes Relative: 26 %
Lymphs Abs: 2.8 10*3/uL (ref 0.7–4.0)
MCH: 28.8 pg (ref 26.0–34.0)
MCHC: 32 g/dL (ref 30.0–36.0)
MCV: 90 fL (ref 80.0–100.0)
Monocytes Absolute: 0.9 10*3/uL (ref 0.1–1.0)
Monocytes Relative: 8 %
Neutro Abs: 6.6 10*3/uL (ref 1.7–7.7)
Neutrophils Relative %: 62 %
Platelets: 289 10*3/uL (ref 150–400)
RBC: 4.79 MIL/uL (ref 3.87–5.11)
RDW: 14.2 % (ref 11.5–15.5)
WBC: 10.6 10*3/uL — ABNORMAL HIGH (ref 4.0–10.5)
nRBC: 0 % (ref 0.0–0.2)

## 2019-06-27 LAB — BASIC METABOLIC PANEL
Anion gap: 13 (ref 5–15)
BUN: 17 mg/dL (ref 6–20)
CO2: 26 mmol/L (ref 22–32)
Calcium: 9.1 mg/dL (ref 8.9–10.3)
Chloride: 97 mmol/L — ABNORMAL LOW (ref 98–111)
Creatinine, Ser: 1.01 mg/dL — ABNORMAL HIGH (ref 0.44–1.00)
GFR calc Af Amer: >60 mL/min (ref >60)
GFR calc non Af Amer: >60 mL/min (ref >60)
Glucose, Bld: 132 mg/dL — ABNORMAL HIGH (ref 70–99)
Potassium: 4.2 mmol/L (ref 3.5–5.1)
Sodium: 136 mmol/L (ref 135–145)

## 2019-06-27 LAB — RESPIRATORY PANEL BY RT PCR (FLU A&B, COVID)
Influenza A by PCR: NEGATIVE
Influenza B by PCR: NEGATIVE
SARS Coronavirus 2 by RT PCR: NEGATIVE

## 2019-06-27 NOTE — Code Documentation (Addendum)
Anesthesiologist at bedside for difficult intubation.

## 2019-06-27 NOTE — Code Documentation (Addendum)
Anesthesiologist having difficult with intubation. Attempted x4.

## 2019-06-27 NOTE — Code Documentation (Addendum)
Anesthesiologist still having difficulty with intubation. Anesthesiologist giving  50 mg propofol and attempting intubation .

## 2019-06-27 NOTE — ED Notes (Signed)
Called ENT to Dr Charm Barges

## 2019-06-27 NOTE — ED Triage Notes (Signed)
Pt arrived via [facility] from home c/o allergic reaction. Pt stated she was at the store earlier and swatted something off her face and began to have an allergic reaction. Pt has severe swelling to her face, lips and neck. Pt's speech is garbled. Pt was placed on 3L [place].

## 2019-06-27 NOTE — Code Documentation (Signed)
ENT paged to Dr. Charm Barges due to not being able to intubate pt.

## 2019-06-28 ENCOUNTER — Encounter (HOSPITAL_COMMUNITY)
Admission: EM | Disposition: A | Discharge status: Home Health | Payer: Self-pay | Source: Home / Self Care | Attending: Internal Medicine

## 2019-06-28 ENCOUNTER — Emergency Department (HOSPITAL_COMMUNITY): Payer: Medicare Other | Admitting: Registered Nurse

## 2019-06-28 ENCOUNTER — Inpatient Hospital Stay (HOSPITAL_COMMUNITY): Payer: Medicare Other

## 2019-06-28 DIAGNOSIS — T465X5A Adverse effect of other antihypertensive drugs, initial encounter: Secondary | ICD-10-CM | POA: Diagnosis present

## 2019-06-28 DIAGNOSIS — J9600 Acute respiratory failure, unspecified whether with hypoxia or hypercapnia: Secondary | ICD-10-CM | POA: Diagnosis not present

## 2019-06-28 DIAGNOSIS — Z9119 Patient's noncompliance with other medical treatment and regimen: Secondary | ICD-10-CM | POA: Diagnosis not present

## 2019-06-28 DIAGNOSIS — T783XXS Angioneurotic edema, sequela: Secondary | ICD-10-CM | POA: Diagnosis not present

## 2019-06-28 DIAGNOSIS — F319 Bipolar disorder, unspecified: Secondary | ICD-10-CM | POA: Diagnosis present

## 2019-06-28 DIAGNOSIS — T783XXA Angioneurotic edema, initial encounter: Secondary | ICD-10-CM | POA: Diagnosis present

## 2019-06-28 DIAGNOSIS — J9601 Acute respiratory failure with hypoxia: Secondary | ICD-10-CM

## 2019-06-28 DIAGNOSIS — N390 Urinary tract infection, site not specified: Secondary | ICD-10-CM | POA: Diagnosis present

## 2019-06-28 DIAGNOSIS — F17210 Nicotine dependence, cigarettes, uncomplicated: Secondary | ICD-10-CM | POA: Diagnosis present

## 2019-06-28 DIAGNOSIS — F259 Schizoaffective disorder, unspecified: Secondary | ICD-10-CM | POA: Diagnosis present

## 2019-06-28 DIAGNOSIS — Z818 Family history of other mental and behavioral disorders: Secondary | ICD-10-CM | POA: Diagnosis not present

## 2019-06-28 DIAGNOSIS — I10 Essential (primary) hypertension: Secondary | ICD-10-CM | POA: Diagnosis present

## 2019-06-28 DIAGNOSIS — Z930 Tracheostomy status: Secondary | ICD-10-CM | POA: Diagnosis not present

## 2019-06-28 DIAGNOSIS — Z820 Family history of epilepsy and other diseases of the nervous system: Secondary | ICD-10-CM | POA: Diagnosis not present

## 2019-06-28 DIAGNOSIS — G4733 Obstructive sleep apnea (adult) (pediatric): Secondary | ICD-10-CM | POA: Diagnosis present

## 2019-06-28 DIAGNOSIS — T405X1A Poisoning by cocaine, accidental (unintentional), initial encounter: Secondary | ICD-10-CM | POA: Diagnosis present

## 2019-06-28 DIAGNOSIS — Z79899 Other long term (current) drug therapy: Secondary | ICD-10-CM | POA: Diagnosis not present

## 2019-06-28 DIAGNOSIS — M1990 Unspecified osteoarthritis, unspecified site: Secondary | ICD-10-CM | POA: Diagnosis present

## 2019-06-28 DIAGNOSIS — Z6841 Body Mass Index (BMI) 40.0 and over, adult: Secondary | ICD-10-CM | POA: Diagnosis not present

## 2019-06-28 DIAGNOSIS — G40909 Epilepsy, unspecified, not intractable, without status epilepticus: Secondary | ICD-10-CM | POA: Diagnosis present

## 2019-06-28 DIAGNOSIS — J988 Other specified respiratory disorders: Secondary | ICD-10-CM | POA: Diagnosis not present

## 2019-06-28 DIAGNOSIS — R000 Tachycardia, unspecified: Secondary | ICD-10-CM | POA: Diagnosis present

## 2019-06-28 DIAGNOSIS — Z20822 Contact with and (suspected) exposure to covid-19: Secondary | ICD-10-CM | POA: Diagnosis present

## 2019-06-28 DIAGNOSIS — E1165 Type 2 diabetes mellitus with hyperglycemia: Secondary | ICD-10-CM | POA: Diagnosis not present

## 2019-06-28 DIAGNOSIS — E119 Type 2 diabetes mellitus without complications: Secondary | ICD-10-CM | POA: Diagnosis not present

## 2019-06-28 HISTORY — PX: TRACHEOSTOMY TUBE PLACEMENT: SHX814

## 2019-06-28 LAB — RAPID URINE DRUG SCREEN, HOSP PERFORMED
Amphetamines: NOT DETECTED
Barbiturates: NOT DETECTED
Benzodiazepines: POSITIVE — AB
Cocaine: POSITIVE — AB
Opiates: NOT DETECTED
Tetrahydrocannabinol: NOT DETECTED

## 2019-06-28 LAB — GLUCOSE, CAPILLARY
Glucose-Capillary: 205 mg/dL — ABNORMAL HIGH (ref 70–99)
Glucose-Capillary: 222 mg/dL — ABNORMAL HIGH (ref 70–99)
Glucose-Capillary: 281 mg/dL — ABNORMAL HIGH (ref 70–99)
Glucose-Capillary: 227 mg/dL — ABNORMAL HIGH (ref 70–99)
Glucose-Capillary: 196 mg/dL — ABNORMAL HIGH (ref 70–99)
Glucose-Capillary: 209 mg/dL — ABNORMAL HIGH (ref 70–99)

## 2019-06-28 LAB — BASIC METABOLIC PANEL
Anion gap: 12 (ref 5–15)
BUN: 17 mg/dL (ref 6–20)
CO2: 24 mmol/L (ref 22–32)
Calcium: 8.6 mg/dL — ABNORMAL LOW (ref 8.9–10.3)
Chloride: 98 mmol/L (ref 98–111)
Creatinine, Ser: 1.01 mg/dL — ABNORMAL HIGH (ref 0.44–1.00)
GFR calc Af Amer: >60 mL/min (ref >60)
GFR calc non Af Amer: >60 mL/min (ref >60)
Glucose, Bld: 212 mg/dL — ABNORMAL HIGH (ref 70–99)
Potassium: 4.6 mmol/L (ref 3.5–5.1)
Sodium: 134 mmol/L — ABNORMAL LOW (ref 135–145)

## 2019-06-28 LAB — POCT I-STAT 7, (LYTES, BLD GAS, ICA,H+H)
Bicarbonate: 26.8 mmol/L (ref 20.0–28.0)
Calcium, Ion: 1.17 mmol/L (ref 1.15–1.40)
HCT: 38 % (ref 36.0–46.0)
Hemoglobin: 12.9 g/dL (ref 12.0–15.0)
O2 Saturation: 93 %
Patient temperature: 98.6
Potassium: 4.5 mmol/L (ref 3.5–5.1)
Sodium: 133 mmol/L — ABNORMAL LOW (ref 135–145)
TCO2: 28 mmol/L (ref 22–32)
pCO2 arterial: 53.6 mmHg — ABNORMAL HIGH (ref 32.0–48.0)
pH, Arterial: 7.308 — ABNORMAL LOW (ref 7.350–7.450)
pO2, Arterial: 75 mmHg — ABNORMAL LOW (ref 83.0–108.0)

## 2019-06-28 LAB — TRIGLYCERIDES: Triglycerides: 89 mg/dL (ref <150)

## 2019-06-28 LAB — MRSA PCR SCREENING: MRSA by PCR: NEGATIVE

## 2019-06-28 LAB — CBC
HCT: 39 % (ref 36.0–46.0)
Hemoglobin: 12.6 g/dL (ref 12.0–15.0)
MCH: 29 pg (ref 26.0–34.0)
MCHC: 32.3 g/dL (ref 30.0–36.0)
MCV: 89.7 fL (ref 80.0–100.0)
Platelets: 257 10*3/uL (ref 150–400)
RBC: 4.35 MIL/uL (ref 3.87–5.11)
RDW: 14.2 % (ref 11.5–15.5)
WBC: 10.9 10*3/uL — ABNORMAL HIGH (ref 4.0–10.5)
nRBC: 0 % (ref 0.0–0.2)

## 2019-06-28 LAB — HIV ANTIBODY (ROUTINE TESTING W REFLEX): HIV Screen 4th Generation wRfx: NONREACTIVE

## 2019-06-28 LAB — PHOSPHORUS: Phosphorus: 4.6 mg/dL (ref 2.5–4.6)

## 2019-06-28 LAB — PROTIME-INR
INR: 1 (ref 0.8–1.2)
Prothrombin Time: 12.8 seconds (ref 11.4–15.2)

## 2019-06-28 LAB — MAGNESIUM: Magnesium: 2 mg/dL (ref 1.7–2.4)

## 2019-06-28 LAB — PROCALCITONIN: Procalcitonin: <0.1 ng/mL

## 2019-06-28 LAB — C-REACTIVE PROTEIN: CRP: 1 mg/dL — ABNORMAL HIGH (ref <1.0)

## 2019-06-28 LAB — SEDIMENTATION RATE: Sed Rate: 34 mm/hr — ABNORMAL HIGH (ref 0–22)

## 2019-06-28 SURGERY — CREATION, TRACHEOSTOMY
Anesthesia: Monitor Anesthesia Care | Site: Neck

## 2019-06-28 MED ORDER — FAMOTIDINE 20 MG PO TABS: 20.0000 mg | ORAL_TABLET | Freq: Two times a day (BID) | ORAL | Status: DC

## 2019-06-28 MED ORDER — FENTANYL CITRATE (PF) 100 MCG/2ML IJ SOLN
25.0000 ug | INTRAMUSCULAR | Status: DC | PRN
  Administered 2019-06-29: 01:00:00 25 ug via INTRAVENOUS
  Filled 2019-06-28: qty 2

## 2019-06-28 MED ORDER — PHENYLEPHRINE HCL (PRESSORS) 10 MG/ML IV SOLN
INTRAVENOUS | Status: DC | PRN
  Administered 2019-06-28 (×6): 120 ug via INTRAVENOUS

## 2019-06-28 MED ORDER — LIDOCAINE-EPINEPHRINE 1 %-1:100000 IJ SOLN
INTRAMUSCULAR | Status: AC
  Filled 2019-06-28: qty 1

## 2019-06-28 MED ORDER — LACTATED RINGERS IV SOLN: INTRAVENOUS | Status: DC

## 2019-06-28 MED ORDER — HEMOSTATIC AGENTS (NO CHARGE) OPTIME
TOPICAL | Status: DC | PRN
  Administered 2019-06-28: 1

## 2019-06-28 MED ORDER — POLYETHYLENE GLYCOL 3350 17 G PO PACK
17.0000 g | PACK | Freq: Every day | ORAL | Status: DC | PRN
  Administered 2019-07-02: 09:00:00 17 g
  Filled 2019-06-28: qty 1

## 2019-06-28 MED ORDER — ORAL CARE MOUTH RINSE
15.0000 mL | OROMUCOSAL | Status: DC
  Administered 2019-06-28 – 2019-07-08 (×87): 15 mL via OROMUCOSAL

## 2019-06-28 MED ORDER — CISATRACURIUM BESYLATE 20 MG/10ML IV SOLN
INTRAVENOUS | Status: AC
  Filled 2019-06-28: qty 10

## 2019-06-28 MED ORDER — CISATRACURIUM BESYLATE (PF) 10 MG/5ML IV SOLN
INTRAVENOUS | Status: DC | PRN
  Administered 2019-06-28: 4 mg via INTRAVENOUS
  Administered 2019-06-28: 6 mg via INTRAVENOUS

## 2019-06-28 MED ORDER — LIDOCAINE-EPINEPHRINE 1 %-1:100000 IJ SOLN
INTRAMUSCULAR | Status: DC | PRN
  Administered 2019-06-28: 10 mL

## 2019-06-28 MED ORDER — PROPOFOL 10 MG/ML IV BOLUS
INTRAVENOUS | Status: AC
  Filled 2019-06-28: qty 20

## 2019-06-28 MED ORDER — DIPHENHYDRAMINE HCL 50 MG/ML IJ SOLN
25.0000 mg | Freq: Four times a day (QID) | INTRAMUSCULAR | Status: AC
  Administered 2019-06-28 (×4): 25 mg via INTRAVENOUS
  Filled 2019-06-28 (×4): qty 1

## 2019-06-28 MED ORDER — LACTATED RINGERS IV SOLN: INTRAVENOUS | Status: DC | PRN

## 2019-06-28 MED ORDER — MIDAZOLAM HCL 2 MG/2ML IJ SOLN
INTRAMUSCULAR | Status: AC
  Filled 2019-06-28: qty 2

## 2019-06-28 MED ORDER — WHITE PETROLATUM EX OINT
TOPICAL_OINTMENT | CUTANEOUS | Status: AC
  Filled 2019-06-28: qty 28.35

## 2019-06-28 MED ORDER — LIDOCAINE HCL (PF) 1 % IJ SOLN
INTRAMUSCULAR | Status: AC
  Filled 2019-06-28: qty 30

## 2019-06-28 MED ORDER — SUGAMMADEX SODIUM 200 MG/2ML IV SOLN
INTRAVENOUS | Status: DC | PRN
  Administered 2019-06-28: 260 mg via INTRAVENOUS

## 2019-06-28 MED ORDER — HEPARIN SODIUM (PORCINE) 5000 UNIT/ML IJ SOLN: 5000.0000 [IU] | Freq: Three times a day (TID) | INTRAMUSCULAR | Status: DC

## 2019-06-28 MED ORDER — MIDAZOLAM HCL 5 MG/5ML IJ SOLN
INTRAMUSCULAR | Status: DC | PRN
  Administered 2019-06-28: 1 mg via INTRAVENOUS

## 2019-06-28 MED ORDER — FENTANYL CITRATE (PF) 100 MCG/2ML IJ SOLN
25.0000 ug | INTRAMUSCULAR | Status: DC | PRN
  Administered 2019-06-29 – 2019-07-02 (×10): 100 ug via INTRAVENOUS
  Filled 2019-06-28 (×10): qty 2

## 2019-06-28 MED ORDER — INSULIN ASPART 100 UNIT/ML ~~LOC~~ SOLN
3.0000 [IU] | SUBCUTANEOUS | Status: DC
  Administered 2019-06-28 (×2): 9 [IU] via SUBCUTANEOUS
  Administered 2019-06-28: 6 [IU] via SUBCUTANEOUS
  Administered 2019-06-29 (×2): 9 [IU] via SUBCUTANEOUS
  Administered 2019-06-29: 6 [IU] via SUBCUTANEOUS
  Administered 2019-06-29: 9 [IU] via SUBCUTANEOUS
  Administered 2019-06-29: 6 [IU] via SUBCUTANEOUS
  Administered 2019-06-30 (×2): 3 [IU] via SUBCUTANEOUS
  Administered 2019-06-30: 6 [IU] via SUBCUTANEOUS
  Administered 2019-06-30: 3 [IU] via SUBCUTANEOUS
  Administered 2019-07-01: 6 [IU] via SUBCUTANEOUS
  Administered 2019-07-01: 3 [IU] via SUBCUTANEOUS
  Administered 2019-07-01 (×2): 6 [IU] via SUBCUTANEOUS
  Administered 2019-07-01 – 2019-07-02 (×2): 3 [IU] via SUBCUTANEOUS
  Administered 2019-07-02 – 2019-07-03 (×2): 6 [IU] via SUBCUTANEOUS
  Administered 2019-07-03: 3 [IU] via SUBCUTANEOUS
  Administered 2019-07-03 (×2): 6 [IU] via SUBCUTANEOUS
  Administered 2019-07-03: 3 [IU] via SUBCUTANEOUS
  Administered 2019-07-04: 6 [IU] via SUBCUTANEOUS
  Administered 2019-07-04 (×3): 3 [IU] via SUBCUTANEOUS
  Administered 2019-07-04: 6 [IU] via SUBCUTANEOUS
  Administered 2019-07-04 – 2019-07-05 (×2): 3 [IU] via SUBCUTANEOUS
  Administered 2019-07-05 (×2): 6 [IU] via SUBCUTANEOUS
  Administered 2019-07-05 (×2): 3 [IU] via SUBCUTANEOUS
  Administered 2019-07-06 (×3): 6 [IU] via SUBCUTANEOUS
  Administered 2019-07-07 (×2): 9 [IU] via SUBCUTANEOUS
  Administered 2019-07-07: 3 [IU] via SUBCUTANEOUS
  Administered 2019-07-07 (×2): 6 [IU] via SUBCUTANEOUS
  Administered 2019-07-08: 9 [IU] via SUBCUTANEOUS
  Administered 2019-07-08: 6 [IU] via SUBCUTANEOUS
  Administered 2019-07-08: 9 [IU] via SUBCUTANEOUS
  Administered 2019-07-09: 3 [IU] via SUBCUTANEOUS

## 2019-06-28 MED ORDER — PROPOFOL 1000 MG/100ML IV EMUL
INTRAVENOUS | Status: AC
  Filled 2019-06-28: qty 100

## 2019-06-28 MED ORDER — METHYLPREDNISOLONE SODIUM SUCC 125 MG IJ SOLR
60.0000 mg | Freq: Three times a day (TID) | INTRAMUSCULAR | Status: DC
  Administered 2019-06-28 – 2019-06-29 (×4): 60 mg via INTRAVENOUS
  Filled 2019-06-28 (×5): qty 2

## 2019-06-28 MED ORDER — ADULT MULTIVITAMIN W/MINERALS CH
1.0000 | ORAL_TABLET | Freq: Every day | ORAL | Status: DC
  Administered 2019-06-28 – 2019-07-07 (×10): 1
  Filled 2019-06-28 (×10): qty 1

## 2019-06-28 MED ORDER — PROPOFOL 500 MG/50ML IV EMUL
INTRAVENOUS | Status: DC | PRN
  Administered 2019-06-28: 50 ug/kg/min via INTRAVENOUS

## 2019-06-28 MED ORDER — VITAL HIGH PROTEIN PO LIQD
1000.0000 mL | ORAL | Status: DC
  Administered 2019-06-28 – 2019-07-01 (×4): 1000 mL

## 2019-06-28 MED ORDER — SODIUM CHLORIDE 0.9 % IV SOLN
INTRAVENOUS | Status: DC | PRN
  Administered 2019-06-28: 03:00:00 250 mL via INTRAVENOUS

## 2019-06-28 MED ORDER — LIDOCAINE-EPINEPHRINE 0.5 %-1:200000 IJ SOLN
INTRAMUSCULAR | Status: AC
  Filled 2019-06-28: qty 1

## 2019-06-28 MED ORDER — PROPOFOL 10 MG/ML IV BOLUS
INTRAVENOUS | Status: DC | PRN
  Administered 2019-06-28: 50 mg via INTRAVENOUS
  Administered 2019-06-28: 120 mg via INTRAVENOUS

## 2019-06-28 MED ORDER — PRO-STAT SUGAR FREE PO LIQD
60.0000 mL | Freq: Three times a day (TID) | ORAL | Status: DC
  Administered 2019-06-28 – 2019-07-02 (×12): 60 mL
  Filled 2019-06-28 (×11): qty 60

## 2019-06-28 MED ORDER — FAMOTIDINE IN NACL 20-0.9 MG/50ML-% IV SOLN
20.0000 mg | Freq: Two times a day (BID) | INTRAVENOUS | Status: DC
  Administered 2019-06-28 – 2019-07-05 (×14): 20 mg via INTRAVENOUS
  Filled 2019-06-28 (×16): qty 50

## 2019-06-28 MED ORDER — EPHEDRINE SULFATE 50 MG/ML IJ SOLN
INTRAMUSCULAR | Status: DC | PRN
  Administered 2019-06-28 (×4): 5 mg via INTRAVENOUS
  Administered 2019-06-28 (×2): 10 mg via INTRAVENOUS

## 2019-06-28 MED ORDER — PROPOFOL 1000 MG/100ML IV EMUL
0.0000 ug/kg/min | INTRAVENOUS | Status: DC
  Administered 2019-06-28 – 2019-06-29 (×12): 50 ug/kg/min via INTRAVENOUS
  Administered 2019-06-29: 12:00:00 40 ug/kg/min via INTRAVENOUS
  Filled 2019-06-28 (×12): qty 100

## 2019-06-28 MED ORDER — DOCUSATE SODIUM 50 MG/5ML PO LIQD
100.0000 mg | Freq: Two times a day (BID) | ORAL | Status: DC | PRN
  Administered 2019-07-02: 100 mg
  Filled 2019-06-28: qty 10

## 2019-06-28 MED ORDER — 0.9 % SODIUM CHLORIDE (POUR BTL) OPTIME
TOPICAL | Status: DC | PRN
  Administered 2019-06-28 (×2): 1000 mL

## 2019-06-28 MED ORDER — CHLORHEXIDINE GLUCONATE 0.12% ORAL RINSE (MEDLINE KIT)
15.0000 mL | Freq: Two times a day (BID) | OROMUCOSAL | Status: DC
  Administered 2019-06-28 – 2019-07-19 (×34): 15 mL via OROMUCOSAL

## 2019-06-28 MED ORDER — CHLORHEXIDINE GLUCONATE CLOTH 2 % EX PADS
6.0000 | MEDICATED_PAD | Freq: Every day | CUTANEOUS | Status: DC
  Administered 2019-06-29 – 2019-07-19 (×16): 6 via TOPICAL

## 2019-06-28 MED ORDER — ROCURONIUM BROMIDE 100 MG/10ML IV SOLN
INTRAVENOUS | Status: DC | PRN
  Administered 2019-06-28: 50 mg via INTRAVENOUS

## 2019-06-28 MED ORDER — LIDOCAINE-EPINEPHRINE 0.5 %-1:200000 IJ SOLN
INTRAMUSCULAR | Status: DC | PRN
  Administered 2019-06-28: 10 mL

## 2019-06-28 SURGICAL SUPPLY — 42 items
APL SKNCLS STERI-STRIP NONHPOA (GAUZE/BANDAGES/DRESSINGS)
BENZOIN TINCTURE PRP APPL 2/3 (GAUZE/BANDAGES/DRESSINGS) IMPLANT
BLADE CLIPPER SURG (BLADE) IMPLANT
CANISTER SUCT 3000ML PPV (MISCELLANEOUS) ×3 IMPLANT
CLEANER TIP ELECTROSURG 2X2 (MISCELLANEOUS) ×6 IMPLANT
COVER SURGICAL LIGHT HANDLE (MISCELLANEOUS) ×6 IMPLANT
COVER WAND RF STERILE (DRAPES) ×3 IMPLANT
DECANTER SPIKE VIAL GLASS SM (MISCELLANEOUS) ×3 IMPLANT
DRAPE HALF SHEET 40X57 (DRAPES) IMPLANT
ELECT COATED BLADE 2.86 ST (ELECTRODE) ×6 IMPLANT
ELECT REM PT RETURN 9FT ADLT (ELECTROSURGICAL) ×6
ELECTRODE REM PT RTRN 9FT ADLT (ELECTROSURGICAL) ×2 IMPLANT
GAUZE 4X4 16PLY RFD (DISPOSABLE) ×6 IMPLANT
GAUZE SPONGE 4X4 12PLY STRL (GAUZE/BANDAGES/DRESSINGS) ×3 IMPLANT
GLOVE ECLIPSE 7.5 STRL STRAW (GLOVE) ×3 IMPLANT
GOWN STRL REUS W/ TWL LRG LVL3 (GOWN DISPOSABLE) ×2 IMPLANT
GOWN STRL REUS W/TWL LRG LVL3 (GOWN DISPOSABLE) ×6
HEMOSTAT SURGICEL 2X14 (HEMOSTASIS) ×3 IMPLANT
HOLDER TRACH TUBE VELCRO 19.5 (MISCELLANEOUS) ×3 IMPLANT
KIT BASIN OR (CUSTOM PROCEDURE TRAY) IMPLANT
KIT TURNOVER KIT B (KITS) ×3 IMPLANT
NEEDLE PRECISIONGLIDE 27X1.5 (NEEDLE) ×9 IMPLANT
NS IRRIG 1000ML POUR BTL (IV SOLUTION) ×3 IMPLANT
PACK EENT II TURBAN DRAPE (CUSTOM PROCEDURE TRAY) ×6 IMPLANT
PAD ARMBOARD 7.5X6 YLW CONV (MISCELLANEOUS) ×6 IMPLANT
PENCIL FOOT CONTROL (ELECTRODE) ×6 IMPLANT
SPONGE DRAIN TRACH 4X4 STRL 2S (GAUZE/BANDAGES/DRESSINGS) ×6 IMPLANT
SUT CHROMIC 2 0 SH (SUTURE) ×3 IMPLANT
SUT ETHILON 3 0 PS 1 (SUTURE) ×6 IMPLANT
SUT SILK 2 0 SH (SUTURE) ×12 IMPLANT
SUT SILK 4 0 (SUTURE) ×3
SUT SILK 4 0 TIE 10X30 (SUTURE) ×3 IMPLANT
SUT SILK 4-0 18XBRD TIE 12 (SUTURE) ×1 IMPLANT
SYR 20ML LL LF (SYRINGE) ×3 IMPLANT
SYR CONTROL 10ML LL (SYRINGE) ×6 IMPLANT
TOWEL GREEN STERILE FF (TOWEL DISPOSABLE) ×3 IMPLANT
TRAY ENT MC OR (CUSTOM PROCEDURE TRAY) ×6 IMPLANT
TUBE CONNECTING 12'X1/4 (SUCTIONS) ×1
TUBE CONNECTING 12X1/4 (SUCTIONS) ×2 IMPLANT
TUBE TRACH SHILEY 8 DIST CUF (TUBING) ×3 IMPLANT
WATER STERILE IRR 1000ML POUR (IV SOLUTION) IMPLANT
YANKAUER SUCT BULB TIP NO VENT (SUCTIONS) ×3 IMPLANT

## 2019-06-28 NOTE — Progress Notes (Addendum)
NAME:  [name], MRN:  [id number], DOB:  Oct 21, 1960, LOS: 0 ADMISSION DATE:  06/27/2019, CONSULTATION DATE:  4/16 REFERRING MD:  Dr. Melina Copa, CHIEF COMPLAINT:  Angioedema   Brief History   59 year old female presented with angioedema and airway compromise. Unable to be intubated and ED and was taken for emergent tracheostomy.   History of present illness   Patient is encephalopathic and/or intubated. Therefore history has been obtained from chart review. 59 year old female with PMH as below, which is significant for HTN, schizoaffective disorder, bipolar disorder, seizures, and (cocaine) substance abuse. She presented to [facility] ED in the late PM hours of 4/15 with complaints of face and throat swelling. On initial assessment she was able to speak a few garbled words at a time. The decision was made to intubate the patient for airway protection and anesthesia was called for difficult airway. Unfortunately the anesthesiologist was unable to intubate the patient and she was taken emergently to OR for tracheostomy. PCCM asked to admit to ICU.   Past Medical History   has a past medical history of Arthritis, Bipolar 1 disorder ([place]), Hypertension, Obesity, Psychiatric illness, Schizo-affective schizophrenia ([place]), Seizures ([place]), and Substance abuse ([place]).  Significant Hospital Events   4/15: Admitted for angioedema and taken for emergent tracheostomy   Consults:  Anesthesia   Procedures:  Tracheostomy 4/16 >  Significant Diagnostic Tests:  None  Micro Data:  4/15: SARs COVID Negative   Antimicrobials:   None  Interim history/subjective:  Remains sedated with Propofol 50 mcg/hr.   Objective   Blood pressure 121/72, pulse 81, temperature 98.6 F (37 C), temperature source Oral, resp. rate 18, weight 115.8 kg, last menstrual period 01/13/2012, SpO2 98 %.    Vent Mode: PRVC FiO2 (%):  [80 %-100 %] 80 % Set Rate:  [16 bmp-18 bmp] 18 bmp Vt Set:  [490 mL] 490 mL PEEP:  [5  cmH20-12 cmH20] 12 cmH20 Plateau Pressure:  [25 cmH20-29 cmH20] 29 cmH20   Intake/Output Summary (Last 24 hours) at 06/28/2019 0739 Last data filed at 06/28/2019 0600 Gross per 24 hour  Intake 1806.62 ml  Output 375 ml  Net 1431.62 ml   Filed Weights   06/28/19 0247  Weight: 115.8 kg    Examination: General: obese female on vent HENT: [place]/AT, PERRL, no JVD. Lipoma noted on forehead.  Lungs: Rhonchi Cardiovascular: RRR, no MRG Abdomen: Soft, non-distended Extremities: No acute deformity Neuro: Sedated RASS -3  Resolved Hospital Problem list     Assessment & Plan:   Acute hypoxemic respiratory failure:  Angioedema, with airway compromise.  - ACE-inhibitor vs cocaine use (UDS +). ESR and CRP elevated which can be seen in bradykinin induce angioedema. Will follow-up C4 to determine whether C1 inhibitor testing should be pursued. - Continue famotidine, solumedrol, and benadryl  - Lisinopril added to allergy list  - ENT requesting deep sedation due to bleeding. Maintain RASS -1 to -2 with propofol. Will coordinate with ENT as to when we can begin to wean sedation  - Remains on full vent support with FiO2 of 80% and PEEP of 12. Will wean as tolerated.  - CXR with L>R consolidation. Possibly due to aspiration of blood during emergent trach. No Abx currently.   HTN - Holding lisinopril  - Keep SBP < 160  Schizoaffective disorder Bipolar disorder - Unclear which home medications she is taking. Hold home depakote and topamax.  Seizure history - Will hold topamax and Depakote as above.   Best  practice:  Diet: NPO Pain/Anxiety/Delirium protocol (if indicated): Propofol VAP protocol (if indicated): yes DVT prophylaxis: SCD GI prophylaxis: pepcid Glucose control: NA Mobility: BR Code Status: FULL Family Communication: Will try to contact family   Disposition: ICU  Labs   CBC: Recent Labs  Lab 06/27/19 2242 06/28/19 0311 06/28/19 0330  WBC 10.6*  --  10.9*  NEUTROABS  6.6  --   --   HGB 13.8 12.9 12.6  HCT 43.1 38.0 39.0  MCV 90.0  --  89.7  PLT 289  --  585    Basic Metabolic Panel: Recent Labs  Lab 06/27/19 2242 06/28/19 0311 06/28/19 0330  NA 136 133* 134*  K 4.2 4.5 4.6  CL 97*  --  98  CO2 26  --  24  GLUCOSE 132*  --  212*  BUN 17  --  17  CREATININE 1.01*  --  1.01*  CALCIUM 9.1  --  8.6*  MG  --   --  2.0  PHOS  --   --  4.6   GFR: Estimated Creatinine Clearance: 80.8 mL/min (A) (by C-G formula based on SCr of 1.01 mg/dL (H)). Recent Labs  Lab 06/27/19 2242 06/28/19 0330  PROCALCITON  --  <0.10  WBC 10.6* 10.9*    Liver Function Tests: No results for input(s): AST, ALT, ALKPHOS, BILITOT, PROT, ALBUMIN in the last 168 hours. No results for input(s): LIPASE, AMYLASE in the last 168 hours. No results for input(s): AMMONIA in the last 168 hours.  ABG    Component Value Date/Time   PHART 7.308 (L) 06/28/2019 0311   PCO2ART 53.6 (H) 06/28/2019 0311   PO2ART 75.0 (L) 06/28/2019 0311   HCO3 26.8 06/28/2019 0311   TCO2 28 06/28/2019 0311   ACIDBASEDEF 1.5 01/30/2008 0608   O2SAT 93.0 06/28/2019 0311     Coagulation Profile: Recent Labs  Lab 06/27/19 2242  INR 1.0    Cardiac Enzymes: No results for input(s): CKTOTAL, CKMB, CKMBINDEX, TROPONINI in the last 168 hours.  HbA1C: Hgb A1c MFr Bld  Date/Time Value Ref Range Status  06/21/2010 03:52 PM (H) <5.7 % Final   5.9 (NOTE)                                                                       According to the ADA Clinical Practice Recommendations for 2011, when HbA1c is used as a screening test:   >=6.5%   Diagnostic of Diabetes Mellitus           (if abnormal result  is confirmed)  5.7-6.4%   Increased risk of developing Diabetes Mellitus  References:Diagnosis and Classification of Diabetes Mellitus,Diabetes IDPO,2423,53(IRWER 1):S62-S69 and Standards of Medical Care in         Diabetes - 2011,Diabetes XVQM,0867,61  (Suppl 1):S11-S61.    CBG: Recent Labs  Lab  06/28/19 0229 06/28/19 0407  GLUCAP 205* 222*    Review of Systems:   Unable as patient is encephalopathic and intubated.   Past Medical History  She,  has a past medical history of Arthritis, Bipolar 1 disorder ([employer]), Hypertension, Obesity, Psychiatric illness, Schizo-affective schizophrenia ([place]), Seizures ([place]), and Substance abuse ([place]).   Surgical History    Past Surgical History:  Procedure Laterality Date  .  STOMACH SURGERY       Social History   reports that she has been smoking cigarettes. She has been smoking about 1.00 pack per day. She has never used smokeless tobacco. She reports current alcohol use of about 6.0 standard drinks of alcohol per week. She reports current drug use. Frequency: 5.00 times per week. Drugs: "Crack" cocaine and Cocaine.   Family History   Her family history includes Alzheimer's disease in her mother; Depression in her mother.   Allergies No Known Allergies   Home Medications  Prior to Admission medications   Medication Sig Start Date End Date Taking? Authorizing Provider  cephALEXin (KEFLEX) 500 MG capsule 1 cap po bid x 7 days 06/21/19   Muthersbaugh, Jarrett Soho, PA-C  divalproex (DEPAKOTE ER) 500 MG 24 hr tablet Take 1 tablet (500 mg total) by mouth 2 (two) times daily. Patient not taking: Reported on 03/03/2019 12/28/15   Niel Hummer, NP  hydrochlorothiazide (MICROZIDE) 12.5 MG capsule Take 1 capsule (12.5 mg total) by mouth daily. Patient not taking: Reported on 03/03/2019 12/16/18   Kendrick, Caitlyn S, PA-C  lisinopril (PRINIVIL,ZESTRIL) 5 MG tablet Take 1 tablet (5 mg total) by mouth daily. Patient not taking: Reported on 03/03/2019 12/28/15   Niel Hummer, NP  paliperidone (INVEGA SUSTENNA) 156 MG/ML SUSP injection Inject 1 mL (156 mg total) into the muscle every 30 (thirty) days. Patient not taking: Reported on 12/03/2016 12/28/15   Niel Hummer, NP  paliperidone (INVEGA SUSTENNA) 234 MG/1.5ML SUSP injection Inject 234 mg into the  muscle every 30 (thirty) days.    [provider]  paliperidone (INVEGA) 6 MG 24 hr tablet Take 6 mg by mouth daily.    [provider]  potassium chloride (K-DUR,KLOR-CON) 10 MEQ tablet Take 1 tablet (10 mEq total) by mouth daily. Patient not taking: Reported on 03/03/2019 12/28/15   Niel Hummer, NP  QUEtiapine (SEROQUEL) 100 MG tablet Take 100 mg by mouth at bedtime.    [provider]  risperiDONE (RISPERDAL) 2 MG tablet Take 1 tablet (2 mg total) by mouth 2 (two) times daily. Patient not taking: Reported on 03/03/2019 12/28/15   Niel Hummer, NP  topiramate (TOPAMAX) 50 MG tablet Take 50 mg by mouth. 09/16/16   [provider]  traZODone (DESYREL) 50 MG tablet Take 50 mg by mouth. 09/16/16   [provider]  Valbenazine Tosylate (INGREZZA) 40 MG CAPS Take 1 capsule by mouth daily.    [provider]         Ina Homes, MD  [facility] PGY3  Pager: [phone]

## 2019-06-28 NOTE — Progress Notes (Addendum)
Initial Nutrition Assessment  DOCUMENTATION CODES:   Obesity unspecified  INTERVENTION:   Initiate Vital High Protein @ 20 ml/hr with 60 ml Prostat TID MVI daily  Provides: 1080 kcal, 132 grams protein, and 401 ml free water.  TF regimen and propofol at current rate providing 2109 total kcal/day    NUTRITION DIAGNOSIS:   Inadequate oral intake related to inability to eat as evidenced by NPO status.  GOAL:   Provide needs based on ASPEN/SCCM guidelines  MONITOR:   TF tolerance, Vent status  REASON FOR ASSESSMENT:   Consult, Ventilator Enteral/tube feeding initiation and management  ASSESSMENT:   Pt with PMH of bipolar 1 disorder, HTN, obesity, sxhizo-affective schizophrenia, sz, and substance abuse admitted 4/15 for angioedema and had emergent trach placed.   4/16 cortrak placed, tip gastric   Patient is currently intubated on ventilator support MV: 8.6 L/min Temp (24hrs), Avg:98.9 F (37.2 C), Min:98.5 F (36.9 C), Max:99.9 F (37.7 C)  Propofol: 39 ml/hr (50 mcg) provides: 1029 kcal  Medications reviewed and include: solumedrol  Labs reviewed:  CBG's: [phone]    NUTRITION - FOCUSED PHYSICAL EXAM:    Most Recent Value  Orbital Region  No depletion  Upper Arm Region  No depletion  Thoracic and Lumbar Region  No depletion  Buccal Region  No depletion  Temple Region  No depletion  Clavicle Bone Region  No depletion  Clavicle and Acromion Bone Region  No depletion  Scapular Bone Region  No depletion  Dorsal Hand  No depletion  Patellar Region  No depletion  Anterior Thigh Region  No depletion  Posterior Calf Region  No depletion  Edema (RD Assessment)  Moderate  Hair  Reviewed  Eyes  Unable to assess  Mouth  Unable to assess  Skin  Reviewed  Nails  Reviewed       Diet Order:   Diet Order            Diet NPO time specified  Diet effective now              EDUCATION NEEDS:   No education needs have been identified at this  time  Skin:  Skin Assessment: Reviewed RN Assessment  Last BM:  unknown  Height:   Ht Readings from Last 1 Encounters:  06/21/19 5\' 7"  (1.702 m)    Weight:   Wt Readings from Last 1 Encounters:  06/28/19 115.8 kg    Ideal Body Weight:  61.3 kg  BMI:  Body mass index is 39.98 kg/m.  Estimated Nutritional Needs:   Kcal:  06/30/19  Protein:  122-153 grams  Fluid:  >1.5 L/day  [id number]., RD, LDN, CNSC See AMiON for contact information

## 2019-06-28 NOTE — Progress Notes (Signed)
eLink Physician-Brief Progress Note Patient Name: [name] DOB: 02-23-1961 MRN: [id number]   Date of Service  06/28/2019  HPI/Events of Note  53F who presented with angioedema (presumed ACEi related) and required emergent tracheostomy in the OR with anesthesia. This procedure was technically very difficult per report. There was some notable bleeding at the site upon completion as the patient started to wake up and move. This improved with increased sedation, and the recommendation was to sedate well overnight to help reduce risk of bleeding recurrence.  The patient is in the ICU on the ventilator via the 8.0 cuffed trach that was just placed. They are sedated with propofol/fentanyl.   eICU Interventions  # Neuro: - Keep well sedated overnight with prop/fent  # Resp: - Difficult trach. Has size 8.0 cuffed trach in place currently. - Monitor for bleeding at trach site. - Consider d/c'ing steroids in AM given likelihood of this being ACEi related angioedema for which steroids play limited role.  # Cardiac: - NAI  # GI: - NPO for now  # Endo: - Check CBG Q4H given IV steroids  DVT PPX: SCDs given risk of rebleeding at trach site. GI PPX: Famotidine. CODE STATUS: Full Code.     Intervention Category Evaluation Type: New Patient Evaluation  Janae Bridgeman 06/28/2019, 2:58 AM

## 2019-06-28 NOTE — ED Provider Notes (Signed)
[facility]ERGENCY DEPARTMENT Provider Note   CSN: [id number] Arrival date & time: 06/27/19  2240     History Chief Complaint  Patient presents with  . Allergic Reaction    [name] is a 59 y.o. female.  Level 5 caveat secondary to acuity of condition.  59 year old female hypertension bipolar.  Here with acute swelling around her face and throat that started sometime earlier today.  She is not sure if she was bitten by an insect.  She is on an ACE inhibitor.  She received Benadryl Solu-Medrol and IM epi with no improvement.  Saturations have been okay.  Tachycardic on arrival.  She is able to speak a couple of words at a time very garbled.  The history is provided by the patient and the EMS personnel.  Allergic Reaction Presenting symptoms: difficulty breathing, difficulty swallowing and swelling   Severity:  Severe Prior allergic episodes:  No prior episodes Relieved by:  Nothing Worsened by:  Nothing Ineffective treatments:  Antihistamines, epinephrine and steroids      Past Medical History:  Diagnosis Date  . Arthritis   . Bipolar 1 disorder ([place])   . Hypertension   . Obesity   . Psychiatric illness   . Schizo-affective schizophrenia ([place])   . Seizures ([place])   . Substance abuse [facility])     Patient Active Problem List   Diagnosis Date Noted  . Substance induced mood disorder ([place]) 12/27/2015  . Fever, unspecified 09/13/2015  . Leukocytosis 09/13/2015  . Acute encephalopathy 09/13/2015  . Substance abuse ([place]) 09/12/2015  . Hallucination   . Homicidal ideation   . Schizophrenia, unspecified type ([place])   . Schizoaffective disorder, bipolar type ([place])   . Schizoaffective disorder ([place]) 03/29/2013  . Cocaine abuse ([place]) 06/30/2011  . Non-compliant patient 06/29/2011  . Hypertension 06/29/2011  . Alcohol dependence ([place]) 06/28/2011  . Polysubstance dependence ([place]) 04/03/2011  . Psychiatric problem 04/03/2011  . Polysubstance abuse ([place])  06/21/2010  . Schizophrenia ([place]) 06/21/2010    Past Surgical History:  Procedure Laterality Date  . STOMACH SURGERY       OB History   No obstetric history on file.     Family History  Problem Relation Age of Onset  . Alzheimer's disease Mother   . Depression Mother     Social History   Tobacco Use  . Smoking status: Current Every Day Smoker    Packs/day: 1.00    Types: Cigarettes  . Smokeless tobacco: Never Used  Substance Use Topics  . Alcohol use: Yes    Alcohol/week: 6.0 standard drinks    Types: 6 Cans of beer per week  . Drug use: Yes    Frequency: 5.0 times per week    Types: "Crack" cocaine, Cocaine    Comment: crack/cocaine    Home Medications Prior to Admission medications   Medication Sig Start Date End Date Taking? Authorizing Provider  cephALEXin (KEFLEX) 500 MG capsule 1 cap po bid x 7 days 06/21/19   Muthersbaugh, Jarrett Soho, PA-C  divalproex (DEPAKOTE ER) 500 MG 24 hr tablet Take 1 tablet (500 mg total) by mouth 2 (two) times daily. Patient not taking: Reported on 03/03/2019 12/28/15   Niel Hummer, NP  hydrochlorothiazide (MICROZIDE) 12.5 MG capsule Take 1 capsule (12.5 mg total) by mouth daily. Patient not taking: Reported on 03/03/2019 12/16/18   Kendrick, Caitlyn S, PA-C  lisinopril (PRINIVIL,ZESTRIL) 5 MG tablet Take 1 tablet (5 mg total) by mouth daily. Patient not taking: Reported on  03/03/2019 12/28/15   Niel Hummer, NP  paliperidone (INVEGA SUSTENNA) 156 MG/ML SUSP injection Inject 1 mL (156 mg total) into the muscle every 30 (thirty) days. Patient not taking: Reported on 12/03/2016 12/28/15   Niel Hummer, NP  paliperidone (INVEGA SUSTENNA) 234 MG/1.5ML SUSP injection Inject 234 mg into the muscle every 30 (thirty) days.    [provider]  paliperidone (INVEGA) 6 MG 24 hr tablet Take 6 mg by mouth daily.    [provider]  potassium chloride (K-DUR,KLOR-CON) 10 MEQ tablet Take 1 tablet (10 mEq total) by mouth  daily. Patient not taking: Reported on 03/03/2019 12/28/15   Niel Hummer, NP  QUEtiapine (SEROQUEL) 100 MG tablet Take 100 mg by mouth at bedtime.    [provider]  risperiDONE (RISPERDAL) 2 MG tablet Take 1 tablet (2 mg total) by mouth 2 (two) times daily. Patient not taking: Reported on 03/03/2019 12/28/15   Niel Hummer, NP  topiramate (TOPAMAX) 50 MG tablet Take 50 mg by mouth. 09/16/16   [provider]  traZODone (DESYREL) 50 MG tablet Take 50 mg by mouth. 09/16/16   [provider]  Valbenazine Tosylate (INGREZZA) 40 MG CAPS Take 1 capsule by mouth daily.    [provider]    Allergies    Patient has no known allergies.  Review of Systems   Review of Systems  Unable to perform ROS: Acuity of condition  HENT: Positive for trouble swallowing.   Respiratory: Positive for shortness of breath.   Cardiovascular: Negative for chest pain.    Physical Exam Updated Vital Signs BP (!) 144/71   Pulse (!) 137   Temp 99.9 F (37.7 C) (Oral)   Resp (!) 27   LMP 01/13/2012   SpO2 92%   Physical Exam Vitals and nursing note reviewed.  Constitutional:      General: She is not in acute distress.    Appearance: She is well-developed.  HENT:     Head: Normocephalic.     Comments: Marked swelling of upper lip and cheeks.  Tongue appears not particularly enlarged.  When she range of motion to her neck she has a lot of neck pain and her voice gets even more garbled.  Anticipate there is a lot of posterior edema. Eyes:     Conjunctiva/sclera: Conjunctivae normal.  Cardiovascular:     Rate and Rhythm: Regular rhythm. Tachycardia present.     Heart sounds: No murmur.  Pulmonary:     Effort: Pulmonary effort is normal. No respiratory distress.     Breath sounds: Normal breath sounds.  Abdominal:     Palpations: Abdomen is soft.     Tenderness: There is no abdominal tenderness.  Musculoskeletal:        General: No deformity or signs of injury.      Cervical back: Tenderness present.  Skin:    General: Skin is warm and dry.     Capillary Refill: Capillary refill takes less than 2 seconds.  Neurological:     General: No focal deficit present.     Mental Status: She is alert.     ED Results / Procedures / Treatments   Labs (all labs ordered are listed, but only abnormal results are displayed) Labs Reviewed  BASIC METABOLIC PANEL - Abnormal; Notable for the following components:      Result Value   Chloride 97 (*)    Glucose, Bld 132 (*)    Creatinine, Ser 1.01 (*)  All other components within normal limits  CBC WITH DIFFERENTIAL/PLATELET - Abnormal; Notable for the following components:   WBC 10.6 (*)    All other components within normal limits  CBC - Abnormal; Notable for the following components:   WBC 10.9 (*)    All other components within normal limits  BASIC METABOLIC PANEL - Abnormal; Notable for the following components:   Sodium 134 (*)    Glucose, Bld 212 (*)    Creatinine, Ser 1.01 (*)    Calcium 8.6 (*)    All other components within normal limits  RAPID URINE DRUG SCREEN, HOSP PERFORMED - Abnormal; Notable for the following components:   Cocaine POSITIVE (*)    Benzodiazepines POSITIVE (*)    All other components within normal limits  GLUCOSE, CAPILLARY - Abnormal; Notable for the following components:   Glucose-Capillary 205 (*)    All other components within normal limits  C-REACTIVE PROTEIN - Abnormal; Notable for the following components:   CRP 1.0 (*)    All other components within normal limits  SEDIMENTATION RATE - Abnormal; Notable for the following components:   Sed Rate 34 (*)    All other components within normal limits  GLUCOSE, CAPILLARY - Abnormal; Notable for the following components:   Glucose-Capillary 222 (*)    All other components within normal limits  GLUCOSE, CAPILLARY - Abnormal; Notable for the following components:   Glucose-Capillary 281 (*)    All other components within  normal limits  POCT I-STAT 7, (LYTES, BLD GAS, ICA,H+H) - Abnormal; Notable for the following components:   pH, Arterial 7.308 (*)    pCO2 arterial 53.6 (*)    pO2, Arterial 75.0 (*)    Sodium 133 (*)    All other components within normal limits  RESPIRATORY PANEL BY RT PCR (FLU A&B, COVID)  MRSA PCR SCREENING  PROTIME-INR  HIV ANTIBODY (ROUTINE TESTING W REFLEX)  MAGNESIUM  PHOSPHORUS  TRIGLYCERIDES  PROCALCITONIN  C4 COMPLEMENT  BLOOD GAS, ARTERIAL    EKG EKG Interpretation  Date/Time:  Thursday June 27 2019 22:47:27 EDT Ventricular Rate:  104 PR Interval:    QRS Duration: 73 QT Interval:  335 QTC Calculation: 441 R Axis:   57 Text Interpretation: Sinus tachycardia Ventricular premature complex Aberrant conduction of SV complex(es) Nonspecific repol abnormality, diffuse leads faster rate and nonspecific changes compared with prior 9/20 Confirmed by Aletta Edouard [phone]) on 06/28/2019 12:19:37 AM   Radiology DG CHEST PORT 1 VIEW  Result Date: 06/28/2019 CLINICAL DATA:  Tracheostomy EXAM: PORTABLE CHEST 1 VIEW COMPARISON:  08/15/2018 FINDINGS: Interim placement of tracheostomy, tip of the tube is about 5.3 cm superior to the carina and projects at the level of mid clavicles. There are low lung volumes. There is consolidation in the left greater than right lung base. Possible trace right effusion. Enlarged cardiomediastinal silhouette. IMPRESSION: 1. Tracheostomy tube placement as above 2. Low lung volumes with consolidation at the left greater than right lung base, atelectasis versus pneumonia 3. Cardiomegaly Electronically Signed   By: Donavan Foil M.D.   On: 06/28/2019 02:36    Procedures .Critical Care Performed by: Hayden Rasmussen, MD Authorized by: Hayden Rasmussen, MD   Critical care provider statement:    Critical care time (minutes):  80   Critical care time was exclusive of:  Separately billable procedures and treating other patients   Critical care was  necessary to treat or prevent imminent or life-threatening deterioration of the following conditions: angioedema with emergent airway  concerns.   Critical care was time spent personally by me on the following activities:  Discussions with consultants, evaluation of patient's response to treatment, examination of patient, ordering and performing treatments and interventions, ordering and review of laboratory studies, pulse oximetry, re-evaluation of patient's condition, obtaining history from patient or surrogate and review of old charts   I assumed direction of critical care for this patient from another provider in my specialty: no     (including critical care time)  Medications Ordered in ED Medications - No data to display  ED Course  I have reviewed the triage vital signs and the nursing notes.  Pertinent labs & imaging results that were available during my care of the patient were reviewed by me and considered in my medical decision making (see chart for details).  Clinical Course as of Jun 28 1018  Fri Jun 28, 2019  0017 Patient presented with likely angioedema and a very concerning exam for difficult airway.  I consulted Dr. Glennon Mac anesthesia who presented down in the emergency department with to assist in CRNA's.  They tried multiple maneuvers to try to get the patient intubated including fiberoptic.  Cords in all structures around the cords were very edematous and patient had a lot of secretions.  Ultimately I elected to call Dr. Constance Holster ENT who presented to the emergency department very quickly and the decision was made to take her to the operating room for intubation possible tracheostomy   [MB]    Clinical Course User Index [MB] Hayden Rasmussen, MD   MDM Rules/Calculators/A&P                     Patient presents with acute airway issues.  Edema of anterior structures and with her garbled voice likely posterior also.  I was very concerned that she would not be successfully  intubated without extra precautions.  I consulted anesthesia Dr. Glennon Mac, and we worked diligently for about an hour trying to do an awake intubation.  Ultimately I contacted Dr. Constance Holster ENT presented to the emergency department and after evaluation of the patient and discussion with myself and Dr. Glennon Mac elected to take the patient to the operating room in anticipation for needing a tracheostomy.  During the time the patient was in the department I was at her bedside and we had a cricothyrotomy kit ready at the bedside if an emergent surgical airway was needed.  Final Clinical Impression(s) / ED Diagnoses Final diagnoses:  Angioedema, initial encounter  Airway compromise    Rx / DC Orders ED Discharge Orders    None       Hayden Rasmussen, MD 06/28/19 1024

## 2019-06-28 NOTE — Anesthesia Preprocedure Evaluation (Addendum)
Anesthesia Evaluation  Patient identified by MRN, date of birth, ID band Patient awake    Reviewed: Allergy & Precautions, Patient's Chart, lab work & pertinent test resultsPreop documentation limited or incomplete due to emergent nature of procedure.  History of Anesthesia Complications Negative for: history of anesthetic complications  Airway Mallampati: IV  TM Distance: <3 FB   Mouth opening: Limited Mouth Opening Comment: Pt with extreme edema of lips and tongue, neck is short, chin receding, she is struggling to breathe in upright position Dental  (+) Edentulous Upper, Edentulous Lower   Pulmonary Current Smoker,    Pulmonary exam normal        Cardiovascular hypertension, Pt. on medications  Rhythm:Regular Rate:Tachycardia     Neuro/Psych Bipolar Disorder Schizophrenia negative neurological ROS     GI/Hepatic negative GI ROS,   Endo/Other  Morbid obesity  Renal/GU negative Renal ROS     Musculoskeletal   Abdominal (+) + obese,   Peds  Hematology   Anesthesia Other Findings   Reproductive/Obstetrics                            Anesthesia Physical Anesthesia Plan  ASA: III and emergent  Anesthesia Plan: MAC   Post-op Pain Management:    Induction:   PONV Risk Score and Plan: 1 and Treatment may vary due to age or medical condition  Airway Management Planned: Mask, Natural Airway and Tracheostomy  Additional Equipment:   Intra-op Plan:   Post-operative Plan: Possible Post-op intubation/ventilation  Informed Consent: I have reviewed the patients History and Physical, chart, labs and discussed the procedure including the risks, benefits and alternatives for the proposed anesthesia with the patient or authorized representative who has indicated his/her understanding and acceptance.     Only emergency history available  Plan Discussed with: CRNA and Surgeon  Anesthesia  Plan Comments:         Anesthesia Quick Evaluation

## 2019-06-28 NOTE — Code Documentation (Signed)
Pt being sent to OR due to inability to intubate.

## 2019-06-28 NOTE — Progress Notes (Signed)
Called to ED to assist with intubation in 58yo female with presumed angioedema. Pt is  morbidly obese, with h/o HTN, schizophrenia, bipolar, and substance abuse (ETOH, crack most days). Arrived to find pt distressed, with extreme dyspnea, sitting upright, lips and tongue are remarkably edematous.       VSS: HR 130, BP 170/100, RR 35, O2sat in 90's with NCO2.      She has received Benadryl, Steroids and has an 20ga IV in R AC.  Lidocaine jelly to VideoGlide blade, as well as tonsillar pillars: unable to visualize glottis, with extreme post oropharyngeal and glottic edema. Multiple passes with bronchoscope: able to visualize vocal cords, arytenoids and false cords are extremely edematous, unable to enter trachea. Pt is very cooperative and understands gravity of the situation throughout.   Dr Pollyann Kennedy, ENT arrives, agree to proceed to OR for emergent awake trach.   Sandford Craze, MD [facility]

## 2019-06-28 NOTE — H&P (Addendum)
NAME:  [name], MRN:  [id number], DOB:  04-Dec-1960, LOS: 0 ADMISSION DATE:  06/27/2019, CONSULTATION DATE:  4/16 REFERRING MD:  Dr. Melina Copa, CHIEF COMPLAINT:  Angioedema   Brief History   59 year old female presented with angioedema and airway compromise. Unable to be intubated and ED and was taken for emergent tracheostomy.   History of present illness   Patient is encephalopathic and/or intubated. Therefore history has been obtained from chart review. 59 year old female with PMH as below, which is significant for HTN, schizoaffective disorder, bipolar disorder, seizures, and (cocaine) substance abuse. She presented to [facility] ED in the late PM hours of 4/15 with complaints of face and throat swelling. On initial assessment she was able to speak a few garbled words at a time. The decision was made to intubate the patient for airway protection and anesthesia was called for difficult airway. Unfortunately the anesthesiologist was unable to intubate the patient and she was taken emergently to OR for tracheostomy. PCCM asked to admit to ICU.   Past Medical History   has a past medical history of Arthritis, Bipolar 1 disorder ([place]), Hypertension, Obesity, Psychiatric illness, Schizo-affective schizophrenia ([place]), Seizures ([place]), and Substance abuse ([place]).   Significant Hospital Events     Consults:  Anesthesia   Procedures:  Tracheostomy 4/16 >  Significant Diagnostic Tests:    Micro Data:    Antimicrobials:     Interim history/subjective:    Objective   Blood pressure (!) 144/71, pulse (!) 137, temperature 99.9 F (37.7 C), temperature source Oral, resp. rate (!) 27, last menstrual period 01/13/2012, SpO2 92 %.       No intake or output data in the 24 hours ending 06/28/19 0030 There were no vitals filed for this visit.  Examination: General: obese female on vent HENT: [place]/AT, PERRL, no JVD. Lipoma noted on forehead.  Lungs: Rhonchi Cardiovascular: RRR, no MRG  Abdomen: Soft, non-distended Extremities: No acute deformity Neuro: Sedated RASS -3  Resolved Hospital Problem list     Assessment & Plan:   Angioedema, with airway compromise. She is on ACE-I and has a history of cocaine abuse, both of which could be the culprit. Also she has a recent injection site in the R deltoid. Unsure what was injected. Recently started on Keflex as well for UTI . Difficult procedure complicated by bleeding.  - Full vent support overnight - Propofol for sedation RASS goal -1 to -2.  - ENT requesting deeps sedation overnight due to bleeding.  - Hopefully can tolerate SAT and SBT in AM.  - Epi given in ED.  - Pepcid, steroids, benadryl - Check UDS (will have been drawn after OR) - Hold lisinopril - Follow C4, ESR, CRP  Acute hypoxemic respiratory failure: L > R consolidation on CXR. Suspect this is aspirated blood from trach procedure. There was quite a bit of bleeding noted in PACU.  - Vent support as above - Defer ABX for now.   HTN - holding lisinopril  - Keep SBP < 160  Schizoaffective disorder Bipolar disorder - Hold home depakote and topamax, but have not been verified since 2019 and there is question whether or not she is taking these. Hold.  - She has several 2nd gen antipsychotics on her med list. Not sure which is accurate. Will hold for now.   Seizure history - home meds not filled since 2019 per EMR. Could not be verified. Will hold topamax and Depakote as above.   Best practice:  Diet: NPO Pain/Anxiety/Delirium protocol (if indicated): Propofol VAP protocol (if indicated): yes DVT prophylaxis: SCD GI prophylaxis: pepcid Glucose control: NA Mobility: BR Code Status: FULL Family Communication: Has legal guardian in EMR, but telephone number is not correct. Son did not answer.  Disposition: ICU  Labs   CBC: Recent Labs  Lab 06/27/19 2242  WBC 10.6*  NEUTROABS 6.6  HGB 13.8  HCT 43.1  MCV 90.0  PLT 263    Basic Metabolic  Panel: Recent Labs  Lab 06/27/19 2242  NA 136  K 4.2  CL 97*  CO2 26  GLUCOSE 132*  BUN 17  CREATININE 1.01*  CALCIUM 9.1   GFR: Estimated Creatinine Clearance: 86.3 mL/min (A) (by C-G formula based on SCr of 1.01 mg/dL (H)). Recent Labs  Lab 06/27/19 2242  WBC 10.6*    Liver Function Tests: No results for input(s): AST, ALT, ALKPHOS, BILITOT, PROT, ALBUMIN in the last 168 hours. No results for input(s): LIPASE, AMYLASE in the last 168 hours. No results for input(s): AMMONIA in the last 168 hours.  ABG    Component Value Date/Time   PHART 7.400 09/13/2015 1413   PCO2ART 47.7 (H) 09/13/2015 1413   PO2ART 61.5 (L) 09/13/2015 1413   HCO3 28.9 (H) 09/13/2015 1413   TCO2 28 11/18/2018 1256   ACIDBASEDEF 1.5 01/30/2008 0608   O2SAT 88.5 09/13/2015 1413     Coagulation Profile: Recent Labs  Lab 06/27/19 2242  INR 1.0    Cardiac Enzymes: No results for input(s): CKTOTAL, CKMB, CKMBINDEX, TROPONINI in the last 168 hours.  HbA1C: Hgb A1c MFr Bld  Date/Time Value Ref Range Status  06/21/2010 03:52 PM (H) <5.7 % Final   5.9 (NOTE)                                                                       According to the ADA Clinical Practice Recommendations for 2011, when HbA1c is used as a screening test:   >=6.5%   Diagnostic of Diabetes Mellitus           (if abnormal result  is confirmed)  5.7-6.4%   Increased risk of developing Diabetes Mellitus  References:Diagnosis and Classification of Diabetes Mellitus,Diabetes ZCHY,8502,77(AJOIN 1):S62-S69 and Standards of Medical Care in         Diabetes - 2011,Diabetes OMVE,7209,47  (Suppl 1):S11-S61.    CBG: No results for input(s): GLUCAP in the last 168 hours.  Review of Systems:   Unable as patient is encephalopathic and intubated.   Past Medical History  She,  has a past medical history of Arthritis, Bipolar 1 disorder ([place]), Hypertension, Obesity, Psychiatric illness, Schizo-affective schizophrenia ([place]), Seizures  ([place]), and Substance abuse ([place]).   Surgical History    Past Surgical History:  Procedure Laterality Date  . STOMACH SURGERY       Social History   reports that she has been smoking cigarettes. She has been smoking about 1.00 pack per day. She has never used smokeless tobacco. She reports current alcohol use of about 6.0 standard drinks of alcohol per week. She reports current drug use. Frequency: 5.00 times per week. Drugs: "Crack" cocaine and Cocaine.   Family History   Her family history includes Alzheimer's disease in her mother; Depression in  her mother.   Allergies No Known Allergies   Home Medications  Prior to Admission medications   Medication Sig Start Date End Date Taking? Authorizing Provider  cephALEXin (KEFLEX) 500 MG capsule 1 cap po bid x 7 days 06/21/19   Muthersbaugh, Jarrett Soho, PA-C  divalproex (DEPAKOTE ER) 500 MG 24 hr tablet Take 1 tablet (500 mg total) by mouth 2 (two) times daily. Patient not taking: Reported on 03/03/2019 12/28/15   Niel Hummer, NP  hydrochlorothiazide (MICROZIDE) 12.5 MG capsule Take 1 capsule (12.5 mg total) by mouth daily. Patient not taking: Reported on 03/03/2019 12/16/18   Kendrick, Caitlyn S, PA-C  lisinopril (PRINIVIL,ZESTRIL) 5 MG tablet Take 1 tablet (5 mg total) by mouth daily. Patient not taking: Reported on 03/03/2019 12/28/15   Niel Hummer, NP  paliperidone (INVEGA SUSTENNA) 156 MG/ML SUSP injection Inject 1 mL (156 mg total) into the muscle every 30 (thirty) days. Patient not taking: Reported on 12/03/2016 12/28/15   Niel Hummer, NP  paliperidone (INVEGA SUSTENNA) 234 MG/1.5ML SUSP injection Inject 234 mg into the muscle every 30 (thirty) days.    [provider]  paliperidone (INVEGA) 6 MG 24 hr tablet Take 6 mg by mouth daily.    [provider]  potassium chloride (K-DUR,KLOR-CON) 10 MEQ tablet Take 1 tablet (10 mEq total) by mouth daily. Patient not taking: Reported on 03/03/2019 12/28/15   Niel Hummer, NP  QUEtiapine (SEROQUEL) 100 MG tablet Take 100 mg by mouth at bedtime.    [provider]  risperiDONE (RISPERDAL) 2 MG tablet Take 1 tablet (2 mg total) by mouth 2 (two) times daily. Patient not taking: Reported on 03/03/2019 12/28/15   Niel Hummer, NP  topiramate (TOPAMAX) 50 MG tablet Take 50 mg by mouth. 09/16/16   [provider]  traZODone (DESYREL) 50 MG tablet Take 50 mg by mouth. 09/16/16   [provider]  Valbenazine Tosylate (INGREZZA) 40 MG CAPS Take 1 capsule by mouth daily.    [provider]     Critical care time: 35 min     Georgann Housekeeper, AGACNP-BC [place] for personal pager PCCM on call pager [phone]  06/28/2019 2:27 AM

## 2019-06-28 NOTE — Transfer of Care (Signed)
Immediate Anesthesia Transfer of Care Note  Patient: [name]  Procedure(s) Performed: TRACHEOSTOMY - 8DCT (N/A Neck)  Patient Location: ICU  Anesthesia Type:General  Level of Consciousness: Patient remains intubated per anesthesia plan  Airway & Oxygen Therapy: Patient remains intubated per anesthesia plan and Patient placed on Ventilator (see vital sign flow sheet for setting)  Post-op Assessment: Report given to RN and Post -op Vital signs reviewed and stable  Post vital signs: Reviewed and stable  Last Vitals:  Vitals Value Taken Time  BP 108/67   Temp    Pulse 104   Resp 16   SpO2 95     Last Pain:  Vitals:   06/27/19 2245  TempSrc:   PainSc: 10-Worst pain ever      Report to [facility] RN in 20M ICU. Jean Rosenthal MD and Pollyann Kennedy MD at bedside, VSS. RT at bedside, applied to ventilator, volume control, 100% FiO2, full report given, propofol infusion maintained for sedation, VSS. Transfer of care of patient in stable position.     Complications: No apparent anesthesia complications

## 2019-06-28 NOTE — Code Documentation (Signed)
Anesthesiologist attending at bedside.

## 2019-06-28 NOTE — Procedures (Signed)
Cortrak  Person Inserting Tube:  Makana Feigel, RD Tube Type:  Cortrak - 43 inches Tube Location:  Left nare Initial Placement:  Stomach Secured by: Bridle Technique Used to Measure Tube Placement:  Documented cm marking at nare/ corner of mouth Cortrak Secured At:  78 cm   No x-ray is required. RN may begin using tube.   If the tube becomes dislodged please keep the tube and contact the Cortrak team at [email or web address] (password [email or web address]) for replacement.  If after hours and replacement cannot be delayed, place a NG tube and confirm placement with an abdominal x-ray.   Lashun Ramseyer RD, LDN Clinical Nutrition Pager listed in AMION   

## 2019-06-28 NOTE — Op Note (Signed)
06/28/2019 1:17 AM   PATIENT:  [name], 59 y.o. female  PRE-OPERATIVE DIAGNOSIS:  difficult airway  POST-OPERATIVE DIAGNOSIS:  Difficult airway   PROCEDURE:  Procedure(s): TRACHEOSTOMY, emergency  SURGEON:  Surgeon(s): Susy Frizzle, MD  ASSISTANTS: none   ANESTHESIA:   general, and local  EBL: 50 cc  DRAINS: none   LOCAL MEDICATIONS USED: Half percent Xylocaine with 1: 200,000 epinephrine, and 1% Xylocaine with 1: 200,000 epinephrine  COUNTS CORRECT:  YES  PROCEDURE DETAILS: Patient was taken to the operating room and placed on the operating table in the semi-supine position. A shoulder roll was not initially placed for positioning.  The neck was prepped and draped in a standard fashion. A vertical incision was created just above the sternal notch using electrocautery. The midline fascia was divided.  Due to the extreme anatomy it was very difficult to identify the surgical landmarks.  A large anterior jugular vein was ligated between clamps and divided.  2-0 silk suture ligatures were used.  Ultimately the airway was identified.  I believe I made a tracheostomy incision just below the cricoid in a transverse fashion.  I was able to dilate open the airway and initially placed a #6 endotracheal tube.  This allowed [place] to fully anesthetize the patient under general anesthesia and to proceed with the case.  At that point I was able to replace the endotracheal tube with a #8 Shiley cuffed tracheostomy tube.  The tube was secured in place with nylon sutures around the shield and Velcro straps.  After the patient started to awake from anesthesia and started coughing there was significant bleeding from the tracheostomy site.  She was placed back under general anesthesia.  The trach sutures were removed.  The dressing was removed under the shield and the tracheostomy site was inspected.  There was no further bleeding and I was not able to identify bleeding site.  A large piece of Surgicel  was packed into the periphery of the wound.  New trach straps were placed and 2 nylon sutures were used to secure the shield in place.  There was no further bleeding.  At this point we decided to transfer the patient directly to intensive care unit sedated on a ventilator to avoid any further bleeding.   PLAN OF CARE: Transfer to ICU  PATIENT DISPOSITION:  ICU - hemodynamically stable.

## 2019-06-28 NOTE — Anesthesia Postprocedure Evaluation (Signed)
Anesthesia Post Note  Patient: [name]  Procedure(s) Performed: TRACHEOSTOMY - 8DCT (N/A Neck)     Patient location during evaluation: ICU Anesthesia Type: MAC and General Level of consciousness: sedated and patient remains intubated per anesthesia plan Pain management: pain level controlled Vital Signs Assessment: post-procedure vital signs reviewed and stable Respiratory status: patient on ventilator - see flowsheet for VS and patient remains intubated per anesthesia plan Cardiovascular status: blood pressure returned to baseline and stable Postop Assessment: no apparent nausea or vomiting Anesthetic complications: no Comments: Pt required awake trach, then GA induced for revision. Dr Constance Holster requests pt to be sedated and mechanically ventilated for 24 hours.    Last Vitals:  Vitals:   06/27/19 2355 06/27/19 2357  BP: 131/77 (!) 144/71  Pulse: (!) 133 (!) 137  Resp: (!) 30 (!) 27  Temp:    SpO2: 97% 92%    Last Pain:  Vitals:   06/27/19 2245  TempSrc:   PainSc: 10-Worst pain ever                 Lizzete Gough,E. Kristyn Obyrne

## 2019-06-28 NOTE — Consult Note (Signed)
  Consultation note:  Patient admitted to the emergency department at approximately 10:00 PM with swelling of the lips and tongue.  History of ACE inhibitor use.  Multiple attempts and a securing a safe airway emergency department including anesthesia attempting to intubate by mouth unsuccessful.  Called in for assistance with surgical airway.  Patient is semisitting and maintaining saturations moving air reasonably well but severe swelling of the lips and tongue.  Patient is morbidly obese.  Neck is very short.  Landmarks are very difficult to palpate.  Anesthesia was in the process of examining the airway with the glide scope and able to identify the vocal cord but unable to pass endotracheal tube due to the severe swelling of the supraglottic larynx.  Rapid decision made to bring the patient directly to the operating room for emergency tracheostomy.  This was explained to the patient very briefly but due to the emergency nature the discussion was very limited.

## 2019-06-29 ENCOUNTER — Inpatient Hospital Stay (HOSPITAL_COMMUNITY): Payer: Medicare Other

## 2019-06-29 DIAGNOSIS — J9601 Acute respiratory failure with hypoxia: Secondary | ICD-10-CM | POA: Diagnosis not present

## 2019-06-29 DIAGNOSIS — Z930 Tracheostomy status: Secondary | ICD-10-CM | POA: Diagnosis not present

## 2019-06-29 DIAGNOSIS — T783XXA Angioneurotic edema, initial encounter: Secondary | ICD-10-CM | POA: Diagnosis not present

## 2019-06-29 LAB — PROCALCITONIN: Procalcitonin: <0.1 ng/mL

## 2019-06-29 LAB — TRIGLYCERIDES: Triglycerides: 125 mg/dL (ref <150)

## 2019-06-29 LAB — GLUCOSE, CAPILLARY
Glucose-Capillary: 193 mg/dL — ABNORMAL HIGH (ref 70–99)
Glucose-Capillary: 199 mg/dL — ABNORMAL HIGH (ref 70–99)
Glucose-Capillary: 218 mg/dL — ABNORMAL HIGH (ref 70–99)
Glucose-Capillary: 219 mg/dL — ABNORMAL HIGH (ref 70–99)
Glucose-Capillary: 226 mg/dL — ABNORMAL HIGH (ref 70–99)
Glucose-Capillary: 95 mg/dL (ref 70–99)

## 2019-06-29 LAB — CBC
HCT: 38.3 % (ref 36.0–46.0)
Hemoglobin: 12.4 g/dL (ref 12.0–15.0)
MCH: 28.7 pg (ref 26.0–34.0)
MCHC: 32.4 g/dL (ref 30.0–36.0)
MCV: 88.7 fL (ref 80.0–100.0)
Platelets: 212 10*3/uL (ref 150–400)
RBC: 4.32 MIL/uL (ref 3.87–5.11)
RDW: 14.3 % (ref 11.5–15.5)
WBC: 8 10*3/uL (ref 4.0–10.5)
nRBC: 0 % (ref 0.0–0.2)

## 2019-06-29 LAB — BASIC METABOLIC PANEL
Anion gap: 10 (ref 5–15)
BUN: 25 mg/dL — ABNORMAL HIGH (ref 6–20)
CO2: 26 mmol/L (ref 22–32)
Calcium: 8.9 mg/dL (ref 8.9–10.3)
Chloride: 99 mmol/L (ref 98–111)
Creatinine, Ser: 1.11 mg/dL — ABNORMAL HIGH (ref 0.44–1.00)
GFR calc Af Amer: >60 mL/min (ref >60)
GFR calc non Af Amer: 55 mL/min — ABNORMAL LOW (ref >60)
Glucose, Bld: 228 mg/dL — ABNORMAL HIGH (ref 70–99)
Potassium: 4.6 mmol/L (ref 3.5–5.1)
Sodium: 135 mmol/L (ref 135–145)

## 2019-06-29 LAB — MAGNESIUM: Magnesium: 2.3 mg/dL (ref 1.7–2.4)

## 2019-06-29 LAB — PHOSPHORUS: Phosphorus: 3.8 mg/dL (ref 2.5–4.6)

## 2019-06-29 MED ORDER — DEXMEDETOMIDINE HCL IN NACL 400 MCG/100ML IV SOLN
0.4000 ug/kg/h | INTRAVENOUS | Status: DC
  Administered 2019-06-29: 21:00:00 1.2 ug/kg/h via INTRAVENOUS
  Administered 2019-06-29: 11:00:00 0.4 ug/kg/h via INTRAVENOUS
  Administered 2019-06-29 – 2019-06-30 (×9): 1.2 ug/kg/h via INTRAVENOUS
  Administered 2019-07-01: 1 ug/kg/h via INTRAVENOUS
  Administered 2019-07-01 (×5): 1.2 ug/kg/h via INTRAVENOUS
  Administered 2019-07-02: 12:00:00 0.4 ug/kg/h via INTRAVENOUS
  Administered 2019-07-02: 22:00:00 0.6 ug/kg/h via INTRAVENOUS
  Administered 2019-07-02: 1.1 ug/kg/h via INTRAVENOUS
  Administered 2019-07-02: 1 ug/kg/h via INTRAVENOUS
  Administered 2019-07-03: 06:00:00 0.4 ug/kg/h via INTRAVENOUS
  Filled 2019-06-29 (×22): qty 100

## 2019-06-29 MED ORDER — INSULIN ASPART 100 UNIT/ML ~~LOC~~ SOLN
6.0000 [IU] | SUBCUTANEOUS | Status: DC
  Administered 2019-06-29 – 2019-07-09 (×49): 6 [IU] via SUBCUTANEOUS

## 2019-06-29 MED ORDER — FUROSEMIDE 10 MG/ML IJ SOLN
40.0000 mg | Freq: Once | INTRAMUSCULAR | Status: AC
  Administered 2019-06-29: 16:00:00 40 mg via INTRAVENOUS
  Filled 2019-06-29: qty 4

## 2019-06-29 MED ORDER — ENOXAPARIN SODIUM 40 MG/0.4ML ~~LOC~~ SOLN
40.0000 mg | SUBCUTANEOUS | Status: DC
  Administered 2019-06-29 – 2019-07-19 (×15): 40 mg via SUBCUTANEOUS
  Filled 2019-06-29 (×18): qty 0.4

## 2019-06-29 MED ORDER — HALOPERIDOL LACTATE 5 MG/ML IJ SOLN
2.0000 mg | Freq: Four times a day (QID) | INTRAMUSCULAR | Status: DC | PRN
  Administered 2019-06-29 – 2019-07-09 (×4): 2 mg via INTRAVENOUS
  Filled 2019-06-29 (×5): qty 1

## 2019-06-29 NOTE — Plan of Care (Signed)
  Problem: Clinical Measurements: Goal: Will remain free from infection Outcome: Progressing Goal: Diagnostic test results will improve Outcome: Progressing Goal: Respiratory complications will improve Outcome: Progressing Goal: Cardiovascular complication will be avoided Outcome: Progressing   Problem: Nutrition: Goal: Adequate nutrition will be maintained Outcome: Progressing   Problem: Safety: Goal: Ability to remain free from injury will improve Outcome: Progressing   Problem: Skin Integrity: Goal: Risk for impaired skin integrity will decrease Outcome: Progressing   Problem: Elimination: Goal: Will not experience complications related to urinary retention Outcome: Not Progressing  Pt has been retaining urine, pt had to be straight cathed to relieve urinary retention.

## 2019-06-29 NOTE — Progress Notes (Addendum)
NAME:  [name], MRN:  [id number], DOB:  08-24-1960, LOS: 1 ADMISSION DATE:  06/27/2019, CONSULTATION DATE:  4/16 REFERRING MD:  Dr. Melina Copa, CHIEF COMPLAINT:  Angioedema   Brief History   59 year old female presented with angioedema and airway compromise. Unable to be intubated and ED and was taken for emergent tracheostomy.   History of present illness   Patient is encephalopathic and/or intubated. Therefore history has been obtained from chart review. 59 year old female with PMH as below, which is significant for HTN, schizoaffective disorder, bipolar disorder, seizures, and (cocaine) substance abuse. She presented to [facility] ED in the late PM hours of 4/15 with complaints of face and throat swelling. On initial assessment she was able to speak a few garbled words at a time. The decision was made to intubate the patient for airway protection and anesthesia was called for difficult airway. Unfortunately the anesthesiologist was unable to intubate the patient and she was taken emergently to OR for tracheostomy. PCCM asked to admit to ICU.   Past Medical History   has a past medical history of Arthritis, Bipolar 1 disorder ([place]), Hypertension, Obesity, Psychiatric illness, Schizo-affective schizophrenia ([place]), Seizures ([place]), and Substance abuse ([place]).  Significant Hospital Events   4/15: Admitted for angioedema and taken for emergent tracheostomy   Consults:  Anesthesia ENT  Procedures:  Tracheostomy 4/16 >  Significant Diagnostic Tests:  None  Micro Data:  4/15: SARs COVID Negative  4/16: MRSA PCR Negative  Antimicrobials:   None  Interim history/subjective:  Remains sedated with Propofol 50 mcg/hr. No events overnight.  Objective   Blood pressure 126/77, pulse 65, temperature 97.7 F (36.5 C), temperature source Oral, resp. rate 18, weight 115.8 kg, last menstrual period 01/13/2012, SpO2 97 %.    Vent Mode: PRVC FiO2 (%):  [50 %-70 %] 50 % Set Rate:  [18 bmp] 18  bmp Vt Set:  [490 mL] 490 mL PEEP:  [10 LKJ17-91 cmH20] 10 cmH20 Plateau Pressure:  [24 cmH20-27 cmH20] 25 cmH20   Intake/Output Summary (Last 24 hours) at 06/29/2019 0813 Last data filed at 06/29/2019 0600 Gross per 24 hour  Intake 2352.53 ml  Output 2750 ml  Net -397.47 ml   Filed Weights   06/28/19 0247  Weight: 115.8 kg    Examination: General: obese female on vent HENT: [place]/AT, Angioedema  Lungs: Mechanical breath sounds bilaterally Cardiovascular: RRR, no MRG Abdomen: Soft, non-distended Extremities: No acute deformity, no edema Neuro: Sedated, RASS -2  Resolved Hospital Problem list     Assessment & Plan:   Acute hypoxemic respiratory failure Angioedema, with airway compromise. ACE-inhibitor vs cocaine use (UDS +). - ESR WNL CRP mildly elevated, can be seen in bradykinin induce angioedema. - Follow-up C4 (to determine if C1 inhibitor lab needed) - Continue famotidine and benadryl - D/C solumedrol as likely 2/2 ACE-i - Lisinopril added to allergy list  - Deep sedation per ENT request  due to bleeding. RASS -1 to -2 with propofol. Will coordinate with ENT for sedation wean.  - Remains on MV, FiO2 weaned to 50%, PEEP of 10. Will wean as tolerated.  - CXR with improving L>R consolidation. Likely 2/2 aspiration of blood during emergent trach. ProCal <0.10, WBC WNL. No Abx  Hyperglycemia: - No Hx of Diabetes, A1c - Start TF coverage - SSI  HTN - Holding lisinopril   Schizoaffective disorder Bipolar disorder Seizure history - Unclear which home medications she is taking - [facility] D/C July 2020 was taking: Cogentin  0.'5mg'$ , Quetiapine '100mg'$ , Depakote '500mg'$ , Topiramate '50mg'$ , and Invega sustenna (injection) - Plan to resume some home medications when able  Best practice:  Diet: NPO Pain/Anxiety/Delirium protocol (if indicated): Propofol VAP protocol (if indicated): yes DVT prophylaxis: SCD GI prophylaxis: pepcid Glucose control: NA Mobility: BR Code Status:  FULL Family Communication: Will try to contact family Disposition: ICU  Labs   CBC: Recent Labs  Lab 06/27/19 2242 06/28/19 0311 06/28/19 0330 06/29/19 0403  WBC 10.6*  --  10.9* 8.0  NEUTROABS 6.6  --   --   --   HGB 13.8 12.9 12.6 12.4  HCT 43.1 38.0 39.0 38.3  MCV 90.0  --  89.7 88.7  PLT 289  --  257 335    Basic Metabolic Panel: Recent Labs  Lab 06/27/19 2242 06/28/19 0311 06/28/19 0330 06/29/19 0403  NA 136 133* 134* 135  K 4.2 4.5 4.6 4.6  CL 97*  --  98 99  CO2 26  --  24 26  GLUCOSE 132*  --  212* 228*  BUN 17  --  17 25*  CREATININE 1.01*  --  1.01* 1.11*  CALCIUM 9.1  --  8.6* 8.9  MG  --   --  2.0 2.3  PHOS  --   --  4.6 3.8   GFR: Estimated Creatinine Clearance: 73.5 mL/min (A) (by C-G formula based on SCr of 1.11 mg/dL (H)). Recent Labs  Lab 06/27/19 2242 06/28/19 0330 06/29/19 0403  PROCALCITON  --  <0.10  --   WBC 10.6* 10.9* 8.0    Liver Function Tests: No results for input(s): AST, ALT, ALKPHOS, BILITOT, PROT, ALBUMIN in the last 168 hours. No results for input(s): LIPASE, AMYLASE in the last 168 hours. No results for input(s): AMMONIA in the last 168 hours.  ABG    Component Value Date/Time   PHART 7.308 (L) 06/28/2019 0311   PCO2ART 53.6 (H) 06/28/2019 0311   PO2ART 75.0 (L) 06/28/2019 0311   HCO3 26.8 06/28/2019 0311   TCO2 28 06/28/2019 0311   ACIDBASEDEF 1.5 01/30/2008 0608   O2SAT 93.0 06/28/2019 0311     Coagulation Profile: Recent Labs  Lab 06/27/19 2242  INR 1.0    Cardiac Enzymes: No results for input(s): CKTOTAL, CKMB, CKMBINDEX, TROPONINI in the last 168 hours.  HbA1C: Hgb A1c MFr Bld  Date/Time Value Ref Range Status  06/21/2010 03:52 PM (H) <5.7 % Final   5.9 (NOTE)                                                                       According to the ADA Clinical Practice Recommendations for 2011, when HbA1c is used as a screening test:   >=6.5%   Diagnostic of Diabetes Mellitus           (if abnormal  result  is confirmed)  5.7-6.4%   Increased risk of developing Diabetes Mellitus  References:Diagnosis and Classification of Diabetes Mellitus,Diabetes KTGY,5638,93(TDSKA 1):S62-S69 and Standards of Medical Care in         Diabetes - 2011,Diabetes [id number],1572,62  (Suppl 1):S11-S61.    CBG: Recent Labs  Lab 06/28/19 1554 06/28/19 1953 06/29/19 0003 06/29/19 0358 06/29/19 0736  GLUCAP 196* 209* 218* 219* 226*    Review  of Systems:   Unable as patient is encephalopathic and intubated.   Past Medical History  She,  has a past medical history of Arthritis, Bipolar 1 disorder ([place]), Hypertension, Obesity, Psychiatric illness, Schizo-affective schizophrenia ([place]), Seizures ([place]), and Substance abuse ([place]).   Surgical History    Past Surgical History:  Procedure Laterality Date  . STOMACH SURGERY       Social History   reports that she has been smoking cigarettes. She has been smoking about 1.00 pack per day. She has never used smokeless tobacco. She reports current alcohol use of about 6.0 standard drinks of alcohol per week. She reports current drug use. Frequency: 5.00 times per week. Drugs: "Crack" cocaine and Cocaine.   Family History   Her family history includes Alzheimer's disease in her mother; Depression in her mother.   Allergies Allergies  Allergen Reactions  . Lisinopril Swelling    Angioedema resulting in emergent trach     Home Medications  Prior to Admission medications   Medication Sig Start Date End Date Taking? Authorizing Provider  cephALEXin (KEFLEX) 500 MG capsule 1 cap po bid x 7 days 06/21/19   Muthersbaugh, Jarrett Soho, PA-C  divalproex (DEPAKOTE ER) 500 MG 24 hr tablet Take 1 tablet (500 mg total) by mouth 2 (two) times daily. Patient not taking: Reported on 03/03/2019 12/28/15   Niel Hummer, NP  hydrochlorothiazide (MICROZIDE) 12.5 MG capsule Take 1 capsule (12.5 mg total) by mouth daily. Patient not taking: Reported on 03/03/2019 12/16/18   Kendrick,  Caitlyn S, PA-C  lisinopril (PRINIVIL,ZESTRIL) 5 MG tablet Take 1 tablet (5 mg total) by mouth daily. Patient not taking: Reported on 03/03/2019 12/28/15   Niel Hummer, NP  paliperidone (INVEGA SUSTENNA) 156 MG/ML SUSP injection Inject 1 mL (156 mg total) into the muscle every 30 (thirty) days. Patient not taking: Reported on 12/03/2016 12/28/15   Niel Hummer, NP  paliperidone (INVEGA SUSTENNA) 234 MG/1.5ML SUSP injection Inject 234 mg into the muscle every 30 (thirty) days.    [provider]  paliperidone (INVEGA) 6 MG 24 hr tablet Take 6 mg by mouth daily.    [provider]  potassium chloride (K-DUR,KLOR-CON) 10 MEQ tablet Take 1 tablet (10 mEq total) by mouth daily. Patient not taking: Reported on 03/03/2019 12/28/15   Niel Hummer, NP  QUEtiapine (SEROQUEL) 100 MG tablet Take 100 mg by mouth at bedtime.    [provider]  risperiDONE (RISPERDAL) 2 MG tablet Take 1 tablet (2 mg total) by mouth 2 (two) times daily. Patient not taking: Reported on 03/03/2019 12/28/15   Niel Hummer, NP  topiramate (TOPAMAX) 50 MG tablet Take 50 mg by mouth. 09/16/16   [provider]  traZODone (DESYREL) 50 MG tablet Take 50 mg by mouth. 09/16/16   [provider]  Valbenazine Tosylate (INGREZZA) 40 MG CAPS Take 1 capsule by mouth daily.    [provider]        Pearson Grippe, DO IM PGY-3 Pager: [phone]

## 2019-06-30 DIAGNOSIS — J9601 Acute respiratory failure with hypoxia: Secondary | ICD-10-CM | POA: Diagnosis not present

## 2019-06-30 DIAGNOSIS — Z930 Tracheostomy status: Secondary | ICD-10-CM | POA: Diagnosis not present

## 2019-06-30 DIAGNOSIS — T783XXA Angioneurotic edema, initial encounter: Secondary | ICD-10-CM | POA: Diagnosis not present

## 2019-06-30 LAB — CBC
HCT: 43.5 % (ref 36.0–46.0)
Hemoglobin: 13.9 g/dL (ref 12.0–15.0)
MCH: 28.4 pg (ref 26.0–34.0)
MCHC: 32 g/dL (ref 30.0–36.0)
MCV: 89 fL (ref 80.0–100.0)
Platelets: 231 10*3/uL (ref 150–400)
RBC: 4.89 MIL/uL (ref 3.87–5.11)
RDW: 14.4 % (ref 11.5–15.5)
WBC: 11.8 10*3/uL — ABNORMAL HIGH (ref 4.0–10.5)
nRBC: 0 % (ref 0.0–0.2)

## 2019-06-30 LAB — PHOSPHORUS: Phosphorus: 3.3 mg/dL (ref 2.5–4.6)

## 2019-06-30 LAB — HEMOGLOBIN A1C
Hgb A1c MFr Bld: 7.8 % — ABNORMAL HIGH (ref 4.8–5.6)
Mean Plasma Glucose: 177.16 mg/dL

## 2019-06-30 LAB — GLUCOSE, CAPILLARY
Glucose-Capillary: 106 mg/dL — ABNORMAL HIGH (ref 70–99)
Glucose-Capillary: 116 mg/dL — ABNORMAL HIGH (ref 70–99)
Glucose-Capillary: 130 mg/dL — ABNORMAL HIGH (ref 70–99)
Glucose-Capillary: 140 mg/dL — ABNORMAL HIGH (ref 70–99)
Glucose-Capillary: 146 mg/dL — ABNORMAL HIGH (ref 70–99)
Glucose-Capillary: 184 mg/dL — ABNORMAL HIGH (ref 70–99)
Glucose-Capillary: 185 mg/dL — ABNORMAL HIGH (ref 70–99)

## 2019-06-30 LAB — TRIGLYCERIDES: Triglycerides: 152 mg/dL — ABNORMAL HIGH (ref <150)

## 2019-06-30 LAB — BASIC METABOLIC PANEL
Anion gap: 11 (ref 5–15)
BUN: 37 mg/dL — ABNORMAL HIGH (ref 6–20)
CO2: 29 mmol/L (ref 22–32)
Calcium: 9.7 mg/dL (ref 8.9–10.3)
Chloride: 103 mmol/L (ref 98–111)
Creatinine, Ser: 0.82 mg/dL (ref 0.44–1.00)
GFR calc Af Amer: >60 mL/min (ref >60)
GFR calc non Af Amer: >60 mL/min (ref >60)
Glucose, Bld: 111 mg/dL — ABNORMAL HIGH (ref 70–99)
Potassium: 3.9 mmol/L (ref 3.5–5.1)
Sodium: 143 mmol/L (ref 135–145)

## 2019-06-30 LAB — C4 COMPLEMENT: Complement C4, Body Fluid: 42 mg/dL — ABNORMAL HIGH (ref 12–38)

## 2019-06-30 LAB — MAGNESIUM: Magnesium: 2.1 mg/dL (ref 1.7–2.4)

## 2019-06-30 MED ORDER — FUROSEMIDE 10 MG/ML IJ SOLN
40.0000 mg | Freq: Once | INTRAMUSCULAR | Status: AC
  Administered 2019-06-30: 10:00:00 40 mg via INTRAVENOUS
  Filled 2019-06-30: qty 4

## 2019-06-30 MED ORDER — HYDRALAZINE HCL 20 MG/ML IJ SOLN
5.0000 mg | INTRAMUSCULAR | Status: DC | PRN
  Administered 2019-06-30 – 2019-07-01 (×2): 5 mg via INTRAVENOUS
  Filled 2019-06-30 (×2): qty 1

## 2019-06-30 NOTE — Progress Notes (Signed)
Assisted tele visit to patient with family member.  [name] M, RN  

## 2019-06-30 NOTE — Progress Notes (Signed)
eLink Physician-Brief Progress Note Patient Name: Chakita Mcgraw DOB: August 25, 1960 MRN: [id number]   Date of Service  06/30/2019  HPI/Events of Note  SBP > 160 consistently. Cr normal. Allergy angiedema on Vent from ACEI. HR 61.     eICU Interventions  Hydralazine IV ordered     Intervention Category Intermediate Interventions: Hypertension - evaluation and management  Ranee Gosselin 06/30/2019, 9:05 PM

## 2019-06-30 NOTE — Progress Notes (Addendum)
NAME:  [name], MRN:  [id number], DOB:  1961/02/21, LOS: 2 ADMISSION DATE:  06/27/2019, CONSULTATION DATE:  4/16 REFERRING MD:  Dr. Melina Copa, CHIEF COMPLAINT:  Angioedema   Brief History   59 year old female presented with angioedema and airway compromise. Unable to be intubated and ED and was taken for emergent tracheostomy.   History of present illness   Patient is encephalopathic and/or intubated. Therefore history has been obtained from chart review. 59 year old female with PMH as below, which is significant for HTN, schizoaffective disorder, bipolar disorder, seizures, and (cocaine) substance abuse. She presented to [facility] ED in the late PM hours of 4/15 with complaints of face and throat swelling. On initial assessment she was able to speak a few garbled words at a time. The decision was made to intubate the patient for airway protection and anesthesia was called for difficult airway. Unfortunately the anesthesiologist was unable to intubate the patient and she was taken emergently to OR for tracheostomy. PCCM asked to admit to ICU.   Past Medical History   has a past medical history of Arthritis, Bipolar 1 disorder ([place]), Hypertension, Obesity, Psychiatric illness, Schizo-affective schizophrenia ([place]), Seizures ([place]), and Substance abuse ([place]).  Significant Hospital Events   4/15: Admitted for angioedema and taken for emergent tracheostomy   Consults:  Anesthesia ENT  Procedures:  Tracheostomy 4/16 >  Significant Diagnostic Tests:  None  Micro Data:  4/15: SARs COVID Negative  4/16: MRSA PCR Negative  Antimicrobials:  None  Interim history/subjective:  Sedated, but awakens to voice and follows commands. Still significant angioedema. No events overnight.  Objective   Blood pressure 139/66, pulse 67, temperature 97.6 F (36.4 C), temperature source Oral, resp. rate (!) 22, weight 114.4 kg, last menstrual period 01/13/2012, SpO2 92 %.    Vent Mode: PRVC FiO2 (%):   [40 %] 40 % Set Rate:  [18 bmp] 18 bmp Vt Set:  [490 mL] 490 mL PEEP:  [8 cmH20] 8 cmH20 Plateau Pressure:  [20 cmH20-27 cmH20] 27 cmH20   Intake/Output Summary (Last 24 hours) at 06/30/2019 0717 Last data filed at 06/30/2019 0600 Gross per 24 hour  Intake 2374.4 ml  Output 3575 ml  Net -1200.6 ml   Filed Weights   06/28/19 0247 06/30/19 0302  Weight: 115.8 kg 114.4 kg    Examination: General: obese female on vent HENT: [place]/AT, Angioedema  Lungs: Mechanical breath sounds bilaterally Cardiovascular: RRR, no MRG Abdomen: Soft, non-distended Extremities: No acute deformity, no edema Neuro: Sedated, Awakens to voice and follows commands  Resolved Hospital Problem list     Assessment & Plan:   Acute hypoxemic respiratory failure s/p emergent trach Angioedema, with airway compromise. ACE-inhibitor vs cocaine use (UDS +). Lisinopril added to allergy list. - Will need to follow up with ENT to see if/when they plan to re-evaluate - Follow-up C4 (to determine if C1 inhibitor lab needed) - Continue famotidine and benadryl - Weaning deep sedation - Remains on MV, FiO2 weaned to 40%, PEEP of 8. Will wean as tolerated.   Hyperglycemia: Improved - No Hx of Diabetes, A1c - Start TF coverage - SSI  HTN - Holding lisinopril   Schizoaffective disorder Bipolar disorder Seizure history - Unclear which home medications she is taking - [facility] D/[facility] July 2020 was taking: Cogentin 0.5mg , Quetiapine 100mg , Depakote 500mg , Topiramate 50mg , and Invega sustenna (injection) - Pharmacy reports she has only filled Haldol  Best practice:  Diet: NPO Pain/Anxiety/Delirium protocol (if indicated): Propofol VAP protocol (  if indicated): yes DVT prophylaxis: SCD GI prophylaxis: pepcid Glucose control: NA Mobility: BR Code Status: FULL Family Communication: Will try again to contact family. Main contact number has changed per individual who answered the phone. Disposition: ICU  Labs    CBC: Recent Labs  Lab 06/27/19 2242 06/28/19 0311 06/28/19 0330 06/29/19 0403 06/30/19 0303  WBC 10.6*  --  10.9* 8.0 11.8*  NEUTROABS 6.6  --   --   --   --   HGB 13.8 12.9 12.6 12.4 13.9  HCT 43.1 38.0 39.0 38.3 43.5  MCV 90.0  --  89.7 88.7 89.0  PLT 289  --  257 212 231    Basic Metabolic Panel: Recent Labs  Lab 06/27/19 2242 06/28/19 0311 06/28/19 0330 06/29/19 0403 06/30/19 0303  NA 136 133* 134* 135 143  K 4.2 4.5 4.6 4.6 3.9  CL 97*  --  98 99 103  CO2 26  --  24 26 29   GLUCOSE 132*  --  212* 228* 111*  BUN 17  --  17 25* 37*  CREATININE 1.01*  --  1.01* 1.11* 0.82  CALCIUM 9.1  --  8.6* 8.9 9.7  MG  --   --  2.0 2.3 2.1  PHOS  --   --  4.6 3.8 3.3   GFR: Estimated Creatinine Clearance: 98.8 mL/min (by C-G formula based on SCr of 0.82 mg/dL). Recent Labs  Lab 06/27/19 2242 06/28/19 0330 06/29/19 0403 06/30/19 0303  PROCALCITON  --  <0.10 <0.10  --   WBC 10.6* 10.9* 8.0 11.8*    Liver Function Tests: No results for input(s): AST, ALT, ALKPHOS, BILITOT, PROT, ALBUMIN in the last 168 hours. No results for input(s): LIPASE, AMYLASE in the last 168 hours. No results for input(s): AMMONIA in the last 168 hours.  ABG    Component Value Date/Time   PHART 7.308 (L) 06/28/2019 0311   PCO2ART 53.6 (H) 06/28/2019 0311   PO2ART 75.0 (L) 06/28/2019 0311   HCO3 26.8 06/28/2019 0311   TCO2 28 06/28/2019 0311   ACIDBASEDEF 1.5 01/30/2008 0608   O2SAT 93.0 06/28/2019 0311     Coagulation Profile: Recent Labs  Lab 06/27/19 2242  INR 1.0    Cardiac Enzymes: No results for input(s): CKTOTAL, CKMB, CKMBINDEX, TROPONINI in the last 168 hours.  HbA1C: Hgb A1c MFr Bld  Date/Time Value Ref Range Status  06/21/2010 03:52 PM (H) <5.7 % Final   5.9 (NOTE)                                                                       According to the ADA Clinical Practice Recommendations for 2011, when HbA1c is used as a screening test:   >=6.5%   Diagnostic of  Diabetes Mellitus           (if abnormal result  is confirmed)  5.7-6.4%   Increased risk of developing Diabetes Mellitus  References:Diagnosis and Classification of Diabetes Mellitus,Diabetes Care,2011,34(Suppl 1):S62-S69 and Standards of Medical Care in         Diabetes - 2011,Diabetes Care,2011,34  (Suppl 1):S11-S61.    CBG: Recent Labs  Lab 06/29/19 1138 06/29/19 1531 06/29/19 2006 06/30/19 0020 06/30/19 0426  GLUCAP 193* 199* 95 130* 116*  Review of Systems:   Unable as patient is encephalopathic and intubated.   Past Medical History  She,  has a past medical history of Arthritis, Bipolar 1 disorder (HCC), Hypertension, Obesity, Psychiatric illness, Schizo-affective schizophrenia (H[facility]), Seizures (HCC), and Substance abuse (H[facility]).   Surgical History    Past Surgical History:  Procedure Laterality Date  . STOMACH SURGERY       Social History   reports that she has been smoking cigarettes. She has been smoking about 1.00 pack per day. She has never used smokeless tobacco. She reports current alcohol use of about 6.0 standard drinks of alcohol per week. She reports current drug use. Frequency: 5.00 times per week. Drugs: "Crack" cocaine and Cocaine.   Family History   Her family history includes Alzheimer's disease in her mother; Depression in her mother.   Allergies Allergies  Allergen Reactions  . Lisinopril Swelling    Angioedema resulting in emergent trach     Home Medications  Prior to Admission medications   Medication Sig Start Date End Date Taking? Authorizing Provider  cephALEXin (KEFLEX) 500 MG capsule 1 cap po bid x 7 days 06/21/19   Muthersbaugh, Dahlia Client, PA-C  divalproex (DEPAKOTE ER) 500 MG 24 hr tablet Take 1 tablet (500 mg total) by mouth 2 (two) times daily. Patient not taking: Reported on 03/03/2019 12/28/15   Thermon Leyland, NP  hydrochlorothiazide (MICROZIDE) 12.5 MG capsule Take 1 capsule (12.5 mg total) by mouth daily. Patient not taking: Reported  on 03/03/2019 12/16/18   Kendrick, Caitlyn S, PA-C  lisinopril (PRINIVIL,ZESTRIL) 5 MG tablet Take 1 tablet (5 mg total) by mouth daily. Patient not taking: Reported on 03/03/2019 12/28/15   Thermon Leyland, NP  paliperidone (INVEGA SUSTENNA) 156 MG/ML SUSP injection Inject 1 mL (156 mg total) into the muscle every 30 (thirty) days. Patient not taking: Reported on 12/03/2016 12/28/15   Thermon Leyland, NP  paliperidone (INVEGA SUSTENNA) 234 MG/1.5ML SUSP injection Inject 234 mg into the muscle every 30 (thirty) days.    [provider]  paliperidone (INVEGA) 6 MG 24 hr tablet Take 6 mg by mouth daily.    [provider]  potassium chloride (K-DUR,KLOR-CON) 10 MEQ tablet Take 1 tablet (10 mEq total) by mouth daily. Patient not taking: Reported on 03/03/2019 12/28/15   Thermon Leyland, NP  QUEtiapine (SEROQUEL) 100 MG tablet Take 100 mg by mouth at bedtime.    [provider]  risperiDONE (RISPERDAL) 2 MG tablet Take 1 tablet (2 mg total) by mouth 2 (two) times daily. Patient not taking: Reported on 03/03/2019 12/28/15   Thermon Leyland, NP  topiramate (TOPAMAX) 50 MG tablet Take 50 mg by mouth. 09/16/16   [provider]  traZODone (DESYREL) 50 MG tablet Take 50 mg by mouth. 09/16/16   [provider]  Valbenazine Tosylate (INGREZZA) 40 MG CAPS Take 1 capsule by mouth daily.    [provider]        Ginette Otto, DO IM PGY-3 Pager: [phone]

## 2019-07-01 ENCOUNTER — Encounter: Payer: Self-pay | Admitting: *Deleted

## 2019-07-01 DIAGNOSIS — T783XXA Angioneurotic edema, initial encounter: Secondary | ICD-10-CM | POA: Diagnosis not present

## 2019-07-01 DIAGNOSIS — J9601 Acute respiratory failure with hypoxia: Secondary | ICD-10-CM | POA: Diagnosis not present

## 2019-07-01 LAB — BASIC METABOLIC PANEL
Anion gap: 15 (ref 5–15)
BUN: 33 mg/dL — ABNORMAL HIGH (ref 6–20)
CO2: 27 mmol/L (ref 22–32)
Calcium: 9.6 mg/dL (ref 8.9–10.3)
Chloride: 104 mmol/L (ref 98–111)
Creatinine, Ser: 0.78 mg/dL (ref 0.44–1.00)
GFR calc Af Amer: >60 mL/min (ref >60)
GFR calc non Af Amer: >60 mL/min (ref >60)
Glucose, Bld: 137 mg/dL — ABNORMAL HIGH (ref 70–99)
Potassium: 4.2 mmol/L (ref 3.5–5.1)
Sodium: 146 mmol/L — ABNORMAL HIGH (ref 135–145)

## 2019-07-01 LAB — GLUCOSE, CAPILLARY
Glucose-Capillary: 118 mg/dL — ABNORMAL HIGH (ref 70–99)
Glucose-Capillary: 123 mg/dL — ABNORMAL HIGH (ref 70–99)
Glucose-Capillary: 143 mg/dL — ABNORMAL HIGH (ref 70–99)
Glucose-Capillary: 64 mg/dL — ABNORMAL LOW (ref 70–99)
Glucose-Capillary: 126 mg/dL — ABNORMAL HIGH (ref 70–99)
Glucose-Capillary: 170 mg/dL — ABNORMAL HIGH (ref 70–99)
Glucose-Capillary: 166 mg/dL — ABNORMAL HIGH (ref 70–99)

## 2019-07-01 LAB — TRIGLYCERIDES: Triglycerides: 129 mg/dL (ref <150)

## 2019-07-01 LAB — CBC
HCT: 42 % (ref 36.0–46.0)
Hemoglobin: 13.3 g/dL (ref 12.0–15.0)
MCH: 29 pg (ref 26.0–34.0)
MCHC: 31.7 g/dL (ref 30.0–36.0)
MCV: 91.7 fL (ref 80.0–100.0)
Platelets: 226 10*3/uL (ref 150–400)
RBC: 4.58 MIL/uL (ref 3.87–5.11)
RDW: 14.6 % (ref 11.5–15.5)
WBC: 12.2 10*3/uL — ABNORMAL HIGH (ref 4.0–10.5)
nRBC: 0 % (ref 0.0–0.2)

## 2019-07-01 LAB — PHOSPHORUS: Phosphorus: 4.3 mg/dL (ref 2.5–4.6)

## 2019-07-01 LAB — MAGNESIUM: Magnesium: 2.1 mg/dL (ref 1.7–2.4)

## 2019-07-01 MED ORDER — QUETIAPINE FUMARATE 50 MG PO TABS
100.0000 mg | ORAL_TABLET | Freq: Every day | ORAL | Status: DC
  Administered 2019-07-01 – 2019-07-02 (×2): 100 mg via ORAL
  Filled 2019-07-01 (×2): qty 2

## 2019-07-01 MED ORDER — HYDRALAZINE HCL 20 MG/ML IJ SOLN: 5.0000 mg | INTRAMUSCULAR | Status: DC | PRN

## 2019-07-01 MED ORDER — HYDROCHLOROTHIAZIDE 12.5 MG PO CAPS: 12.5000 mg | ORAL_CAPSULE | Freq: Every day | ORAL | Status: DC

## 2019-07-01 MED ORDER — DEXTROSE 50 % IV SOLN
INTRAVENOUS | Status: AC
  Administered 2019-07-01: 25 mL
  Filled 2019-07-01: qty 50

## 2019-07-01 MED ORDER — OXYCODONE-ACETAMINOPHEN 5-325 MG PO TABS: 1.0000 | ORAL_TABLET | Freq: Four times a day (QID) | ORAL | Status: DC | PRN

## 2019-07-01 MED ORDER — CLONAZEPAM 0.5 MG PO TABS
0.5000 mg | ORAL_TABLET | Freq: Three times a day (TID) | ORAL | Status: DC | PRN
  Administered 2019-07-01 – 2019-07-09 (×11): 0.5 mg
  Filled 2019-07-01 (×12): qty 1

## 2019-07-01 MED ORDER — HYDROCHLOROTHIAZIDE 25 MG PO TABS: 25.0000 mg | ORAL_TABLET | Freq: Every day | ORAL | Status: DC

## 2019-07-01 MED ORDER — HYDROCHLOROTHIAZIDE 10 MG/ML ORAL SUSPENSION
12.5000 mg | Freq: Every day | ORAL | Status: DC
  Administered 2019-07-01 – 2019-07-07 (×7): 13 mg
  Filled 2019-07-01 (×10): qty 1.88

## 2019-07-01 MED ORDER — OXYCODONE-ACETAMINOPHEN 5-325 MG PO TABS
1.0000 | ORAL_TABLET | ORAL | Status: DC | PRN
  Administered 2019-07-01: 16:00:00 1 via ORAL
  Filled 2019-07-01 (×3): qty 1

## 2019-07-01 NOTE — Progress Notes (Signed)
Assisted tele visit to patient with daughter.  Thomas, Nya Monds Renee, RN   

## 2019-07-01 NOTE — Progress Notes (Signed)
Pt was placed on trach collar 5L/28%. Pt is tolerating well at this time. RT will continue to monitor.

## 2019-07-01 NOTE — Progress Notes (Signed)
NAME:  [name], MRN:  [id number], DOB:  06-25-1960, LOS: 3 ADMISSION DATE:  06/27/2019, CONSULTATION DATE:  4/16 REFERRING MD:  Dr. Charm Barges, CHIEF COMPLAINT:  Angioedema   Brief History   59 year old female presented with angioedema and airway compromise. Unable to be intubated and ED and was taken for emergent tracheostomy.   History of present illness   Patient is encephalopathic and/or intubated. Therefore history has been obtained from chart review. 59 year old female with PMH as below, which is significant for HTN, schizoaffective disorder, bipolar disorder, seizures, and (cocaine) substance abuse. She presented to [facility] ED in the late PM hours of 4/15 with complaints of face and throat swelling. On initial assessment she was able to speak a few garbled words at a time. The decision was made to intubate the patient for airway protection and anesthesia was called for difficult airway. Unfortunately the anesthesiologist was unable to intubate the patient and she was taken emergently to OR for tracheostomy. PCCM asked to admit to ICU.  Past Medical History   has a past medical history of Arthritis, Bipolar 1 disorder ([facility]), Hypertension, Obesity, Psychiatric illness, Schizo-affective schizophrenia ([facility]), Seizures ([facility]), and Substance abuse ([facility]).  Significant Hospital Events   4/15: Admitted for angioedema and taken for emergent tracheostomy  Consults:  Anesthesia ENT  Procedures:  Tracheostomy 4/16 >  Significant Diagnostic Tests:  none  Micro Data:  4/15: SARs COVID Negative  4/16: MRSA PCR Negative  Antimicrobials:  none   Interim history/subjective:  Patient is resting comfortably in bed with trach collar on. She is sedated and does not appear to be in any distress.   Objective   Blood pressure 136/82, pulse 94, temperature 99.9 F (37.7 C), resp. rate 20, weight 114.7 kg, last menstrual period 01/13/2012, SpO2 97 %.    Vent Mode: PSV;CPAP FiO2 (%):  [28 %-40  %] 28 % Set Rate:  [18 bmp] 18 bmp Vt Set:  [490 mL] 490 mL PEEP:  [5 cmH20-8 cmH20] 5 cmH20 Pressure Support:  [10 cmH20] 10 cmH20 Plateau Pressure:  [19 cmH20-22 cmH20] 19 cmH20   Intake/Output Summary (Last 24 hours) at 07/01/2019 2106 Last data filed at 07/01/2019 1855 Gross per 24 hour  Intake 1426.63 ml  Output 2600 ml  Net -1173.37 ml   Filed Weights   06/28/19 0247 06/30/19 0302 07/01/19 0500  Weight: 115.8 kg 114.4 kg 114.7 kg    Examination: General: obese female on vent HENT: [place]/AT, Angioedema slowly improving Lungs: Mechanical breath sounds bilaterally Cardiovascular: RRR, no MRG Abdomen: Soft, non-distended Extremities: No acute deformity, no edema Neuro: Sedated, Awakens to voice and follows commands  Resolved Hospital Problem list     Assessment & Plan:  Acute hypoxemic respiratory failure s/p emergent trach Angioedema, with airway compromise. ACE-inhibitor vs cocaine use (UDS +). Lisinopril added to allergy list. C4 mildly elevated to 42, will not pursue C1 inhibitor lab. Reports some pain at trach site. - Continue famotidine and benadryl - Weaning Sedation today, speech evaluation. - Restart home medications - Transition to floor today  Hyperglycemia: Improved - No Hx of Diabetes, A1c now 7.8 - TF coverage - SSI  HTN 150/80s - Holding lisinopril - HCTZ 12.5mg  Daily  Schizoaffective disorder Bipolar disorder Seizure history Unclear which home medications she is taking. [facility] D[facility] July 2020 was taking: Cogentin 0.5mg , Quetiapine 100mg , Depakote 500mg , Topiramate 50mg , and Invega sustenna (injection). Pharmacy reports she has only filled Haldol - Haldol PRN - PRN Klonopin -  Precedex stopped today - Likely will need to restart psych medications.  Best practice:  Diet: TFs Pain/Anxiety/Delirium protocol (if indicated): Precedex, PRN Kolnopin VAP protocol (if indicated): yes DVT prophylaxis: SCD GI prophylaxis: Pepcid Glucose control:  NA Mobility: BR Code Status: FULL Family Communication: Will try again to contact family. Main contact number has changed per individual who answered the phone. Son's number is also not accurate. Daughter did not answer 4/18. Disposition: ICU>>likely transfer today.  Labs   CBC: Recent Labs  Lab 06/27/19 2242 06/27/19 2242 06/28/19 0311 06/28/19 0330 06/29/19 0403 06/30/19 0303 07/01/19 0302  WBC 10.6*  --   --  10.9* 8.0 11.8* 12.2*  NEUTROABS 6.6  --   --   --   --   --   --   HGB 13.8   < > 12.9 12.6 12.4 13.9 13.3  HCT 43.1   < > 38.0 39.0 38.3 43.5 42.0  MCV 90.0  --   --  89.7 88.7 89.0 91.7  PLT 289  --   --  257 212 231 226   < > = values in this interval not displayed.    Basic Metabolic Panel: Recent Labs  Lab 06/27/19 2242 06/27/19 2242 06/28/19 0311 06/28/19 0330 06/29/19 0403 06/30/19 0303 07/01/19 0302  NA 136   < > 133* 134* 135 143 146*  K 4.2   < > 4.5 4.6 4.6 3.9 4.2  CL 97*  --   --  98 99 103 104  CO2 26  --   --  24 26 29 27   GLUCOSE 132*  --   --  212* 228* 111* 137*  BUN 17  --   --  17 25* 37* 33*  CREATININE 1.01*  --   --  1.01* 1.11* 0.82 0.78  CALCIUM 9.1  --   --  8.6* 8.9 9.7 9.6  MG  --   --   --  2.0 2.3 2.1 2.1  PHOS  --   --   --  4.6 3.8 3.3 4.3   < > = values in this interval not displayed.   GFR: Estimated Creatinine Clearance: 101.4 mL/min (by C-G formula based on SCr of 0.78 mg/dL). Recent Labs  Lab 06/28/19 0330 06/29/19 0403 06/30/19 0303 07/01/19 0302  PROCALCITON <0.10 <0.10  --   --   WBC 10.9* 8.0 11.8* 12.2*    Liver Function Tests: No results for input(s): AST, ALT, ALKPHOS, BILITOT, PROT, ALBUMIN in the last 168 hours. No results for input(s): LIPASE, AMYLASE in the last 168 hours. No results for input(s): AMMONIA in the last 168 hours.  ABG    Component Value Date/Time   PHART 7.308 (L) 06/28/2019 0311   PCO2ART 53.6 (H) 06/28/2019 0311   PO2ART 75.0 (L) 06/28/2019 0311   HCO3 26.8 06/28/2019 0311    TCO2 28 06/28/2019 0311   ACIDBASEDEF 1.5 01/30/2008 0608   O2SAT 93.0 06/28/2019 0311     Coagulation Profile: Recent Labs  Lab 06/27/19 2242  INR 1.0    Cardiac Enzymes: No results for input(s): CKTOTAL, CKMB, CKMBINDEX, TROPONINI in the last 168 hours.  HbA1C: Hgb A1c MFr Bld  Date/Time Value Ref Range Status  06/30/2019 03:03 AM 7.8 (H) 4.8 - 5.6 % Final    Comment:    (NOTE) Pre diabetes:          5.7%-6.4% Diabetes:              >6.4% Glycemic control for   <  7.0% adults with diabetes   06/21/2010 03:52 PM (H) <5.7 % Final   5.9 (NOTE)                                                                       According to the ADA Clinical Practice Recommendations for 2011, when HbA1c is used as a screening test:   >=6.5%   Diagnostic of Diabetes Mellitus           (if abnormal result  is confirmed)  5.7-6.4%   Increased risk of developing Diabetes Mellitus  References:Diagnosis and Classification of Diabetes Mellitus,Diabetes Care,2011,34(Suppl 1):S62-S69 and Standards of Medical Care in         Diabetes - 2011,Diabetes Care,2011,34  (Suppl 1):S11-S61.    CBG: Recent Labs  Lab 07/01/19 0755 07/01/19 1135 07/01/19 1525 07/01/19 1603 07/01/19 1927  GLUCAP 123* 143* 64* 126* 170*    Review of Systems:   She denies ROS today  Past Medical History  She,  has a past medical history of Arthritis, Bipolar 1 disorder (HCC), Hypertension, Obesity, Psychiatric illness, Schizo-affective schizophrenia (HCC), Seizures (HCC), and Substance abuse (HCC).   Surgical History    Past Surgical History:  Procedure Laterality Date  . STOMACH SURGERY    . TRACHEOSTOMY TUBE PLACEMENT N/A 06/28/2019   Procedure: TRACHEOSTOMY - 8DCT;  Surgeon: Serena Colonel, MD;  Location: [facility] OR;  Service: ENT;  Laterality: N/A;     Social History   reports that she has been smoking cigarettes. She has been smoking about 1.00 pack per day. She has never used smokeless tobacco. She reports current  alcohol use of about 6.0 standard drinks of alcohol per week. She reports current drug use. Frequency: 5.00 times per week. Drugs: "Crack" cocaine and Cocaine.   Family History   Her family history includes Alzheimer's disease in her mother; Depression in her mother.   Allergies Allergies  Allergen Reactions  . Lisinopril Swelling    Angioedema resulting in emergent trach     Home Medications  Prior to Admission medications   Medication Sig Start Date End Date Taking? Authorizing Provider  paliperidone (INVEGA SUSTENNA) 234 MG/1.5ML SUSP injection Inject 234 mg into the muscle every 30 (thirty) days.   Yes [provider]  cephALEXin (KEFLEX) 500 MG capsule 1 cap po bid x 7 days Patient not taking: Reported on 06/28/2019 06/21/19   Muthersbaugh, Dahlia Client, PA-C  divalproex (DEPAKOTE ER) 500 MG 24 hr tablet Take 1 tablet (500 mg total) by mouth 2 (two) times daily. Patient not taking: Reported on 03/03/2019 12/28/15   Thermon Leyland, NP  hydrochlorothiazide (MICROZIDE) 12.5 MG capsule Take 1 capsule (12.5 mg total) by mouth daily. Patient not taking: Reported on 03/03/2019 12/16/18   Sherryl Manges S, PA-C  paliperidone (INVEGA SUSTENNA) 156 MG/ML SUSP injection Inject 1 mL (156 mg total) into the muscle every 30 (thirty) days. Patient not taking: Reported on 12/03/2016 12/28/15   Fransisca Kaufmann A, NP  potassium chloride (K-DUR,KLOR-CON) 10 MEQ tablet Take 1 tablet (10 mEq total) by mouth daily. Patient not taking: Reported on 03/03/2019 12/28/15   Thermon Leyland, NP  QUEtiapine (SEROQUEL) 100 MG tablet Take 100 mg by mouth at bedtime.    [provider]  risperiDONE (  RISPERDAL) 2 MG tablet Take 1 tablet (2 mg total) by mouth 2 (two) times daily. Patient not taking: Reported on 03/03/2019 12/28/15   Niel Hummer, NP  topiramate (TOPAMAX) 50 MG tablet Take 50 mg by mouth. 09/16/16   [provider]  traZODone (DESYREL) 50 MG tablet Take 50 mg by mouth. 09/16/16    [provider]  Valbenazine Tosylate (INGREZZA) 40 MG CAPS Take 1 capsule by mouth daily.    [provider]     Critical care time:     Marianna Payment, D.O. [facility], PGY-1 Pager: [phone], Phone: [phone] Date 07/02/2019 Time 11:36 AM

## 2019-07-01 NOTE — Progress Notes (Signed)
CRITICAL VALUE ALERT  Critical Value:  4  Date & Time Notied: 4/19 @1530   Provider Notified: per protocol orders  Orders Received/Actions taken: 36ml (1/2) amp of Dextrose 50%, blood sugar rechecked and increased to 126. Patient asymptomatic.

## 2019-07-01 NOTE — Progress Notes (Signed)
Patient ID: [name], female   DOB: 01-26-1961, 59 y.o.   MRN: [id number] Much less swelling, will downsize trach tomorrow.

## 2019-07-01 NOTE — Progress Notes (Signed)
NAME:  [name], MRN:  [id number], DOB:  [birth year], LOS: 3 ADMISSION DATE:  06/27/2019, CONSULTATION DATE:  4/16 REFERRING MD:  Dr. Charm Barges, CHIEF COMPLAINT:  Angioedema   Brief History   59 year old female presented with angioedema and airway compromise. Unable to be intubated and ED and was taken for emergent tracheostomy.   History of present illness   Patient is encephalopathic and/or intubated. Therefore history has been obtained from chart review. 59 year old female with PMH as below, which is significant for HTN, schizoaffective disorder, bipolar disorder, seizures, and (cocaine) substance abuse. She presented to [facility] ED in the late PM hours of 4/15 with complaints of face and throat swelling. On initial assessment she was able to speak a few garbled words at a time. The decision was made to intubate the patient for airway protection and anesthesia was called for difficult airway. Unfortunately the anesthesiologist was unable to intubate the patient and she was taken emergently to OR for tracheostomy. PCCM asked to admit to ICU.   Past Medical History   has a past medical history of Arthritis, Bipolar 1 disorder (HCC), Hypertension, Obesity, Psychiatric illness, Schizo-affective schizophrenia (HCC), Seizures (HCC), and Substance abuse (HCC).  Significant Hospital Events   4/15: Admitted for angioedema and taken for emergent tracheostomy   Consults:  Anesthesia ENT  Procedures:  Tracheostomy 4/16 >  Significant Diagnostic Tests:  None  Micro Data:  4/15: SARs COVID Negative  4/16: MRSA PCR Negative  Antimicrobials:  None  Interim history/subjective:  Drowsy, awaken to voice. Can write slowly on paper, but is difficult to read. Seems to indicated that her trach site is painful.  Objective   Blood pressure (!) 149/78, pulse 72, temperature 98.9 F (37.2 C), resp. rate (!) 26, weight 114.7 kg, last menstrual period 01/13/2012, SpO2 96 %.    Vent Mode:  PSV;CPAP FiO2 (%):  [40 %] 40 % Set Rate:  [18 bmp] 18 bmp Vt Set:  [490 mL] 490 mL PEEP:  [5 cmH20-8 cmH20] 5 cmH20 Pressure Support:  [10 cmH20] 10 cmH20 Plateau Pressure:  [19 cmH20-24 cmH20] 19 cmH20   Intake/Output Summary (Last 24 hours) at 07/01/2019 0800 Last data filed at 07/01/2019 0700 Gross per 24 hour  Intake 2725.97 ml  Output 4100 ml  Net -1374.03 ml   Filed Weights   06/28/19 0247 06/30/19 0302 07/01/19 0500  Weight: 115.8 kg 114.4 kg 114.7 kg    Examination: General: obese female on vent HENT: [place]/AT, Angioedema slowly improving Lungs: Mechanical breath sounds bilaterally Cardiovascular: RRR, no MRG Abdomen: Soft, non-distended Extremities: No acute deformity, no edema Neuro: Sedated, Awakens to voice and follows commands  Resolved Hospital Problem list     Assessment & Plan:   Acute hypoxemic respiratory failure s/p emergent trach Angioedema, with airway compromise. ACE-inhibitor vs cocaine use (UDS +). Lisinopril added to allergy list. C4 mildly elevated to 42, will not pursue C1 inhibitor lab. Reports some pain at trach site. - Will need to follow up with ENT to see if/when they plan to re-evaluate  - Continue famotidine and benadryl - Weaning Sedation - Remains on MV, FiO2 weaned to 40%, PEEP of 5 - SBT, Likely TCT today  Hyperglycemia: Improved - No Hx of Diabetes, A1c now 7.8 - TF coverage - SSI  HTN 150/80s - Holding lisinopril - HCTZ 12.5mg  Daily  Schizoaffective disorder Bipolar disorder Seizure history Unclear which home medications she is taking. [facility] July 2020 was taking: Cogentin 0.5mg , Quetiapine  100mg , Depakote 500mg , Topiramate 50mg , and Invega sustenna (injection). Pharmacy reports she has only filled Haldol - Haldol PRN - PRN Klonopin - Wean Precedex  Best practice:  Diet: TFs Pain/Anxiety/Delirium protocol (if indicated): Precedex, PRN Kolnopin VAP protocol (if indicated): yes DVT prophylaxis: SCD GI  prophylaxis: Pepcid Glucose control: NA Mobility: BR Code Status: FULL Family Communication: Will try again to contact family. Main contact number has changed per individual who answered the phone. Son's number is also not accurate. Daughter did not answer 4/18. Disposition: ICU  Labs   CBC: Recent Labs  Lab 06/27/19 2242 06/27/19 2242 06/28/19 0311 06/28/19 0330 06/29/19 0403 06/30/19 0303 07/01/19 0302  WBC 10.6*  --   --  10.9* 8.0 11.8* 12.2*  NEUTROABS 6.6  --   --   --   --   --   --   HGB 13.8   < > 12.9 12.6 12.4 13.9 13.3  HCT 43.1   < > 38.0 39.0 38.3 43.5 42.0  MCV 90.0  --   --  89.7 88.7 89.0 91.7  PLT 289  --   --  257 212 231 226   < > = values in this interval not displayed.    Basic Metabolic Panel: Recent Labs  Lab 06/27/19 2242 06/27/19 2242 06/28/19 0311 06/28/19 0330 06/29/19 0403 06/30/19 0303 07/01/19 0302  NA 136   < > 133* 134* 135 143 146*  K 4.2   < > 4.5 4.6 4.6 3.9 4.2  CL 97*  --   --  98 99 103 104  CO2 26  --   --  24 26 29 27   GLUCOSE 132*  --   --  212* 228* 111* 137*  BUN 17  --   --  17 25* 37* 33*  CREATININE 1.01*  --   --  1.01* 1.11* 0.82 0.78  CALCIUM 9.1  --   --  8.6* 8.9 9.7 9.6  MG  --   --   --  2.0 2.3 2.1 2.1  PHOS  --   --   --  4.6 3.8 3.3 4.3   < > = values in this interval not displayed.   GFR: Estimated Creatinine Clearance: 101.4 mL/min (by C-G formula based on SCr of 0.78 mg/dL). Recent Labs  Lab 06/28/19 0330 06/29/19 0403 06/30/19 0303 07/01/19 0302  PROCALCITON <0.10 <0.10  --   --   WBC 10.9* 8.0 11.8* 12.2*    Liver Function Tests: No results for input(s): AST, ALT, ALKPHOS, BILITOT, PROT, ALBUMIN in the last 168 hours. No results for input(s): LIPASE, AMYLASE in the last 168 hours. No results for input(s): AMMONIA in the last 168 hours.  ABG    Component Value Date/Time   PHART 7.308 (L) 06/28/2019 0311   PCO2ART 53.6 (H) 06/28/2019 0311   PO2ART 75.0 (L) 06/28/2019 0311   HCO3 26.8  06/28/2019 0311   TCO2 28 06/28/2019 0311   ACIDBASEDEF 1.5 01/30/2008 0608   O2SAT 93.0 06/28/2019 0311     Coagulation Profile: Recent Labs  Lab 06/27/19 2242  INR 1.0    Cardiac Enzymes: No results for input(s): CKTOTAL, CKMB, CKMBINDEX, TROPONINI in the last 168 hours.  HbA1C: Hgb A1c MFr Bld  Date/Time Value Ref Range Status  06/30/2019 03:03 AM 7.8 (H) 4.8 - 5.6 % Final    Comment:    (NOTE) Pre diabetes:          5.7%-6.4% Diabetes:              >  6.4% Glycemic control for   <7.0% adults with diabetes   06/21/2010 03:52 PM (H) <5.7 % Final   5.9 (NOTE)                                                                       According to the ADA Clinical Practice Recommendations for 2011, when HbA1c is used as a screening test:   >=6.5%   Diagnostic of Diabetes Mellitus           (if abnormal result  is confirmed)  5.7-6.4%   Increased risk of developing Diabetes Mellitus  References:Diagnosis and Classification of Diabetes Mellitus,Diabetes JSEG,3151,76(HYWVP 1):S62-S69 and Standards of Medical Care in         Diabetes - 2011,Diabetes Care,2011,34  (Suppl 1):S11-S61.    CBG: Recent Labs  Lab 06/30/19 1519 06/30/19 1958 06/30/19 2354 07/01/19 0418 07/01/19 0755  GLUCAP 106* 146* 185* 118* 123*    Review of Systems:   Unable as patient is encephalopathic and intubated.   Past Medical History  She,  has a past medical history of Arthritis, Bipolar 1 disorder ([employer]), Hypertension, Obesity, Psychiatric illness, Schizo-affective schizophrenia ([place]), Seizures ([place]), and Substance abuse ([place]).   Surgical History    Past Surgical History:  Procedure Laterality Date  . STOMACH SURGERY       Social History   reports that she has been smoking cigarettes. She has been smoking about 1.00 pack per day. She has never used smokeless tobacco. She reports current alcohol use of about 6.0 standard drinks of alcohol per week. She reports current drug use. Frequency: 5.00 times  per week. Drugs: "Crack" cocaine and Cocaine.   Family History   Her family history includes Alzheimer's disease in her mother; Depression in her mother.   Allergies Allergies  Allergen Reactions  . Lisinopril Swelling    Angioedema resulting in emergent trach     Home Medications  Prior to Admission medications   Medication Sig Start Date End Date Taking? Authorizing Provider  cephALEXin (KEFLEX) 500 MG capsule 1 cap po bid x 7 days 06/21/19   Muthersbaugh, Jarrett Soho, PA-C  divalproex (DEPAKOTE ER) 500 MG 24 hr tablet Take 1 tablet (500 mg total) by mouth 2 (two) times daily. Patient not taking: Reported on 03/03/2019 12/28/15   Niel Hummer, NP  hydrochlorothiazide (MICROZIDE) 12.5 MG capsule Take 1 capsule (12.5 mg total) by mouth daily. Patient not taking: Reported on 03/03/2019 12/16/18   Kendrick, Caitlyn S, PA-C  lisinopril (PRINIVIL,ZESTRIL) 5 MG tablet Take 1 tablet (5 mg total) by mouth daily. Patient not taking: Reported on 03/03/2019 12/28/15   Niel Hummer, NP  paliperidone (INVEGA SUSTENNA) 156 MG/ML SUSP injection Inject 1 mL (156 mg total) into the muscle every 30 (thirty) days. Patient not taking: Reported on 12/03/2016 12/28/15   Niel Hummer, NP  paliperidone (INVEGA SUSTENNA) 234 MG/1.5ML SUSP injection Inject 234 mg into the muscle every 30 (thirty) days.    [provider]  paliperidone (INVEGA) 6 MG 24 hr tablet Take 6 mg by mouth daily.    [provider]  potassium chloride (K-DUR,KLOR-CON) 10 MEQ tablet Take 1 tablet (10 mEq total) by mouth daily. Patient not taking: Reported on 03/03/2019 12/28/15   Rosana Hoes,  Gerome Apley, NP  QUEtiapine (SEROQUEL) 100 MG tablet Take 100 mg by mouth at bedtime.    [provider]  risperiDONE (RISPERDAL) 2 MG tablet Take 1 tablet (2 mg total) by mouth 2 (two) times daily. Patient not taking: Reported on 03/03/2019 12/28/15   Thermon Leyland, NP  topiramate (TOPAMAX) 50 MG tablet Take 50 mg by mouth. 09/16/16    [provider]  traZODone (DESYREL) 50 MG tablet Take 50 mg by mouth. 09/16/16   [provider]  Valbenazine Tosylate (INGREZZA) 40 MG CAPS Take 1 capsule by mouth daily.    [provider]        Ginette Otto, DO IM PGY-3 Pager: [phone]

## 2019-07-01 NOTE — Progress Notes (Signed)
eLink Physician-Brief Progress Note Patient Name: [name] DOB: 08-03-1960 MRN: [id number]   Date of Service  07/01/2019  HPI/Events of Note  SBP 166. DBP 80  eICU Interventions  Watch for now.      Intervention Category Intermediate Interventions: Hypertension - evaluation and management  Ranee Gosselin 07/01/2019, 12:23 AM

## 2019-07-01 NOTE — Progress Notes (Signed)
Patient restless, trying to talk over the trach, winching, PRN pain, agitation and anxiety med's given. Trach peri-wound red, bruised, swollen and increased secretions. MD Lyn Hollingshead paged and updated. Precedex started back up. Pending ENT arrival of trach assessment and intervention.

## 2019-07-02 DIAGNOSIS — T783XXA Angioneurotic edema, initial encounter: Secondary | ICD-10-CM | POA: Diagnosis not present

## 2019-07-02 DIAGNOSIS — J9601 Acute respiratory failure with hypoxia: Secondary | ICD-10-CM | POA: Diagnosis not present

## 2019-07-02 LAB — BASIC METABOLIC PANEL
Anion gap: 9 (ref 5–15)
BUN: 27 mg/dL — ABNORMAL HIGH (ref 6–20)
CO2: 31 mmol/L (ref 22–32)
Calcium: 9.3 mg/dL (ref 8.9–10.3)
Chloride: 107 mmol/L (ref 98–111)
Creatinine, Ser: 0.71 mg/dL (ref 0.44–1.00)
GFR calc Af Amer: >60 mL/min (ref >60)
GFR calc non Af Amer: >60 mL/min (ref >60)
Glucose, Bld: 139 mg/dL — ABNORMAL HIGH (ref 70–99)
Potassium: 4 mmol/L (ref 3.5–5.1)
Sodium: 147 mmol/L — ABNORMAL HIGH (ref 135–145)

## 2019-07-02 LAB — CBC
HCT: 41.1 % (ref 36.0–46.0)
Hemoglobin: 12.8 g/dL (ref 12.0–15.0)
MCH: 28.7 pg (ref 26.0–34.0)
MCHC: 31.1 g/dL (ref 30.0–36.0)
MCV: 92.2 fL (ref 80.0–100.0)
Platelets: 203 10*3/uL (ref 150–400)
RBC: 4.46 MIL/uL (ref 3.87–5.11)
RDW: 14.1 % (ref 11.5–15.5)
WBC: 13 10*3/uL — ABNORMAL HIGH (ref 4.0–10.5)
nRBC: 0 % (ref 0.0–0.2)

## 2019-07-02 LAB — MAGNESIUM: Magnesium: 2.2 mg/dL (ref 1.7–2.4)

## 2019-07-02 LAB — GLUCOSE, CAPILLARY
Glucose-Capillary: 131 mg/dL — ABNORMAL HIGH (ref 70–99)
Glucose-Capillary: 91 mg/dL (ref 70–99)
Glucose-Capillary: 120 mg/dL — ABNORMAL HIGH (ref 70–99)
Glucose-Capillary: 96 mg/dL (ref 70–99)
Glucose-Capillary: 196 mg/dL — ABNORMAL HIGH (ref 70–99)

## 2019-07-02 LAB — PHOSPHORUS: Phosphorus: 4 mg/dL (ref 2.5–4.6)

## 2019-07-02 MED ORDER — OXYCODONE-ACETAMINOPHEN 5-325 MG PO TABS
1.0000 | ORAL_TABLET | ORAL | Status: DC | PRN
  Administered 2019-07-02 – 2019-07-09 (×11): 1
  Filled 2019-07-02 (×10): qty 1

## 2019-07-02 MED ORDER — FREE WATER
200.0000 mL | Freq: Four times a day (QID) | Status: DC
  Administered 2019-07-02 – 2019-07-09 (×27): 200 mL

## 2019-07-02 MED ORDER — OSMOLITE 1.5 CAL PO LIQD
1000.0000 mL | ORAL | Status: DC
  Administered 2019-07-02 – 2019-07-07 (×7): 1000 mL
  Filled 2019-07-02 (×11): qty 1000

## 2019-07-02 MED ORDER — LABETALOL HCL 100 MG PO TABS
100.0000 mg | ORAL_TABLET | Freq: Two times a day (BID) | ORAL | Status: DC
  Administered 2019-07-02: 21:00:00 100 mg via ORAL
  Filled 2019-07-02 (×3): qty 1

## 2019-07-02 NOTE — Progress Notes (Signed)
Patient ID: [name], female   DOB: 1960-05-22, 59 y.o.   MRN: [id number] Tracheostomy changed to #6 cuffed trach tube (unfortunately an uncuffed tube was not brought up).  She tolerated this well.  Recommend downsized to a #4 uncuffed trach tube in 1-2 days, respiratory therapy can do this, and then if doing well can be decannulated.    Call if needed.

## 2019-07-02 NOTE — Evaluation (Signed)
Passy-Muir Speaking Valve - Evaluation Patient Details  Name: [name] MRN: [id number] Date of Birth: 1960-09-11  Today's Date: 07/02/2019 Time: 0820-0855 SLP Time Calculation (min) (ACUTE ONLY): 35 min  Past Medical History:  Past Medical History:  Diagnosis Date  . Arthritis   . Bipolar 1 disorder (HCC)   . Hypertension   . Obesity   . Psychiatric illness   . Schizo-affective schizophrenia (HCC)   . Seizures (HCC)   . Substance abuse [facility])    Past Surgical History:  Past Surgical History:  Procedure Laterality Date  . STOMACH SURGERY    . TRACHEOSTOMY TUBE PLACEMENT N/A 06/28/2019   Procedure: TRACHEOSTOMY - 8DCT;  Surgeon: Serena Colonel, MD;  Location: [facility] OR;  Service: ENT;  Laterality: N/A;   HPI:  59 year old woman who presented with angioedema and airway compromise secondary to ACE inhibitor use. The decision was made to intubate the patient for airway protection and anesthesia was called for difficult airway. Unfortunately the anesthesiologist was unable to intubate the patient and she was taken emergently to OR for tracheostomy. PMH is significant for HTN, schizoaffective disorder, bipolar disorder, seizures, and (cocaine) substance abuse.    Assessment / Plan / Recommendation Clinical Impression  Pts cuff deflated, but even with max effort and motivation from call from family member she was only able to achieve phonation with a few single social words with extrememly strained vocal quality. Minimal redirection of air to upper airway possible with angioedema and trach. ENT plans to downsize soon with may increase airflow and success with PMSV. WIll f/u for further trials. For now, PMSV with SLP only.  SLP Visit Diagnosis: Aphonia (R49.1)    SLP Assessment  Patient needs continued Speech Lanaguage Pathology Services    Follow Up Recommendations  Skilled Nursing facility    Frequency and Duration min 2x/week  2 weeks    PMSV Trial PMSV was placed for: 15 second  intervals Able to redirect subglottic air through upper airway: Yes Able to Attain Phonation: Yes Voice Quality: (strained) Level of Secretion Expectoration with PMSV: Not observed Breath Support for Phonation: Severely decreased Respirations During Trial: (!) 35 SpO2 During Trial: 98 %   Tracheostomy Tube  Additional Tracheostomy Tube Assessment Trach Collar Period: all wwaking hours    Vent Dependency  Vent Dependent: No FiO2 (%): 28 %    Cuff Deflation Trial  GO Tolerated Cuff Deflation: Yes Length of Time for Cuff Deflation Trial: 30 minutes Behavior: Cooperative        [name], Riley Nearing 07/02/2019, 9:23 AM

## 2019-07-02 NOTE — Evaluation (Signed)
Occupational Therapy Evaluation Patient Details Name: [name] MRN: [id number] DOB: 03-May-1960 Today's Date: 07/02/2019    History of Present Illness Pt is a 59 year old woman who presented with angioedema and airway compromise secondary to ACE inhibitor use. The decision was made to intubate the patient for airway protection and anesthesia was called for difficult airway. Unfortunately the anesthesiologist was unable to intubate the patient and she was taken emergently to OR for tracheostomy. PMH is significant for HTN, schizoaffective disorder, bipolar disorder, seizures, and (cocaine) substance abuse.     Clinical Impression   This 59 y/o female presents with the above. Pt currently on trach collar but able to answer some questions via yes/no questions. Per chart and pt report she was residing in group home PTA, reports she was independent with ADL and functional mobility. Pt very pleasant and willing to participate in therapies today, presenting with impaired cognition, R sided weakness, decreased sitting/standing balance, decreased activity tolerance impacting her functional performance. Pt following most simple commands given intermittent cues, tolerating activity seated EOB and transfer OOB to recliner. Pt requiring two person assist for completion of functional transfers; requiring overall minA for sitting balance EOB due to R lateral lean/LOB. She currently requires max-totalA for ADL tasks. Pt will benefit from continued acute OT services and feel she will benefit from CIR level therapies at time of discharge to maximize her safety and independence with ADL and mobility. Will follow.     Follow Up Recommendations  CIR;Supervision/Assistance - 24 hour    Equipment Recommendations  3 in 1 bedside commode;Other (comment)(defer to next venue)    Recommendations for Other Services Rehab consult     Precautions / Restrictions Precautions Precautions: Fall Precaution Comments:  NG Restrictions Weight Bearing Restrictions: No      Mobility Bed Mobility Overal bed mobility: Needs Assistance Bed Mobility: Supine to Sit     Supine to sit: Max assist     General bed mobility comments: assist for LEs and trunk upright, assist to steady initially, pt able to scoot towards EOB given cues  Transfers Overall transfer level: Needs assistance Equipment used: 2 person hand held assist Transfers: Sit to/from [employer] Sit to Stand: Mod assist;+2 physical assistance Stand pivot transfers: Mod assist;+2 physical assistance       General transfer comment: boosting/steadying assist, pt able to step LLE with pivot towards recliner but with increased difficulty stepping/moving RLE    Balance Overall balance assessment: Needs assistance Sitting-balance support: Feet supported;Single extremity supported;Bilateral upper extremity supported Sitting balance-Leahy Scale: Poor Sitting balance - Comments: able to maintain balance with minguard assist for approx 10-15 seconds at a time, often with R lateral lean/LOB, requires cues/minA to correct    Standing balance support: Bilateral upper extremity supported Standing balance-Leahy Scale: Poor Standing balance comment: +2 assist for balance                           ADL either performed or assessed with clinical judgement   ADL Overall ADL's : Needs assistance/impaired Eating/Feeding: NPO   Grooming: Maximal assistance;Wash/dry face Grooming Details (indicate cue type and reason): assist to support L elbow to bring hand to face, assist for thoroughness Upper Body Bathing: Moderate assistance;Sitting   Lower Body Bathing: Maximal assistance;+2 for physical assistance;Sit to/from stand   Upper Body Dressing : Maximal assistance;Sitting   Lower Body Dressing: Maximal assistance;+2 for physical assistance;Sit to/from stand   Toilet Transfer: Moderate assistance;+2 for physical  assistance;Stand-pivot Armed forces technical officer Details (indicate cue type and reason): simulated via transfer to recliner Toileting- Clothing Manipulation and Hygiene: Maximal assistance;+2 for physical assistance       Functional mobility during ADLs: Moderate assistance;+2 for physical assistance(stand pivot transfer) General ADL Comments: pt with R side weakness, R lateral lean/LOB, decreased sitting/standing balance     Vision   Additional Comments: will benefit from visual assessment     Perception     Praxis      Pertinent Vitals/Pain Pain Assessment: No/denies pain     Hand Dominance Right   Extremity/Trunk Assessment Upper Extremity Assessment Upper Extremity Assessment: RUE deficits/detail RUE Deficits / Details: RUE notably weaker than LUE (RUE grossly 3+/5 throughout)    Lower Extremity Assessment Lower Extremity Assessment: Defer to PT evaluation   Cervical / Trunk Assessment Cervical / Trunk Assessment: (difficulty maintaining head upright)   Communication Communication Communication: Tracheostomy   Cognition Arousal/Alertness: Awake/alert;Lethargic Behavior During Therapy: Flat affect Overall Cognitive Status: Difficult to assess                                 General Comments: pt following majority of basic commands, answering yes/no questions, at times requires cues to maintain eyes open. Pt with trach so difficult to fully assess    General Comments  VSS on trach collar    Exercises     Shoulder Instructions      Home Living Family/patient expects to be discharged to:: Group home                                        Prior Functioning/Environment Level of Independence: Independent        Comments: pt reports no use of AD for mobility and completing ADL without assist - use of yes/no for obtaining PLOF due to trach. Pt reports she doesn't drive        OT Problem List: Decreased strength;Decreased range of  motion;Decreased activity tolerance;Impaired balance (sitting and/or standing);Decreased coordination;Decreased cognition;Decreased safety awareness;Decreased knowledge of use of DME or AE;Obesity;Impaired UE functional use;Cardiopulmonary status limiting activity      OT Treatment/Interventions: Self-care/ADL training;Therapeutic exercise;Energy conservation;DME and/or AE instruction;Therapeutic activities;Cognitive remediation/compensation;Patient/family education;Balance training;Neuromuscular education    OT Goals(Current goals can be found in the care plan section) Acute Rehab OT Goals Patient Stated Goal: none stated, agreeable to working with therapy OT Goal Formulation: With patient Time For Goal Achievement: 07/16/19 Potential to Achieve Goals: Good  OT Frequency: Min 2X/week   Barriers to D/C:            Co-evaluation PT/OT/SLP Co-Evaluation/Treatment: Yes Reason for Co-Treatment: Complexity of the patient's impairments (multi-system involvement) PT goals addressed during session: Mobility/safety with mobility OT goals addressed during session: ADL's and self-care      AM-PAC OT "6 Clicks" Daily Activity     Outcome Measure Help from another person eating meals?: Total Help from another person taking care of personal grooming?: A Lot Help from another person toileting, which includes using toliet, bedpan, or urinal?: Total Help from another person bathing (including washing, rinsing, drying)?: A Lot Help from another person to put on and taking off regular upper body clothing?: A Lot Help from another person to put on and taking off regular lower body clothing?: Total 6 Click Score: 9   End of Session Equipment Utilized During  Treatment: Oxygen Nurse Communication: Mobility status  Activity Tolerance: Patient tolerated treatment well Patient left: in chair;with call bell/phone within reach;with chair alarm set  OT Visit Diagnosis: Muscle weakness (generalized)  (M62.81);Unsteadiness on feet (R26.81);Other symptoms and signs involving cognitive function                Time: 1217-1242 OT Time Calculation (min): 25 min Charges:  OT General Charges $OT Visit: 1 Visit OT Evaluation $OT Eval Moderate Complexity: 1 Mod  Marcy Siren, OT Acute Rehabilitation Services Pager [phone] Office [phone]  Orlando Penner 07/02/2019, 2:53 PM

## 2019-07-02 NOTE — Progress Notes (Signed)
Nutrition Follow-up  DOCUMENTATION CODES:   Obesity unspecified  INTERVENTION:   Tube Feeding: Change to Osmolite 1.2 at 65 ml/hr Provides 1872 kcals, 87 g of protein and 1264 mL of free water  Recommend free water flush of 200 mL q 6 hours; total free water 2064 mL  NUTRITION DIAGNOSIS:   Inadequate oral intake related to inability to eat as evidenced by NPO status.  Being addressed via TF   GOAL:   Provide needs based on ASPEN/SCCM guidelines  Progressing  MONITOR:   TF tolerance, Vent status  REASON FOR ASSESSMENT:   Consult, Ventilator Enteral/tube feeding initiation and management  ASSESSMENT:   Pt with PMH of bipolar 1 disorder, HTN, obesity, sxhizo-affective schizophrenia, sz, and substance abuse admitted 4/15 for angioedema and had emergent trach placed.  4/15 Admitted, Emergent trach 4/16 Cortrak placed 4/20 Trach downsized  Tolerating trach collar PMV trials with SLP, plan for FEES, remain NPO at present  Vital High Protein at 20 ml/hr, Pro-Stat 60 mL TID via Cortrak  Labs: sodium 147 (H), Creatinine wdl, CBGs 91-170 Meds: MVI with Minerals, ss novolog, novolog q 4 hours  Diet Order:   Diet Order            Diet NPO time specified  Diet effective now              EDUCATION NEEDS:   No education needs have been identified at this time  Skin:  Skin Assessment: Reviewed RN Assessment  Last BM:  4/20  Height:   Ht Readings from Last 1 Encounters:  06/21/19 5\' 7"  (1.702 m)    Weight:   Wt Readings from Last 1 Encounters:  07/02/19 113 kg    Ideal Body Weight:  61.3 kg  BMI:  Body mass index is 39.02 kg/m.  Estimated Nutritional Needs:   Kcal:  1700-1900 kcals  Protein:  85-100 g  Fluid:  >/= 1.7 L   07/04/19 MS, RDN, LDN, CNSC RD Pager Number and Weekend/On-Call After Hours Pager Located in [place]

## 2019-07-02 NOTE — Plan of Care (Signed)
  Problem: Health Behavior/Discharge Planning: Goal: Ability to manage health-related needs will improve Outcome: Progressing   Problem: Clinical Measurements: Goal: Ability to maintain clinical measurements within normal limits will improve Outcome: Progressing Goal: Respiratory complications will improve Outcome: Progressing Goal: Cardiovascular complication will be avoided Outcome: Progressing   Problem: Pain Managment: Goal: General experience of comfort will improve Outcome: Progressing   Problem: Safety: Goal: Ability to remain free from injury will improve Outcome: Progressing   Problem: Skin Integrity: Goal: Risk for impaired skin integrity will decrease Outcome: Progressing

## 2019-07-02 NOTE — Evaluation (Signed)
Clinical/Bedside Swallow Evaluation Patient Details  Name: [name] MRN: [id number] Date of Birth: 13-Jul-1960  Today's Date: 07/02/2019 Time: SLP Start Time (ACUTE ONLY): 0820 SLP Stop Time (ACUTE ONLY): 0855 SLP Time Calculation (min) (ACUTE ONLY): 35 min  Past Medical History:  Past Medical History:  Diagnosis Date  . Arthritis   . Bipolar 1 disorder ([place])   . Hypertension   . Obesity   . Psychiatric illness   . Schizo-affective schizophrenia ([place])   . Seizures ([place])   . Substance abuse [facility])    Past Surgical History:  Past Surgical History:  Procedure Laterality Date  . STOMACH SURGERY    . TRACHEOSTOMY TUBE PLACEMENT N/A 06/28/2019   Procedure: TRACHEOSTOMY - 8DCT;  Surgeon: Izora Gala, MD;  Location: [place];  Service: ENT;  Laterality: N/A;   HPI:  59 year old woman who presented with angioedema and airway compromise secondary to ACE inhibitor use. The decision was made to intubate the patient for airway protection and anesthesia was called for difficult airway. Unfortunately the anesthesiologist was unable to intubate the patient and she was taken emergently to OR for tracheostomy. PMH is significant for HTN, schizoaffective disorder, bipolar disorder, seizures, and (cocaine) substance abuse.    Assessment / Plan / Recommendation Clinical Impression  Pt evaluated with PMSV off due to poor tolerance. Pt demonstrated good oral manipulation of minimal PO trials despite severe angioedema of tongue and lips. She was able to make appropriate labial seal and transit ice with swallow response. When taking sips of water and puree however there were multiple swallows and delayed coughing concerning for decreased airway protection. Pts cough is poorly effective given limited redirection of air to upper airway. Recommend pt remain NPO today except for ice. Will proceed with FEES after planned trach downsize and discontinuation of precedex, hopefully tomorrow.       Aspiration Risk  Moderate aspiration risk    Diet Recommendation Ice chips PRN after oral care   Supervision: Full supervision/cueing for compensatory strategies    Other  Recommendations Oral Care Recommendations: Oral care BID Other Recommendations: Have oral suction available   Follow up Recommendations Skilled Nursing facility      Frequency and Duration min 2x/week  2 weeks       Prognosis        Swallow Study   General HPI: 59 year old woman who presented with angioedema and airway compromise secondary to ACE inhibitor use. The decision was made to intubate the patient for airway protection and anesthesia was called for difficult airway. Unfortunately the anesthesiologist was unable to intubate the patient and she was taken emergently to OR for tracheostomy. PMH is significant for HTN, schizoaffective disorder, bipolar disorder, seizures, and (cocaine) substance abuse.  Type of Study: Bedside Swallow Evaluation Diet Prior to this Study: NPO;NG Tube Respiratory Status: Trach Collar History of Recent Intubation: No Behavior/Cognition: Cooperative;Lethargic/Drowsy Oral Cavity Assessment: Edema Oral Care Completed by SLP: No Oral Cavity - Dentition: Missing dentition Self-Feeding Abilities: Needs assist Patient Positioning: Upright in bed Baseline Vocal Quality: Breathy;Aphonic Volitional Cough: Weak    Oral/Motor/Sensory Function Overall Oral Motor/Sensory Function: Generalized oral weakness(edematous tongue. lips)   Ice Chips Ice chips: Within functional limits Presentation: Spoon   Thin Liquid Thin Liquid: Impaired Presentation: Cup;Straw Pharyngeal  Phase Impairments: Cough - Immediate;Multiple swallows    Nectar Thick Nectar Thick Liquid: Not tested   Honey Thick Honey Thick Liquid: Not tested   Puree Puree: Impaired Presentation: Spoon Pharyngeal Phase Impairments: Cough - Delayed;Multiple swallows  Solid     Solid: Not tested     Harlon Ditty, MA CCC-SLP  Acute  Rehabilitation Services Pager [phone] Office [phone]  Claudine Mouton 07/02/2019,9:06 AM

## 2019-07-02 NOTE — Progress Notes (Addendum)
Physical Therapy Treatment Patient Details Name: Klover Priestly MRN: [id number] DOB: [birth year] Today's Date: 07/02/2019    History of Present Illness Pt is a 59 year old woman who presented with angioedema and airway compromise secondary to ACE inhibitor use. The decision was made to intubate the patient for airway protection and anesthesia was called for difficult airway. Unfortunately the anesthesiologist was unable to intubate the patient and she was taken emergently to OR for tracheostomy. PMH is significant for HTN, schizoaffective disorder, bipolar disorder, seizures, and (cocaine) substance abuse.      PT Comments    Pt admitted with/for angioedema with the need for emergent tracheostomy.  Pt needing mod to max assist of up to 2 persons..  Pt currently limited functionally due to the problems listed. ( See problems list.)   Pt will benefit from PT to maximize function and safety in order to get ready for next venue listed below. Pt initially lethargic, but aroused to participate with therapies.  Not totally reliable with answers to questions or following commands.  Emphasis on transitions, sitting balance  (tends to list off to the R consistently without cuing) and transfer to the chair.    Follow Up Recommendations  CIR;Supervision/Assistance - 24 hour     Equipment Recommendations  Other (comment)(TBA)    Recommendations for Other Services Rehab consult     Precautions / Restrictions Precautions Precautions: Fall Precaution Comments: NG Restrictions Weight Bearing Restrictions: No    Mobility  Bed Mobility Overal bed mobility: Needs Assistance Bed Mobility: Supine to Sit     Supine to sit: Max assist     General bed mobility comments: assist for LEs and trunk upright, assist to steady initially, pt able to scoot towards EOB given cues  Transfers Overall transfer level: Needs assistance Equipment used: 2 person hand held assist Transfers: Sit to/from [employer] Sit to Stand: Mod assist;+2 physical assistance Stand pivot transfers: Mod assist;+2 physical assistance       General transfer comment: boosting/steadying assist, pt able to step LLE with pivot towards recliner but with increased difficulty stepping/moving RLE  Ambulation/Gait                 Stairs             Wheelchair Mobility    Modified Rankin (Stroke Patients Only)       Balance Overall balance assessment: Needs assistance Sitting-balance support: Feet supported;Single extremity supported;Bilateral upper extremity supported Sitting balance-Leahy Scale: Poor Sitting balance - Comments: able to maintain balance with minguard assist for approx 10-15 seconds at a time, often with R lateral lean/LOB, requires cues/minA to correct    Standing balance support: Bilateral upper extremity supported Standing balance-Leahy Scale: Poor Standing balance comment: +2 assist for balance                            Cognition Arousal/Alertness: Awake/alert;Lethargic Behavior During Therapy: Flat affect Overall Cognitive Status: Difficult to assess                                 General Comments: pt following majority of basic commands, answering yes/no questions, at times requires cues to maintain eyes open. Pt with trach so difficult to fully assess       Exercises      General Comments General comments (skin integrity, edema, etc.): vss on trach collar.  Pertinent Vitals/Pain Pain Assessment: No/denies pain    Home Living Family/patient expects to be discharged to:: Group home                    Prior Function Level of Independence: Independent      Comments: pt reports no use of AD for mobility and completing ADL without assist - use of yes/no for obtaining PLOF due to trach. Pt reports she doesn't drive   PT Goals (current goals can now be found in the care plan section) Acute Rehab PT Goals Patient  Stated Goal: none stated, agreeable to working with therapy PT Goal Formulation: Patient unable to participate in goal setting Time For Goal Achievement: 07/16/19 Potential to Achieve Goals: Fair    Frequency    Min 3X/week      PT Plan      Co-evaluation PT/OT/SLP Co-Evaluation/Treatment: Yes Reason for Co-Treatment: Complexity of the patient's impairments (multi-system involvement) PT goals addressed during session: Mobility/safety with mobility OT goals addressed during session: ADL's and self-care      AM-PAC PT "6 Clicks" Mobility   Outcome Measure  Help needed turning from your back to your side while in a flat bed without using bedrails?: Total Help needed moving from lying on your back to sitting on the side of a flat bed without using bedrails?: Total Help needed moving to and from a bed to a chair (including a wheelchair)?: A Lot Help needed standing up from a chair using your arms (e.g., wheelchair or bedside chair)?: A Lot Help needed to walk in hospital room?: Total Help needed climbing 3-5 steps with a railing? : Total 6 Click Score: 8    End of Session   Activity Tolerance: Patient limited by lethargy;Patient limited by fatigue;Patient tolerated treatment well Patient left: in chair;with call bell/phone within reach;with chair alarm set Nurse Communication: Mobility status PT Visit Diagnosis: Other abnormalities of gait and mobility (R26.89);Difficulty in walking, not elsewhere classified (R26.2);Muscle weakness (generalized) (M62.81)     Time: 0263-7858 PT Time Calculation (min) (ACUTE ONLY): 25 min  Charges:  $Therapeutic Activity: 8-22 mins                     07/02/2019  Jacinto Halim., PT Acute Rehabilitation Services [phone]  (pager) [phone]  (office)   Eliseo Gum Jenissa Tyrell 07/02/2019, 2:59 PM

## 2019-07-03 DIAGNOSIS — Z930 Tracheostomy status: Secondary | ICD-10-CM | POA: Diagnosis not present

## 2019-07-03 DIAGNOSIS — J9600 Acute respiratory failure, unspecified whether with hypoxia or hypercapnia: Secondary | ICD-10-CM

## 2019-07-03 DIAGNOSIS — J9601 Acute respiratory failure with hypoxia: Secondary | ICD-10-CM | POA: Diagnosis not present

## 2019-07-03 LAB — BASIC METABOLIC PANEL
Anion gap: 9 (ref 5–15)
BUN: 21 mg/dL — ABNORMAL HIGH (ref 6–20)
CO2: 31 mmol/L (ref 22–32)
Calcium: 8.7 mg/dL — ABNORMAL LOW (ref 8.9–10.3)
Chloride: 104 mmol/L (ref 98–111)
Creatinine, Ser: 0.76 mg/dL (ref 0.44–1.00)
GFR calc Af Amer: >60 mL/min (ref >60)
GFR calc non Af Amer: >60 mL/min (ref >60)
Glucose, Bld: 141 mg/dL — ABNORMAL HIGH (ref 70–99)
Potassium: 3.9 mmol/L (ref 3.5–5.1)
Sodium: 144 mmol/L (ref 135–145)

## 2019-07-03 LAB — CBC
HCT: 38.3 % (ref 36.0–46.0)
Hemoglobin: 12 g/dL (ref 12.0–15.0)
MCH: 28.8 pg (ref 26.0–34.0)
MCHC: 31.3 g/dL (ref 30.0–36.0)
MCV: 92.1 fL (ref 80.0–100.0)
Platelets: 219 10*3/uL (ref 150–400)
RBC: 4.16 MIL/uL (ref 3.87–5.11)
RDW: 14.1 % (ref 11.5–15.5)
WBC: 10.5 10*3/uL (ref 4.0–10.5)
nRBC: 0 % (ref 0.0–0.2)

## 2019-07-03 LAB — MAGNESIUM: Magnesium: 2.3 mg/dL (ref 1.7–2.4)

## 2019-07-03 LAB — GLUCOSE, CAPILLARY
Glucose-Capillary: 132 mg/dL — ABNORMAL HIGH (ref 70–99)
Glucose-Capillary: 143 mg/dL — ABNORMAL HIGH (ref 70–99)
Glucose-Capillary: 161 mg/dL — ABNORMAL HIGH (ref 70–99)
Glucose-Capillary: 169 mg/dL — ABNORMAL HIGH (ref 70–99)
Glucose-Capillary: 170 mg/dL — ABNORMAL HIGH (ref 70–99)
Glucose-Capillary: 172 mg/dL — ABNORMAL HIGH (ref 70–99)
Glucose-Capillary: 88 mg/dL (ref 70–99)

## 2019-07-03 LAB — PHOSPHORUS: Phosphorus: 3.3 mg/dL (ref 2.5–4.6)

## 2019-07-03 MED ORDER — LABETALOL HCL 100 MG PO TABS
100.0000 mg | ORAL_TABLET | Freq: Two times a day (BID) | ORAL | Status: DC
  Administered 2019-07-03 – 2019-07-09 (×12): 100 mg
  Filled 2019-07-03 (×13): qty 1

## 2019-07-03 MED ORDER — QUETIAPINE FUMARATE 100 MG PO TABS
100.0000 mg | ORAL_TABLET | Freq: Every day | ORAL | Status: DC
  Administered 2019-07-03 – 2019-07-09 (×7): 100 mg
  Filled 2019-07-03: qty 2
  Filled 2019-07-03 (×2): qty 1
  Filled 2019-07-03: qty 2
  Filled 2019-07-03: qty 1
  Filled 2019-07-03: qty 2
  Filled 2019-07-03: qty 1

## 2019-07-03 NOTE — Progress Notes (Addendum)
CSW spoke with Jeni Salles at [place] [employer] who states she is no longer the patient's guardian. Joni Reining reports the patient's rights were restored last year and DSS is no longer involved.  CSW attempted to reach [place], who is listed as the owner of the group home on the patient's facesheet without success - a voicemail was left requesting a return call.  Edwin Dada, MSW, LCSW-A Transitions of Care  Clinical Social Worker  [facility] Emergency Departments  Medical ICU [phone]

## 2019-07-03 NOTE — Progress Notes (Signed)
Inpatient Rehabilitation Admissions Coordinator  Inpatient rehab consult received. I will contact Okey Regal , [place] tomorrow to get background about prior dispo before proceeding. She is not available today at this time.  Ottie Glazier, RN, MSN Rehab Admissions Coordinator [phone] 07/03/2019 4:13 PM

## 2019-07-03 NOTE — Procedures (Signed)
Cortrak  Person Inserting Tube:  [name], RD Tube Type:  Cortrak - 43 inches Tube Location:  Left nare Initial Placement:  Stomach Secured by: Bridle Technique Used to Measure Tube Placement:  Documented cm marking at nare/ corner of mouth Cortrak Secured At:  71 cm    Cortrak Tube Team Note:  Consult received to unclog an existing Cortrak feeding tube.   RD attempted to unclog previous Cortrak tube but clog was unable to be removed. Replaced tube with a new Cortrak tube.  No x-ray is required. RN may begin using tube.   If the tube becomes dislodged please keep the tube and contact the Cortrak team at [email or web address] (password [email or web address]) for replacement.  If after hours and replacement cannot be delayed, place a NG tube and confirm placement with an abdominal x-ray.   Mary Pacini, MS, RD, LDN Pager number available on Amion

## 2019-07-03 NOTE — Progress Notes (Signed)
NAME:[name], [name]:[id number], DOB:Sep 01, 1960, LOS:3 ADMISSION DATE:06/27/2019, CONSULTATION DATE: 4/16 REFERRING MD: Dr. Charm Barges, CHIEF COMPLAINT:Angioedema  Brief History   59 year old female with a pertinent PMH of Bipolar 1 disorder, HTN, Schizoaffective disorder, Seizures who presented to [facility] on 4/16 with angioedema and airway compromise requiring emergent tracheostomy.  History of present illness   59 year old female with a pertinent PMH of Bipolar 1 disorder, HTN, Schizoaffective disorder, Seizures who presented to [facility] on 4/16 with angioedema and airway compromise requiring emergent tracheostomy.  Patient is encephalopathic and/or intubated. Therefore history has been obtained from chart review. 59 year old female with PMH as below, which is significant for HTN, schizoaffective disorder, bipolar disorder, seizures, and (cocaine) substance abuse. She presented to [facility] ED in the late PM hours of 4/15 with complaints of face and throat swelling. On initial assessment she was able to speak a few garbled words at a time. The decision was made to intubate the patient for airway protection and anesthesia was called for difficult airway. Unfortunately the anesthesiologist was unable to intubate the patient and she was taken emergently to OR for tracheostomy. PCCM asked to admit to ICU. Past Medical History  Arthritis  Bipolar 1 disorder HTN Obesity Schizo-affective  Seizure Substance abuse  Significant Hospital Events   4/15:Admitted for angioedema and taken for emergent tracheostomy  Consults:  Anesthesia ENT  Procedures:  Tracheostomy 4/16 >  Significant Diagnostic Tests:  none  Micro Data:  4/15: SARs COVID Negative  4/16:MRSA PCR Negative  Antimicrobials:  none  Interim history/subjective:  Patient resting comfortably in bed. She does not appear to be in any apparent distress. Still has a difficult time communicating due to angioedema, but able to  tolerate minimal po input. Speech evaluated yesterday. Able to wean oxygen to RA and able to maintain saturations at 95%. She is at 0.4 of precedex. Ad we will continue to wean today. Weaning tracheostomy.  Objective   Blood pressure 113/64, pulse 76, temperature 99.7 F (37.6 C), temperature source Bladder, resp. rate (!) 31, weight 113 kg, last menstrual period 01/13/2012, SpO2 97 %.    FiO2 (%):  [28 %] 28 %   Intake/Output Summary (Last 24 hours) at 07/03/2019 0531 Last data filed at 07/03/2019 0400 Gross per 24 hour  Intake 1403.58 ml  Output 1310 ml  Net 93.58 ml   Filed Weights   07/01/19 0500 07/02/19 0424 07/03/19 0500  Weight: 114.7 kg 113 kg 113 kg    Examination: Physical Exam  Constitutional: She is oriented to person, place, and time. No distress.  HENT:  Head: Normocephalic and atraumatic.  Eyes: EOM are normal.  Cardiovascular: Normal rate and intact distal pulses.  Pulmonary/Chest: Effort normal. No respiratory distress.  Abdominal: Soft. She exhibits no distension.  Musculoskeletal:        General: No edema. Normal range of motion.     Cervical back: Normal range of motion.  Neurological: She is alert and oriented to person, place, and time.  Skin: Skin is warm and dry. She is not diaphoretic.    Resolved Hospital Problem list    Assessment & Plan:  59 year old female with a pertinent PMH of Bipolar 1 disorder, HTN, Schizoaffective disorder, Seizures who presented to [facility] on 4/16 with angioedema and airway compromise requiring emergent tracheostomy. Patient's angioedema edema has improved and is now on the trach collar at 4L supplemental O2 and minimal sedation with precedex.   Acute hypoxemic respiratory failure s/p emergent trach  Angioedema, with airway compromise. ACE-inhibitor vs cocaine use (UDS +). Lisinopril added to allergy list. C41mildly elevated to 42, will not pursue C1 inhibitor lab. Reports some pain at trach site. -Continue famotidine and  benadryl - WeaningSedation today, speech evaluation. - Restart home medications - Transition to floor today - Speech/PT recommends CIR, consulted today.  Hyperglycemia: Improved - No Hx of Diabetes, A1cnow 7.8 - TF coverage - SSI  HTN150/80s - Holding lisinopril - HCTZ 12.5mg  Daily  Schizoaffective disorder Bipolar disorder Seizure historyUnclear which home medications she is taking.[facility] July 2020 was taking: Cogentin 0.5mg , Quetiapine 100mg , Depakote 500mg , Topiramate 50mg , and Invega sustenna (injection).Pharmacy reports she has only filled Haldol - Continue Quetiapine - Haldol PRN - PRN Klonopin - Precedex stopped today.   Best practice:  Diet:TFs Pain/Anxiety/Delirium protocol (if indicated):Precedex, PRN Kolnopin VAP protocol (if indicated): yes DVT prophylaxis: SCD GI prophylaxis:Pepcid Glucose control: NA Mobility: BR Code Status: FULL Family Communication: Will try again to contact family. Main contact number has changed per individual who answered the phone.Son's number is also not accurate. Daughter did not answer 4/18. Disposition: ICU>>likely transfer today.  Labs   CBC: Recent Labs  Lab 06/27/19 2242 06/28/19 0311 06/28/19 0330 06/29/19 0403 06/30/19 0303 07/01/19 0302 07/02/19 0316  WBC 10.6*  --  10.9* 8.0 11.8* 12.2* 13.0*  NEUTROABS 6.6  --   --   --   --   --   --   HGB 13.8   < > 12.6 12.4 13.9 13.3 12.8  HCT 43.1   < > 39.0 38.3 43.5 42.0 41.1  MCV 90.0  --  89.7 88.7 89.0 91.7 92.2  PLT 289  --  257 212 231 226 203   < > = values in this interval not displayed.    Basic Metabolic Panel: Recent Labs  Lab 06/28/19 0330 06/29/19 0403 06/30/19 0303 07/01/19 0302 07/02/19 0316  NA 134* 135 143 146* 147*  K 4.6 4.6 3.9 4.2 4.0  CL 98 99 103 104 107  CO2 24 26 29 27 31   GLUCOSE 212* 228* 111* 137* 139*  BUN 17 25* 37* 33* 27*  CREATININE 1.01* 1.11* 0.82 0.78 0.71  CALCIUM 8.6* 8.9 9.7 9.6 9.3  MG 2.0 2.3 2.1  2.1 2.2  PHOS 4.6 3.8 3.3 4.3 4.0   GFR: Estimated Creatinine Clearance: 100.7 mL/min (by C-G formula based on SCr of 0.71 mg/dL). Recent Labs  Lab 06/28/19 0330 06/28/19 0330 06/29/19 0403 06/30/19 0303 07/01/19 0302 07/02/19 0316  PROCALCITON <0.10  --  <0.10  --   --   --   WBC 10.9*   < > 8.0 11.8* 12.2* 13.0*   < > = values in this interval not displayed.    Liver Function Tests: No results for input(s): AST, ALT, ALKPHOS, BILITOT, PROT, ALBUMIN in the last 168 hours. No results for input(s): LIPASE, AMYLASE in the last 168 hours. No results for input(s): AMMONIA in the last 168 hours.  ABG    Component Value Date/Time   PHART 7.308 (L) 06/28/2019 0311   PCO2ART 53.6 (H) 06/28/2019 0311   PO2ART 75.0 (L) 06/28/2019 0311   HCO3 26.8 06/28/2019 0311   TCO2 28 06/28/2019 0311   ACIDBASEDEF 1.5 01/30/2008 0608   O2SAT 93.0 06/28/2019 0311     Coagulation Profile: Recent Labs  Lab 06/27/19 2242  INR 1.0    Cardiac Enzymes: No results for input(s): CKTOTAL, CKMB, CKMBINDEX, TROPONINI in the last 168 hours.  HbA1C: Hgb A1c MFr  Bld  Date/Time Value Ref Range Status  06/30/2019 03:03 AM 7.8 (H) 4.8 - 5.6 % Final    Comment:    (NOTE) Pre diabetes:          5.7%-6.4% Diabetes:              >6.4% Glycemic control for   <7.0% adults with diabetes   06/21/2010 03:52 PM (H) <5.7 % Final   5.9 (NOTE)                                                                       According to the ADA Clinical Practice Recommendations for 2011, when HbA1c is used as a screening test:   >=6.5%   Diagnostic of Diabetes Mellitus           (if abnormal result  is confirmed)  5.7-6.4%   Increased risk of developing Diabetes Mellitus  References:Diagnosis and Classification of Diabetes Mellitus,Diabetes ENID,7824,23(NTIRW 1):S62-S69 and Standards of Medical Care in         Diabetes - 2011,Diabetes Care,2011,34  (Suppl 1):S11-S61.    CBG: Recent Labs  Lab 07/02/19 1153  07/02/19 1543 07/02/19 2020 07/03/19 0025 07/03/19 0417  GLUCAP 120* 96 196* 169* 170*    Review of Systems:   Patient states that she is doing better today. She denies new symptoms today.  Past Medical History  She,  has a past medical history of Arthritis, Bipolar 1 disorder ([place]), Hypertension, Obesity, Psychiatric illness, Schizo-affective schizophrenia ([place]), Seizures ([place]), and Substance abuse ([place]).   Surgical History    Past Surgical History:  Procedure Laterality Date  . STOMACH SURGERY    . TRACHEOSTOMY TUBE PLACEMENT N/A 06/28/2019   Procedure: TRACHEOSTOMY - 8DCT;  Surgeon: Izora Gala, MD;  Location: [place];  Service: ENT;  Laterality: N/A;     Social History   reports that she has been smoking cigarettes. She has been smoking about 1.00 pack per day. She has never used smokeless tobacco. She reports current alcohol use of about 6.0 standard drinks of alcohol per week. She reports current drug use. Frequency: 5.00 times per week. Drugs: "Crack" cocaine and Cocaine.   Family History   Her family history includes Alzheimer's disease in her mother; Depression in her mother.   Allergies Allergies  Allergen Reactions  . Lisinopril Swelling    Angioedema resulting in emergent trach     Home Medications  Prior to Admission medications   Medication Sig Start Date End Date Taking? Authorizing Provider  paliperidone (INVEGA SUSTENNA) 234 MG/1.5ML SUSP injection Inject 234 mg into the muscle every 30 (thirty) days.   Yes [provider]  cephALEXin (KEFLEX) 500 MG capsule 1 cap po bid x 7 days Patient not taking: Reported on 06/28/2019 06/21/19   Muthersbaugh, Jarrett Soho, PA-C  divalproex (DEPAKOTE ER) 500 MG 24 hr tablet Take 1 tablet (500 mg total) by mouth 2 (two) times daily. Patient not taking: Reported on 03/03/2019 12/28/15   Niel Hummer, NP  hydrochlorothiazide (MICROZIDE) 12.5 MG capsule Take 1 capsule (12.5 mg total) by mouth daily. Patient not taking:  Reported on 03/03/2019 12/16/18   Nonda Lou S, PA-C  paliperidone (INVEGA SUSTENNA) 156 MG/ML SUSP injection Inject 1 mL (156 mg total) into the  muscle every 30 (thirty) days. Patient not taking: Reported on 12/03/2016 12/28/15   Fransisca Kaufmann A, NP  potassium chloride (K-DUR,KLOR-CON) 10 MEQ tablet Take 1 tablet (10 mEq total) by mouth daily. Patient not taking: Reported on 03/03/2019 12/28/15   Thermon Leyland, NP  QUEtiapine (SEROQUEL) 100 MG tablet Take 100 mg by mouth at bedtime.    [provider]  risperiDONE (RISPERDAL) 2 MG tablet Take 1 tablet (2 mg total) by mouth 2 (two) times daily. Patient not taking: Reported on 03/03/2019 12/28/15   Thermon Leyland, NP  topiramate (TOPAMAX) 50 MG tablet Take 50 mg by mouth. 09/16/16   [provider]  traZODone (DESYREL) 50 MG tablet Take 50 mg by mouth. 09/16/16   [provider]  Valbenazine Tosylate (INGREZZA) 40 MG CAPS Take 1 capsule by mouth daily.    [provider]     Critical care time:      Dellia Cloud, D.O. [facility] Internal Medicine, PGY-1 Pager: [phone], Phone: [phone] Date 07/03/2019 Time 1:10 PM

## 2019-07-03 NOTE — Progress Notes (Signed)
Inpatient Rehab Admissions Coordinator Note:   Per PT/OT recommendations, pt was screened for CIR candidacy by Estill Dooms, PT, DPT.  At this time we are recommending CIR consult.  I will contact MD for order.  Please contact me with questions.   Estill Dooms, PT, DPT 07/03/19 12:22 PM

## 2019-07-03 NOTE — Progress Notes (Signed)
  Speech Language Pathology Treatment: [name] Speaking valve  Patient Details Name: [name] MRN: [id number] DOB: [birth year] Today's Date: 07/03/2019 Time: 3888-2800 SLP Time Calculation (min) (ACUTE ONLY): 8 min  Assessment / Plan / Recommendation Clinical Impression  Yesterday afternoon ENT changed trach to #6 with cuff secondary to cuffless not available at that time). Plans for change to #4 cuffless in 1-2 days with RT per ENT. On arrival her RR was low-mid 40's fluctuating down to 32. Cuff had trace amount of detectable air deflated by this therapist with constant coughing. No audible secretions or respirations and therapist suspects hypersensation to minimal secretions. SLP used valve to provide greater pressure and cough response but unsuccessful suspect given laryngeal/pharyngeal edema. Exhaled air audibly and forcefully detected when PMV doffed again suggestive of stagnant exhaled air. Her respirations continuing to wax and wane, coughing persistent and session ceased. Minimally inflated cuff to baseline. Lingual protrusion and edema continues. No po trials attempted today. Will continue efforts toward upper airway access, verbalization, pressure for swallow.     HPI HPI: 59 year old woman who presented with angioedema and airway compromise secondary to ACE inhibitor use. The decision was made to intubate the patient for airway protection and anesthesia was called for difficult airway. Unfortunately the anesthesiologist was unable to intubate the patient and she was taken emergently to OR for tracheostomy. PMH is significant for HTN, schizoaffective disorder, bipolar disorder, seizures, and (cocaine) substance abuse.       SLP Plan  Continue with current plan of care       Recommendations         Patient may use Passy-Muir Speech Valve: with SLP only PMSV Supervision: Full MD: Please consider changing trach tube to : Cuffless         Oral Care Recommendations: Oral care  BID Follow up Recommendations: Skilled Nursing facility SLP Visit Diagnosis: Aphonia (R49.1) Plan: Continue with current plan of care                       Royce Macadamia 07/03/2019, 10:41 AM   Breck Coons Lonell Face.Ed Nurse, children's [phone] Office [phone]

## 2019-07-03 NOTE — Progress Notes (Signed)
Assisted tele visit to patient with daughter.  Mary Friberg Samson, RN  

## 2019-07-04 LAB — GLUCOSE, CAPILLARY
Glucose-Capillary: 176 mg/dL — ABNORMAL HIGH (ref 70–99)
Glucose-Capillary: 154 mg/dL — ABNORMAL HIGH (ref 70–99)
Glucose-Capillary: 142 mg/dL — ABNORMAL HIGH (ref 70–99)
Glucose-Capillary: 144 mg/dL — ABNORMAL HIGH (ref 70–99)
Glucose-Capillary: 141 mg/dL — ABNORMAL HIGH (ref 70–99)
Glucose-Capillary: 123 mg/dL — ABNORMAL HIGH (ref 70–99)

## 2019-07-04 NOTE — Progress Notes (Addendum)
Inpatient Rehabilitation Admissions Coordinator  I spoke with Okey Regal, SW to clarify prior dispo. Patient from group home. I will follow her progress with therapy but am concerned with final dispo. At group home she would have to be Independent and likely without trach to return there. May need SNF rehab for prolonged recovery .  Ottie Glazier, RN, MSN Rehab Admissions Coordinator [phone] 07/04/2019 11:20 AM

## 2019-07-04 NOTE — Progress Notes (Signed)
CSW made additional attempt to reach [facility], who is listed as the owner of the group on the patient's facesheet without success - a voicemail was left requesting a return call.  Edwin Dada, MSW, LCSW-A Transitions of Care  Clinical Social Worker  [facility] Emergency Departments  Medical ICU [phone]

## 2019-07-04 NOTE — Progress Notes (Signed)
Patient requested I call her social worker, Jens Som [phone]) and he said she was due a dose of  [place] on Saturday and requested we consult the doctor here about this. The outpatient RN who usually administers this has the medication if it is needed and can be contacted via Jens Som at the above number.

## 2019-07-04 NOTE — Progress Notes (Signed)
Assisted tele visit to patient with daughter.  Kimbra Marcelino M Lewanda Perea, RN   

## 2019-07-04 NOTE — Progress Notes (Signed)
Physical Therapy Treatment Patient Details Name: [name] MRN: [id number] DOB: 01-16-1961 Today's Date: 07/04/2019    History of Present Illness Pt is a 59 year old woman who presented with angioedema and airway compromise secondary to ACE inhibitor use. The decision was made to intubate the patient for airway protection and anesthesia was called for difficult airway. Unfortunately the anesthesiologist was unable to intubate the patient and she was taken emergently to OR for tracheostomy. PMH is significant for HTN, schizoaffective disorder, bipolar disorder, seizures, and (cocaine) substance abuse.      PT Comments    Pt awaken and agreeable to moving this pm.  Once up, she was more reluctant to get OOB, but needed to "go to the bathroom" so this encouraged her up.  Emphasis on transition to EOB, sitting balance, sit to stand and transfer to/from [facility].  Pt deferred trying pre gait to ambulation.   Follow Up Recommendations  CIR;Supervision/Assistance - 24 hour     Equipment Recommendations  Other (comment)(TBA)    Recommendations for Other Services       Precautions / Restrictions Precautions Precautions: Fall Precaution Comments: NG    Mobility  Bed Mobility Overal bed mobility: Needs Assistance Bed Mobility: Supine to Sit;Sit to Supine     Supine to sit: Min assist Sit to supine: Min guard      Transfers Overall transfer level: Needs assistance Equipment used: Rolling walker (2 wheeled);1 person hand held assist Transfers: Sit to/from [employer] Sit to Stand: Min assist;+2 physical assistance;+2 safety/equipment Stand pivot transfers: Min assist;+2 safety/equipment       General transfer comment: pt able to stand without assist.  pt is impulsive enough that min assist is warranted for safety.  pt reluctantly moved to the [facility] and back to the bed only after need to use the bathroom.  Ambulation/Gait                 Stairs              Wheelchair Mobility    Modified Rankin (Stroke Patients Only)       Balance Overall balance assessment: Needs assistance   Sitting balance-Leahy Scale: Fair       Standing balance-Leahy Scale: Poor Standing balance comment: holds to stationary object during standing.                            Cognition Arousal/Alertness: Awake/alert Behavior During Therapy: WFL for tasks assessed/performed Overall Cognitive Status: Difficult to assess(NT formally)                                 General Comments: A little impulsive, not fully safety aware.      Exercises Other Exercises Other Exercises: warm up ROM exercise to B LE    General Comments General comments (skin integrity, edema, etc.): vss, trach on RA      Pertinent Vitals/Pain Pain Assessment: Faces Faces Pain Scale: No hurt Pain Intervention(s): Monitored during session    Home Living                      Prior Function            PT Goals (current goals can now be found in the care plan section) Acute Rehab PT Goals Patient Stated Goal: none stated, agreeable to working with therapy Time For Goal Achievement: 07/16/19  Potential to Achieve Goals: Fair Progress towards PT goals: Progressing toward goals    Frequency    Min 3X/week      PT Plan Current plan remains appropriate    Co-evaluation              AM-PAC PT "6 Clicks" Mobility   Outcome Measure  Help needed turning from your back to your side while in a flat bed without using bedrails?: A Little Help needed moving from lying on your back to sitting on the side of a flat bed without using bedrails?: A Little Help needed moving to and from a bed to a chair (including a wheelchair)?: A Little   Help needed to walk in hospital room?: A Lot Help needed climbing 3-5 steps with a railing? : A Lot 6 Click Score: 13    End of Session   Activity Tolerance: Patient tolerated treatment well Patient  left: in bed;with call bell/phone within reach;with bed alarm set;with SCD's reapplied Nurse Communication: Mobility status PT Visit Diagnosis: Other abnormalities of gait and mobility (R26.89);Pain;Difficulty in walking, not elsewhere classified (R26.2)     Time: 9628-3662 PT Time Calculation (min) (ACUTE ONLY): 23 min  Charges:  $Therapeutic Activity: 23-37 mins                     07/04/2019  Ginger Carne., PT Acute Rehabilitation Services [phone]  (pager) [phone]  (office)   Tessie Fass Rhylen Pulido 07/04/2019, 4:58 PM

## 2019-07-04 NOTE — Progress Notes (Signed)
PROGRESS NOTE    [name]  ZOX:[id number] DOB: 1961/02/04 DOA: 06/27/2019 PCP: Medicine, [facility]    Brief Narrative:  59yo with hx bipolar 1 do, HTN, schizoaffective disorder, seizures who presented with angioedema, presumed from ACEI. Pt required emergent tracheostomy and admitted to ICU. Now on trach collar and transferred to [facility] service as of 4/22  Assessment & Plan:   Active Problems:   Angioedema   Acute respiratory failure (HCC)   Tracheostomy status (HCC)  Acute hypoxemic respiratory failure s/p emergent trach Angioedema, with airway compromise.  -Suspect related to ACE-inhibitor vs cocaine use as UDS was pos for cocaine as well as benzos - Lisinopril added to allergy list.  -Continue famotidine and benadryl -Therapy recs for CIR. Seen by Inpt rehab who recommends SNF  Hyperglycemia: Improved - No Hx of Diabetes, A1cnow 7.8 - TF coverage - Cont SSI as needed  HTN -Suboptimally controlled - Holding lisinopril per above - Cont on HCTZ 12.3m Daily with labetalol 1070mBID - May add Ca channel blocker if needed  Schizoaffective disorder Bipolar disorder Seizure history -Per report, [facility] D/[facility] July 2020 was taking: Cogentin 0.42m53mQuetiapine 100m88mepakote 500mg542mpiramate 50mg,442m Invega sustenna (injection).Pharmacy reports she has only filled Haldol - Continue Quetiapine - cont Haldol PRN - PRN Klonopin -Precedex stopped as of 4/20  DVT prophylaxis: Lovenox subq Code Status: Full Family Communication: Pt in room, family not at bedside  Status is: Inpatient  Remains inpatient appropriate because:Inpatient level of care appropriate due to severity of illness   Dispo: The patient is from: Home              Anticipated d/c is to: SNF              Anticipated d/c date is: 3 days              Patient currently is not medically stable to d/c.        Consultants:   PCCM  Procedures:      Antimicrobials: Anti-infectives (From admission, onward)   None       Subjective: Unable to speak given trach  Objective: Vitals:   07/04/19 1400 07/04/19 1500 07/04/19 1520 07/04/19 1600  BP: 139/76 (!) 158/85 (!) 158/85   Pulse: (!) 102 (!) 104 (!) 102   Resp: (!) 26 (!) 28 (!) 26   Temp:    99.2 F (37.3 C)  TempSrc:    Oral  SpO2: 94% 99% 97%   Weight:        Intake/Output Summary (Last 24 hours) at 07/04/2019 1917 Last data filed at 07/04/2019 1800 Gross per 24 hour  Intake 1270 ml  Output 1375 ml  Net -105 ml   Filed Weights   07/01/19 0500 07/02/19 0424 07/03/19 0500  Weight: 114.7 kg 113 kg 113 kg    Examination:  General exam: Appears calm and comfortable  Respiratory system: Clear to auscultation. Respiratory effort normal. Trach in place Cardiovascular system: S1 & S2 heard, Regular Gastrointestinal system: Abdomen is nondistended, soft and nontender. No organomegaly or masses felt. Normal bowel sounds heard. Central nervous system: Alert and oriented. No focal neurological deficits. Extremities: Symmetric 5 x 5 power. Skin: No rashes, lesions Psychiatry: Unable to assess as pt has trach.   Data Reviewed: I have personally reviewed following labs and imaging studies  CBC: Recent Labs  Lab 06/27/19 2242 06/28/19 0311 06/29/19 0403 06/30/19 0303 07/01/19 0302 07/02/19 0316 07/03/19 0654  WBC 10.6*   < >  8.0 11.8* 12.2* 13.0* 10.5  NEUTROABS 6.6  --   --   --   --   --   --   HGB 13.8   < > 12.4 13.9 13.3 12.8 12.0  HCT 43.1   < > 38.3 43.5 42.0 41.1 38.3  MCV 90.0   < > 88.7 89.0 91.7 92.2 92.1  PLT 289   < > 212 231 226 203 219   < > = values in this interval not displayed.   Basic Metabolic Panel: Recent Labs  Lab 06/29/19 0403 06/30/19 0303 07/01/19 0302 07/02/19 0316 07/03/19 0654  NA 135 143 146* 147* 144  K 4.6 3.9 4.2 4.0 3.9  CL 99 103 104 107 104  CO2 _0 GLUCOSE 228* 111* 137* 139* 141*  BUN 25* 37*  33* 27* 21*  CREATININE 1.11* 0.82 0.78 0.71 0.76  CALCIUM 8.9 9.7 9.6 9.3 8.7*  MG 2.3 2.1 2.1 2.2 2.3  PHOS 3.8 3.3 4.3 4.0 3.3   GFR: Estimated Creatinine Clearance: 100.7 mL/min (by C-G formula based on SCr of 0.76 mg/dL). Liver Function Tests: No results for input(s): AST, ALT, ALKPHOS, BILITOT, PROT, ALBUMIN in the last 168 hours. No results for input(s): LIPASE, AMYLASE in the last 168 hours. No results for input(s): AMMONIA in the last 168 hours. Coagulation Profile: Recent Labs  Lab 06/27/19 2242  INR 1.0   Cardiac Enzymes: No results for input(s): CKTOTAL, CKMB, CKMBINDEX, TROPONINI in the last 168 hours. BNP (last 3 results) No results for input(s): PROBNP in the last 8760 hours. HbA1C: No results for input(s): HGBA1C in the last 72 hours. CBG: Recent Labs  Lab 07/03/19 2326 07/04/19 0329 07/04/19 0807 07/04/19 1123 07/04/19 1524  GLUCAP 172* 176* 154* 142* 144*   Lipid Profile: No results for input(s): CHOL, HDL, LDLCALC, TRIG, CHOLHDL, LDLDIRECT in the last 72 hours. Thyroid Function Tests: No results for input(s): TSH, T4TOTAL, FREET4, T3FREE, THYROIDAB in the last 72 hours. Anemia Panel: No results for input(s): VITAMINB12, FOLATE, FERRITIN, TIBC, IRON, RETICCTPCT in the last 72 hours. Sepsis Labs: Recent Labs  Lab 06/28/19 0330 06/29/19 0403  PROCALCITON <0.10 <0.10    Recent Results (from the past 240 hour(s))  Respiratory Panel by RT PCR (Flu A&B, Covid) - Nasopharyngeal Swab     Status: None   Collection Time: 06/27/19 10:49 PM   Specimen: Nasopharyngeal Swab  Result Value Ref Range Status   SARS Coronavirus 2 by RT PCR NEGATIVE NEGATIVE Final    Comment: (NOTE) SARS-CoV-2 target nucleic acids are NOT DETECTED. The SARS-CoV-2 RNA is generally detectable in upper respiratoy specimens during the acute phase of infection. The lowest concentration of SARS-CoV-2 viral copies this assay can detect is 131 copies/mL. A negative result does not  preclude SARS-Cov-2 infection and should not be used as the sole basis for treatment or other patient management decisions. A negative result may occur with  improper specimen collection/handling, submission of specimen other than nasopharyngeal swab, presence of viral mutation(s) within the areas targeted by this assay, and inadequate number of viral copies (<131 copies/mL). A negative result must be combined with clinical observations, patient history, and epidemiological information. The expected result is Negative. Fact Sheet for Patients:  PinkCheek.be Fact Sheet for Healthcare Providers:  GravelBags.it This test is not yet ap proved or cleared by the [place] FDA and  has been authorized for detection and/or diagnosis of SARS-CoV-2 by FDA under an Emergency Use Authorization (EUA). This EUA  will remain  in effect (meaning this test can be used) for the duration of the COVID-19 declaration under Section 564(b)(1) of the Act, 21 U.S.C. section 360bbb-3(b)(1), unless the authorization is terminated or revoked sooner.    Influenza A by PCR NEGATIVE NEGATIVE Final   Influenza B by PCR NEGATIVE NEGATIVE Final    Comment: (NOTE) The Xpert Xpress SARS-CoV-2/FLU/RSV assay is intended as an aid in  the diagnosis of influenza from Nasopharyngeal swab specimens and  should not be used as a sole basis for treatment. Nasal washings and  aspirates are unacceptable for Xpert Xpress SARS-CoV-2/FLU/RSV  testing. Fact Sheet for Patients: PinkCheek.be Fact Sheet for Healthcare Providers: GravelBags.it This test is not yet approved or cleared by the [place] FDA and  has been authorized for detection and/or diagnosis of SARS-CoV-2 by  FDA under an Emergency Use Authorization (EUA). This EUA will remain  in effect (meaning this test can be used) for the duration of the   Covid-19 declaration under Section 564(b)(1) of the Act, 21  U.S.C. section 360bbb-3(b)(1), unless the authorization is  terminated or revoked. Performed at [facility], [place] [address], [place], [place] [address]   MRSA PCR Screening     Status: None   Collection Time: 06/28/19  2:22 AM   Specimen: Nasopharyngeal  Result Value Ref Range Status   MRSA by PCR NEGATIVE NEGATIVE Final    Comment:        The GeneXpert MRSA Assay (FDA approved for NASAL specimens only), is one component of a comprehensive MRSA colonization surveillance program. It is not intended to diagnose MRSA infection nor to guide or monitor treatment for MRSA infections. Performed at [facility], [place] [address] [place], [place] [address]      Radiology Studies: No results found.  Scheduled Meds: . chlorhexidine gluconate (MEDLINE KIT)  15 mL Mouth Rinse BID  . Chlorhexidine Gluconate Cloth  6 each Topical Daily  . enoxaparin (LOVENOX) injection  40 mg Subcutaneous Q24H  . free water  200 mL Per Tube Q6H  . hydrochlorothiazide  13 mg Per Tube Daily  . insulin aspart  3-9 Units Subcutaneous Q4H  . insulin aspart  6 Units Subcutaneous Q4H  . labetalol  100 mg Per Tube BID  . mouth rinse  15 mL Mouth Rinse 10 times per day  . multivitamin with minerals  1 tablet Per Tube Daily  . QUEtiapine  100 mg Per Tube QHS   Continuous Infusions: . sodium chloride Stopped (06/28/19 1431)  . famotidine (PEPCID) IV 20 mg (07/04/19 0917)  . feeding supplement (OSMOLITE 1.5 CAL) 1,000 mL (07/04/19 1045)     LOS: 6 days   Marylu Lund, MD [facility] Pager On Amion  If 7PM-7AM, please contact night-coverage 07/04/2019, 7:17 PM

## 2019-07-05 DIAGNOSIS — Z930 Tracheostomy status: Secondary | ICD-10-CM | POA: Diagnosis not present

## 2019-07-05 DIAGNOSIS — J9600 Acute respiratory failure, unspecified whether with hypoxia or hypercapnia: Secondary | ICD-10-CM | POA: Diagnosis not present

## 2019-07-05 LAB — CBC
HCT: 36.7 % (ref 36.0–46.0)
Hemoglobin: 11.3 g/dL — ABNORMAL LOW (ref 12.0–15.0)
MCH: 28.7 pg (ref 26.0–34.0)
MCHC: 30.8 g/dL (ref 30.0–36.0)
MCV: 93.1 fL (ref 80.0–100.0)
Platelets: 249 10*3/uL (ref 150–400)
RBC: 3.94 MIL/uL (ref 3.87–5.11)
RDW: 13.7 % (ref 11.5–15.5)
WBC: 7.5 10*3/uL (ref 4.0–10.5)
nRBC: 0 % (ref 0.0–0.2)

## 2019-07-05 LAB — COMPREHENSIVE METABOLIC PANEL
ALT: 72 U/L — ABNORMAL HIGH (ref 0–44)
AST: 31 U/L (ref 15–41)
Albumin: 2.4 g/dL — ABNORMAL LOW (ref 3.5–5.0)
Alkaline Phosphatase: 74 U/L (ref 38–126)
Anion gap: 10 (ref 5–15)
BUN: 10 mg/dL (ref 6–20)
CO2: 30 mmol/L (ref 22–32)
Calcium: 8.8 mg/dL — ABNORMAL LOW (ref 8.9–10.3)
Chloride: 99 mmol/L (ref 98–111)
Creatinine, Ser: 0.67 mg/dL (ref 0.44–1.00)
GFR calc Af Amer: >60 mL/min (ref >60)
GFR calc non Af Amer: >60 mL/min (ref >60)
Glucose, Bld: 182 mg/dL — ABNORMAL HIGH (ref 70–99)
Potassium: 4.1 mmol/L (ref 3.5–5.1)
Sodium: 139 mmol/L (ref 135–145)
Total Bilirubin: 0.3 mg/dL (ref 0.3–1.2)
Total Protein: 6.1 g/dL — ABNORMAL LOW (ref 6.5–8.1)

## 2019-07-05 LAB — GLUCOSE, CAPILLARY
Glucose-Capillary: 123 mg/dL — ABNORMAL HIGH (ref 70–99)
Glucose-Capillary: 145 mg/dL — ABNORMAL HIGH (ref 70–99)
Glucose-Capillary: 146 mg/dL — ABNORMAL HIGH (ref 70–99)
Glucose-Capillary: 153 mg/dL — ABNORMAL HIGH (ref 70–99)
Glucose-Capillary: 168 mg/dL — ABNORMAL HIGH (ref 70–99)
Glucose-Capillary: 97 mg/dL (ref 70–99)

## 2019-07-05 MED ORDER — PALIPERIDONE PALMITATE ER 234 MG/1.5ML IM SUSY
234.0000 mg | PREFILLED_SYRINGE | Freq: Once | INTRAMUSCULAR | Status: DC
  Filled 2019-07-05: qty 1.5

## 2019-07-05 MED ORDER — PALIPERIDONE PALMITATE ER 234 MG/1.5ML IM SUSY: 234.0000 mg | PREFILLED_SYRINGE | Freq: Once | INTRAMUSCULAR | Status: DC

## 2019-07-05 MED ORDER — FAMOTIDINE 40 MG/5ML PO SUSR
20.0000 mg | Freq: Two times a day (BID) | ORAL | Status: DC
  Administered 2019-07-05 – 2019-07-08 (×6): 20 mg
  Filled 2019-07-05 (×10): qty 2.5

## 2019-07-05 NOTE — Progress Notes (Signed)
Physical Therapy Treatment Patient Details Name: [name] MRN: [id number] DOB: 03/30/1960 Today's Date: 07/05/2019    History of Present Illness Pt is a 59 year old woman who presented with angioedema and airway compromise secondary to ACE inhibitor use. The decision was made to intubate the patient for airway protection and anesthesia was called for difficult airway. Unfortunately the anesthesiologist was unable to intubate the patient and she was taken emergently to OR for tracheostomy. PMH is significant for HTN, schizoaffective disorder, bipolar disorder, seizures, and (cocaine) substance abuse.      PT Comments    Pt was eager to get up.  She agreed to try amb in the RW.  She needed consistent cues to continue standing and progressing forward, o/w pt just sits and I am not sure she is aware if the bed/chair is directly behind her or not.  Emphasis on transition to EOB, sitting balance, sit to stand and progressing gait with the RW.    Follow Up Recommendations  CIR;Supervision/Assistance - 24 hour     Equipment Recommendations  Other (comment)(TBA)    Recommendations for Other Services       Precautions / Restrictions Precautions Precautions: Fall Precaution Comments: NG    Mobility  Bed Mobility Overal bed mobility: Needs Assistance Bed Mobility: Supine to Sit     Supine to sit: Min guard     General bed mobility comments: cues for direction, more for waiting until therapist is ready for safety.  Transfers Overall transfer level: Needs assistance Equipment used: Rolling walker (2 wheeled);1 person hand held assist Transfers: Sit to/from [employer] Sit to Stand: Min assist Stand pivot transfers: Min assist;+2 safety/equipment       General transfer comment: pt needs cuing to remember that there is no seating surface close by.  stability assist  Ambulation/Gait Ambulation/Gait assistance: Min assist Gait Distance (Feet): 5  Feet(forward/back, then 10 feet around the room and back) Assistive device: Rolling walker (2 wheeled) Gait Pattern/deviations: Step-through pattern   Gait velocity interpretation: <1.31 ft/sec, indicative of household ambulator General Gait Details: mildly unsteady with poor maneurvering of the RW, quick to fatigue and sit quickly unless cued to Not Sit, but then the pt is unfocused on task for the remainder of the activity.   Stairs             Wheelchair Mobility    Modified Rankin (Stroke Patients Only)       Balance Overall balance assessment: Needs assistance Sitting-balance support: Feet supported;Single extremity supported;No upper extremity supported Sitting balance-Leahy Scale: Fair Sitting balance - Comments: dynamically steady in sitting.   Standing balance support: Single extremity supported;No upper extremity supported;During functional activity Standing balance-Leahy Scale: Fair Standing balance comment: can release and maintain standing if focused.                            Cognition Arousal/Alertness: Awake/alert Behavior During Therapy: WFL for tasks assessed/performed;Impulsive Overall Cognitive Status: Difficult to assess                                 General Comments: A little impulsive, not fully safety aware.      Exercises Other Exercises Other Exercises: warm up ROM exercise to B LE    General Comments        Pertinent Vitals/Pain Pain Assessment: Faces Faces Pain Scale: No hurt Pain Intervention(s): Monitored during session  Home Living                      Prior Function            PT Goals (current goals can now be found in the care plan section) Acute Rehab PT Goals PT Goal Formulation: With patient Time For Goal Achievement: 07/16/19 Potential to Achieve Goals: Fair Progress towards PT goals: Progressing toward goals    Frequency    Min 3X/week      PT Plan Current plan  remains appropriate    Co-evaluation              AM-PAC PT "6 Clicks" Mobility   Outcome Measure  Help needed turning from your back to your side while in a flat bed without using bedrails?: A Little Help needed moving from lying on your back to sitting on the side of a flat bed without using bedrails?: A Little Help needed moving to and from a bed to a chair (including a wheelchair)?: A Little Help needed standing up from a chair using your arms (e.g., wheelchair or bedside chair)?: A Little Help needed to walk in hospital room?: A Lot Help needed climbing 3-5 steps with a railing? : A Lot 6 Click Score: 16    End of Session   Activity Tolerance: Patient tolerated treatment well Patient left: in chair;with call bell/phone within reach;with chair alarm set Nurse Communication: Mobility status PT Visit Diagnosis: Other abnormalities of gait and mobility (R26.89);Pain;Difficulty in walking, not elsewhere classified (R26.2)     Time: 9675-9163 PT Time Calculation (min) (ACUTE ONLY): 17 min  Charges:  $Gait Training: 8-22 mins                     07/05/2019  Ginger Carne., PT Acute Rehabilitation Services [phone]  (pager) [phone]  (office)   Tessie Fass Jerrye Seebeck 07/05/2019, 1:08 PM

## 2019-07-05 NOTE — Progress Notes (Signed)
PROGRESS NOTE    [name]  ZOX:[id number] DOB: Dec 23, 1960 DOA: 06/27/2019 PCP: Medicine, [facility]    Brief Narrative:  59yo with hx bipolar 1 do, HTN, schizoaffective disorder, seizures who presented with angioedema, presumed from ACEI. Pt required emergent tracheostomy and admitted to ICU. Now on trach collar and transferred to [facility] service as of 4/22  Assessment & Plan:   Active Problems:   Angioedema   Acute respiratory failure (HCC)   Tracheostomy status (HCC)  Acute hypoxemic respiratory failure s/p emergent trach Angioedema, with airway compromise.  -Suspect related to ACE-inhibitor vs cocaine use as UDS was pos for cocaine as well as benzos - Lisinopril added to allergy list.  -Continue famotidine and benadryl -Therapy recs for CIR were noted. Seen by Inpt rehab who had suggested looking into SNF given trach  Hyperglycemia: Improved - No Hx of Diabetes, A1cnow 7.8 - TF coverage - Will continue with SSI as needed  HTN - Holding lisinopril per above - Cont on HCTZ 12.'5mg'$  Daily with labetalol '100mg'$  BID - BP seems better controlled  Schizoaffective disorder Bipolar disorder Seizure history -Per report, [facility] D/[facility] July 2020 was taking: Cogentin 0.'5mg'$ , Quetiapine '100mg'$ , Depakote '500mg'$ , Topiramate '50mg'$ , and Invega sustenna (injection).Pharmacy reports she has only filled Haldol - Continue Quetiapine - cont Haldol PRN - PRN Klonopin -Precedex stopped as of 4/20 -On Invega q 30 days prior to admit  DVT prophylaxis: Lovenox subq Code Status: Full Family Communication: Pt in room, family not at bedside  Status is: Inpatient  Remains inpatient appropriate because:Inpatient level of care appropriate due to severity of illness   Dispo: The patient is from: Home              Anticipated d/c is to: SNF              Anticipated d/c date is: 3 days              Patient currently is not medically stable to d/c.   Consultants:    PCCM  Procedures:   Trach placement  Antimicrobials: Anti-infectives (From admission, onward)   None      Subjective: Cannot speak, however using nonverbal communication, is eager to go home soon  Objective: Vitals:   07/05/19 1120 07/05/19 1215 07/05/19 1300 07/05/19 1400  BP:   122/70   Pulse: 93  88   Resp: 20  (!) 21 (!) 29  Temp:  98.9 F (37.2 C)    TempSrc:  Oral    SpO2: 98% 98% 94% 96%  Weight:        Intake/Output Summary (Last 24 hours) at 07/05/2019 1430 Last data filed at 07/05/2019 1412 Gross per 24 hour  Intake 1951 ml  Output 975 ml  Net 976 ml   Filed Weights   07/02/19 0424 07/03/19 0500 07/05/19 0418  Weight: 113 kg 113 kg 114.8 kg    Examination: General exam: Awake, laying in bed, in nad Respiratory system: Normal respiratory effort, no wheezing, trach in place Cardiovascular system: regular rate, s1, s2 Gastrointestinal system: Soft, nondistended, positive BS Central nervous system: CN2-12 grossly intact, strength intact Extremities: Perfused, no clubbing Skin: Normal skin turgor, no notable skin lesions seen Psychiatry: Mood normal // affect seems normal   Data Reviewed: I have personally reviewed following labs and imaging studies  CBC: Recent Labs  Lab 06/30/19 0303 07/01/19 0302 07/02/19 0316 07/03/19 0654 07/05/19 0322  WBC 11.8* 12.2* 13.0* 10.5 7.5  HGB 13.9 13.3 12.8 12.0  11.3*  HCT 43.5 42.0 41.1 38.3 36.7  MCV 89.0 91.7 92.2 92.1 93.1  PLT 231 226 203 219 093   Basic Metabolic Panel: Recent Labs  Lab 06/29/19 0403 06/29/19 0403 06/30/19 0303 07/01/19 0302 07/02/19 0316 07/03/19 0654 07/05/19 0322  NA 135   < > 143 146* 147* 144 139  K 4.6   < > 3.9 4.2 4.0 3.9 4.1  CL 99   < > 103 104 107 104 99  CO2 26   < > '29 27 31 31 30  '$ GLUCOSE 228*   < > 111* 137* 139* 141* 182*  BUN 25*   < > 37* 33* 27* 21* 10  CREATININE 1.11*   < > 0.82 0.78 0.71 0.76 0.67  CALCIUM 8.9   < > 9.7 9.6 9.3 8.7* 8.8*  MG 2.3   --  2.1 2.1 2.2 2.3  --   PHOS 3.8  --  3.3 4.3 4.0 3.3  --    < > = values in this interval not displayed.   GFR: Estimated Creatinine Clearance: 101.5 mL/min (by C-G formula based on SCr of 0.67 mg/dL). Liver Function Tests: Recent Labs  Lab 07/05/19 0322  AST 31  ALT 72*  ALKPHOS 74  BILITOT 0.3  PROT 6.1*  ALBUMIN 2.4*   No results for input(s): LIPASE, AMYLASE in the last 168 hours. No results for input(s): AMMONIA in the last 168 hours. Coagulation Profile: No results for input(s): INR, PROTIME in the last 168 hours. Cardiac Enzymes: No results for input(s): CKTOTAL, CKMB, CKMBINDEX, TROPONINI in the last 168 hours. BNP (last 3 results) No results for input(s): PROBNP in the last 8760 hours. HbA1C: No results for input(s): HGBA1C in the last 72 hours. CBG: Recent Labs  Lab 07/04/19 1932 07/04/19 2329 07/05/19 0332 07/05/19 0804 07/05/19 1219  GLUCAP 141* 123* 168* 153* 123*   Lipid Profile: No results for input(s): CHOL, HDL, LDLCALC, TRIG, CHOLHDL, LDLDIRECT in the last 72 hours. Thyroid Function Tests: No results for input(s): TSH, T4TOTAL, FREET4, T3FREE, THYROIDAB in the last 72 hours. Anemia Panel: No results for input(s): VITAMINB12, FOLATE, FERRITIN, TIBC, IRON, RETICCTPCT in the last 72 hours. Sepsis Labs: Recent Labs  Lab 06/29/19 0403  PROCALCITON <0.10    Recent Results (from the past 240 hour(s))  Respiratory Panel by RT PCR (Flu A&B, Covid) - Nasopharyngeal Swab     Status: None   Collection Time: 06/27/19 10:49 PM   Specimen: Nasopharyngeal Swab  Result Value Ref Range Status   SARS Coronavirus 2 by RT PCR NEGATIVE NEGATIVE Final    Comment: (NOTE) SARS-CoV-2 target nucleic acids are NOT DETECTED. The SARS-CoV-2 RNA is generally detectable in upper respiratoy specimens during the acute phase of infection. The lowest concentration of SARS-CoV-2 viral copies this assay can detect is 131 copies/mL. A negative result does not preclude  SARS-Cov-2 infection and should not be used as the sole basis for treatment or other patient management decisions. A negative result may occur with  improper specimen collection/handling, submission of specimen other than nasopharyngeal swab, presence of viral mutation(s) within the areas targeted by this assay, and inadequate number of viral copies (<131 copies/mL). A negative result must be combined with clinical observations, patient history, and epidemiological information. The expected result is Negative. Fact Sheet for Patients:  PinkCheek.be Fact Sheet for Healthcare Providers:  GravelBags.it This test is not yet ap proved or cleared by the [place] FDA and  has been authorized for detection and/or diagnosis  of SARS-CoV-2 by FDA under an Emergency Use Authorization (EUA). This EUA will remain  in effect (meaning this test can be used) for the duration of the COVID-19 declaration under Section 564(b)(1) of the Act, 21 U.S.C. section 360bbb-3(b)(1), unless the authorization is terminated or revoked sooner.    Influenza A by PCR NEGATIVE NEGATIVE Final   Influenza B by PCR NEGATIVE NEGATIVE Final    Comment: (NOTE) The Xpert Xpress SARS-CoV-2/FLU/RSV assay is intended as an aid in  the diagnosis of influenza from Nasopharyngeal swab specimens and  should not be used as a sole basis for treatment. Nasal washings and  aspirates are unacceptable for Xpert Xpress SARS-CoV-2/FLU/RSV  testing. Fact Sheet for Patients: PinkCheek.be Fact Sheet for Healthcare Providers: GravelBags.it This test is not yet approved or cleared by the [place] FDA and  has been authorized for detection and/or diagnosis of SARS-CoV-2 by  FDA under an Emergency Use Authorization (EUA). This EUA will remain  in effect (meaning this test can be used) for the duration of the  Covid-19  declaration under Section 564(b)(1) of the Act, 21  U.S.C. section 360bbb-3(b)(1), unless the authorization is  terminated or revoked. Performed at [facility], [place] [address], [place], [place] [address]   MRSA PCR Screening     Status: None   Collection Time: 06/28/19  2:22 AM   Specimen: Nasopharyngeal  Result Value Ref Range Status   MRSA by PCR NEGATIVE NEGATIVE Final    Comment:        The GeneXpert MRSA Assay (FDA approved for NASAL specimens only), is one component of a comprehensive MRSA colonization surveillance program. It is not intended to diagnose MRSA infection nor to guide or monitor treatment for MRSA infections. Performed at [facility], [place] [address] [place], [place] [address]      Radiology Studies: No results found.  Scheduled Meds: . chlorhexidine gluconate (MEDLINE KIT)  15 mL Mouth Rinse BID  . Chlorhexidine Gluconate Cloth  6 each Topical Daily  . enoxaparin (LOVENOX) injection  40 mg Subcutaneous Q24H  . free water  200 mL Per Tube Q6H  . hydrochlorothiazide  13 mg Per Tube Daily  . insulin aspart  3-9 Units Subcutaneous Q4H  . insulin aspart  6 Units Subcutaneous Q4H  . labetalol  100 mg Per Tube BID  . mouth rinse  15 mL Mouth Rinse 10 times per day  . multivitamin with minerals  1 tablet Per Tube Daily  . [START ON 07/06/2019] paliperidone  234 mg Intramuscular Once  . QUEtiapine  100 mg Per Tube QHS   Continuous Infusions: . sodium chloride Stopped (06/28/19 1431)  . famotidine (PEPCID) IV Stopped (07/05/19 1107)  . feeding supplement (OSMOLITE 1.5 CAL) 55 mL/hr at 07/05/19 1412     LOS: 7 days   Marylu Lund, MD [facility] Pager On Amion  If 7PM-7AM, please contact night-coverage 07/05/2019, 2:30 PM

## 2019-07-05 NOTE — Progress Notes (Signed)
  Speech Language Pathology Treatment: Dysphagia;Passy Muir Speaking valve  Patient Details Name: [name] MRN: [id number] DOB: 21-Dec-1960 Today's Date: 07/05/2019 Time: 8338-2505 SLP Time Calculation (min) (ACUTE ONLY): 27 min  Assessment / Plan / Recommendation Clinical Impression  Pt seen up in chair following PT session mouthing words and asking to eat. SLP ensured deflated cuff and placed valve with immediate resistance from exhalation pushing valve off. Tolerated only 1 breath cycle during multiple attempts. She was unable to produce phonation when requested nor adequate glottal closure required for cough. Valve is nonfunctional currently. Switching to cuffless trach may assist however suspect laryngeal/pharyngeal edema most impacts access to upper airway.   Minimal po trials with ice chip trials, 1/2 teaspoon water and 3 bites applesauce provided mostly for comfort and to prevent disuse atrophy. Delayed coughs following trials, mostly after water. Recommended to RN pt can have ice chips after oral care and with full supervision (2-4 every 4-5 hours if desired). Mild audible pharyngeal congestion. Recommend instrumental assessment when she is able to some degree,clear audible pharyngeal secretions (can do instrumental without speaking valve if needed). Will plan to follow up beginning of next week.    HPI HPI: 59 year old woman who presented with angioedema and airway compromise secondary to ACE inhibitor use. The decision was made to intubate the patient for airway protection and anesthesia was called for difficult airway. Unfortunately the anesthesiologist was unable to intubate the patient and she was taken emergently to OR for tracheostomy. PMH is significant for HTN, schizoaffective disorder, bipolar disorder, seizures, and (cocaine) substance abuse.       SLP Plan  Continue with current plan of care       Recommendations  Diet recommendations: Other(comment)(several ice chips  every 3 hours ) Medication Administration: Via alternative means      Patient may use Passy-Muir Speech Valve: with SLP only PMSV Supervision: Full MD: Please consider changing trach tube to : Cuffless         Oral Care Recommendations: Oral care QID Follow up Recommendations: Skilled Nursing facility SLP Visit Diagnosis: Aphonia (R49.1);Dysphagia, unspecified (R13.10) Plan: Continue with current plan of care                       Royce Macadamia 07/05/2019, 2:28 PM  Breck Coons Lonell Face.Ed Nurse, children's [phone] Office [phone]

## 2019-07-05 NOTE — Consult Note (Signed)
NAME:  [name], MRN:  [id number], DOB:  05-Nov-1960, LOS: 7 ADMISSION DATE:  06/27/2019, CONSULTATION DATE:  4/16 REFERRING MD:  Dr. Charm Barges, CHIEF COMPLAINT:  Angioedema   Brief History   59 year old female presented with angioedema and airway compromise. Unable to be intubated and ED and was taken for emergent tracheostomy. PCCM will follow as consult 2 x weekly for trach   History of present illness   Patient is encephalopathic and/or intubated. Therefore history has been obtained from chart review. 59 year old female with PMH as below, which is significant for HTN, schizoaffective disorder, bipolar disorder, seizures, and (cocaine) substance abuse. She presented to [facility] ED in the late PM hours of 4/15 with complaints of face and throat swelling. On initial assessment she was able to speak a few garbled words at a time. The decision was made to intubate the patient for airway protection and anesthesia was called for difficult airway. Unfortunately the anesthesiologist was unable to intubate the patient and she was taken emergently to OR for tracheostomy. PCCM originally cared for the patient in the acute phase of her illness. We will now follow as consult for trach management twice a week.   Past Medical History   has a past medical history of Arthritis, Bipolar 1 disorder (HCC), Hypertension, Obesity, Psychiatric illness, Schizo-affective schizophrenia (HCC), Seizures (HCC), and Substance abuse (HCC).   Significant Hospital Events   4/15 Admission 4/22 [facility] to [facility]  Consults:  Anesthesia   Procedures:  Tracheostomy 4/16 > 4/20 Trach down sized to #6 cuffed  Significant Diagnostic Tests:    Micro Data:  4/15 SARS Coronavirus 2>> Negative 4/15: Influenza A&B>>  Negative  Antimicrobials:   None  Interim history/subjective:  Tolerating 28% ATC Not tolerating PMV Copious secretions T Max 99.2 last 24  Objective   Blood pressure 122/90, pulse (!)  102, temperature 98.9 F (37.2 C), temperature source Oral, resp. rate (!) 22, weight 114.8 kg, last menstrual period 01/13/2012, SpO2 95 %.    FiO2 (%):  [28 %] 28 %   Intake/Output Summary (Last 24 hours) at 07/05/2019 1010 Last data filed at 07/05/2019 0600 Gross per 24 hour  Intake 1700 ml  Output 875 ml  Net 825 ml   Filed Weights   07/02/19 0424 07/03/19 0500 07/05/19 0418  Weight: 113 kg 113 kg 114.8 kg    Examination: General: obese female on vent HENT: [place]/AT, PERRL, no JVD. Lipoma noted on forehead.  Lungs: Rhonchi Cardiovascular: RRR, no MRG Abdomen: Soft, non-distended Extremities: No acute deformity Neuro: Sedated RASS -3  Resolved Hospital Problem list     Assessment & Plan:   Angioedema, with airway compromise. Respiratory Failure. She was  on ACE-I and has a history of cocaine abuse, both of which could be the culprit. Also she has a recent injection site in the R deltoid. Unsure what was injected. Recently started on Keflex as well for UTI . Janina Mayo was difficult procedure complicated by bleeding.  Pt has copious secretions 4/23 Continued improvement in  tongue protrusion and edema. Per Speech note 4/21>> may use Passy-Muir Speech Valve: with SLP only>> Not tolerating well Strong cough Plan - Continue ATC at 28% - Down sized to # 6 trach on 4/20 >> Goal is to downsize to  4 trach - Routine Trach care  - Appreciate continued assistance of Speech Therapy re: PMV - Trend CXR prn - Consider sputum Cx - Aggressive Pulmonary Toilet - OOB to chair -  PT/OT, ambulate as able  PCCM will continue to follow twice a week for trach management.    Best practice:  Diet: Tube Feeds Pain/Anxiety/Delirium protocol (if indicated): NA VAP protocol (if indicated): yes DVT prophylaxis: SCD GI prophylaxis: pepcid Glucose control: SSI/ CBG Q 4 Mobility: OOB to chair Code Status: FULL Family Communication: Has legal guardian in EMR, but telephone number is not correct.  Son did not answer.  Pt. Updated at bedside 4/23 Disposition: ICU until progressive bed is availably;e  Labs   CBC: Recent Labs  Lab 06/30/19 0303 07/01/19 0302 07/02/19 0316 07/03/19 0654 07/05/19 0322  WBC 11.8* 12.2* 13.0* 10.5 7.5  HGB 13.9 13.3 12.8 12.0 11.3*  HCT 43.5 42.0 41.1 38.3 36.7  MCV 89.0 91.7 92.2 92.1 93.1  PLT 231 226 203 219 249    Basic Metabolic Panel: Recent Labs  Lab 06/29/19 0403 06/29/19 0403 06/30/19 0303 07/01/19 0302 07/02/19 0316 07/03/19 0654 07/05/19 0322  NA 135   < > 143 146* 147* 144 139  K 4.6   < > 3.9 4.2 4.0 3.9 4.1  CL 99   < > 103 104 107 104 99  CO2 26   < > 29 27 31 31 30   GLUCOSE 228*   < > 111* 137* 139* 141* 182*  BUN 25*   < > 37* 33* 27* 21* 10  CREATININE 1.11*   < > 0.82 0.78 0.71 0.76 0.67  CALCIUM 8.9   < > 9.7 9.6 9.3 8.7* 8.8*  MG 2.3  --  2.1 2.1 2.2 2.3  --   PHOS 3.8  --  3.3 4.3 4.0 3.3  --    < > = values in this interval not displayed.   GFR: Estimated Creatinine Clearance: 101.5 mL/min (by C-G formula based on SCr of 0.67 mg/dL). Recent Labs  Lab 06/29/19 0403 06/30/19 0303 07/01/19 0302 07/02/19 0316 07/03/19 0654 07/05/19 0322  PROCALCITON <0.10  --   --   --   --   --   WBC 8.0   < > 12.2* 13.0* 10.5 7.5   < > = values in this interval not displayed.    Liver Function Tests: Recent Labs  Lab 07/05/19 0322  AST 31  ALT 72*  ALKPHOS 74  BILITOT 0.3  PROT 6.1*  ALBUMIN 2.4*   No results for input(s): LIPASE, AMYLASE in the last 168 hours. No results for input(s): AMMONIA in the last 168 hours.  ABG    Component Value Date/Time   PHART 7.308 (L) 06/28/2019 0311   PCO2ART 53.6 (H) 06/28/2019 0311   PO2ART 75.0 (L) 06/28/2019 0311   HCO3 26.8 06/28/2019 0311   TCO2 28 06/28/2019 0311   ACIDBASEDEF 1.5 01/30/2008 0608   O2SAT 93.0 06/28/2019 0311     Coagulation Profile: No results for input(s): INR, PROTIME in the last 168 hours.  Cardiac Enzymes: No results for input(s):  CKTOTAL, CKMB, CKMBINDEX, TROPONINI in the last 168 hours.  HbA1C: Hgb A1c MFr Bld  Date/Time Value Ref Range Status  06/30/2019 03:03 AM 7.8 (H) 4.8 - 5.6 % Final    Comment:    (NOTE) Pre diabetes:          5.7%-6.4% Diabetes:              >6.4% Glycemic control for   <7.0% adults with diabetes   06/21/2010 03:52 PM (H) <5.7 % Final   5.9 (NOTE)  According to the ADA Clinical Practice Recommendations for 2011, when HbA1c is used as a screening test:   >=6.5%   Diagnostic of Diabetes Mellitus           (if abnormal result  is confirmed)  5.7-6.4%   Increased risk of developing Diabetes Mellitus  References:Diagnosis and Classification of Diabetes Mellitus,Diabetes [id number],6578,46(NGEXB 1):S62-S69 and Standards of Medical Care in         Diabetes - 2011,Diabetes MWUX,3244,01  (Suppl 1):S11-S61.    CBG: Recent Labs  Lab 07/04/19 1524 07/04/19 1932 07/04/19 2329 07/05/19 0332 07/05/19 0804  GLUCAP 144* 141* 123* 168* 153*    Review of Systems:   Unable as patient is encephalopathic and intubated.   Past Medical History  She,  has a past medical history of Arthritis, Bipolar 1 disorder ([place]), Hypertension, Obesity, Psychiatric illness, Schizo-affective schizophrenia ([place]), Seizures ([place]), and Substance abuse ([place]).   Surgical History    Past Surgical History:  Procedure Laterality Date  . STOMACH SURGERY    . TRACHEOSTOMY TUBE PLACEMENT N/A 06/28/2019   Procedure: TRACHEOSTOMY - 8DCT;  Surgeon: Izora Gala, MD;  Location: [facility];  Service: ENT;  Laterality: N/A;     Social History   reports that she has been smoking cigarettes. She has been smoking about 1.00 pack per day. She has never used smokeless tobacco. She reports current alcohol use of about 6.0 standard drinks of alcohol per week. She reports current drug use. Frequency: 5.00 times per week. Drugs: "Crack" cocaine and Cocaine.   Family History    Her family history includes Alzheimer's disease in her mother; Depression in her mother.   Allergies Allergies  Allergen Reactions  . Lisinopril Swelling    Angioedema resulting in emergent trach     Home Medications  Prior to Admission medications   Medication Sig Start Date End Date Taking? Authorizing Provider  cephALEXin (KEFLEX) 500 MG capsule 1 cap po bid x 7 days 06/21/19   Muthersbaugh, Jarrett Soho, PA-C  divalproex (DEPAKOTE ER) 500 MG 24 hr tablet Take 1 tablet (500 mg total) by mouth 2 (two) times daily. Patient not taking: Reported on 03/03/2019 12/28/15   Niel Hummer, NP  hydrochlorothiazide (MICROZIDE) 12.5 MG capsule Take 1 capsule (12.5 mg total) by mouth daily. Patient not taking: Reported on 03/03/2019 12/16/18   Kendrick, Caitlyn S, PA-C  lisinopril (PRINIVIL,ZESTRIL) 5 MG tablet Take 1 tablet (5 mg total) by mouth daily. Patient not taking: Reported on 03/03/2019 12/28/15   Niel Hummer, NP  paliperidone (INVEGA SUSTENNA) 156 MG/ML SUSP injection Inject 1 mL (156 mg total) into the muscle every 30 (thirty) days. Patient not taking: Reported on 12/03/2016 12/28/15   Niel Hummer, NP  paliperidone (INVEGA SUSTENNA) 234 MG/1.5ML SUSP injection Inject 234 mg into the muscle every 30 (thirty) days.    [provider]  paliperidone (INVEGA) 6 MG 24 hr tablet Take 6 mg by mouth daily.    [provider]  potassium chloride (K-DUR,KLOR-CON) 10 MEQ tablet Take 1 tablet (10 mEq total) by mouth daily. Patient not taking: Reported on 03/03/2019 12/28/15   Niel Hummer, NP  QUEtiapine (SEROQUEL) 100 MG tablet Take 100 mg by mouth at bedtime.    [provider]  risperiDONE (RISPERDAL) 2 MG tablet Take 1 tablet (2 mg total) by mouth 2 (two) times daily. Patient not taking: Reported on 03/03/2019 12/28/15   Niel Hummer, NP  topiramate (TOPAMAX) 50 MG tablet Take 50 mg by mouth. 09/16/16  [provider]  traZODone (DESYREL) 50 MG tablet Take 50  mg by mouth. 09/16/16   [provider]  Valbenazine Tosylate (INGREZZA) 40 MG CAPS Take 1 capsule by mouth daily.    [provider]     Critical care time: 65 min     Bevelyn Ngo, MSN, AGACNP-BC [facility] Pulmonary/Critical Care Medicine Pager # [phone] After 4 pm please call [phone] See Amion for personal pager PCCM on call pager [phone]  07/05/2019 10:10 AM

## 2019-07-05 NOTE — Plan of Care (Signed)
Pt worked with PT today and was up to the chair from ~1130- almost 1500.  SLP tried PMV but still too much swelling and air pressure.  Secretions have diminished since the morning.  CCM MD wants to wait til secretions further reduced to change cannula from 6 to 4 - placed 4 at bedside. Pt states ready to go home.  Continue to monitor.

## 2019-07-06 LAB — GLUCOSE, CAPILLARY
Glucose-Capillary: 184 mg/dL — ABNORMAL HIGH (ref 70–99)
Glucose-Capillary: 158 mg/dL — ABNORMAL HIGH (ref 70–99)
Glucose-Capillary: 169 mg/dL — ABNORMAL HIGH (ref 70–99)
Glucose-Capillary: 103 mg/dL — ABNORMAL HIGH (ref 70–99)
Glucose-Capillary: 190 mg/dL — ABNORMAL HIGH (ref 70–99)
Glucose-Capillary: 230 mg/dL — ABNORMAL HIGH (ref 70–99)

## 2019-07-06 MED ORDER — METHYLPREDNISOLONE SODIUM SUCC 40 MG IJ SOLR
40.0000 mg | INTRAMUSCULAR | Status: DC
  Administered 2019-07-06 – 2019-07-10 (×4): 40 mg via INTRAVENOUS
  Filled 2019-07-06 (×5): qty 1

## 2019-07-06 NOTE — Progress Notes (Signed)
PROGRESS NOTE    [name]  UTM:[id number] DOB: 02-08-1961 DOA: 06/27/2019 PCP: Medicine, [facility]    Brief Narrative:  59yo with hx bipolar 1 do, HTN, schizoaffective disorder, seizures who presented with angioedema, presumed from ACEI. Pt required emergent tracheostomy and admitted to ICU. Now on trach collar and transferred to [facility] service as of 4/22  Assessment & Plan:   Active Problems:   Angioedema   Acute respiratory failure (HCC)   Tracheostomy status (HCC)  Acute hypoxemic respiratory failure s/p emergent trach Angioedema, with airway compromise.  -Suspect related to ACE-inhibitor vs cocaine use as UDS was pos for cocaine as well as benzos - Lisinopril added to allergy list.  -Continued famotidine. -Therapy recs for CIR were noted. Seen by Inpt rehab who had suggested looking into SNF given trach - Still with significant swelling. IV steroids were given very briefly while in ICU. Given continued swelling. Will give trial of steroid taper with IV solumedrol  Hyperglycemia: Improved - No Hx of Diabetes, A1cnow 7.8 - TF coverage - Will continue with SSI as tolerated  HTN - Holding lisinopril per above - Cont on HCTZ 12.'5mg'$  Daily with labetalol '100mg'$  BID - BP seems better controlled at this time  Schizoaffective disorder Bipolar disorder Seizure history -Per report, [facility] D/[facility] July 2020 was taking: Cogentin 0.'5mg'$ , Quetiapine '100mg'$ , Depakote '500mg'$ , Topiramate '50mg'$ , and Invega sustenna (injection).Pharmacy reports she has only filled Haldol - Continue Quetiapine - cont Haldol PRN - PRN Klonopin -Precedex stopped as of 4/20 -On Invega q 30 days prior to admit  DVT prophylaxis: Lovenox subq Code Status: Full Family Communication: Pt in room, family not at bedside  Status is: Inpatient  Remains inpatient appropriate because:Inpatient level of care appropriate due to severity of illness   Dispo: The patient is from: Home     Anticipated d/c is to: SNF              Anticipated d/c date is: 3 days              Patient currently is not medically stable to d/c.   Consultants:   PCCM  Procedures:   Trach placement  Antimicrobials: Anti-infectives (From admission, onward)   None      Subjective: Unable to vocalize, however is able to mouth the words that she wants to go home and she wants to eat  Objective: Vitals:   07/06/19 0705 07/06/19 0859 07/06/19 1132 07/06/19 1300  BP:  129/75  135/87  Pulse: 97 91 85 80  Resp: '18 18 18 20  '$ Temp:  99 F (37.2 C)  98.9 F (37.2 C)  TempSrc:  Oral  Oral  SpO2: 96% 97% 97% 98%  Weight:        Intake/Output Summary (Last 24 hours) at 07/06/2019 1504 Last data filed at 07/06/2019 5681 Gross per 24 hour  Intake 1821.33 ml  Output 600 ml  Net 1221.33 ml   Filed Weights   07/03/19 0500 07/05/19 0418 07/06/19 0408  Weight: 113 kg 114.8 kg 116.2 kg    Examination: General exam: Conversant by mouthing words, in no acute distress, trach in place Respiratory system: normal chest rise, clear, no audible wheezing Cardiovascular system: regular rhythm, s1-s2 Gastrointestinal system: Nondistended, nontender, pos BS Central nervous system: No seizures, no tremors Extremities: No cyanosis, no joint deformities Skin: No rashes, no pallor Psychiatry: Affect normal // mood seems normal  Data Reviewed: I have personally reviewed following labs and imaging studies  CBC: Recent  Labs  Lab 06/30/19 0303 07/01/19 0302 07/02/19 0316 07/03/19 0654 07/05/19 0322  WBC 11.8* 12.2* 13.0* 10.5 7.5  HGB 13.9 13.3 12.8 12.0 11.3*  HCT 43.5 42.0 41.1 38.3 36.7  MCV 89.0 91.7 92.2 92.1 93.1  PLT 231 226 203 219 825   Basic Metabolic Panel: Recent Labs  Lab 06/30/19 0303 07/01/19 0302 07/02/19 0316 07/03/19 0654 07/05/19 0322  NA 143 146* 147* 144 139  K 3.9 4.2 4.0 3.9 4.1  CL 103 104 107 104 99  CO2 '29 27 31 31 30  '$ GLUCOSE 111* 137* 139* 141* 182*  BUN  37* 33* 27* 21* 10  CREATININE 0.82 0.78 0.71 0.76 0.67  CALCIUM 9.7 9.6 9.3 8.7* 8.8*  MG 2.1 2.1 2.2 2.3  --   PHOS 3.3 4.3 4.0 3.3  --    GFR: Estimated Creatinine Clearance: 102.2 mL/min (by C-G formula based on SCr of 0.67 mg/dL). Liver Function Tests: Recent Labs  Lab 07/05/19 0322  AST 31  ALT 72*  ALKPHOS 74  BILITOT 0.3  PROT 6.1*  ALBUMIN 2.4*   No results for input(s): LIPASE, AMYLASE in the last 168 hours. No results for input(s): AMMONIA in the last 168 hours. Coagulation Profile: No results for input(s): INR, PROTIME in the last 168 hours. Cardiac Enzymes: No results for input(s): CKTOTAL, CKMB, CKMBINDEX, TROPONINI in the last 168 hours. BNP (last 3 results) No results for input(s): PROBNP in the last 8760 hours. HbA1C: No results for input(s): HGBA1C in the last 72 hours. CBG: Recent Labs  Lab 07/05/19 1959 07/05/19 2346 07/06/19 0359 07/06/19 0901 07/06/19 1226  GLUCAP 97 146* 184* 158* 169*   Lipid Profile: No results for input(s): CHOL, HDL, LDLCALC, TRIG, CHOLHDL, LDLDIRECT in the last 72 hours. Thyroid Function Tests: No results for input(s): TSH, T4TOTAL, FREET4, T3FREE, THYROIDAB in the last 72 hours. Anemia Panel: No results for input(s): VITAMINB12, FOLATE, FERRITIN, TIBC, IRON, RETICCTPCT in the last 72 hours. Sepsis Labs: No results for input(s): PROCALCITON, LATICACIDVEN in the last 168 hours.  Recent Results (from the past 240 hour(s))  Respiratory Panel by RT PCR (Flu A&B, Covid) - Nasopharyngeal Swab     Status: None   Collection Time: 06/27/19 10:49 PM   Specimen: Nasopharyngeal Swab  Result Value Ref Range Status   SARS Coronavirus 2 by RT PCR NEGATIVE NEGATIVE Final    Comment: (NOTE) SARS-CoV-2 target nucleic acids are NOT DETECTED. The SARS-CoV-2 RNA is generally detectable in upper respiratoy specimens during the acute phase of infection. The lowest concentration of SARS-CoV-2 viral copies this assay can detect is 131  copies/mL. A negative result does not preclude SARS-Cov-2 infection and should not be used as the sole basis for treatment or other patient management decisions. A negative result may occur with  improper specimen collection/handling, submission of specimen other than nasopharyngeal swab, presence of viral mutation(s) within the areas targeted by this assay, and inadequate number of viral copies (<131 copies/mL). A negative result must be combined with clinical observations, patient history, and epidemiological information. The expected result is Negative. Fact Sheet for Patients:  PinkCheek.be Fact Sheet for Healthcare Providers:  GravelBags.it This test is not yet ap proved or cleared by the [place] FDA and  has been authorized for detection and/or diagnosis of SARS-CoV-2 by FDA under an Emergency Use Authorization (EUA). This EUA will remain  in effect (meaning this test can be used) for the duration of the COVID-19 declaration under Section 564(b)(1) of the Act, 21  U.S.C. section 360bbb-3(b)(1), unless the authorization is terminated or revoked sooner.    Influenza A by PCR NEGATIVE NEGATIVE Final   Influenza B by PCR NEGATIVE NEGATIVE Final    Comment: (NOTE) The Xpert Xpress SARS-CoV-2/FLU/RSV assay is intended as an aid in  the diagnosis of influenza from Nasopharyngeal swab specimens and  should not be used as a sole basis for treatment. Nasal washings and  aspirates are unacceptable for Xpert Xpress SARS-CoV-2/FLU/RSV  testing. Fact Sheet for Patients: PinkCheek.be Fact Sheet for Healthcare Providers: GravelBags.it This test is not yet approved or cleared by the [place] FDA and  has been authorized for detection and/or diagnosis of SARS-CoV-2 by  FDA under an Emergency Use Authorization (EUA). This EUA will remain  in effect (meaning this test can  be used) for the duration of the  Covid-19 declaration under Section 564(b)(1) of the Act, 21  U.S.C. section 360bbb-3(b)(1), unless the authorization is  terminated or revoked. Performed at [facility], [place] [address], [place], [place] [address]   MRSA PCR Screening     Status: None   Collection Time: 06/28/19  2:22 AM   Specimen: Nasopharyngeal  Result Value Ref Range Status   MRSA by PCR NEGATIVE NEGATIVE Final    Comment:        The GeneXpert MRSA Assay (FDA approved for NASAL specimens only), is one component of a comprehensive MRSA colonization surveillance program. It is not intended to diagnose MRSA infection nor to guide or monitor treatment for MRSA infections. Performed at [facility], [place] [address], [place], [place] [address]      Radiology Studies: No results found.  Scheduled Meds: . chlorhexidine gluconate (MEDLINE KIT)  15 mL Mouth Rinse BID  . Chlorhexidine Gluconate Cloth  6 each Topical Daily  . enoxaparin (LOVENOX) injection  40 mg Subcutaneous Q24H  . famotidine  20 mg Per Tube BID  . free water  200 mL Per Tube Q6H  . hydrochlorothiazide  13 mg Per Tube Daily  . insulin aspart  3-9 Units Subcutaneous Q4H  . insulin aspart  6 Units Subcutaneous Q4H  . labetalol  100 mg Per Tube BID  . mouth rinse  15 mL Mouth Rinse 10 times per day  . multivitamin with minerals  1 tablet Per Tube Daily  . [START ON 07/08/2019] paliperidone  234 mg Intramuscular Once  . QUEtiapine  100 mg Per Tube QHS   Continuous Infusions: . sodium chloride Stopped (06/28/19 1431)  . feeding supplement (OSMOLITE 1.5 CAL) 1,000 mL (07/06/19 0035)     LOS: 8 days   Marylu Lund, MD [facility] Pager On [facility]  If 7PM-7AM, please contact night-coverage 07/06/2019, 3:04 PM

## 2019-07-07 LAB — GLUCOSE, CAPILLARY
Glucose-Capillary: 125 mg/dL — ABNORMAL HIGH (ref 70–99)
Glucose-Capillary: 153 mg/dL — ABNORMAL HIGH (ref 70–99)
Glucose-Capillary: 163 mg/dL — ABNORMAL HIGH (ref 70–99)
Glucose-Capillary: 159 mg/dL — ABNORMAL HIGH (ref 70–99)
Glucose-Capillary: 180 mg/dL — ABNORMAL HIGH (ref 70–99)
Glucose-Capillary: 257 mg/dL — ABNORMAL HIGH (ref 70–99)

## 2019-07-07 NOTE — Progress Notes (Signed)
PROGRESS NOTE    [name]  WUJ:[id number] DOB: 02/09/1961 DOA: 06/27/2019 PCP: [facility], [facility]    Brief Narrative:  59yo with hx bipolar 1 do, HTN, schizoaffective disorder, seizures who presented with angioedema, presumed from ACEI. Pt required emergent tracheostomy and admitted to ICU. Now on trach collar and transferred to [facility] service as of 4/22  Assessment & Plan:   Active Problems:   Angioedema   Acute respiratory failure (HCC)   Tracheostomy status (HCC)  Acute hypoxemic respiratory failure s/p emergent trach Angioedema, with airway compromise.  -Suspect related to ACE-inhibitor vs cocaine use as UDS was pos for cocaine as well as benzos - Lisinopril added to allergy list.  -Continued famotidine. -Therapy recs for CIR were noted. Seen by Inpt rehab who had suggested looking into SNF given trach - Still with significant swelling. IV steroids were given very briefly while in ICU. Given continued swelling, have continued on IV steroids  Hyperglycemia: Improved - No Hx of Diabetes, A1cnow 7.8 - TF coverage - Will continue with SSI as tolerated  HTN - Holding lisinopril per above - Cont on HCTZ 12.'5mg'$  Daily with labetalol '100mg'$  BID - BP seems better controlled at this time  Schizoaffective disorder Bipolar disorder Seizure history -Per report, [facility] D/[facility] July 2020 was taking: Cogentin 0.'5mg'$ , Quetiapine '100mg'$ , Depakote '500mg'$ , Topiramate '50mg'$ , and Invega sustenna (injection).Pharmacy reports she has only filled Haldol - Continue Quetiapine - cont Haldol PRN - PRN Klonopin -Precedex stopped as of 4/20 -On Invega q 30 days prior to admit -Seems stable currently  DVT prophylaxis: Lovenox subq Code Status: Full Family Communication: Pt in room, family not at bedside  Status is: Inpatient  Remains inpatient appropriate because:Inpatient level of care appropriate due to severity of illness   Dispo: The patient is from: Home        Anticipated d/c is to: SNF              Anticipated d/c date is: > 3 days              Patient currently is not medically stable to d/c.   Consultants:   PCCM  Procedures:   Trach placement  Antimicrobials: Anti-infectives (From admission, onward)   None      Subjective: Cannot verbalize, however, again states she wants to go home  Objective: Vitals:   07/07/19 0100 07/07/19 0439 07/07/19 0817 07/07/19 1126  BP:   116/85   Pulse: 81  88 96  Resp: 16  18 (!) 22  Temp:   98.4 F (36.9 C)   TempSrc:      SpO2: 97%  98% 93%  Weight:  115.4 kg      Intake/Output Summary (Last 24 hours) at 07/07/2019 1524 Last data filed at 07/07/2019 0326 Gross per 24 hour  Intake 1171.5 ml  Output 400 ml  Net 771.5 ml   Filed Weights   07/05/19 0418 07/06/19 0408 07/07/19 0439  Weight: 114.8 kg 116.2 kg 115.4 kg    Examination: General exam: Awake, laying in bed, in nad Respiratory system: Normal respiratory effort, no wheezing, trach in place Cardiovascular system: regular rate, s1, s2 Gastrointestinal system: Soft, nondistended, positive BS Central nervous system: CN2-12 grossly intact, strength intact Extremities: Perfused, no clubbing Skin: Normal skin turgor, no notable skin lesions seen Psychiatry: Mood normal // no visual hallucinations   Data Reviewed: I have personally reviewed following labs and imaging studies  CBC: Recent Labs  Lab 07/01/19 0302 07/02/19 0316 07/03/19  0654 07/05/19 0322  WBC 12.2* 13.0* 10.5 7.5  HGB 13.3 12.8 12.0 11.3*  HCT 42.0 41.1 38.3 36.7  MCV 91.7 92.2 92.1 93.1  PLT 226 203 219 031   Basic Metabolic Panel: Recent Labs  Lab 07/01/19 0302 07/02/19 0316 07/03/19 0654 07/05/19 0322  NA 146* 147* 144 139  K 4.2 4.0 3.9 4.1  CL 104 107 104 99  CO2 '27 31 31 30  '$ GLUCOSE 137* 139* 141* 182*  BUN 33* 27* 21* 10  CREATININE 0.78 0.71 0.76 0.67  CALCIUM 9.6 9.3 8.7* 8.8*  MG 2.1 2.2 2.3  --   PHOS 4.3 4.0 3.3  --     GFR: Estimated Creatinine Clearance: 101.8 mL/min (by C-G formula based on SCr of 0.67 mg/dL). Liver Function Tests: Recent Labs  Lab 07/05/19 0322  AST 31  ALT 72*  ALKPHOS 74  BILITOT 0.3  PROT 6.1*  ALBUMIN 2.4*   No results for input(s): LIPASE, AMYLASE in the last 168 hours. No results for input(s): AMMONIA in the last 168 hours. Coagulation Profile: No results for input(s): INR, PROTIME in the last 168 hours. Cardiac Enzymes: No results for input(s): CKTOTAL, CKMB, CKMBINDEX, TROPONINI in the last 168 hours. BNP (last 3 results) No results for input(s): PROBNP in the last 8760 hours. HbA1C: No results for input(s): HGBA1C in the last 72 hours. CBG: Recent Labs  Lab 07/06/19 2316 07/07/19 0414 07/07/19 0825 07/07/19 1002 07/07/19 1237  GLUCAP 230* 125* 153* 163* 159*   Lipid Profile: No results for input(s): CHOL, HDL, LDLCALC, TRIG, CHOLHDL, LDLDIRECT in the last 72 hours. Thyroid Function Tests: No results for input(s): TSH, T4TOTAL, FREET4, T3FREE, THYROIDAB in the last 72 hours. Anemia Panel: No results for input(s): VITAMINB12, FOLATE, FERRITIN, TIBC, IRON, RETICCTPCT in the last 72 hours. Sepsis Labs: No results for input(s): PROCALCITON, LATICACIDVEN in the last 168 hours.  Recent Results (from the past 240 hour(s))  Respiratory Panel by RT PCR (Flu A&B, Covid) - Nasopharyngeal Swab     Status: None   Collection Time: 06/27/19 10:49 PM   Specimen: Nasopharyngeal Swab  Result Value Ref Range Status   SARS Coronavirus 2 by RT PCR NEGATIVE NEGATIVE Final    Comment: (NOTE) SARS-CoV-2 target nucleic acids are NOT DETECTED. The SARS-CoV-2 RNA is generally detectable in upper respiratoy specimens during the acute phase of infection. The lowest concentration of SARS-CoV-2 viral copies this assay can detect is 131 copies/mL. A negative result does not preclude SARS-Cov-2 infection and should not be used as the sole basis for treatment or other patient  management decisions. A negative result may occur with  improper specimen collection/handling, submission of specimen other than nasopharyngeal swab, presence of viral mutation(s) within the areas targeted by this assay, and inadequate number of viral copies (<131 copies/mL). A negative result must be combined with clinical observations, patient history, and epidemiological information. The expected result is Negative. Fact Sheet for Patients:  PinkCheek.be Fact Sheet for Healthcare Providers:  GravelBags.it This test is not yet ap proved or cleared by the [place] FDA and  has been authorized for detection and/or diagnosis of SARS-CoV-2 by FDA under an Emergency Use Authorization (EUA). This EUA will remain  in effect (meaning this test can be used) for the duration of the COVID-19 declaration under Section 564(b)(1) of the Act, 21 U.S.C. section 360bbb-3(b)(1), unless the authorization is terminated or revoked sooner.    Influenza A by PCR NEGATIVE NEGATIVE Final   Influenza B by PCR  NEGATIVE NEGATIVE Final    Comment: (NOTE) The Xpert Xpress SARS-CoV-2/FLU/RSV assay is intended as an aid in  the diagnosis of influenza from Nasopharyngeal swab specimens and  should not be used as a sole basis for treatment. Nasal washings and  aspirates are unacceptable for Xpert Xpress SARS-CoV-2/FLU/RSV  testing. Fact Sheet for Patients: PinkCheek.be Fact Sheet for Healthcare Providers: GravelBags.it This test is not yet approved or cleared by the [place] FDA and  has been authorized for detection and/or diagnosis of SARS-CoV-2 by  FDA under an Emergency Use Authorization (EUA). This EUA will remain  in effect (meaning this test can be used) for the duration of the  Covid-19 declaration under Section 564(b)(1) of the Act, 21  U.S.C. section 360bbb-3(b)(1), unless the  authorization is  terminated or revoked. Performed at [facility], [place] [address], [place], [place] [address]   MRSA PCR Screening     Status: None   Collection Time: 06/28/19  2:22 AM   Specimen: Nasopharyngeal  Result Value Ref Range Status   MRSA by PCR NEGATIVE NEGATIVE Final    Comment:        The GeneXpert MRSA Assay (FDA approved for NASAL specimens only), is one component of a comprehensive MRSA colonization surveillance program. It is not intended to diagnose MRSA infection nor to guide or monitor treatment for MRSA infections. Performed at [facility], [place] [address] [place], [place] [address]      Radiology Studies: No results found.  Scheduled Meds: . chlorhexidine gluconate (MEDLINE KIT)  15 mL Mouth Rinse BID  . Chlorhexidine Gluconate Cloth  6 each Topical Daily  . enoxaparin (LOVENOX) injection  40 mg Subcutaneous Q24H  . famotidine  20 mg Per Tube BID  . free water  200 mL Per Tube Q6H  . hydrochlorothiazide  13 mg Per Tube Daily  . insulin aspart  3-9 Units Subcutaneous Q4H  . insulin aspart  6 Units Subcutaneous Q4H  . labetalol  100 mg Per Tube BID  . mouth rinse  15 mL Mouth Rinse 10 times per day  . methylPREDNISolone (SOLU-MEDROL) injection  40 mg Intravenous Q24H  . multivitamin with minerals  1 tablet Per Tube Daily  . [START ON 07/08/2019] paliperidone  234 mg Intramuscular Once  . QUEtiapine  100 mg Per Tube QHS   Continuous Infusions: . sodium chloride Stopped (06/28/19 1431)  . feeding supplement (OSMOLITE 1.5 CAL) 1,000 mL (07/06/19 2030)     LOS: 9 days   Marylu Lund, MD [facility] Pager On [place]  If 7PM-7AM, please contact night-coverage 07/07/2019, 3:24 PM

## 2019-07-08 LAB — GLUCOSE, CAPILLARY
Glucose-Capillary: 213 mg/dL — ABNORMAL HIGH (ref 70–99)
Glucose-Capillary: 121 mg/dL — ABNORMAL HIGH (ref 70–99)
Glucose-Capillary: 188 mg/dL — ABNORMAL HIGH (ref 70–99)
Glucose-Capillary: 238 mg/dL — ABNORMAL HIGH (ref 70–99)

## 2019-07-08 MED ORDER — NICOTINE 21 MG/24HR TD PT24
21.0000 mg | MEDICATED_PATCH | Freq: Every day | TRANSDERMAL | Status: DC
  Administered 2019-07-08 – 2019-07-19 (×9): 21 mg via TRANSDERMAL
  Filled 2019-07-08 (×9): qty 1

## 2019-07-08 NOTE — Progress Notes (Signed)
Occupational Therapy Treatment Patient Details Name: [name] MRN: [id number] DOB: [birth year] Today's Date: 07/08/2019    History of present illness Pt is a 59 year old woman who presented with angioedema and airway compromise secondary to ACE inhibitor use. The decision was made to intubate the patient for airway protection and anesthesia was called for difficult airway. Unfortunately the anesthesiologist was unable to intubate the patient and she was taken emergently to OR for tracheostomy. PMH is significant for HTN, schizoaffective disorder, bipolar disorder, seizures, and (cocaine) substance abuse.     OT comments  Pt apparently restless and agitated at beginning of session. Pt wanting to go home, talk with her daughter and pay her bills. Able to get in touch with her case worker in the community and had him talk with her over the phone. Attempted to mobilize with PT however pt declined. Agreeable to transferring to use BSC then sit in chair while bed sheets are changed. By end of session, pt with calmer demeanor. Communicating by writing throughout session. Decreased awareness of safety/deficits and their impact on her functionally. Continue to recommend rehab at SNF prior to DC at this time.   Follow Up Recommendations  Supervision/Assistance - 24 hour;SNF    Equipment Recommendations  3 in 1 bedside commode;Other (comment)    Recommendations for Other Services      Precautions / Restrictions Precautions Precautions: Fall Precaution Comments: NG; behavioral issues       Mobility Bed Mobility               General bed mobility comments: sitting EOB on entry  Transfers Overall transfer level: Needs assistance   Transfers: Stand Pivot Transfers;Squat Pivot Transfers Sit to Stand: Supervision Stand pivot transfers: Min guard       General transfer comment: Poor safety awareness during all mobility    Balance     Sitting balance-Leahy Scale: Good        Standing balance-Leahy Scale: Fair                             ADL either performed or assessed with clinical judgement   ADL Overall ADL's : Needs assistance/impaired Eating/Feeding: NPO   Grooming: Set up;Cueing for safety   Upper Body Bathing: Set up;Sitting   Lower Body Bathing: Minimal assistance;Sit to/from stand   Upper Body Dressing : Set up;Supervision/safety;Sitting   Lower Body Dressing: Sit to/from stand;Moderate assistance   Toilet Transfer: Min guard;Stand-pivot;BSC   Toileting- Clothing Manipulation and Hygiene: Set up;Supervision/safety;Sitting/lateral lean Toileting - Clothing Manipulation Details (indicate cue type and reason): offered to complete sit - stand however pt transferred back to bed and completed using lateral leans     Functional mobility during ADLs: Min guard(for bed BSC to recliner. Pt declined ambulation)       Vision       Perception     Praxis      Cognition Arousal/Alertness: Awake/alert Behavior During Therapy: Restless;Agitated;Impulsive Overall Cognitive Status: No family/caregiver present to determine baseline cognitive functioning                                 General Comments: Pt agitated at beginning of session. Case worker as well as multiple attempts made to call family members. Pt ableto communicate via writting and able to return demonstrate proper way to call out. Pt with poor awareness of deficits. Attmepts to talk on  phone although she is unable to verbalize due to trach. Pt repeatedly writing that she wants to go home. Apparently frustrated and communiating that she has bills to pay. Frustrated with not being able ot get in touch with her daughter        Exercises     Shoulder Instructions       General Comments goals updated    Pertinent Vitals/ Pain       Pain Assessment: Faces Faces Pain Scale: Hurts a little bit Pain Location: discomfort form tube/trach Pain Descriptors /  Indicators: Discomfort;Grimacing Pain Intervention(s): Limited activity within patient's tolerance  Home Living                                          Prior Functioning/Environment              Frequency  Min 2X/week        Progress Toward Goals  OT Goals(current goals can now be found in the care plan section)  Progress towards OT goals: Goals met and updated - see care plan  Acute Rehab OT Goals Patient Stated Goal: to go home OT Goal Formulation: With patient Time For Goal Achievement: 07/16/19 Potential to Achieve Goals: Good ADL Goals Pt Will Perform Grooming: with modified independence;sitting Pt Will Perform Lower Body Bathing: sit to/from stand;with supervision Pt Will Perform Upper Body Dressing: with set-up;sitting Pt Will Perform Lower Body Dressing: with set-up;with supervision;sit to/from stand Pt Will Transfer to Toilet: with supervision;ambulating Pt Will Perform Toileting - Clothing Manipulation and hygiene: sit to/from stand;with modified independence  Plan Discharge plan remains appropriate    Co-evaluation    PT/OT/SLP Co-Evaluation/Treatment: Yes(partial session) Reason for Co-Treatment: Necessary to address cognition/behavior during functional activity   OT goals addressed during session: ADL's and self-care      AM-PAC OT "6 Clicks" Daily Activity     Outcome Measure   Help from another person eating meals?: Total Help from another person taking care of personal grooming?: A Little Help from another person toileting, which includes using toliet, bedpan, or urinal?: A Little Help from another person bathing (including washing, rinsing, drying)?: A Little Help from another person to put on and taking off regular upper body clothing?: A Little Help from another person to put on and taking off regular lower body clothing?: A Lot 6 Click Score: 15    End of Session Equipment Utilized During Treatment: Oxygen(28% FiO2  5L)  OT Visit Diagnosis: Muscle weakness (generalized) (M62.81);Unsteadiness on feet (R26.81);Other symptoms and signs involving cognitive function   Activity Tolerance Treatment limited secondary to agitation   Patient Left in chair;with call bell/phone within reach;with chair alarm set   Nurse Communication Mobility status        Time: 3419-6222 OT Time Calculation (min): 45 min  Charges: OT General Charges $OT Visit: 1 Visit OT Treatments $Self Care/Home Management : 23-37 mins  Maurie Boettcher, OT/L   Acute OT Clinical Specialist [place] Pager [phone] Office [phone]    [facility] 07/08/2019, 12:50 PM

## 2019-07-08 NOTE — Progress Notes (Signed)
PCCM Interval Note  Following for trach management. Attempted to see patient, however patient is very agitated and has been refusing everything today and wanting to go home.    She will not let me assess her and waves and mouths to get out.  Dr. Rhona Leavens is calling patient's daughter to help calm her.  Will continue to follow along.      Posey Boyer, MSN, AGACNP-BC [facility] 07/08/2019, 10:08 AM

## 2019-07-08 NOTE — Progress Notes (Signed)
Inpatient Rehabilitation Admissions Coordinator  Recommend SNF rehab for prolonged recovery. We will sign off at this time. I have alerted SW, Shanita.   Ottie Glazier, RN, MSN Rehab Admissions Coordinator [phone] 07/08/2019 4:25 PM

## 2019-07-08 NOTE — Progress Notes (Signed)
PROGRESS NOTE    [name]  QQP:[id number] DOB: 12-05-1960 DOA: 06/27/2019 PCP: Medicine, [facility]    Brief Narrative:  59yo with hx bipolar 1 do, HTN, schizoaffective disorder, seizures who presented with angioedema, presumed from ACEI. Pt required emergent tracheostomy and admitted to ICU. Now on trach collar and transferred to [facility] service as of 4/22  Assessment & Plan:   Active Problems:   Angioedema   Acute respiratory failure (HCC)   Tracheostomy status (HCC)  Acute hypoxemic respiratory failure s/p emergent trach Angioedema, with airway compromise.  -Suspect related to ACE-inhibitor vs cocaine use as UDS was pos for cocaine as well as benzos - Lisinopril added to allergy list.  -Continued famotidine. -Therapy recs for CIR were noted. Seen by Inpt rehab who had suggested looking into SNF given trach - Still with significant swelling. IV steroids were given very briefly while in ICU. Given continued swelling, continued swelling  Hyperglycemia: Improved - No Hx of Diabetes, A1cnow 7.8 - TF coverage - Will continue SSI as tolerated  HTN - Cont to hold lisinopril per above - Cont on HCTZ 12.329m Daily with labetalol 101mBID - BP seems better controlled  Schizoaffective disorder Bipolar disorder Seizure history -Per report, [facility] D/C July 2020 was taking: Cogentin 0.29m32mQuetiapine 100m71mepakote 500mg66mpiramate 50mg,54m Invega sustenna (injection).Pharmacy reports she has only filled Haldol - Continue Quetiapine - cont Haldol PRN - PRN Klonopin -Precedex stopped as of 4/20 -On Invega q 30 days prior to admit -Agitated this AM, refusing to speak to staff  DVT prophylaxis: Lovenox subq Code Status: Full Family Communication: Pt in room, family not at bedside  Status is: Inpatient  Remains inpatient appropriate because:Inpatient level of care appropriate due to severity of illness   Dispo: The patient is from: Home      Anticipated d/c is to: SNF              Anticipated d/c date is: > 3 days              Patient currently is not medically stable to d/c.   Consultants:   PCCM  Procedures:   Trach placement  Antimicrobials: Anti-infectives (From admission, onward)   None      Subjective: Unable to verbalize. Clearly agitated and refusing to allow staff to assist or provide therapies  Objective: Vitals:   07/08/19 0823 07/08/19 1134 07/08/19 1549 07/08/19 1600  BP:   (!) 142/87   Pulse: 93 99 (!) 109 98  Resp: 18 20 (!) 22   Temp:   98.2 F (36.8 C)   TempSrc:      SpO2: 96% 95% 99%   Weight:       No intake or output data in the 24 hours ending 07/08/19 1825 Filed Weights   07/06/19 0408 07/07/19 0439 07/08/19 0423  Weight: 116.2 kg 115.4 kg 114.3 kg    Examination: General exam: Not conversant, in no acute distress Respiratory system: normal chest rise, clear, no audible wheezing Central nervous system: No seizures, no tremors Extremities: No cyanosis, no joint deformities Skin: No rashes, no pallor  Physical exam very limited as pt was agitated and refused to allow exam  Data Reviewed: I have personally reviewed following labs and imaging studies  CBC: Recent Labs  Lab 07/02/19 0316 07/03/19 0654 07/05/19 0322  WBC 13.0* 10.5 7.5  HGB 12.8 12.0 11.3*  HCT 41.1 38.3 36.7  MCV 92.2 92.1 93.1  PLT 203 219 249  Basic Metabolic Panel: Recent Labs  Lab 07/02/19 0316 07/03/19 0654 07/05/19 0322  NA 147* 144 139  K 4.0 3.9 4.1  CL 107 104 99  CO2 _0 GLUCOSE 139* 141* 182*  BUN 27* 21* 10  CREATININE 0.71 0.76 0.67  CALCIUM 9.3 8.7* 8.8*  MG 2.2 2.3  --   PHOS 4.0 3.3  --    GFR: Estimated Creatinine Clearance: 101.3 mL/min (by C-G formula based on SCr of 0.67 mg/dL). Liver Function Tests: Recent Labs  Lab 07/05/19 0322  AST 31  ALT 72*  ALKPHOS 74  BILITOT 0.3  PROT 6.1*  ALBUMIN 2.4*   No results for input(s): LIPASE, AMYLASE in the  last 168 hours. No results for input(s): AMMONIA in the last 168 hours. Coagulation Profile: No results for input(s): INR, PROTIME in the last 168 hours. Cardiac Enzymes: No results for input(s): CKTOTAL, CKMB, CKMBINDEX, TROPONINI in the last 168 hours. BNP (last 3 results) No results for input(s): PROBNP in the last 8760 hours. HbA1C: No results for input(s): HGBA1C in the last 72 hours. CBG: Recent Labs  Lab 07/07/19 1817 07/07/19 1948 07/08/19 0410 07/08/19 1139 07/08/19 1546  GLUCAP 180* 257* 213* 121* 188*   Lipid Profile: No results for input(s): CHOL, HDL, LDLCALC, TRIG, CHOLHDL, LDLDIRECT in the last 72 hours. Thyroid Function Tests: No results for input(s): TSH, T4TOTAL, FREET4, T3FREE, THYROIDAB in the last 72 hours. Anemia Panel: No results for input(s): VITAMINB12, FOLATE, FERRITIN, TIBC, IRON, RETICCTPCT in the last 72 hours. Sepsis Labs: No results for input(s): PROCALCITON, LATICACIDVEN in the last 168 hours.  No results found for this or any previous visit (from the past 240 hour(s)).   Radiology Studies: No results found.  Scheduled Meds: . chlorhexidine gluconate (MEDLINE KIT)  15 mL Mouth Rinse BID  . Chlorhexidine Gluconate Cloth  6 each Topical Daily  . enoxaparin (LOVENOX) injection  40 mg Subcutaneous Q24H  . famotidine  20 mg Per Tube BID  . free water  200 mL Per Tube Q6H  . hydrochlorothiazide  13 mg Per Tube Daily  . insulin aspart  3-9 Units Subcutaneous Q4H  . insulin aspart  6 Units Subcutaneous Q4H  . labetalol  100 mg Per Tube BID  . methylPREDNISolone (SOLU-MEDROL) injection  40 mg Intravenous Q24H  . multivitamin with minerals  1 tablet Per Tube Daily  . paliperidone  234 mg Intramuscular Once  . QUEtiapine  100 mg Per Tube QHS   Continuous Infusions: . sodium chloride Stopped (06/28/19 1431)  . feeding supplement (OSMOLITE 1.5 CAL) 1,000 mL (07/07/19 1657)     LOS: 10 days   Marylu Lund, MD [facility] Pager On  Amion  If 7PM-7AM, please contact night-coverage 07/08/2019, 6:25 PM

## 2019-07-08 NOTE — Progress Notes (Signed)
Physical Therapy Treatment Patient Details Name: [name] MRN: [id number] DOB: Dec 11, 1960 Today's Date: 07/08/2019    History of Present Illness Pt is a 59 year old woman who presented with angioedema and airway compromise secondary to ACE inhibitor use. The decision was made to intubate the patient for airway protection and anesthesia was called for difficult airway. Unfortunately the anesthesiologist was unable to intubate the patient and she was taken emergently to OR for tracheostomy. PMH is significant for HTN, schizoaffective disorder, bipolar disorder, seizures, and (cocaine) substance abuse.      PT Comments    Upon arrival pt sitting EOB with OT present.  Pt initially declined therapy, but then agreeable to toilet transfers and moving to chair.  Pt was limited due to agitation and impulsiveness -expressing that she wants to go home.  Attempted to educate pt on therapy role in helping her progress mobility in order to be able to return home safely, but pt resistant to explanation.  Cont to recommend assist of 2 due to pt's decreased safety awareness, lines/leads, and impulsiveness.  Pt requiring supervision - min guard for transfers.     Follow Up Recommendations  CIR;Supervision/Assistance - 24 hour     Equipment Recommendations  Rolling walker with 5" wheels;3in1 (PT)    Recommendations for Other Services Rehab consult     Precautions / Restrictions Precautions Precautions: Fall Precaution Comments: NG; behavioral issues Restrictions Weight Bearing Restrictions: No    Mobility  Bed Mobility Overal bed mobility: Needs Assistance             General bed mobility comments: sitting EOB on entry  Transfers Overall transfer level: Needs assistance Equipment used: None Transfers: Sit to/from [employer] Transfers Sit to Stand: Supervision Stand pivot transfers: Min guard       General transfer comment: Poor safety awareness during all mobility; performed  multiple sit to stands during session; stand pivot x 2 (bed to bsc to bed); cues and management of lines; very impulsive  Ambulation/Gait Ambulation/Gait assistance: Min assist Gait Distance (Feet): 3 Feet Assistive device: None Gait Pattern/deviations: Step-through pattern Gait velocity: decreased   General Gait Details: steps to chair; impulsive and unsafe but not waiting for further assist of instruction   Stairs             Wheelchair Mobility    Modified Rankin (Stroke Patients Only)       Balance Overall balance assessment: Needs assistance Sitting-balance support: Feet supported;No upper extremity supported Sitting balance-Leahy Scale: Good Sitting balance - Comments: dynamically steady in sitting; pt able to weight shift completely onto side and then return to sitting   Standing balance support: No upper extremity supported;During functional activity Standing balance-Leahy Scale: Fair Standing balance comment: can release and maintain standing if focused.                            Cognition Arousal/Alertness: Awake/alert Behavior During Therapy: Restless;Agitated;Impulsive Overall Cognitive Status: No family/caregiver present to determine baseline cognitive functioning                                 General Comments: Pt agitated at beginning of session. Case worker as well as multiple attempts made to call family members. Pt ableto communicate via writting and able to return demonstrate proper way to call out. Pt with poor awareness of deficits. Attmepts to talk on phone although she is  unable to verbalize due to trach. Pt repeatedly writing that she wants to go home. Apparently frustrated and communiating that she has bills to pay. Frustrated with not being able ot get in touch with her daughter      Exercises      General Comments General comments (skin integrity, edema, etc.): VSS; pt on 28% 5 LPM trach collar      Pertinent  Vitals/Pain Pain Assessment: Faces Faces Pain Scale: Hurts a little bit Pain Location: discomfort form tube/trach Pain Descriptors / Indicators: Discomfort;Grimacing Pain Intervention(s): Limited activity within patient's tolerance    Home Living                      Prior Function            PT Goals (current goals can now be found in the care plan section) Acute Rehab PT Goals Patient Stated Goal: to go home PT Goal Formulation: With patient Time For Goal Achievement: 07/16/19 Potential to Achieve Goals: Fair Progress towards PT goals: Progressing toward goals    Frequency    Min 3X/week      PT Plan Current plan remains appropriate    Co-evaluation PT/OT/SLP Co-Evaluation/Treatment: Yes Reason for Co-Treatment: For patient/therapist safety;Complexity of the patient's impairments (multi-system involvement) PT goals addressed during session: Mobility/safety with mobility;Balance OT goals addressed during session: ADL's and self-care      AM-PAC PT "6 Clicks" Mobility   Outcome Measure  Help needed turning from your back to your side while in a flat bed without using bedrails?: A Little Help needed moving from lying on your back to sitting on the side of a flat bed without using bedrails?: A Little Help needed moving to and from a bed to a chair (including a wheelchair)?: A Little Help needed standing up from a chair using your arms (e.g., wheelchair or bedside chair)?: None Help needed to walk in hospital room?: A Lot Help needed climbing 3-5 steps with a railing? : A Lot 6 Click Score: 17    End of Session   Activity Tolerance: Treatment limited secondary to agitation Patient left: with chair alarm set;in chair;with call bell/phone within reach(nursing aware pt in chair; feet elevated) Nurse Communication: Mobility status PT Visit Diagnosis: Other abnormalities of gait and mobility (R26.89);Pain;Difficulty in walking, not elsewhere classified (R26.2)      Time: 9242-6834 PT Time Calculation (min) (ACUTE ONLY): 28 min  Charges:  $Therapeutic Activity: 8-22 mins                     Maggie Font, PT Acute Rehab Services Pager [phone] [facility] [phone] [facility] [place] 07/08/2019, 1:41 PM

## 2019-07-09 ENCOUNTER — Inpatient Hospital Stay (HOSPITAL_COMMUNITY): Payer: Medicare Other

## 2019-07-09 DIAGNOSIS — Z930 Tracheostomy status: Secondary | ICD-10-CM | POA: Diagnosis not present

## 2019-07-09 LAB — GLUCOSE, CAPILLARY
Glucose-Capillary: 363 mg/dL — ABNORMAL HIGH (ref 70–99)
Glucose-Capillary: 136 mg/dL — ABNORMAL HIGH (ref 70–99)
Glucose-Capillary: 58 mg/dL — ABNORMAL LOW (ref 70–99)
Glucose-Capillary: 138 mg/dL — ABNORMAL HIGH (ref 70–99)
Glucose-Capillary: 189 mg/dL — ABNORMAL HIGH (ref 70–99)
Glucose-Capillary: 175 mg/dL — ABNORMAL HIGH (ref 70–99)

## 2019-07-09 MED ORDER — INSULIN GLARGINE 100 UNIT/ML ~~LOC~~ SOLN
10.0000 [IU] | Freq: Every day | SUBCUTANEOUS | Status: DC
  Administered 2019-07-09 – 2019-07-18 (×10): 10 [IU] via SUBCUTANEOUS
  Filled 2019-07-09 (×12): qty 0.1

## 2019-07-09 MED ORDER — ADULT MULTIVITAMIN W/MINERALS CH
1.0000 | ORAL_TABLET | Freq: Every day | ORAL | Status: DC
  Administered 2019-07-11 – 2019-07-19 (×8): 1 via ORAL
  Filled 2019-07-09 (×9): qty 1

## 2019-07-09 MED ORDER — INSULIN ASPART 100 UNIT/ML ~~LOC~~ SOLN
8.0000 [IU] | Freq: Once | SUBCUTANEOUS | Status: AC
  Administered 2019-07-09: 8 [IU] via SUBCUTANEOUS

## 2019-07-09 MED ORDER — RESOURCE THICKENUP CLEAR PO POWD
ORAL | Status: DC | PRN
  Filled 2019-07-09: qty 125

## 2019-07-09 MED ORDER — DEXTROSE 50 % IV SOLN
INTRAVENOUS | Status: AC
  Filled 2019-07-09: qty 50

## 2019-07-09 NOTE — Progress Notes (Signed)
  Speech Language Pathology Treatment: Dysphagia;Passy Muir Speaking valve  Patient Details Name: [name] MRN: [id number] DOB: 10-16-1960 Today's Date: 07/09/2019 Time: 5621-3086 SLP Time Calculation (min) (ACUTE ONLY): 20 min  Assessment / Plan / Recommendation Clinical Impression  [name] was cooperative allowing oral care that she performed independently, intervals using speaking valve and po trials. For first time she was able to audibly phonate in breathy, low intensity phrases although limited upper airway patency. SLP used gloved finger occlusion and speaking valve (5-7 seconds) until valve forcefully pushed off. Intelligible 70% of the time in single words and short phrases.   Pt consumed 4 oz container applesauce with only one delayed cough- pt is ready for MBS to objectively evaluate function and initiate solids/liquids if able. Will not use PMV during MBS due to decreased ability to tolerate. MBS schedule today at 11:30.   HPI HPI: 59 year old woman who presented with angioedema and airway compromise secondary to ACE inhibitor use. The decision was made to intubate the patient for airway protection and anesthesia was called for difficult airway. Unfortunately the anesthesiologist was unable to intubate the patient and she was taken emergently to OR for tracheostomy. PMH is significant for HTN, schizoaffective disorder, bipolar disorder, seizures, and (cocaine) substance abuse.       SLP Plan  New goals to be determined pending instrumental study;MBS       Recommendations  Diet recommendations: NPO Medication Administration: Via alternative means      Patient may use Passy-Muir Speech Valve: with SLP only PMSV Supervision: Full MD: Please consider changing trach tube to : Cuffless;Smaller size         Oral Care Recommendations: Oral care QID Follow up Recommendations: 24 hour supervision/assistance SLP Visit Diagnosis: Aphonia (R49.1);Dysphagia, unspecified  (R13.10) Plan: New goals to be determined pending instrumental study;MBS                      Royce Macadamia 07/09/2019, 10:26 AM Breck Coons Lonell Face.Ed Nurse, children's [phone] Office [phone]

## 2019-07-09 NOTE — Progress Notes (Signed)
Pulmonary Progress Note  S: Seen in f/u for trach wean.  Patient more calm this AM.  Still cannot breath past 6-0 shiley.  Moderate white secretions.  O: No acute distress Tongue swelling improved Able to forcefully say single words with trach occluded, unable to breath No peripheral edema RASS 0 Moves all 4 ext  A: Acute hypoxemic respiratory failure due to angioedema s/p emergent trach- improving but lingering glottic edema precludes downsizing at this time. - Steroids fine but no good evidence fo this - Just give time, once able to phone clearly past trach can downsize and consider decannulation - Will see again 4/29  Myrla Halsted MD PCCM

## 2019-07-09 NOTE — Plan of Care (Signed)

## 2019-07-09 NOTE — Progress Notes (Signed)
Nutrition Follow-up  DOCUMENTATION CODES:   Obesity unspecified  INTERVENTION:   - Vital Cuisine Shake TID with meals, each supplement provides 520 kcal and 22 grams of protein  - Magic cup TID with meals, each supplement provides 290 kcal and 9 grams of protein  - MVI with minerals daily  - Monitor PO intake and need to replace Corrak  NUTRITION DIAGNOSIS:   Inadequate oral intake related to inability to eat as evidenced by NPO status.  Progressing, pt now on Dysphagia 1 diet with nectar-thick liquids  GOAL:   Patient will meet greater than or equal to 90% of their needs  Progressing  MONITOR:   PO intake, Supplement acceptance, Diet advancement, Labs, Weight trends  REASON FOR ASSESSMENT:   Consult, Ventilator Enteral/tube feeding initiation and management  ASSESSMENT:   Pt with PMH of bipolar 1 disorder, HTN, obesity, sxhizo-affective schizophrenia, sz, and substance abuse admitted 4/15 for angioedema and had emergent trach placed.  4/15 - admitted, emergent trach 4/16 - Cortrak placed 4/20 - trach downsized 4/21 - Cortrak replaced due to clog 4/27 - pt removed Cortrak, MBSS with diet advanced to Dysphagia 1 with nectar-thick liquids  Discussed pt with RN and MD. Plan is to hold off on replacing Cortrak at this time and monitor pt's PO intake now that her diet has been advanced. RD will d/c tube feeding orders and free water. RN concerned about affect of pt's facial edema on her ability to consume enough POs.  Met with pt at bedside. Pt requesting milkshake and ice cream. RD will order thickened oral nutrition supplements to come with meals.  Per RN note, pt consumed 35% of meal tray and did not want more because she does not like the texture of the food or the thickened beverages.  Admit weight: 115.8 kg Current weight: 114.3 kg  Medications reviewed and include: pepcid, SSI q 4 hours, Lantus 10 units daily, MVI with minerals  Labs reviewed. CBG's: 58-363  x 24 hours  Diet Order:   Diet Order            DIET - DYS 1 Room service appropriate? No; Fluid consistency: Nectar Thick  Diet effective now              EDUCATION NEEDS:   No education needs have been identified at this time  Skin:  Skin Assessment: Reviewed RN Assessment  Last BM:  07/06/19  Height:   Ht Readings from Last 1 Encounters:  06/21/19 '5\' 7"'$  (1.702 m)    Weight:   Wt Readings from Last 1 Encounters:  07/08/19 114.3 kg    Ideal Body Weight:  61.3 kg  BMI:  Body mass index is 39.47 kg/m.  Estimated Nutritional Needs:   Kcal:  1700-1900 kcals  Protein:  85-100 grams  Fluid:  >/= 1.7 L    Gaynell Face, MS, RD, LDN Inpatient Clinical Dietitian Pager: [phone] Weekend/After Hours: [phone]

## 2019-07-09 NOTE — Progress Notes (Signed)
Pt refusing Solumedrol at this time. Waving at nurse and telling nurse to get out of the room.  RN educated patient about reason for Solumedrol and safety.  Pt continuing to refuse medication.

## 2019-07-09 NOTE — Progress Notes (Signed)
Pt ate 35% of lunch tray & 30 mL of nectar thick orange juice.  Pt not wishing to continue eating because she does not like the pureed food and thickened drink.  Asked RN to take tray away.

## 2019-07-09 NOTE — Progress Notes (Signed)
PROGRESS NOTE    [name]  JQB:[id number] DOB: August 15, 1960 DOA: 06/27/2019 PCP: Medicine, [facility]    Brief Narrative:  59yo with hx bipolar 1 do, HTN, schizoaffective disorder, seizures who presented with angioedema, presumed from ACEI. Pt required emergent tracheostomy and admitted to ICU. Now on trach collar and transferred to [facility] service as of 4/22  Assessment & Plan:   Active Problems:   Angioedema   Acute respiratory failure (HCC)   Tracheostomy status (HCC)  Acute hypoxemic respiratory failure s/p emergent trach Angioedema, with airway compromise.  -Suspect related to ACE-inhibitor vs cocaine use as UDS was pos for cocaine as well as benzos - Lisinopril added to allergy list.  -Continued famotidine. -Therapy recs for CIR were noted. Seen by Inpt rehab who had suggested looking into SNF given trach - Still with significant swelling. IV steroids were only given very briefly while in ICU. Given continued swelling, will continue steroids - Passed MBS today, advanced to dysphagia 1 diet with nectar thick liquids  Hyperglycemia: Improved - No Hx of Diabetes, A1cnow 7.8 - TF coverage - Continue with SSI as tolerated  HTN - Cont to hold lisinopril per above - Cont on HCTZ 12.'5mg'$  Daily with labetalol '100mg'$  BID - BP remains stable  Schizoaffective disorder Bipolar disorder Seizure history -Per report, [facility] D/C July 2020 was taking: Cogentin 0.'5mg'$ , Quetiapine '100mg'$ , Depakote '500mg'$ , Topiramate '50mg'$ , and Invega sustenna (injection).Pharmacy reports she has only filled Haldol - Continue Quetiapine - cont Haldol as needed - PRN Klonopin -Precedex stopped as of 4/20 -On Invega q 30 days prior to admit -Mentation stable  DVT prophylaxis: Lovenox subq Code Status: Full Family Communication: Pt in room, family not at bedside  Status is: Inpatient  Remains inpatient appropriate because:Inpatient level of care appropriate due to severity of  illness  Dispo: The patient is from: Home              Anticipated d/c is to: SNF              Anticipated d/c date is: > 3 days              Patient currently is not medically stable to d/c.   Consultants:   PCCM  Procedures:   Trach placement  Antimicrobials: Anti-infectives (From admission, onward)   None      Subjective: Very excited to start diet today  Objective: Vitals:   07/09/19 0812 07/09/19 0838 07/09/19 1158 07/09/19 1550  BP:  120/70    Pulse: (!) 103 (!) 107  (!) 104  Resp: '18 20 20 20  '$ Temp:  97.9 F (36.6 C)    TempSrc:  Oral    SpO2: 97% 93%  96%  Weight:        Intake/Output Summary (Last 24 hours) at 07/09/2019 1607 Last data filed at 07/09/2019 1457 Gross per 24 hour  Intake 30 ml  Output --  Net 30 ml   Filed Weights   07/06/19 0408 07/07/19 0439 07/08/19 0423  Weight: 116.2 kg 115.4 kg 114.3 kg    Examination: General exam: Conversant, in no acute distress Respiratory system: normal chest rise, clear, no audible wheezing Cardiovascular system: regular rhythm, s1-s2 Gastrointestinal system: Nondistended, nontender, pos BS Central nervous system: No seizures, no tremors Extremities: No cyanosis, no joint deformities Skin: No rashes, no pallor Psychiatry: Affect normal //mood seems normal  Data Reviewed: I have personally reviewed following labs and imaging studies  CBC: Recent Labs  Lab 07/03/19 0654  07/05/19 0322  WBC 10.5 7.5  HGB 12.0 11.3*  HCT 38.3 36.7  MCV 92.1 93.1  PLT 219 160   Basic Metabolic Panel: Recent Labs  Lab 07/03/19 0654 07/05/19 0322  NA 144 139  K 3.9 4.1  CL 104 99  CO2 31 30  GLUCOSE 141* 182*  BUN 21* 10  CREATININE 0.76 0.67  CALCIUM 8.7* 8.8*  MG 2.3  --   PHOS 3.3  --    GFR: Estimated Creatinine Clearance: 101.3 mL/min (by C-G formula based on SCr of 0.67 mg/dL). Liver Function Tests: Recent Labs  Lab 07/05/19 0322  AST 31  ALT 72*  ALKPHOS 74  BILITOT 0.3  PROT 6.1*   ALBUMIN 2.4*   No results for input(s): LIPASE, AMYLASE in the last 168 hours. No results for input(s): AMMONIA in the last 168 hours. Coagulation Profile: No results for input(s): INR, PROTIME in the last 168 hours. Cardiac Enzymes: No results for input(s): CKTOTAL, CKMB, CKMBINDEX, TROPONINI in the last 168 hours. BNP (last 3 results) No results for input(s): PROBNP in the last 8760 hours. HbA1C: No results for input(s): HGBA1C in the last 72 hours. CBG: Recent Labs  Lab 07/08/19 2035 07/09/19 0114 07/09/19 0419 07/09/19 0806 07/09/19 1243  GLUCAP 238* 363* 136* 58* 138*   Lipid Profile: No results for input(s): CHOL, HDL, LDLCALC, TRIG, CHOLHDL, LDLDIRECT in the last 72 hours. Thyroid Function Tests: No results for input(s): TSH, T4TOTAL, FREET4, T3FREE, THYROIDAB in the last 72 hours. Anemia Panel: No results for input(s): VITAMINB12, FOLATE, FERRITIN, TIBC, IRON, RETICCTPCT in the last 72 hours. Sepsis Labs: No results for input(s): PROCALCITON, LATICACIDVEN in the last 168 hours.  No results found for this or any previous visit (from the past 240 hour(s)).   Radiology Studies: DG Swallowing Func-Speech Pathology  Result Date: 07/09/2019 Objective Swallowing Evaluation: Type of Study: MBS-Modified Barium Swallow Study  Patient Details Name: [name] MRN: [id number] Date of Birth: [birth year] Today's Date: 07/09/2019 Time: SLP Start Time (ACUTE ONLY): 1150 -SLP Stop Time (ACUTE ONLY): 1209 SLP Time Calculation (min) (ACUTE ONLY): 19 min Past Medical History: Past Medical History: Diagnosis Date . Arthritis  . Bipolar 1 disorder ([place])  . Hypertension  . Obesity  . Psychiatric illness  . Schizo-affective schizophrenia ([place])  . Seizures ([place])  . Substance abuse [facility])  Past Surgical History: Past Surgical History: Procedure Laterality Date . STOMACH SURGERY   . TRACHEOSTOMY TUBE PLACEMENT N/A 06/28/2019  Procedure: TRACHEOSTOMY - 8DCT;  Surgeon: Izora Gala, MD;  Location: [facility];  Service: ENT;  Laterality: N/A; HPI: 59 year old woman who presented with angioedema and airway compromise secondary to ACE inhibitor use. The decision was made to intubate the patient for airway protection and anesthesia was called for difficult airway. Unfortunately the anesthesiologist was unable to intubate the patient and she was taken emergently to OR for tracheostomy. PMH is significant for HTN, schizoaffective disorder, bipolar disorder, seizures, and (cocaine) substance abuse.  No data recorded Assessment / Plan / Recommendation CHL IP CLINICAL IMPRESSIONS 07/09/2019 Clinical Impression Pt not currently tolerating PMV therefore MBS completed without. She demonstrateed mild oropharyngeal dysphagia marked by prolonged mastication with solid (endentulous) and minimal lingual residue. She did not aspirate however thin, nectar and honey thick penetrated laryngeal vestibule. Majority occured before the swallow due to delayed initiation of mechanisms to protect airway and penetrated but completely exited vestibule. No remarkable pharyngeal residue. She continues to have improved but continued upper airway edema and recommend thickening liquids  to nectar consistency, puree (Dys 1) texture, crush meds and full supervision for strategies, safety due to impulsivity. Do not place speaking valve with meals.     SLP Visit Diagnosis Dysphagia, oropharyngeal phase (R13.12) Attention and concentration deficit following -- Frontal lobe and executive function deficit following -- Impact on safety and function Mild aspiration risk;Moderate aspiration risk   CHL IP TREATMENT RECOMMENDATION 07/09/2019 Treatment Recommendations Therapy as outlined in treatment plan below   Prognosis 07/09/2019 Prognosis for Safe Diet Advancement (No Data) Barriers to Reach Goals -- Barriers/Prognosis Comment -- CHL IP DIET RECOMMENDATION 07/09/2019 SLP Diet Recommendations Dysphagia 1 (Puree) solids;Nectar thick liquid Liquid Administration via  Cup;No straw Medication Administration Crushed with puree Compensations Minimize environmental distractions;Slow rate;Small sips/bites;Lingual sweep for clearance of pocketing Postural Changes Seated upright at 90 degrees   CHL IP OTHER RECOMMENDATIONS 07/09/2019 Recommended Consults -- Oral Care Recommendations Oral care BID Other Recommendations --   CHL IP FOLLOW UP RECOMMENDATIONS 07/09/2019 Follow up Recommendations 24 hour supervision/assistance   CHL IP FREQUENCY AND DURATION 07/09/2019 Speech Therapy Frequency (ACUTE ONLY) min 2x/week Treatment Duration 2 weeks      CHL IP ORAL PHASE 07/09/2019 Oral Phase Impaired Oral - Pudding Teaspoon -- Oral - Pudding Cup -- Oral - Honey Teaspoon -- Oral - Honey Cup Lingual/palatal residue Oral - Nectar Teaspoon WFL Oral - Nectar Cup WFL Oral - Nectar Straw WFL Oral - Thin Teaspoon Decreased bolus cohesion Oral - Thin Cup -- Oral - Thin Straw -- Oral - Puree WFL Oral - Mech Soft -- Oral - Regular Delayed oral transit Oral - Multi-Consistency -- Oral - Pill -- Oral Phase - Comment --  CHL IP PHARYNGEAL PHASE 07/09/2019 Pharyngeal Phase Impaired Pharyngeal- Pudding Teaspoon -- Pharyngeal -- Pharyngeal- Pudding Cup -- Pharyngeal -- Pharyngeal- Honey Teaspoon -- Pharyngeal -- Pharyngeal- Honey Cup Penetration/Aspiration during swallow Pharyngeal Material enters airway, remains ABOVE vocal cords then ejected out Pharyngeal- Nectar Teaspoon Penetration/Aspiration before swallow Pharyngeal Material enters airway, remains ABOVE vocal cords then ejected out Pharyngeal- Nectar Cup Delayed swallow initiation-vallecula;Penetration/Aspiration before swallow Pharyngeal Material enters airway, remains ABOVE vocal cords then ejected out Pharyngeal- Nectar Straw WFL Pharyngeal -- Pharyngeal- Thin Teaspoon Penetration/Aspiration before swallow Pharyngeal Material enters airway, remains ABOVE vocal cords then ejected out Pharyngeal- Thin Cup Penetration/Aspiration during swallow Pharyngeal  Material enters airway, remains ABOVE vocal cords then ejected out Pharyngeal- Thin Straw -- Pharyngeal -- Pharyngeal- Puree WFL Pharyngeal -- Pharyngeal- Mechanical Soft -- Pharyngeal -- Pharyngeal- Regular -- Pharyngeal -- Pharyngeal- Multi-consistency -- Pharyngeal -- Pharyngeal- Pill -- Pharyngeal -- Pharyngeal Comment --  CHL IP CERVICAL ESOPHAGEAL PHASE 07/09/2019 Cervical Esophageal Phase WFL Pudding Teaspoon -- Pudding Cup -- Honey Teaspoon -- Honey Cup -- Nectar Teaspoon -- Nectar Cup -- Nectar Straw -- Thin Teaspoon -- Thin Cup -- Thin Straw -- Puree -- Mechanical Soft -- Regular -- Multi-consistency -- Pill -- Cervical Esophageal Comment -- [place] Siren 07/09/2019, 2:23 PM  Orbie Pyo Litaker M.Ed Actor Pager [phone] Office [phone]              Scheduled Meds: . chlorhexidine gluconate (MEDLINE KIT)  15 mL Mouth Rinse BID  . Chlorhexidine Gluconate Cloth  6 each Topical Daily  . enoxaparin (LOVENOX) injection  40 mg Subcutaneous Q24H  . famotidine  20 mg Per Tube BID  . free water  200 mL Per Tube Q6H  . hydrochlorothiazide  13 mg Per Tube Daily  . insulin aspart  3-9 Units Subcutaneous Q4H  . insulin  glargine  10 Units Subcutaneous QHS  . labetalol  100 mg Per Tube BID  . methylPREDNISolone (SOLU-MEDROL) injection  40 mg Intravenous Q24H  . multivitamin with minerals  1 tablet Per Tube Daily  . nicotine  21 mg Transdermal Daily  . paliperidone  234 mg Intramuscular Once  . QUEtiapine  100 mg Per Tube QHS   Continuous Infusions: . sodium chloride Stopped (06/28/19 1431)     LOS: 11 days   Marylu Lund, MD [facility] Pager On Amion  If 7PM-7AM, please contact night-coverage 07/09/2019, 4:07 PM

## 2019-07-09 NOTE — Evaluation (Signed)
Coretrak removed per patient during HS. Patient states she was asleep and cannot account for the removal of the nasal tube. Patient agreeable to reinsertion via appropriate clinician today. On-call practitioner notified. Patient is in NAD following removal of tube.

## 2019-07-09 NOTE — Progress Notes (Signed)
Pt refusing telemetry monitor. MD aware and orders given to discontinue.

## 2019-07-10 DIAGNOSIS — Z9119 Patient's noncompliance with other medical treatment and regimen: Secondary | ICD-10-CM

## 2019-07-10 DIAGNOSIS — F250 Schizoaffective disorder, bipolar type: Secondary | ICD-10-CM

## 2019-07-10 DIAGNOSIS — E119 Type 2 diabetes mellitus without complications: Secondary | ICD-10-CM

## 2019-07-10 DIAGNOSIS — R1312 Dysphagia, oropharyngeal phase: Secondary | ICD-10-CM

## 2019-07-10 DIAGNOSIS — F310 Bipolar disorder, current episode hypomanic: Secondary | ICD-10-CM

## 2019-07-10 DIAGNOSIS — I10 Essential (primary) hypertension: Secondary | ICD-10-CM

## 2019-07-10 LAB — GLUCOSE, CAPILLARY
Glucose-Capillary: 114 mg/dL — ABNORMAL HIGH (ref 70–99)
Glucose-Capillary: 197 mg/dL — ABNORMAL HIGH (ref 70–99)
Glucose-Capillary: 255 mg/dL — ABNORMAL HIGH (ref 70–99)

## 2019-07-10 MED ORDER — QUETIAPINE FUMARATE 100 MG PO TABS
100.0000 mg | ORAL_TABLET | Freq: Every day | ORAL | Status: DC
  Administered 2019-07-10 – 2019-07-18 (×8): 100 mg via ORAL
  Filled 2019-07-10 (×8): qty 1

## 2019-07-10 MED ORDER — HYDROCHLOROTHIAZIDE 12.5 MG PO CAPS
12.5000 mg | ORAL_CAPSULE | Freq: Every day | ORAL | Status: DC
  Administered 2019-07-11 – 2019-07-19 (×9): 12.5 mg via ORAL
  Filled 2019-07-10 (×10): qty 1

## 2019-07-10 MED ORDER — CLONAZEPAM 0.5 MG PO TABS
0.5000 mg | ORAL_TABLET | Freq: Three times a day (TID) | ORAL | Status: DC | PRN
  Administered 2019-07-10 – 2019-07-16 (×4): 0.5 mg via ORAL
  Filled 2019-07-10 (×4): qty 1

## 2019-07-10 MED ORDER — OXYCODONE-ACETAMINOPHEN 5-325 MG PO TABS
1.0000 | ORAL_TABLET | ORAL | Status: DC | PRN
  Administered 2019-07-10 – 2019-07-15 (×4): 1 via ORAL
  Filled 2019-07-10 (×4): qty 1

## 2019-07-10 MED ORDER — INSULIN ASPART 100 UNIT/ML ~~LOC~~ SOLN
0.0000 [IU] | Freq: Three times a day (TID) | SUBCUTANEOUS | Status: DC
  Administered 2019-07-11: 2 [IU] via SUBCUTANEOUS
  Administered 2019-07-11 (×2): 3 [IU] via SUBCUTANEOUS
  Administered 2019-07-12 – 2019-07-14 (×5): 2 [IU] via SUBCUTANEOUS
  Administered 2019-07-14 (×2): 1 [IU] via SUBCUTANEOUS
  Administered 2019-07-15 – 2019-07-16 (×4): 2 [IU] via SUBCUTANEOUS
  Administered 2019-07-17: 1 [IU] via SUBCUTANEOUS
  Administered 2019-07-17 – 2019-07-18 (×2): 2 [IU] via SUBCUTANEOUS
  Administered 2019-07-18 – 2019-07-19 (×2): 1 [IU] via SUBCUTANEOUS
  Administered 2019-07-19: 2 [IU] via SUBCUTANEOUS

## 2019-07-10 MED ORDER — LABETALOL HCL 100 MG PO TABS
100.0000 mg | ORAL_TABLET | Freq: Two times a day (BID) | ORAL | Status: DC
  Administered 2019-07-10 – 2019-07-19 (×17): 100 mg via ORAL
  Filled 2019-07-10 (×18): qty 1

## 2019-07-10 MED ORDER — SENNOSIDES-DOCUSATE SODIUM 8.6-50 MG PO TABS
1.0000 | ORAL_TABLET | Freq: Two times a day (BID) | ORAL | Status: DC | PRN
  Administered 2019-07-11: 1 via ORAL
  Filled 2019-07-10: qty 1

## 2019-07-10 MED ORDER — POLYETHYLENE GLYCOL 3350 17 G PO PACK
17.0000 g | PACK | Freq: Every day | ORAL | Status: DC | PRN
  Administered 2019-07-11: 17 g via ORAL
  Filled 2019-07-10: qty 1

## 2019-07-10 MED ORDER — FAMOTIDINE 20 MG PO TABS
20.0000 mg | ORAL_TABLET | Freq: Two times a day (BID) | ORAL | Status: DC
  Administered 2019-07-10 – 2019-07-19 (×17): 20 mg via ORAL
  Filled 2019-07-10 (×18): qty 1

## 2019-07-10 MED ORDER — INSULIN ASPART 100 UNIT/ML ~~LOC~~ SOLN
0.0000 [IU] | Freq: Every day | SUBCUTANEOUS | Status: DC
  Administered 2019-07-10: 3 [IU] via SUBCUTANEOUS
  Administered 2019-07-15 – 2019-07-18 (×3): 2 [IU] via SUBCUTANEOUS

## 2019-07-10 NOTE — Progress Notes (Signed)
[name] refused her blood sugar check and insulin coverage even after taking blood sugar level. Pt did not take her daily medication per order due to irritation over diet per order. Dr Candelaria Stagers MD was informed. SLP came over and changed pt diet after successful trial. Pt ate 60% of her dinner Dysphagia 3 diet.

## 2019-07-10 NOTE — Progress Notes (Addendum)
Patient refused airway care this morning.   Managed to place her pleth on her finger but she wouldn't allow me to assess his airway.  VS remains stable.  Saturation 97%  Inner cannula was changed earlier

## 2019-07-10 NOTE — Progress Notes (Addendum)
Physical Therapy Treatment Patient Details Name: [name] MRN: [id number] DOB: [birth year] Today's Date: 07/10/2019    History of Present Illness Pt is a 59 year old woman who presented with angioedema and airway compromise secondary to ACE inhibitor use. The decision was made to intubate the patient for airway protection and anesthesia was called for difficult airway. Unfortunately the anesthesiologist was unable to intubate the patient and she was taken emergently to OR for tracheostomy. PMH is significant for HTN, schizoaffective disorder, bipolar disorder, seizures, and (cocaine) substance abuse.      PT Comments    Pt was eager to work with therapies.  Pt irritated with the food that she has to eat and that she has not been discharged to home.  Pt has made notable improvement toward goals, needing minimal assist form much of her mobility.  Emphasis on sit to stands, standing activity, progressing gait and stability    Follow Up Recommendations  Supervision/Assistance - 24 hour;SNF     Equipment Recommendations  Rolling walker with 5" wheels;3in1 (PT)(TBA)    Recommendations for Other Services       Precautions / Restrictions Precautions Precautions: Fall Precaution Comments: Pt extremely impulsive. Poor safety awareness.  Restrictions Weight Bearing Restrictions: No    Mobility  Bed Mobility               General bed mobility comments: Pt sitting EOB upon arrival.   Transfers Overall transfer level: Needs assistance Equipment used: Rolling walker (2 wheeled) Transfers: Sit to/from [employer] Transfers Sit to Stand: +2 physical assistance;Min guard;Supervision         General transfer comment: Cues for safety and activity pacing as pt is impulsive.   Ambulation/Gait Ambulation/Gait assistance: Min assist Gait Distance (Feet): 130 Feet Assistive device: Rolling walker (2 wheeled) Gait Pattern/deviations: Step-through pattern Gait velocity:  decreased Gait velocity interpretation: 1.31 - 2.62 ft/sec, indicative of limited community ambulator General Gait Details: generally steady with episodes of mild instability in turns.   Stairs             Wheelchair Mobility    Modified Rankin (Stroke Patients Only)       Balance Overall balance assessment: Needs assistance Sitting-balance support: Feet supported;Single extremity supported;Bilateral upper extremity supported Sitting balance-Leahy Scale: Good       Standing balance-Leahy Scale: Fair Standing balance comment: did not need external support or her UE assist at sink to be able to do simple ADL tasks.                            Cognition Arousal/Alertness: Awake/alert Behavior During Therapy: WFL for tasks assessed/performed(mildly agitated that she can't go home.  Simultaneous filing. User may not have seen previous data.) Overall Cognitive Status: Difficult to assess(NT formally  Simultaneous filing. User may not have seen previous data.)                                 General Comments: (Simultaneous filing. User may not have seen previous data.)      Exercises      General Comments General comments (skin integrity, edema, etc.): Pt's SpO2 maintained in 90s throughout on room air.       Pertinent Vitals/Pain Pain Assessment: No/denies pain Faces Pain Scale: No hurt    Home Living Family/patient expects to be discharged to:: Group home  Prior Function Level of Independence: Independent      Comments: pt reports no use of AD for mobility and completing ADL without assist - use of yes/no for obtaining PLOF due to trach. Pt reports she doesn't drive   PT Goals (current goals can now be found in the care plan section) Acute Rehab PT Goals Patient Stated Goal: none stated, agreeable to working with therapy PT Goal Formulation: With patient Time For Goal Achievement: 07/16/19 Potential to Achieve  Goals: Good Progress towards PT goals: Progressing toward goals    Frequency    Min 3X/week      PT Plan Current plan remains appropriate    Co-evaluation PT/OT/SLP Co-Evaluation/Treatment: Yes Reason for Co-Treatment: For patient/therapist safety PT goals addressed during session: Mobility/safety with mobility OT goals addressed during session: ADL's and self-care      AM-PAC PT "6 Clicks" Mobility   Outcome Measure  Help needed turning from your back to your side while in a flat bed without using bedrails?: A Little Help needed moving from lying on your back to sitting on the side of a flat bed without using bedrails?: A Little Help needed moving to and from a bed to a chair (including a wheelchair)?: A Little Help needed standing up from a chair using your arms (e.g., wheelchair or bedside chair)?: A Little Help needed to walk in hospital room?: A Little Help needed climbing 3-5 steps with a railing? : A Lot 6 Click Score: 17    End of Session   Activity Tolerance: Patient limited by fatigue;Patient tolerated treatment well Patient left: in chair;with call bell/phone within reach;with chair alarm set Nurse Communication: Mobility status PT Visit Diagnosis: Unsteadiness on feet (R26.81);Muscle weakness (generalized) (M62.81);Difficulty in walking, not elsewhere classified (R26.2)     Time: 3532-9924 PT Time Calculation (min) (ACUTE ONLY): 26 min  Charges:  $Gait Training: 8-22 mins                     07/10/2019  Jacinto Halim., PT Acute Rehabilitation Services [phone]  (pager) [phone]  (office)   Eliseo Gum Zorion Nims 07/10/2019, 4:59 PM

## 2019-07-10 NOTE — Progress Notes (Signed)
Occupational Therapy Treatment  Clinical Impression: Cotx with PT to maximize pt safety and functional performance. Pt making progress in therapy, demonstrating improved activity tolerance and independence with self-care and functional transfer tasks. Pt extremely impulsive requiring cues for safety throughout all tasks. Pt able to ambulate to bedroom sink with variable min guard to min assist for balance and safety. Pt tolerated standing ~3 min to complete grooming/hygiene tasks at the sink. Pt tolerated standing and ambulating additional ~4 min with RW out in hallway. Noted 0 instances of LOB, however pt unsteady on feet. Pt completed toileting task requiring min guard for balance and safety. Pt restless and frustrated throughout session requiring redirection back to tasks. Pt setup in bedside chair with SLP in to work with pt. OT will continue to follow acutely. Continue to recommend SNF placement for additional rehab prior to discharge home.      07/10/19 1634  OT Visit Information  Last OT Received On 07/10/19  Assistance Needed +1 (+2 helpful as pt is impulsive. Poor safety awareness. )  PT/OT/SLP Co-Evaluation/Treatment Yes  Reason for Co-Treatment Necessary to address cognition/behavior during functional activity;For patient/therapist safety;To address functional/ADL transfers  OT goals addressed during session ADL's and self-care  History of Present Illness Pt is a 59 year old woman who presented with angioedema and airway compromise secondary to ACE inhibitor use. The decision was made to intubate the patient for airway protection and anesthesia was called for difficult airway. Unfortunately the anesthesiologist was unable to intubate the patient and she was taken emergently to OR for tracheostomy. PMH is significant for HTN, schizoaffective disorder, bipolar disorder, seizures, and (cocaine) substance abuse.    Precautions  Precautions Fall  Precaution Comments Pt extremely impulsive.  Poor safety awareness.   Pain Assessment  Pain Assessment No/denies pain  Cognition  Arousal/Alertness Awake/alert  Behavior During Therapy Restless;Impulsive  Overall Cognitive Status No family/caregiver present to determine baseline cognitive functioning  General Comments Pt restless and impulsive throughout session requiring cues for safety. Pt frustrated as she wants to go home, pay bills, and talk to her daughter. Pt also frustrated that she has to eat pure food. SLP aware. Pt able to follow majority of one step commands.    Difficult to assess due to Tracheostomy  ADL  Overall ADL's  Needs assistance/impaired  Grooming Wash/dry hands;Wash/dry face;Min guard;Standing  Grooming Details (indicate cue type and reason) While standing at the sink. Pt impulsive, requiring cues for safety. Sits without warning on occasion.   Surveyor, minerals guard;Ambulation;Regular Toilet;Grab bars  Toilet Transfer Details (indicate cue type and reason) Cues for safety.   Toileting- Music therapist guard;Sit to/from stand  Toileting - Clothing Manipulation Details (indicate cue type and reason) Cues for safety  Functional mobility during ADLs Min guard;Minimal assistance;Rolling walker  General ADL Comments Pt able to ambulate to sink, to/from bathroom, and around unit with RW and variable min guard to min assist for balance and safety. Pt impulsive throughout requiring cues for safety.   Bed Mobility  General bed mobility comments Pt sitting EOB upon OT arrival.   Balance  Overall balance assessment Needs assistance  Sitting-balance support Feet supported;No upper extremity supported  Sitting balance-Leahy Scale Good  Standing balance-Leahy Scale Fair  Restrictions  Weight Bearing Restrictions No  Transfers  Overall transfer level Needs assistance  Equipment used Rolling walker (2 wheeled)  Transfers Sit to/from Stand  Sit to Stand Supervision  General transfer comment Cues  for safety and activity pacing as pt  is impulsive.   General Comments  General comments (skin integrity, edema, etc.) Pt's SpO2 maintained in 90s throughout on room air.   OT - End of Session  Equipment Utilized During [employer] walker;Oxygen (5LPM 28%FiO2 for part of session)  Activity Tolerance Patient tolerated treatment well  Patient left in chair;with call bell/phone within reach;Other (comment) (SLP in to work with pt. )  Nurse Communication Mobility status  OT Assessment/Plan  OT Plan Discharge plan remains appropriate  OT Visit Diagnosis Muscle weakness (generalized) (M62.81);Unsteadiness on feet (R26.81);Other symptoms and signs involving cognitive function  Follow Up Recommendations Supervision/Assistance - 24 hour;SNF  OT Equipment 3 in 1 bedside commode  AM-PAC OT "6 Clicks" Daily Activity Outcome Measure (Version 2)  Help from another person eating meals? 1  Help from another person taking care of personal grooming? 3  Help from another person toileting, which includes using toliet, bedpan, or urinal? 3  Help from another person bathing (including washing, rinsing, drying)? 3  Help from another person to put on and taking off regular upper body clothing? 3  Help from another person to put on and taking off regular lower body clothing? 2  6 Click Score 15  OT Goal Progression  Progress towards OT goals Progressing toward goals  ADL Goals  Pt Will Perform Grooming with modified independence;sitting  Pt Will Perform Lower Body Bathing sit to/from stand;with supervision  Pt Will Perform Upper Body Dressing with set-up;sitting  Pt Will Perform Lower Body Dressing with set-up;with supervision;sit to/from stand  Pt Will Transfer to Toilet with supervision;ambulating  Pt Will Perform Toileting - Clothing Manipulation and hygiene sit to/from stand;with modified independence  Pt/caregiver will Perform Home Exercise Program Increased strength;Increased ROM;Right Upper  extremity;With Supervision;With written HEP provided  Additional ADL Goal #1 Pt will maintain sitting balance EOB >7 min at minguard assist level as precursor to ADL.  Additional ADL Goal #2 Pt will follow 2-3 step commands with 75% accuracy.  Additional ADL Goal #3 Pt will participate in further visual assessment.  OT Time Calculation  OT Start Time (ACUTE ONLY) 1443  OT Stop Time (ACUTE ONLY) 1509  OT Time Calculation (min) 26 min  OT General Charges  $OT Visit 1 Visit  OT Treatments  $Self Care/Home Management  8-22 mins   Peterson Ao OTR/L [phone]

## 2019-07-10 NOTE — Progress Notes (Signed)
Patient receptive to minimal care overnight. Refused multiple attempts of collecting CBG to both the NT and RN. Refused care from the respiratory therapist on several different occasions. RN educated patient on importance of nursing interventions, CBGs, and respiratory care. Patient refused and covered her head.

## 2019-07-10 NOTE — Progress Notes (Signed)
PROGRESS NOTE  [name] UYQ:[id number] DOB: 05-23-1960   PCP: [facility]  Patient is from: Home  DOA: 06/27/2019 LOS: 12  Brief Narrative / Interim history: 59yo with hx bipolar 1 do, HTN, schizoaffective disorder, seizures who presented with angioedema, presumed from ACEI. Pt required emergent tracheostomy and admitted to ICU. Now on trach collar and transferred to [facility] service as of 4/22  Patient has been noncompliant with care refusing CBG monitoring some blood draws at times.  She is on dysphagia 1 diet.  Therapy recommended SNF.   Subjective: Seen and examined earlier this morning.  Has been refusing care including CBG monitoring of blood draw this morning.  She also pulled out her NG tube this morning.  She seems to be upset about food.  She also reports some pain in her neck from the trach.  She is flailing her hands.  She denies chest pain, dyspnea or GI symptoms.  Objective: Vitals:   07/09/19 2200 07/10/19 0702 07/10/19 0848 07/10/19 1124  BP: (!) 163/78  (!) 146/89   Pulse: (!) 119 82 96 97  Resp:  '20 19 20  '$ Temp:   98.1 F (36.7 C)   TempSrc:   Oral   SpO2: 91% 96% 100% 100%  Weight:        Intake/Output Summary (Last 24 hours) at 07/10/2019 1203 Last data filed at 07/09/2019 1457 Gross per 24 hour  Intake 30 ml  Output -  Net 30 ml   Filed Weights   07/06/19 0408 07/07/19 0439 07/08/19 0423  Weight: 116.2 kg 115.4 kg 114.3 kg    Examination:  GENERAL: No apparent distress. Nontoxic.  HEENT: MMM.  Vision and hearing grossly intact.  NECK: Tracheostomy. RESP: 96% on 5 L / 28% via trach.  No IWOB.  Fair aeration.  Rhonchi bilaterally. CVS:  RRR. Heart sounds normal.  ABD/GI/GU: BS present. Soft. Non tender.  MSK/EXT:  Moves extremities. No apparent deformity. No edema.  SKIN: no apparent skin lesion or wound NEURO: Awake, alert and oriented fairly.  No apparent focal neuro deficit. PSYCH: Upset.  Flailing hands.  Procedures:   4/15-emergent tracheostomy  Microbiology summarized: COVID-19 PCR negative. Influenza PCR negative. MRSA PCR negative.  Assessment & Plan: Acute hypoxemic respiratory failure due to upper airway compromise from angioedema  -PCCM following. -4/15-emergent tracheostomy -Angioedema thought to be due to ACE inhibitor's or cocaine.  UDS positive for cocaine and benzo. -Lisinopril added to allergy list.  -Continued famotidine. -Therapy recommended CIR.  CIR recommended looking into SNF. -Restarted on a steroid due to glottic edema. -Advance to dysphagia 1 diet on 4/27.  New diagnosis of DM-2 with hyperglycemia: A1c 7.8%. Recent Labs    07/09/19 1610 07/09/19 2151 07/10/19 0731  GLUCAP 189* 175* 114*  -Continue SSI-moderate -Start statin.  Essential hypertension -Lisinopril discontinued. -Continue labetalol 100 mg twice daily and HCTZ 12.5 mg daily.  Schizoaffective disorder/bipolar disorder-somewhat upset and irritable -Per report, [facility] D/[facility] July 2020 was taking: Cogentin 0.'5mg'$ , Quetiapine '100mg'$ , Depakote '500mg'$ , Topiramate '50mg'$ , and Invega sustenna (injection).Pharmacy reports she has only filled Haldol -Continue Quetiapine, as needed Haldol and Klonopin -On Invega q 30 days prior to admit -May need to involve psych  History of seizure: Depakote on her medication list back in 2017.  Not sure if she is still taking. -Not on AED since admission.  Noncompliance with care-refuses blood draw unclear at times. -Will continue to encourage -May have to involve psych if this continues to be an issue.  Morbid obesity: Body mass index is 39.47 kg/m.  With diabetes -Encourage lifestyle change to lose weight.   Nutrition Problem: Inadequate oral intake Etiology: inability to eat  Signs/Symptoms: NPO status  Interventions: MVI, Magic cup, Hormel Shake       DVT prophylaxis: Subcu Lovenox Code Status: Full code Family Communication: Patient and/or RN. Available if  any question.   Discharge barrier: Respiratory failure with tracheostomy and significant dysphagia Patient is from: Home Final disposition: Likely SNF may be in about 3 to 4 days at the earliest  Consultants:  PCCM   Sch Meds:  Scheduled Meds: . chlorhexidine gluconate (MEDLINE KIT)  15 mL Mouth Rinse BID  . Chlorhexidine Gluconate Cloth  6 each Topical Daily  . enoxaparin (LOVENOX) injection  40 mg Subcutaneous Q24H  . famotidine  20 mg Oral BID  . hydrochlorothiazide  12.5 mg Oral Daily  . insulin aspart  0-5 Units Subcutaneous QHS  . insulin aspart  0-9 Units Subcutaneous TID WC  . insulin glargine  10 Units Subcutaneous QHS  . labetalol  100 mg Oral BID  . methylPREDNISolone (SOLU-MEDROL) injection  40 mg Intravenous Q24H  . multivitamin with minerals  1 tablet Oral Daily  . nicotine  21 mg Transdermal Daily  . paliperidone  234 mg Intramuscular Once  . QUEtiapine  100 mg Oral QHS   Continuous Infusions: . sodium chloride Stopped (06/28/19 1431)   PRN Meds:.sodium chloride, clonazePAM, fentaNYL (SUBLIMAZE) injection, fentaNYL (SUBLIMAZE) injection, haloperidol lactate, hydrALAZINE, oxyCODONE-acetaminophen, polyethylene glycol, Resource ThickenUp Clear, senna-docusate  Antimicrobials: Anti-infectives (From admission, onward)   None       I have personally reviewed the following labs and images: CBC: Recent Labs  Lab 07/05/19 0322  WBC 7.5  HGB 11.3*  HCT 36.7  MCV 93.1  PLT 249   BMP &GFR Recent Labs  Lab 07/05/19 0322  NA 139  K 4.1  CL 99  CO2 30  GLUCOSE 182*  BUN 10  CREATININE 0.67  CALCIUM 8.8*   Estimated Creatinine Clearance: 101.3 mL/min (by C-G formula based on SCr of 0.67 mg/dL). Liver & Pancreas: Recent Labs  Lab 07/05/19 0322  AST 31  ALT 72*  ALKPHOS 74  BILITOT 0.3  PROT 6.1*  ALBUMIN 2.4*   No results for input(s): LIPASE, AMYLASE in the last 168 hours. No results for input(s): AMMONIA in the last 168 hours. Diabetic: No  results for input(s): HGBA1C in the last 72 hours. Recent Labs  Lab 07/09/19 0806 07/09/19 1243 07/09/19 1610 07/09/19 2151 07/10/19 0731  GLUCAP 58* 138* 189* 175* 114*   Cardiac Enzymes: No results for input(s): CKTOTAL, CKMB, CKMBINDEX, TROPONINI in the last 168 hours. No results for input(s): PROBNP in the last 8760 hours. Coagulation Profile: No results for input(s): INR, PROTIME in the last 168 hours. Thyroid Function Tests: No results for input(s): TSH, T4TOTAL, FREET4, T3FREE, THYROIDAB in the last 72 hours. Lipid Profile: No results for input(s): CHOL, HDL, LDLCALC, TRIG, CHOLHDL, LDLDIRECT in the last 72 hours. Anemia Panel: No results for input(s): VITAMINB12, FOLATE, FERRITIN, TIBC, IRON, RETICCTPCT in the last 72 hours. Urine analysis:    Component Value Date/Time   COLORURINE YELLOW 06/21/2019 0625   APPEARANCEUR CLEAR 06/21/2019 0625   LABSPEC 1.011 06/21/2019 0625   PHURINE 5.0 06/21/2019 0625   GLUCOSEU NEGATIVE 06/21/2019 0625   HGBUR SMALL (A) 06/21/2019 0625   BILIRUBINUR NEGATIVE 06/21/2019 0625   KETONESUR NEGATIVE 06/21/2019 0625   PROTEINUR NEGATIVE 06/21/2019 0625   UROBILINOGEN 0.2  12/08/2014 1937   NITRITE NEGATIVE 06/21/2019 0625   LEUKOCYTESUR SMALL (A) 06/21/2019 0625   Sepsis Labs: Invalid input(s): PROCALCITONIN, [place]  Microbiology: No results found for this or any previous visit (from the past 240 hour(s)).  Radiology Studies: DG Swallowing Func-Speech Pathology  Result Date: 07/09/2019 Objective Swallowing Evaluation: Type of Study: MBS-Modified Barium Swallow Study  Patient Details Name: [name] MRN: [id number] Date of Birth: [birth year] Today's Date: 07/09/2019 Time: SLP Start Time (ACUTE ONLY): 1150 -SLP Stop Time (ACUTE ONLY): 1209 SLP Time Calculation (min) (ACUTE ONLY): 19 min Past Medical History: Past Medical History: Diagnosis Date . Arthritis  . Bipolar 1 disorder ([place])  . Hypertension  . Obesity  . Psychiatric illness   . Schizo-affective schizophrenia ([place])  . Seizures ([place])  . Substance abuse [facility])  Past Surgical History: Past Surgical History: Procedure Laterality Date . STOMACH SURGERY   . TRACHEOSTOMY TUBE PLACEMENT N/A 06/28/2019  Procedure: TRACHEOSTOMY - 8DCT;  Surgeon: Izora Gala, MD;  Location: [facility];  Service: ENT;  Laterality: N/A; HPI: 59 year old woman who presented with angioedema and airway compromise secondary to ACE inhibitor use. The decision was made to intubate the patient for airway protection and anesthesia was called for difficult airway. Unfortunately the anesthesiologist was unable to intubate the patient and she was taken emergently to OR for tracheostomy. PMH is significant for HTN, schizoaffective disorder, bipolar disorder, seizures, and (cocaine) substance abuse.  No data recorded Assessment / Plan / Recommendation CHL IP CLINICAL IMPRESSIONS 07/09/2019 Clinical Impression Pt not currently tolerating PMV therefore MBS completed without. She demonstrateed mild oropharyngeal dysphagia marked by prolonged mastication with solid (endentulous) and minimal lingual residue. She did not aspirate however thin, nectar and honey thick penetrated laryngeal vestibule. Majority occured before the swallow due to delayed initiation of mechanisms to protect airway and penetrated but completely exited vestibule. No remarkable pharyngeal residue. She continues to have improved but continued upper airway edema and recommend thickening liquids to nectar consistency, puree (Dys 1) texture, crush meds and full supervision for strategies, safety due to impulsivity. Do not place speaking valve with meals.     SLP Visit Diagnosis Dysphagia, oropharyngeal phase (R13.12) Attention and concentration deficit following -- Frontal lobe and executive function deficit following -- Impact on safety and function Mild aspiration risk;Moderate aspiration risk   CHL IP TREATMENT RECOMMENDATION 07/09/2019 Treatment Recommendations Therapy as  outlined in treatment plan below   Prognosis 07/09/2019 Prognosis for Safe Diet Advancement (No Data) Barriers to Reach Goals -- Barriers/Prognosis Comment -- CHL IP DIET RECOMMENDATION 07/09/2019 SLP Diet Recommendations Dysphagia 1 (Puree) solids;Nectar thick liquid Liquid Administration via Cup;No straw Medication Administration Crushed with puree Compensations Minimize environmental distractions;Slow rate;Small sips/bites;Lingual sweep for clearance of pocketing Postural Changes Seated upright at 90 degrees   CHL IP OTHER RECOMMENDATIONS 07/09/2019 Recommended Consults -- Oral Care Recommendations Oral care BID Other Recommendations --   CHL IP FOLLOW UP RECOMMENDATIONS 07/09/2019 Follow up Recommendations 24 hour supervision/assistance   CHL IP FREQUENCY AND DURATION 07/09/2019 Speech Therapy Frequency (ACUTE ONLY) min 2x/week Treatment Duration 2 weeks      CHL IP ORAL PHASE 07/09/2019 Oral Phase Impaired Oral - Pudding Teaspoon -- Oral - Pudding Cup -- Oral - Honey Teaspoon -- Oral - Honey Cup Lingual/palatal residue Oral - Nectar Teaspoon WFL Oral - Nectar Cup WFL Oral - Nectar Straw WFL Oral - Thin Teaspoon Decreased bolus cohesion Oral - Thin Cup -- Oral - Thin Straw -- Oral - Puree WFL Oral - Mech Soft --  Oral - Regular Delayed oral transit Oral - Multi-Consistency -- Oral - Pill -- Oral Phase - Comment --  CHL IP PHARYNGEAL PHASE 07/09/2019 Pharyngeal Phase Impaired Pharyngeal- Pudding Teaspoon -- Pharyngeal -- Pharyngeal- Pudding Cup -- Pharyngeal -- Pharyngeal- Honey Teaspoon -- Pharyngeal -- Pharyngeal- Honey Cup Penetration/Aspiration during swallow Pharyngeal Material enters airway, remains ABOVE vocal cords then ejected out Pharyngeal- Nectar Teaspoon Penetration/Aspiration before swallow Pharyngeal Material enters airway, remains ABOVE vocal cords then ejected out Pharyngeal- Nectar Cup Delayed swallow initiation-vallecula;Penetration/Aspiration before swallow Pharyngeal Material enters airway, remains  ABOVE vocal cords then ejected out Pharyngeal- Nectar Straw WFL Pharyngeal -- Pharyngeal- Thin Teaspoon Penetration/Aspiration before swallow Pharyngeal Material enters airway, remains ABOVE vocal cords then ejected out Pharyngeal- Thin Cup Penetration/Aspiration during swallow Pharyngeal Material enters airway, remains ABOVE vocal cords then ejected out Pharyngeal- Thin Straw -- Pharyngeal -- Pharyngeal- Puree WFL Pharyngeal -- Pharyngeal- Mechanical Soft -- Pharyngeal -- Pharyngeal- Regular -- Pharyngeal -- Pharyngeal- Multi-consistency -- Pharyngeal -- Pharyngeal- Pill -- Pharyngeal -- Pharyngeal Comment --  CHL IP CERVICAL ESOPHAGEAL PHASE 07/09/2019 Cervical Esophageal Phase WFL Pudding Teaspoon -- Pudding Cup -- Honey Teaspoon -- Honey Cup -- Nectar Teaspoon -- Nectar Cup -- Nectar Straw -- Thin Teaspoon -- Thin Cup -- Thin Straw -- Puree -- Mechanical Soft -- Regular -- Multi-consistency -- Pill -- Cervical Esophageal Comment -- [place] Siren 07/09/2019, 2:23 PM  Orbie Pyo Litaker M.Ed Actor Pager [phone] Office [phone]               Taye T. [place]  If 7PM-7AM, please contact night-coverage [email or web address] Password [facility] 07/10/2019, 12:03 PM

## 2019-07-10 NOTE — Progress Notes (Signed)
  Speech Language Pathology Treatment: Dysphagia  Patient Details Name: [name] MRN: [id number] DOB: [birth year] Today's Date: 07/10/2019 Time: 9702-6378 SLP Time Calculation (min) (ACUTE ONLY): 22 min  Assessment / Plan / Recommendation Clinical Impression  ST entered as PT/OT session ending. PT reported that PMV was coughed off trach and rolled across the floor. Slowly access to upper airway has been increasing slightly as she can achieve intermittent voice with valve and gloved finger occlusion. Did not donn valve this session.  Pt expresses dislike of the puree food and that she's not eating it. Therapist observed with higher texture solids (pineapple, saltine cracker) with regular and mechanical soft. Mastication was functional with decreased cohesion. Pt refused to drink liquids without ice which is contraindicated with thickened liquids. Therapist thickened orange juice to a nectar-honey consistency mixed with ice. No s/s aspiration this session. SLP will upgrade to mechanical soft and continue nectar thick liquids.       HPI HPI: 59 year old woman who presented with angioedema and airway compromise secondary to ACE inhibitor use. The decision was made to intubate the patient for airway protection and anesthesia was called for difficult airway. Unfortunately the anesthesiologist was unable to intubate the patient and she was taken emergently to OR for tracheostomy. PMH is significant for HTN, schizoaffective disorder, bipolar disorder, seizures, and (cocaine) substance abuse.       SLP Plan  Continue with current plan of care       Recommendations  Diet recommendations: Dysphagia 3 (mechanical soft);Nectar-thick liquid Liquids provided via: Cup Medication Administration: Whole meds with puree Supervision: Patient able to self feed;Full supervision/cueing for compensatory strategies Compensations: Minimize environmental distractions;Slow rate;Small sips/bites;Lingual sweep for  clearance of pocketing Postural Changes and/or Swallow Maneuvers: Out of bed for meals;Seated upright 90 degrees      Patient may use Passy-Muir Speech Valve: Intermittently with supervision PMSV Supervision: Full MD: Please consider changing trach tube to : Cuffless;Smaller size         Oral Care Recommendations: Oral care BID Follow up Recommendations: 24 hour supervision/assistance SLP Visit Diagnosis: Dysphagia, oropharyngeal phase (R13.12) Plan: Continue with current plan of care                       Royce Macadamia 07/10/2019, 3:50 PM  Breck Coons Alfred Eckley M.Ed Nurse, children's [phone] Office [phone]

## 2019-07-11 DIAGNOSIS — Z930 Tracheostomy status: Secondary | ICD-10-CM | POA: Diagnosis not present

## 2019-07-11 LAB — RENAL FUNCTION PANEL
Albumin: 2.9 g/dL — ABNORMAL LOW (ref 3.5–5.0)
Anion gap: 10 (ref 5–15)
BUN: 16 mg/dL (ref 6–20)
CO2: 29 mmol/L (ref 22–32)
Calcium: 9.4 mg/dL (ref 8.9–10.3)
Chloride: 98 mmol/L (ref 98–111)
Creatinine, Ser: 0.74 mg/dL (ref 0.44–1.00)
GFR calc Af Amer: >60 mL/min (ref >60)
GFR calc non Af Amer: >60 mL/min (ref >60)
Glucose, Bld: 244 mg/dL — ABNORMAL HIGH (ref 70–99)
Phosphorus: 3.2 mg/dL (ref 2.5–4.6)
Potassium: 4 mmol/L (ref 3.5–5.1)
Sodium: 137 mmol/L (ref 135–145)

## 2019-07-11 LAB — CBC
HCT: 42.5 % (ref 36.0–46.0)
Hemoglobin: 13.4 g/dL (ref 12.0–15.0)
MCH: 28.6 pg (ref 26.0–34.0)
MCHC: 31.5 g/dL (ref 30.0–36.0)
MCV: 90.8 fL (ref 80.0–100.0)
Platelets: 391 10*3/uL (ref 150–400)
RBC: 4.68 MIL/uL (ref 3.87–5.11)
RDW: 12.8 % (ref 11.5–15.5)
WBC: 10.3 10*3/uL (ref 4.0–10.5)
nRBC: 0 % (ref 0.0–0.2)

## 2019-07-11 LAB — MAGNESIUM: Magnesium: 2 mg/dL (ref 1.7–2.4)

## 2019-07-11 LAB — GLUCOSE, CAPILLARY
Glucose-Capillary: 206 mg/dL — ABNORMAL HIGH (ref 70–99)
Glucose-Capillary: 224 mg/dL — ABNORMAL HIGH (ref 70–99)
Glucose-Capillary: 200 mg/dL — ABNORMAL HIGH (ref 70–99)
Glucose-Capillary: 188 mg/dL — ABNORMAL HIGH (ref 70–99)

## 2019-07-11 LAB — TSH: TSH: 0.323 u[IU]/mL — ABNORMAL LOW (ref 0.350–4.500)

## 2019-07-11 NOTE — Consult Note (Signed)
[facility] Consult   Reason for Consult:  "Help with psych meds and assessment of capacity" Referring Physician:  Dr Cyndia Skeeters Patient Identification: [name] MRN:  [id number] Principal Diagnosis: <principal problem not specified> Diagnosis:  Active Problems:   Angioedema   Acute respiratory failure ([place])   Tracheostomy status ([place])   Total Time spent with patient: 20 minutes  Subjective:   [name] is a 59 y.o. female patient.  Patient assessed by nurse practitioner.  Patient alert and oriented, patient sitting up in chair.  Patient speech difficulty to understand at times related to trach. Patient states "I am ready to go home."  Patient unable to articulate reasons for inpatient admission.  Patient states "only God knows, maybe I got bit by spider."  Patient reports "I could have a friend help with my trach care." Patient denies suicidal and homicidal ideations.  Patient denies auditory visual hallucinations.  Patient denies symptoms of paranoia. Patient states "I want to go home and lay on my bed and read my Bible." Patient reports she lives alone in an apartment in [place].  Patient denies access to weapons.  Patient currently receives disability benefits.  Patient denies alcohol and substance use.  Patient states "I was using cocaine but I quit about 2 months ago." Patient reports she is [facility], patient reports diagnosed with bipolar schizophrenia.  Patient reports stable on medications, "my medications have been working for years." Patient states "if I cannot leave today cannot leave first thing on Monday?"  Offered support and encouragement.   HPI: Patient presents with complaints of swelling to face lips and tongue.  Past Psychiatric History: Bipolar Schizophrenia  Risk to Self:  No Risk to Others:  No Prior Inpatient Therapy:  [facility], [facility], [facility] Prior Outpatient Therapy:  Current with  [facility]  Past Medical History:  Past Medical History:  Diagnosis Date  . Arthritis   . Bipolar 1 disorder ([place])   . Hypertension   . Obesity   . Psychiatric illness   . Schizo-affective schizophrenia ([place])   . Seizures ([place])   . Substance abuse [facility])     Past Surgical History:  Procedure Laterality Date  . STOMACH SURGERY    . TRACHEOSTOMY TUBE PLACEMENT N/A 06/28/2019   Procedure: TRACHEOSTOMY - 8DCT;  Surgeon: Izora Gala, MD;  Location: [facility] OR;  Service: ENT;  Laterality: N/A;   Family History:  Family History  Problem Relation Age of Onset  . Alzheimer's disease Mother   . Depression Mother    Family Psychiatric  History: Unknown Social History:  Social History   Substance and Sexual Activity  Alcohol Use Yes  . Alcohol/week: 6.0 standard drinks  . Types: 6 Cans of beer per week     Social History   Substance and Sexual Activity  Drug Use Yes  . Frequency: 5.0 times per week  . Types: "Crack" cocaine, Cocaine   Comment: crack/cocaine    Social History   Socioeconomic History  . Marital status: Single    Spouse name: Not on file  . Number of children: Not on file  . Years of education: Not on file  . Highest education level: Not on file  Occupational History  . Not on file  Tobacco Use  . Smoking status: Current Every Day Smoker    Packs/day: 1.00    Types: Cigarettes  . Smokeless tobacco: Never Used  Substance and Sexual Activity  .  Alcohol use: Yes    Alcohol/week: 6.0 standard drinks    Types: 6 Cans of beer per week  . Drug use: Yes    Frequency: 5.0 times per week    Types: "Crack" cocaine, Cocaine    Comment: crack/cocaine  . Sexual activity: Never  Other Topics Concern  . Not on file  Social History Narrative  . Not on file   Social Determinants of Health   Financial Resource Strain:   . Difficulty of Paying Living Expenses:   Food Insecurity:   . Worried About Charity fundraiser in the Last Year:   . Youth worker in the Last Year:   Transportation Needs:   . Film/video editor (Medical):   Marland Kitchen Lack of Transportation (Non-Medical):   Physical Activity:   . Days of Exercise per Week:   . Minutes of Exercise per Session:   Stress:   . Feeling of Stress :   Social Connections:   . Frequency of Communication with Friends and Family:   . Frequency of Social Gatherings with Friends and Family:   . Attends Religious Services:   . Active Member of Clubs or Organizations:   . Attends Archivist Meetings:   Marland Kitchen Marital Status:    Additional Social History:    Allergies:   Allergies  Allergen Reactions  . Lisinopril Swelling    Angioedema resulting in emergent trach    Labs:  Results for orders placed or performed during the hospital encounter of 06/27/19 (from the past 48 hour(s))  Glucose, capillary     Status: Abnormal   Collection Time: 07/09/19  4:10 PM  Result Value Ref Range   Glucose-Capillary 189 (H) 70 - 99 mg/dL    Comment: Glucose reference range applies only to samples taken after fasting for at least 8 hours.  Glucose, capillary     Status: Abnormal   Collection Time: 07/09/19  9:51 PM  Result Value Ref Range   Glucose-Capillary 175 (H) 70 - 99 mg/dL    Comment: Glucose reference range applies only to samples taken after fasting for at least 8 hours.  Glucose, capillary     Status: Abnormal   Collection Time: 07/10/19  7:31 AM  Result Value Ref Range   Glucose-Capillary 114 (H) 70 - 99 mg/dL    Comment: Glucose reference range applies only to samples taken after fasting for at least 8 hours.  Glucose, capillary     Status: Abnormal   Collection Time: 07/10/19  3:50 PM  Result Value Ref Range   Glucose-Capillary 197 (H) 70 - 99 mg/dL    Comment: Glucose reference range applies only to samples taken after fasting for at least 8 hours.  Glucose, capillary     Status: Abnormal   Collection Time: 07/10/19  9:20 PM  Result Value Ref Range   Glucose-Capillary  255 (H) 70 - 99 mg/dL    Comment: Glucose reference range applies only to samples taken after fasting for at least 8 hours.  Renal function panel     Status: Abnormal   Collection Time: 07/11/19  5:45 AM  Result Value Ref Range   Sodium 137 135 - 145 mmol/L   Potassium 4.0 3.5 - 5.1 mmol/L   Chloride 98 98 - 111 mmol/L   CO2 29 22 - 32 mmol/L   Glucose, Bld 244 (H) 70 - 99 mg/dL    Comment: Glucose reference range applies only to samples taken after fasting for at least  8 hours.   BUN 16 6 - 20 mg/dL   Creatinine, Ser 0.74 0.44 - 1.00 mg/dL   Calcium 9.4 8.9 - 10.3 mg/dL   Phosphorus 3.2 2.5 - 4.6 mg/dL   Albumin 2.9 (L) 3.5 - 5.0 g/dL   GFR calc non Af Amer >60 >60 mL/min   GFR calc Af Amer >60 >60 mL/min   Anion gap 10 5 - 15    Comment: Performed at [facility] [address], [place], [place] [address]  Magnesium     Status: None   Collection Time: 07/11/19  5:45 AM  Result Value Ref Range   Magnesium 2.0 1.7 - 2.4 mg/dL    Comment: Performed at [facility] [address] [place] [address]  CBC     Status: None   Collection Time: 07/11/19  5:45 AM  Result Value Ref Range   WBC 10.3 4.0 - 10.5 K/uL   RBC 4.68 3.87 - 5.11 MIL/uL   Hemoglobin 13.4 12.0 - 15.0 g/dL   HCT 42.5 36.0 - 46.0 %   MCV 90.8 80.0 - 100.0 fL   MCH 28.6 26.0 - 34.0 pg   MCHC 31.5 30.0 - 36.0 g/dL   RDW 12.8 11.5 - 15.5 %   Platelets 391 150 - 400 K/uL   nRBC 0.0 0.0 - 0.2 %    Comment: Performed at [facility], [place] [address] [place], [place] [address]  TSH     Status: Abnormal   Collection Time: 07/11/19  5:45 AM  Result Value Ref Range   TSH 0.323 (L) 0.350 - 4.500 uIU/mL    Comment: Performed by a 3rd Generation assay with a functional sensitivity of <=0.01 uIU/mL. Performed at [facility], [place] [address], [place],  [address]   Glucose, capillary     Status: Abnormal   Collection Time: 07/11/19  7:49 AM  Result Value Ref Range    Glucose-Capillary 206 (H) 70 - 99 mg/dL    Comment: Glucose reference range applies only to samples taken after fasting for at least 8 hours.  Glucose, capillary     Status: Abnormal   Collection Time: 07/11/19 11:23 AM  Result Value Ref Range   Glucose-Capillary 224 (H) 70 - 99 mg/dL    Comment: Glucose reference range applies only to samples taken after fasting for at least 8 hours.    Current Facility-Administered Medications  Medication Dose Route Frequency Provider Last Rate Last Admin  . 0.9 %  sodium chloride infusion   Intravenous PRN Tally Due, MD   Stopped at 06/28/19 1431  . chlorhexidine gluconate (MEDLINE KIT) (PERIDEX) 0.12 % solution 15 mL  15 mL Mouth Rinse BID Tally Due, MD   15 mL at 07/11/19 0901  . Chlorhexidine Gluconate Cloth 2 % PADS 6 each  6 each Topical Daily Rigoberto Noel, MD   6 each at 07/11/19 0902  . clonazePAM (KLONOPIN) tablet 0.5 mg  0.5 mg Oral TID PRN Wendee Beavers T, MD   0.5 mg at 07/10/19 2131  . enoxaparin (LOVENOX) injection 40 mg  40 mg Subcutaneous Q24H Neva Seat, MD   40 mg at 07/11/19 5462  . famotidine (PEPCID) tablet 20 mg  20 mg Oral BID Wendee Beavers T, MD   20 mg at 07/11/19 0900  . fentaNYL (SUBLIMAZE) injection 25 mcg  25 mcg Intravenous Q15 min PRN Corey Harold, NP   25 mcg at 06/29/19 0110  .  fentaNYL (SUBLIMAZE) injection 25-100 mcg  25-100 mcg Intravenous Q30 min PRN Corey Harold, NP   100 mcg at 07/02/19 1358  . haloperidol lactate (HALDOL) injection 2 mg  2 mg Intravenous Q6H PRN Neva Seat, MD   2 mg at 07/09/19 0157  . hydrALAZINE (APRESOLINE) injection 5 mg  5 mg Intravenous Q4H PRN Neva Seat, MD      . hydrochlorothiazide (MICROZIDE) capsule 12.5 mg  12.5 mg Oral Daily Wendee Beavers T, MD   12.5 mg at 07/11/19 0859  . insulin aspart (novoLOG) injection 0-5 Units  0-5 Units Subcutaneous QHS Mercy Riding, MD   3 Units at 07/10/19 2132  . insulin aspart (novoLOG) injection 0-9 Units  0-9 Units  Subcutaneous TID WC Mercy Riding, MD   3 Units at 07/11/19 1141  . insulin glargine (LANTUS) injection 10 Units  10 Units Subcutaneous QHS Lang Snow, FNP   10 Units at 07/10/19 2131  . labetalol (NORMODYNE) tablet 100 mg  100 mg Oral BID Wendee Beavers T, MD   100 mg at 07/11/19 0900  . multivitamin with minerals tablet 1 tablet  1 tablet Oral Daily Donne Hazel, MD   1 tablet at 07/11/19 0859  . nicotine (NICODERM CQ - dosed in mg/24 hours) patch 21 mg  21 mg Transdermal Daily Lang Snow, FNP   21 mg at 07/11/19 0858  . oxyCODONE-acetaminophen (PERCOCET/ROXICET) 5-325 MG per tablet 1 tablet  1 tablet Oral Q4H PRN Mercy Riding, MD   1 tablet at 07/10/19 2131  . paliperidone (INVEGA SUSTENNA) injection 234 mg  234 mg Intramuscular Once Donne Hazel, MD      . polyethylene glycol (MIRALAX / GLYCOLAX) packet 17 g  17 g Oral Daily PRN Wendee Beavers T, MD      . QUEtiapine (SEROQUEL) tablet 100 mg  100 mg Oral QHS Wendee Beavers T, MD   100 mg at 07/10/19 2131  . Resource ThickenUp Clear   Oral PRN Donne Hazel, MD      . senna-docusate (Senokot-S) tablet 1 tablet  1 tablet Oral BID PRN Mercy Riding, MD        Musculoskeletal: Strength & Muscle Tone: within normal limits Gait & Station: unable to assess Patient leans: N/A  Psychiatric Specialty Exam: Physical Exam  Nursing note and vitals reviewed. Constitutional: She is oriented to person, place, and time. She appears well-developed.  HENT:  Head: Normocephalic.  Cardiovascular: Normal rate.  Respiratory: Effort normal.  Musculoskeletal:        General: Normal range of motion.     Cervical back: Normal range of motion.  Neurological: She is alert and oriented to person, place, and time.  Psychiatric: Her speech is normal and behavior is normal. Judgment and thought content normal. Her mood appears anxious. Cognition and memory are impaired.    Review of Systems  Constitutional: Negative.   HENT: Negative.   Eyes: Negative.    Respiratory: Negative.   Cardiovascular: Negative.   Gastrointestinal: Negative.   Genitourinary: Negative.   Musculoskeletal: Negative.   Skin: Negative.   Neurological: Negative.   Psychiatric/Behavioral: The patient is nervous/anxious.     Blood pressure (!) 146/89, pulse (!) 102, temperature 98.4 F (36.9 C), temperature source Oral, resp. rate 18, weight 114.3 kg, last menstrual period 01/13/2012, SpO2 95 %.Body mass index is 39.47 kg/m.  General Appearance: Casual and Fairly Groomed  Eye Contact:  Good  Speech:  Normal Rate  Volume:  Decreased  Mood:  Anxious  Affect:  Congruent  Thought Process:  Coherent, Goal Directed and Descriptions of Associations: Intact  Orientation:  Full (Time, Place, and Person)  Thought Content:  Logical  Suicidal Thoughts:  No  Homicidal Thoughts:  No  Memory:  Immediate;   Good Recent;   Good Remote;   Good  Judgement:  Fair  Insight:  Lacking  Psychomotor Activity:  Normal  Concentration:  Concentration: Good and Attention Span: Good  Recall:  Good  Fund of Knowledge:  Good  Language:  Good  Akathisia:  No  Handed:  Right  AIMS (if indicated):     Assets:  Communication Skills Desire for Improvement Financial Resources/Insurance Housing Intimacy Leisure Time Physical Health Resilience Social Support  ADL's:  Impaired  Cognition:  WNL  Sleep:        Treatment Plan Summary: Patient discussed with Dr Dwyane Dee.   1.  Capacity Patient does not appear to have the capacity to assist with decision-making regarding disposition at this time.  Patient is oriented, patient does not appear to understand her current condition and reason for being inpatient.  Patient does not appear to fully understand the risk of refusing treatment at this time. Regarding disposition, please contact next of kin or appropriate hospital administrator to help with decision-making process. 2. Medication management- Continue current Seroquel 100 mg nightly.   Consider patient did not appear to receive monthly Invega Sustenna injection.     Disposition: No evidence of imminent risk to self or others at present.   Patient does not meet criteria for psychiatric inpatient admission. Supportive therapy provided about ongoing stressors.  Emmaline Kluver, FNP 07/11/2019 4:07 PM

## 2019-07-11 NOTE — Progress Notes (Signed)
Pulmonary Progress Note  Subjective/interval Still working w/ SLP. Note on 28th shows improved voice BUT does have sig air rush when removing PMV and does report more shortness of breath w/ PMV in place.   Objective Blood Pressure 128/68 (BP Location: Left Arm)   Pulse 94   Temperature 98 F (36.7 C) (Oral)   Respiration 16   Weight 114.3 kg   Last Menstrual Period 01/13/2012   Oxygen Saturation 92%   Body Mass Index 39.47 kg/m   HENT NCAT no obvious tongue swelling at this point. Sp still very raspy and of poor quality. She can tolerate PMV but does not have endurance to speak more than 2-3 word phrases w/ rather significant rush of air upon PMV removal.  Pulm clear no accessory use Card RRR abd not tender  Ext warm and dry  Neuro intact   Assessment  Acute hypoxemic respiratory failure due to angioedema s/p emergent trach- improving but lingering glottic edema has been precluding downsizing progress. She does still seem to have evidence of airway obstruction BUT seems more subglottic at this point as evidenced by dyspnea and air rush noted in relation to PMV valve but absent when PMV removed. Tongue wise she looks good. Angio edema seems improved. I think we should go ahead and down-size to 4 cuffless.   Plan Down-size to 4 cuffless.  Re-assess PMV tolerance. If tolerates well (no shortness of breath/no rush of air from trach when PMV removed) then would try 24 hrs w/ PMV-->if tolerates this well then proceed to capping trials. If does not tolerate this well can give more time but could potentially be discharged w/ ENT follow up alternatively. Seems reasonable to see how she does over the weekend before we decide.  Stop steroids.   Will cont to follow   Simonne Martinet ACNP-BC [facility] Pulmonary/Critical Care Pager # [phone] OR # [phone] if no answer

## 2019-07-11 NOTE — Progress Notes (Signed)
Calorie Count Note  48-hour calorie count ordered and started on 4/28 at dinner meal.  Diet: Dysphagia 3 with nectar-thick liquids Supplements: - Vital Cuisine Shake TID, each supplement provides 520 kcal and 22 grams of protein  Day 1: 4/28 Dinner: 601 kcal, 21 grams protein 4/29 Breakfast: 356 kcal, 10 grams protein 4/29 Lunch: 335 kcal, 8 grams protein Supplements: 0 kcal, 0 grams protein  Total 24-hour intake: 1292 kcal (76% of minimum estimated needs)  39 grams protein (46% of minimum estimated needs)  Nutrition Diagnosis: Inadequate oral intake related to inability to eat as evidenced by NPO status.  Goal: Patient will meet greater than or equal to 90% of their needs.  Intervention: - Hormel Shakes TID - Continue 48-hour calorie count - MVI with minerals daily   Earma Reading, MS, RD, LDN Inpatient Clinical Dietitian Pager: [phone] Weekend/After Hours: [phone]

## 2019-07-11 NOTE — Procedures (Signed)
Tracheostomy tube change: Informed verbal consent was obtained after explaining the risks (including bleeding and infection), benefits and alternatives of the procedure. Verbal timeout was performed prior to the procedure. The old  #6 cuffed trach was carefully removed. the tracheostomy site appeared: unremarkable. A new # 4 Cuffless trach was easily placed in the tracheostomy stoma and secured with velcro trach ties. The tracheostomy was patent, good color change observed via EZ-CAP, and the patient was easily able to voice with finger occlusion and tolerated the procedure well with no immediate complications.    Yousif Edelson E Kit Mollett ACNP-BC [place] Pulmonary/Critical Care Pager # [phone] OR # [phone] if no answer  

## 2019-07-11 NOTE — Progress Notes (Signed)
Inpatient Diabetes Program Recommendations  AACE/ADA: New Consensus Statement on Inpatient Glycemic Control (2015)  Target Ranges:  Prepandial:   less than 140 mg/dL      Peak postprandial:   less than 180 mg/dL (1-2 hours)      Critically ill patients:  140 - 180 mg/dL   Lab Results  Component Value Date   GLUCAP 206 (H) 07/11/2019   HGBA1C 7.8 (H) 06/30/2019    Review of Glycemic Control   Current orders for Inpatient glycemic control: Lantus 10 units QHS Novolog 0-9 units tidwc and 0-5 units QHS  On Dys 3 diet and nutritional supplements 244, 206 mg/dL this am.  Inpatient Diabetes Program Recommendations:     Increase Lantus to 12 units QHS May need Novolog 2-3 units if post-prandials > 180 mg/dL.  Will continue to follow.  Thank you. Ailene Ards, RD, LDN, CDE Inpatient Diabetes Coordinator [phone]

## 2019-07-11 NOTE — Progress Notes (Signed)
Nutrition Follow-up  DOCUMENTATION CODES:   Obesity unspecified  INTERVENTION:   - Continue Vital Cuisine Shake TID with meals, each supplement provides 520 kcal and 22 grams of protein  - Continue MVI with minerals daily  - 48-hour calorie count  NUTRITION DIAGNOSIS:   Inadequate oral intake related to inability to eat as evidenced by NPO status.  Progressing, pt now on Dysphagia 3 diet with nectar-thick liquids  GOAL:   Patient will meet greater than or equal to 90% of their needs  Progressing  MONITOR:   PO intake, Supplement acceptance, Diet advancement, Labs, Weight trends  REASON FOR ASSESSMENT:   Consult Assessment of nutrition requirement/status, Calorie Count  ASSESSMENT:   Pt with PMH of bipolar 1 disorder, HTN, obesity, sxhizo-affective schizophrenia, sz, and substance abuse admitted 4/15 for angioedema and had emergent trach placed.  4/15 - admitted, emergent trach 4/16 - Cortrak placed 4/20 - trach downsized 4/21 - Cortrak replaced due to clog 4/27 - pt removed Cortrak, MBSS with diet advanced to Dysphagia 1 with nectar-thick liquids 4/28 - diet advanced to Dysphagia 3 with nectar-thick liquids  Per RN note yesterday, pt consumed 60% of dinner yesterday.  Spoke with RN who reports pt did fairly well with breakfast meal.  Please see Day 1 Calorie Count note for additional details.  Admit weight: 115.8 kg Current weight: 114.3 kg (07/08/19)  Medications reviewed and include: pepcid, SSI, Lantus 10 units daily, MVI with minerals  Labs reviewed. CBG's: 114-255 x 24 hours  Diet Order:   Diet Order            DIET DYS 3 Room service appropriate? Yes with Assist; Fluid consistency: Nectar Thick  Diet effective now              EDUCATION NEEDS:   No education needs have been identified at this time  Skin:  Skin Assessment: Reviewed RN Assessment  Last BM:  07/09/19  Height:   Ht Readings from Last 1 Encounters:  06/21/19 5\' 7"   (1.702 m)    Weight:   Wt Readings from Last 1 Encounters:  07/08/19 114.3 kg    Ideal Body Weight:  61.3 kg  BMI:  Body mass index is 39.47 kg/m.  Estimated Nutritional Needs:   Kcal:  1700-1900 kcals  Protein:  85-100 grams  Fluid:  >/= 1.7 L    07/10/19, MS, RD, LDN Inpatient Clinical Dietitian Pager: [phone] Weekend/After Hours: [phone]

## 2019-07-11 NOTE — Progress Notes (Signed)
PROGRESS NOTE  [name] SJG:[id number] DOB: 1961-02-22   PCP: [facility], [facility]  Patient is from: Home  DOA: 06/27/2019 LOS: 72  Brief Narrative / Interim history: 59yo with hx bipolar 1 do, HTN, schizoaffective disorder, seizures who presented with angioedema, presumed from ACEI. Pt required emergent tracheostomy and admitted to ICU. Now on trach collar and transferred to [facility] service as of 4/22.  PCCM following for trach.  Patient has been noncompliant with care refusing CBG monitoring some blood draws at times.  She is on dysphagia 1 diet.  Therapy recommended SNF.   Subjective: Seen and examined earlier this morning.  Continues to refuse care intermittently.  She says she likes to go home.  Her speech is difficult to understand even with muir valve.  Also reports pain from the trach.  She denies chest pain abdominal pain.  Objective: Vitals:   07/10/19 2145 07/11/19 0734 07/11/19 0851 07/11/19 1111  BP:   (!) 146/89   Pulse:  94 (!) 109 86  Resp:  '16 18 18  '$ Temp:   98.4 F (36.9 C)   TempSrc:   Oral   SpO2: 98% 92% 100% 100%  Weight:        Intake/Output Summary (Last 24 hours) at 07/11/2019 1315 Last data filed at 07/11/2019 0400 Gross per 24 hour  Intake 240 ml  Output --  Net 240 ml   Filed Weights   07/06/19 0408 07/07/19 0439 07/08/19 0423  Weight: 116.2 kg 115.4 kg 114.3 kg    Examination:  GENERAL: No apparent distress.  Nontoxic. HEENT: MMM.  Vision and hearing grossly intact.  NECK: Trach collar. RESP: 98% on 5 L / 28%.  No IWOB.  Rhonchi bilaterally. CVS:  RRR. Heart sounds normal.  ABD/GI/GU: Bowel sounds present. Soft. Non tender.  MSK/EXT:  Moves extremities. No apparent deformity. No edema.  SKIN: no apparent skin lesion or wound NEURO: Awake, alert and oriented appropriately.  No apparent focal neuro deficit. PSYCH: Calm. Normal affect.  Procedures:  4/15-emergent tracheostomy  Microbiology summarized: COVID-19 PCR  negative. Influenza PCR negative. MRSA PCR negative.  Assessment & Plan: Acute hypoxemic respiratory failure due to upper airway compromise from angioedema  -Angioedema thought to be due to ACE inhibitor's or cocaine.  UDS positive for cocaine and benzo. -Lisinopril added to allergy list.  -PCCM following.  -4/15-emergent tracheostomy.  Downsize trach to #4 cuffless on 4/29. -Continued famotidine. -Therapy recommended CIR.  CIR recommended looking into SNF. -Diet advanced to dysphagia-3 on 4/28. -Discontinue steroid.  New diagnosis of DM-2 with hyperglycemia: A1c 7.8%. Recent Labs    07/10/19 2120 07/11/19 0749 07/11/19 1123  GLUCAP 255* 206* 224*  -Continue SSI-moderate -Add Lantus 5 units twice daily. -Continue statin.  Essential hypertension: Normotensive. -Lisinopril discontinued due to angioedema. -Continue labetalol 100 mg twice daily and HCTZ 12.5 mg daily.  Schizoaffective disorder/bipolar disorder-somewhat upset and irritable -Per report, [facility] July 2020 was taking: Cogentin 0.'5mg'$ , Quetiapine '100mg'$ , Depakote '500mg'$ , Topiramate '50mg'$ , and Invega sustenna (injection).Pharmacy reports she has only filled Haldol -Continue Quetiapine, as needed Haldol and Klonopin -On Invega q 30 days prior to admit -Psych consult placed on 4/29.  History of seizure: Depakote on her medication list back in 2017.  Not sure if she is still taking. -Not on AED since admission.  Noncompliance with care-refuses blood draw unclear at times. -Encourage compliance. -Psych consulted.  Morbid obesity: Body mass index is 39.47 kg/m.  Has diabetes. -Encourage lifestyle change to lose weight.  Nutrition Problem: Inadequate oral intake Etiology: inability to eat  Signs/Symptoms: NPO status  Interventions: MVI, Magic cup, Hormel Shake       DVT prophylaxis: Subcu Lovenox Code Status: Full code Family Communication: Patient and/or RN. Available if any question.   Status  is: Inpatient  Remains inpatient appropriate because:Unsafe d/c plan and clearance by consultants.   Dispo: The patient is from: Home              Anticipated d/c is to: SNF              Anticipated d/c date is: 1 day              Patient currently is medically stable to d/c.        Consultants:  PCCM Psychiatry   Sch Meds:  Scheduled Meds: . chlorhexidine gluconate (MEDLINE KIT)  15 mL Mouth Rinse BID  . Chlorhexidine Gluconate Cloth  6 each Topical Daily  . enoxaparin (LOVENOX) injection  40 mg Subcutaneous Q24H  . famotidine  20 mg Oral BID  . hydrochlorothiazide  12.5 mg Oral Daily  . insulin aspart  0-5 Units Subcutaneous QHS  . insulin aspart  0-9 Units Subcutaneous TID WC  . insulin glargine  10 Units Subcutaneous QHS  . labetalol  100 mg Oral BID  . multivitamin with minerals  1 tablet Oral Daily  . nicotine  21 mg Transdermal Daily  . paliperidone  234 mg Intramuscular Once  . QUEtiapine  100 mg Oral QHS   Continuous Infusions: . sodium chloride Stopped (06/28/19 1431)   PRN Meds:.sodium chloride, clonazePAM, fentaNYL (SUBLIMAZE) injection, fentaNYL (SUBLIMAZE) injection, haloperidol lactate, hydrALAZINE, oxyCODONE-acetaminophen, polyethylene glycol, Resource ThickenUp Clear, senna-docusate  Antimicrobials: Anti-infectives (From admission, onward)   None       I have personally reviewed the following labs and images: CBC: Recent Labs  Lab 07/05/19 0322 07/11/19 0545  WBC 7.5 10.3  HGB 11.3* 13.4  HCT 36.7 42.5  MCV 93.1 90.8  PLT 249 391   BMP &GFR Recent Labs  Lab 07/05/19 0322 07/11/19 0545  NA 139 137  K 4.1 4.0  CL 99 98  CO2 30 29  GLUCOSE 182* 244*  BUN 10 16  CREATININE 0.67 0.74  CALCIUM 8.8* 9.4  MG  --  2.0  PHOS  --  3.2   Estimated Creatinine Clearance: 101.3 mL/min (by C-G formula based on SCr of 0.74 mg/dL). Liver & Pancreas: Recent Labs  Lab 07/05/19 0322 07/11/19 0545  AST 31  --   ALT 72*  --   ALKPHOS 74   --   BILITOT 0.3  --   PROT 6.1*  --   ALBUMIN 2.4* 2.9*   No results for input(s): LIPASE, AMYLASE in the last 168 hours. No results for input(s): AMMONIA in the last 168 hours. Diabetic: No results for input(s): HGBA1C in the last 72 hours. Recent Labs  Lab 07/10/19 0731 07/10/19 1550 07/10/19 2120 07/11/19 0749 07/11/19 1123  GLUCAP 114* 197* 255* 206* 224*   Cardiac Enzymes: No results for input(s): CKTOTAL, CKMB, CKMBINDEX, TROPONINI in the last 168 hours. No results for input(s): PROBNP in the last 8760 hours. Coagulation Profile: No results for input(s): INR, PROTIME in the last 168 hours. Thyroid Function Tests: Recent Labs    07/11/19 0545  TSH 0.323*   Lipid Profile: No results for input(s): CHOL, HDL, LDLCALC, TRIG, CHOLHDL, LDLDIRECT in the last 72 hours. Anemia Panel: No results for input(s): VITAMINB12, FOLATE, FERRITIN, TIBC,  IRON, RETICCTPCT in the last 72 hours. Urine analysis:    Component Value Date/Time   COLORURINE YELLOW 06/21/2019 0625   APPEARANCEUR CLEAR 06/21/2019 0625   LABSPEC 1.011 06/21/2019 0625   PHURINE 5.0 06/21/2019 0625   GLUCOSEU NEGATIVE 06/21/2019 0625   HGBUR SMALL (A) 06/21/2019 0625   BILIRUBINUR NEGATIVE 06/21/2019 0625   KETONESUR NEGATIVE 06/21/2019 0625   PROTEINUR NEGATIVE 06/21/2019 0625   UROBILINOGEN 0.2 12/08/2014 1937   NITRITE NEGATIVE 06/21/2019 0625   LEUKOCYTESUR SMALL (A) 06/21/2019 0625   Sepsis Labs: Invalid input(s): PROCALCITONIN, [place]  Microbiology: No results found for this or any previous visit (from the past 240 hour(s)).  Radiology Studies: No results found.   Kimetha Trulson T. [place]  If 7PM-7AM, please contact night-coverage [email or web address] Password [email or web address] 07/11/2019, 1:15 PM

## 2019-07-12 DIAGNOSIS — J9601 Acute respiratory failure with hypoxia: Secondary | ICD-10-CM | POA: Diagnosis not present

## 2019-07-12 DIAGNOSIS — Z930 Tracheostomy status: Secondary | ICD-10-CM | POA: Diagnosis not present

## 2019-07-12 DIAGNOSIS — T783XXA Angioneurotic edema, initial encounter: Secondary | ICD-10-CM | POA: Diagnosis not present

## 2019-07-12 LAB — CBC
HCT: 39.3 % (ref 36.0–46.0)
Hemoglobin: 12.5 g/dL (ref 12.0–15.0)
MCH: 29.1 pg (ref 26.0–34.0)
MCHC: 31.8 g/dL (ref 30.0–36.0)
MCV: 91.4 fL (ref 80.0–100.0)
Platelets: 424 10*3/uL — ABNORMAL HIGH (ref 150–400)
RBC: 4.3 MIL/uL (ref 3.87–5.11)
RDW: 13 % (ref 11.5–15.5)
WBC: 7.8 10*3/uL (ref 4.0–10.5)
nRBC: 0 % (ref 0.0–0.2)

## 2019-07-12 LAB — RENAL FUNCTION PANEL
Albumin: 2.8 g/dL — ABNORMAL LOW (ref 3.5–5.0)
Anion gap: 8 (ref 5–15)
BUN: 13 mg/dL (ref 6–20)
CO2: 31 mmol/L (ref 22–32)
Calcium: 9 mg/dL (ref 8.9–10.3)
Chloride: 98 mmol/L (ref 98–111)
Creatinine, Ser: 0.72 mg/dL (ref 0.44–1.00)
GFR calc Af Amer: >60 mL/min (ref >60)
GFR calc non Af Amer: >60 mL/min (ref >60)
Glucose, Bld: 134 mg/dL — ABNORMAL HIGH (ref 70–99)
Phosphorus: 3.6 mg/dL (ref 2.5–4.6)
Potassium: 3.8 mmol/L (ref 3.5–5.1)
Sodium: 137 mmol/L (ref 135–145)

## 2019-07-12 LAB — T4, FREE: Free T4: 0.96 ng/dL (ref 0.61–1.12)

## 2019-07-12 LAB — MAGNESIUM: Magnesium: 1.9 mg/dL (ref 1.7–2.4)

## 2019-07-12 LAB — GLUCOSE, CAPILLARY
Glucose-Capillary: 131 mg/dL — ABNORMAL HIGH (ref 70–99)
Glucose-Capillary: 186 mg/dL — ABNORMAL HIGH (ref 70–99)
Glucose-Capillary: 154 mg/dL — ABNORMAL HIGH (ref 70–99)

## 2019-07-12 NOTE — Progress Notes (Signed)
  Speech Language Pathology Treatment: [name] Speaking valve  Patient Details Name: [name] MRN: [id number] DOB: 09-15-1960 Today's Date: 07/12/2019 Time: 5361-4431 SLP Time Calculation (min) (ACUTE ONLY): 30 min  Assessment / Plan / Recommendation Clinical Impression  Session focused on pt's speech with PMV donned and dysphagia during functional activities of supervising pt to toilet, pericare and changing gowns. [name] was changed to cuffless #4 4/29 and pt vocalizing in short phrases. Increased airflow to vocal cords and effectively exhaled via nose/mouth without significant pressure buildup behind speaking valve- some inspiratory wheeze and pressure detected at end of session when valve doffed. Intensity of voice is adequate with hoarse quality. Leash attached to valve and trach collar and notified RN pt may wear during waking hours. Remove when sleeping.   Pt consumed nectar thick orange juice and continues with large sips and cued for smaller. No s/s aspiration present. Plan for repeat MBS however would prefer her trach to be capped or decannulated. Will follow.    HPI HPI: 59 year old woman who presented with angioedema and airway compromise secondary to ACE inhibitor use. The decision was made to intubate the patient for airway protection and anesthesia was called for difficult airway. Unfortunately the anesthesiologist was unable to intubate the patient and she was taken emergently to OR for tracheostomy. PMH is significant for HTN, schizoaffective disorder, bipolar disorder, seizures, and (cocaine) substance abuse.       SLP Plan  Continue with current plan of care       Recommendations  Diet recommendations: Dysphagia 3 (mechanical soft);Nectar-thick liquid Liquids provided via: Cup Medication Administration: Whole meds with puree Supervision: Patient able to self feed;Full supervision/cueing for compensatory strategies Compensations: Minimize environmental  distractions;Slow rate;Small sips/bites;Lingual sweep for clearance of pocketing Postural Changes and/or Swallow Maneuvers: Seated upright 90 degrees      Patient may use Passy-Muir Speech Valve: During all waking hours (remove during sleep) PMSV Supervision: Intermittent         Oral Care Recommendations: Oral care BID Follow up Recommendations: 24 hour supervision/assistance SLP Visit Diagnosis: Aphonia (R49.1);Dysphagia, unspecified (R13.10) Plan: Continue with current plan of care                       Royce Macadamia 07/12/2019, 10:48 AM Breck Coons Lonell Face.Ed Nurse, children's [phone] Office [phone]

## 2019-07-12 NOTE — Progress Notes (Signed)
Patient refused to have CBG and assessment she stated that she is not allowing any one to poke or look at her. She stated that we are trying to kill her I tried to orient her to hospital and that it is necessary to do vital signs and check blood sugars to treatment. She then stated that she is going to sleep and for me to get the hell out and leave her alone. She stated that she is going home in the morning and going to sleep she then covered he head and I left the room.

## 2019-07-12 NOTE — TOC Initial Note (Signed)
Transition of Care [facility]) - Initial/Assessment Note    Patient Details  Name: [name] MRN: [id number] Date of Birth: [birth year]  Transition of [facility]) CM/SW Contact:    Baldemar Lenis, LCSW Phone Number: 07/12/2019, 12:00 PM  Clinical Narrative:   CSW contacted the numbers listed in the patient's chart; only person who answered was Crystal, who is the patient's daughter. Per Crystal, the patient used to have guardianship through the state but they released her, to the family's frustration as the patient has never been able to make her own decisions due to her mental health. Crystal in agreement that the patient needs a guardian, and she says that she and her brother will be willing to file the petition for guardianship. CSW provided Crystal with information on how to do that, and she said she will look into it. Crystal and her brother are able to make decisions as next of kin in the interim, and are in agreement that the patient needs nursing home placement. CSW to work on SNF referral and fax out, will follow.              Expected Discharge Plan: Skilled Nursing Facility Barriers to Discharge: Awaiting State Approval (PASRR), Continued Medical Work up, Other (comment)(new trach, placed 4/16; needs to be 37 days old prior to SNF placement)   Patient Goals and CMS Choice Patient states their goals for this hospitalization and ongoing recovery are:: patient unable to participate in goal setting as she is unable to make her own medical decisions CMS Medicare.gov Compare Post Acute Care list provided to:: Patient Represenative (must comment) Choice offered to / list presented to : Adult Children  Expected Discharge Plan and Services Expected Discharge Plan: Skilled Nursing Facility     Post Acute Care Choice: Skilled Nursing Facility Living arrangements for the past 2 months: Group Home                                      Prior Living Arrangements/Services Living  arrangements for the past 2 months: Group Home Lives with:: Facility Resident Patient language and need for interpreter reviewed:: No Do you feel safe going back to the place where you live?: Yes      Need for Family Participation in Patient Care: Yes (Comment) Care giver support system in place?: No (comment)   Criminal Activity/Legal Involvement Pertinent to Current Situation/Hospitalization: No - Comment as needed  Activities of Daily Living      Permission Sought/Granted Permission sought to share information with : Facility Medical sales representative, Family Supports Permission granted to share information with : Yes, Verbal Permission Granted  Share Information with NAME: Crystal  Permission granted to share info w AGENCY: SNF  Permission granted to share info w Relationship: Daughter     Emotional Assessment Appearance:: Appears stated age Attitude/Demeanor/Rapport: Unable to Assess Affect (typically observed): Unable to Assess Orientation: : Oriented to  Time, Oriented to Place, Oriented to Self Alcohol / Substance Use: Not Applicable Psych Involvement: Yes (comment)  Admission diagnosis:  Angioedema [T78.3XXA] Patient Active Problem List   Diagnosis Date Noted  . Acute respiratory failure (HCC)   . Tracheostomy status (HCC)   . Angioedema 06/28/2019  . Substance induced mood disorder (HCC) 12/27/2015  . Fever, unspecified 09/13/2015  . Leukocytosis 09/13/2015  . Acute encephalopathy 09/13/2015  . Substance abuse (HCC) 09/12/2015  . Hallucination   . Homicidal ideation   .  Schizophrenia, unspecified type ([place])   . Schizoaffective disorder, bipolar type ([place])   . Schizoaffective disorder ([place]) 03/29/2013  . Cocaine abuse ([place]) 06/30/2011  . Non-compliant patient 06/29/2011  . Hypertension 06/29/2011  . Alcohol dependence ([place]) 06/28/2011  . Polysubstance dependence ([place]) 04/03/2011  . Psychiatric problem 04/03/2011  . Polysubstance abuse ([place]) 06/21/2010  .  Schizophrenia ([place]) 06/21/2010   PCP:  [facility], [facility]:   [place], [address] [place] [address] Phone: [phone] Fax: [phone]     Social Determinants of Health (SDOH) Interventions    Readmission Risk Interventions No flowsheet data found.

## 2019-07-12 NOTE — Progress Notes (Signed)
No significant issues to report from overnight. Patient compliant with medications and nursing interventions overnight.

## 2019-07-12 NOTE — Progress Notes (Signed)
Physical Therapy Treatment Patient Details Name: [name] MRN: [id number] DOB: 1960/09/08 Today's Date: 07/12/2019    History of Present Illness Pt is a 59 year old woman who presented with angioedema and airway compromise secondary to ACE inhibitor use. The decision was made to intubate the patient for airway protection and anesthesia was called for difficult airway. Unfortunately the anesthesiologist was unable to intubate the patient and she was taken emergently to OR for tracheostomy. PMH is significant for HTN, schizoaffective disorder, bipolar disorder, seizures, and (cocaine) substance abuse.      PT Comments    Pt requires increased time and max cues for safety and precaution awareness (need for O2, swallowing precautions, line/lead management).  Pt very impulsive, but mobility is improving with cues for safety.  VSS throughout session on 28% FiO2.  Cont POC.     Follow Up Recommendations  Supervision/Assistance - 24 hour;SNF     Equipment Recommendations  Rolling walker with 5" wheels;3in1 (PT)    Recommendations for Other Services       Precautions / Restrictions Precautions Precautions: Fall Precaution Comments: Pt extremely impulsive. Poor safety awareness.     Mobility  Bed Mobility               General bed mobility comments: in chair  Transfers Overall transfer level: Needs assistance Equipment used: Rolling walker (2 wheeled) Transfers: Sit to/from [employer] transfer comment: sit to stand x 5; max cues for safety and pacing; supervision with toielting ADLs  Ambulation/Gait Ambulation/Gait assistance: Min assist Gait Distance (Feet): 100 Feet Assistive device: Rolling walker (2 wheeled) Gait Pattern/deviations: Step-through pattern Gait velocity: decreased   General Gait Details: no overt LOB but impulsive and needing assist with RW in turns and max cues for safety   Stairs              Wheelchair Mobility    Modified Rankin (Stroke Patients Only)       Balance Overall balance assessment: Needs assistance Sitting-balance support: Feet supported Sitting balance-Leahy Scale: Good     Standing balance support: No upper extremity supported;During functional activity   Standing balance comment: Pt able to perform task at sink without LOB                            Cognition Arousal/Alertness: Awake/alert Behavior During Therapy: Impulsive Overall Cognitive Status: No family/caregiver present to determine baseline cognitive functioning                                 General Comments: Pt extremely impulsive.  Pt frequently asking for OJ not thickened- decreased understanding of precautions.      Exercises      General Comments General comments (skin integrity, edema, etc.): pt on 28% O2 trach collar with stable O2 sats      Pertinent Vitals/Pain Pain Assessment: No/denies pain    Home Living                      Prior Function            PT Goals (current goals can now be found in the care plan section) Progress towards PT goals: Progressing toward goals    Frequency    Min 2X/week      PT Plan Frequency needs to be updated  Co-evaluation              AM-PAC PT "6 Clicks" Mobility   Outcome Measure  Help needed turning from your back to your side while in a flat bed without using bedrails?: A Little Help needed moving from lying on your back to sitting on the side of a flat bed without using bedrails?: A Little Help needed moving to and from a bed to a chair (including a wheelchair)?: A Little Help needed standing up from a chair using your arms (e.g., wheelchair or bedside chair)?: A Little Help needed to walk in hospital room?: A Little Help needed climbing 3-5 steps with a railing? : A Lot 6 Click Score: 17    End of Session Equipment Utilized During Treatment: Gait  belt;Oxygen Activity Tolerance: Patient tolerated treatment well Patient left: in chair;with call bell/phone within reach;with chair alarm set         Time: 1194-1740 PT Time Calculation (min) (ACUTE ONLY): 25 min  Charges:  $Gait Training: 8-22 mins $Therapeutic Activity: 8-22 mins                     Maggie Font, PT Acute Rehab Services Pager [phone] [facility] [phone] [facility] [phone]    Karlton Lemon 07/12/2019, 12:49 PM

## 2019-07-12 NOTE — Progress Notes (Signed)
PROGRESS NOTE  [name] YME:[id number] DOB: [birth year]   PCP: Medicine, [facility]  Patient is from: Home  DOA: 06/27/2019 LOS: 41  Brief Narrative / Interim history: 58yo with hx bipolar 1 do, HTN, schizoaffective disorder, seizures who presented with angioedema, presumed from ACEI. Pt required emergent tracheostomy and admitted to ICU. Now on trach collar and transferred to [facility] service as of 4/22.     PCCM following for trach.  Trach downsized to #4 cuffless on 4/29.  Lingering glottic edema precluding downsizing progress.  PCCM planning to start capping trials next week.  Patient has been noncompliant with care refusing CBG monitoring some blood draws at times.  She is on dysphagia 3 diet.   Psych consulted for assistance with his psych medication and capacity assessment.  She was deemed to have no capacity due to poor insight into his situation.  Therapy recommended SNF.   Subjective: Seen and examined earlier this morning.  No major events overnight or this morning.  Complains about the taste of her medications and food.  She states "I am ready to go home" but hasn't threatened to leave so far.  Objective: Vitals:   07/11/19 1900 07/11/19 2351 07/12/19 0724 07/12/19 0759  BP:  126/79  (!) 133/98  Pulse: 96 76 79 100  Resp: '18 18 18 19  '$ Temp:  98 F (36.7 C)  98 F (36.7 C)  TempSrc:  Axillary  Oral  SpO2: 100% 100% 96% 98%  Weight:        Intake/Output Summary (Last 24 hours) at 07/12/2019 1025 Last data filed at 07/11/2019 2300 Gross per 24 hour  Intake 240 ml  Output --  Net 240 ml   Filed Weights   07/06/19 0408 07/07/19 0439 07/08/19 0423  Weight: 116.2 kg 115.4 kg 114.3 kg    Examination:  GENERAL: No apparent distress.  Nontoxic. HEENT: MMM.  Vision and hearing grossly intact.  NECK: Trach collar. RESP: 98% on 5 L / 28%.  No IWOB.  Rhonchi bilaterally. CVS:  RRR. Heart sounds normal.  ABD/GI/GU: Bowel sounds present. Soft. Non  tender.  MSK/EXT:  Moves extremities. No apparent deformity. No edema.  SKIN: no apparent skin lesion or wound NEURO: Awake, alert and oriented appropriately.  No apparent focal neuro deficit. PSYCH: Calm. Normal affect.  GENERAL: No apparent distress.  Nontoxic. HEENT: MMM.  Vision and hearing grossly intact.  NECK: Trach collar in place. RESP: 100% on 5 L / 28%.  No IWOB.  Fair aeration with upper airway sound CVS:  RRR. Heart sounds normal.  ABD/GI/GU: Bowel sounds present. Soft. Non tender.  MSK/EXT:  Moves extremities. No apparent deformity. No edema.  SKIN: no apparent skin lesion or wound NEURO: Awake, alert and oriented appropriately.  No apparent focal neuro deficit. PSYCH: Calm.  Limited insight.  Get irritated easily but calms down quickly.  Procedures:  4/15-emergent tracheostomy  Microbiology summarized: COVID-19 PCR negative. Influenza PCR negative. MRSA PCR negative.  Assessment & Plan: Acute hypoxemic respiratory failure due to upper airway compromise from angioedema  -Angioedema thought to be due to ACE inhibitor's or cocaine.  UDS positive for cocaine and benzo. -Lisinopril added to allergy list.  -PCCM following-4/15-emergent trach>>> #4 cuffless on 4/29>> hope to start capping trials next week -Continued famotidine. -Therapy recommended CIR.  CIR recommended looking into SNF. -Diet advanced to dysphagia-3 on 4/28.  New diagnosis of DM-2 with hyperglycemia: A1c 7.8%.  Hyperglycemia improved after stopping steroid. Recent Labs    07/11/19  1630 07/11/19 2146 07/12/19 0752  GLUCAP 200* 188* 131*  -Continue SSI-moderate and Lantus 5 units twice daily -Continue statin.   Essential hypertension: Normotensive. -Lisinopril discontinued due to angioedema. -Continue labetalol 100 mg twice daily and HCTZ 12.5 mg daily.  Schizoaffective disorder/bipolar disorder-somewhat upset and irritable -Per report, [facility] July 2020 was taking: Cogentin 0.'5mg'$ ,  Quetiapine '100mg'$ , Depakote '500mg'$ , Topiramate '50mg'$ , and Invega sustenna (injection).Pharmacy reports she has only filled Haldol -Continue Quetiapine, as needed Haldol and Klonopin -On Invega q 30 days prior to admit -Appreciate psych input-deemed to lack capacity.  Her legal guardian seems to have changed her phone number.  Will involve CSW.  History of seizure: Depakote on her medication list back in 2017.  Not sure if she is still taking. -Not on AED since admission.  Noncompliance with care-refuses blood draw and meds at times.  Deemed to lack capacity by psych. -Encourage compliance.  Morbid obesity: Body mass index is 39.47 kg/m.  Has diabetes. -Encourage lifestyle change to lose weight.   Nutrition Problem: Inadequate oral intake Etiology: inability to eat  Signs/Symptoms: NPO status  Interventions: MVI, Magic cup, Hormel Shake       DVT prophylaxis: Subcu Lovenox Code Status: Full code Family Communication: Attempted to call Elmyra Ricks (legal guardian) for update but the phone number listed in the chart is no longer hurts.  Attempted to call his son and daughter but no answer.  Status is: Inpatient  Remains inpatient appropriate because:Unsafe d/c plan and clearance by consultants.   Dispo: The patient is from: Home              Anticipated d/c is to: SNF              Anticipated d/c date is: > 3 days              Patient currently is not medically stable to d/c.        Consultants:  PCCM Psychiatry   Sch Meds:  Scheduled Meds: . chlorhexidine gluconate (MEDLINE KIT)  15 mL Mouth Rinse BID  . Chlorhexidine Gluconate Cloth  6 each Topical Daily  . enoxaparin (LOVENOX) injection  40 mg Subcutaneous Q24H  . famotidine  20 mg Oral BID  . hydrochlorothiazide  12.5 mg Oral Daily  . insulin aspart  0-5 Units Subcutaneous QHS  . insulin aspart  0-9 Units Subcutaneous TID WC  . insulin glargine  10 Units Subcutaneous QHS  . labetalol  100 mg Oral BID  .  multivitamin with minerals  1 tablet Oral Daily  . nicotine  21 mg Transdermal Daily  . paliperidone  234 mg Intramuscular Once  . QUEtiapine  100 mg Oral QHS   Continuous Infusions: . sodium chloride Stopped (06/28/19 1431)   PRN Meds:.sodium chloride, clonazePAM, fentaNYL (SUBLIMAZE) injection, fentaNYL (SUBLIMAZE) injection, haloperidol lactate, hydrALAZINE, oxyCODONE-acetaminophen, polyethylene glycol, Resource ThickenUp Clear, senna-docusate  Antimicrobials: Anti-infectives (From admission, onward)   None       I have personally reviewed the following labs and images: CBC: Recent Labs  Lab 07/11/19 0545 07/12/19 0241  WBC 10.3 7.8  HGB 13.4 12.5  HCT 42.5 39.3  MCV 90.8 91.4  PLT 391 424*   BMP &GFR Recent Labs  Lab 07/11/19 0545 07/12/19 0241  NA 137 137  K 4.0 3.8  CL 98 98  CO2 29 31  GLUCOSE 244* 134*  BUN 16 13  CREATININE 0.74 0.72  CALCIUM 9.4 9.0  MG 2.0 1.9  PHOS 3.2 3.6  Estimated Creatinine Clearance: 101.3 mL/min (by C-G formula based on SCr of 0.72 mg/dL). Liver & Pancreas: Recent Labs  Lab 07/11/19 0545 07/12/19 0241  ALBUMIN 2.9* 2.8*   No results for input(s): LIPASE, AMYLASE in the last 168 hours. No results for input(s): AMMONIA in the last 168 hours. Diabetic: No results for input(s): HGBA1C in the last 72 hours. Recent Labs  Lab 07/11/19 0749 07/11/19 1123 07/11/19 1630 07/11/19 2146 07/12/19 0752  GLUCAP 206* 224* 200* 188* 131*   Cardiac Enzymes: No results for input(s): CKTOTAL, CKMB, CKMBINDEX, TROPONINI in the last 168 hours. No results for input(s): PROBNP in the last 8760 hours. Coagulation Profile: No results for input(s): INR, PROTIME in the last 168 hours. Thyroid Function Tests: Recent Labs    07/11/19 0545 07/12/19 0241  TSH 0.323*  --   FREET4  --  0.96   Lipid Profile: No results for input(s): CHOL, HDL, LDLCALC, TRIG, CHOLHDL, LDLDIRECT in the last 72 hours. Anemia Panel: No results for  input(s): VITAMINB12, FOLATE, FERRITIN, TIBC, IRON, RETICCTPCT in the last 72 hours. Urine analysis:    Component Value Date/Time   COLORURINE YELLOW 06/21/2019 0625   APPEARANCEUR CLEAR 06/21/2019 0625   LABSPEC 1.011 06/21/2019 0625   PHURINE 5.0 06/21/2019 0625   GLUCOSEU NEGATIVE 06/21/2019 0625   HGBUR SMALL (A) 06/21/2019 0625   BILIRUBINUR NEGATIVE 06/21/2019 0625   KETONESUR NEGATIVE 06/21/2019 0625   PROTEINUR NEGATIVE 06/21/2019 0625   UROBILINOGEN 0.2 12/08/2014 1937   NITRITE NEGATIVE 06/21/2019 0625   LEUKOCYTESUR SMALL (A) 06/21/2019 0625   Sepsis Labs: Invalid input(s): PROCALCITONIN, [place]  Microbiology: No results found for this or any previous visit (from the past 240 hour(s)).  Radiology Studies: No results found.   Habiba Treloar T. Pulcifer  If 7PM-7AM, please contact night-coverage [email or web address] Password [facility] 07/12/2019, 10:25 AM

## 2019-07-12 NOTE — NC FL2 (Signed)
[place] LEVEL OF CARE SCREENING TOOL     IDENTIFICATION  Patient Name: [name] Birthdate: Jul 12, 1960 Sex: female Admission Date (Current Location): 06/27/2019  [facility] and [place] Number:  Herbalist and Address:  The . [facility] [place] [address] [place], [place] [address]      Provider Number: [id number]  Attending Physician Name and Address:  Mercy Riding, MD  Relative Name and Phone Number:       Current Level of Care: Hospital Recommended Level of Care: [place] Prior Approval Number:    Date Approved/Denied:   PASRR Number: Pending, manual review  Discharge Plan: SNF    Current Diagnoses: Patient Active Problem List   Diagnosis Date Noted  . Acute respiratory failure ([place])   . Tracheostomy status ([place])   . Angioedema 06/28/2019  . Substance induced mood disorder ([place]) 12/27/2015  . Fever, unspecified 09/13/2015  . Leukocytosis 09/13/2015  . Acute encephalopathy 09/13/2015  . Substance abuse ([place]) 09/12/2015  . Hallucination   . Homicidal ideation   . Schizophrenia, unspecified type ([place])   . Schizoaffective disorder, bipolar type ([place])   . Schizoaffective disorder ([place]) 03/29/2013  . Cocaine abuse ([place]) 06/30/2011  . Non-compliant patient 06/29/2011  . Hypertension 06/29/2011  . Alcohol dependence ([place]) 06/28/2011  . Polysubstance dependence ([place]) 04/03/2011  . Psychiatric problem 04/03/2011  . Polysubstance abuse ([place]) 06/21/2010  . Schizophrenia ([place]) 06/21/2010    Orientation RESPIRATION BLADDER Height & Weight     Self, Time, Place  Tracheostomy, O2(56m [place] uncuffed; initially placed 4/16, changed on 4/29; 5L at 28%) Incontinent Weight: 251 lb 15.8 oz (114.3 kg) Height:     BEHAVIORAL SYMPTOMS/MOOD NEUROLOGICAL BOWEL NUTRITION STATUS      Incontinent Diet(see DC summary)  AMBULATORY STATUS COMMUNICATION OF NEEDS Skin   Limited Assist Verbally Normal                        Personal Care Assistance Level of Assistance  Bathing, Feeding, Dressing Bathing Assistance: Limited assistance Feeding assistance: Limited assistance Dressing Assistance: Limited assistance     Functional Limitations Info  Sight Sight Info: Impaired        SPECIAL CARE FACTORS FREQUENCY  PT (By licensed PT), OT (By licensed OT), Speech therapy     PT Frequency: 5x/wk OT Frequency: 5x/wk     Speech Therapy Frequency: 5x/wk      Contractures Contractures Info: Not present    Additional Factors Info  Code Status, Allergies, Psychotropic, Insulin Sliding Scale Code Status Info: Full Allergies Info: Lisinopril Psychotropic Info: Seroquel '100mg'$  daily at bed Insulin Sliding Scale Info: 0-9 units 3x/day with meals; 0-5 units daily at bed; Lantus 10 units daily at bed       Current Medications (07/12/2019):  This is the current hospital active medication list Current Facility-Administered Medications  Medication Dose Route Frequency Provider Last Rate Last Admin  . 0.9 %  sodium chloride infusion   Intravenous PRN ATally Due MD   Stopped at 06/28/19 1431  . chlorhexidine gluconate (MEDLINE KIT) (PERIDEX) 0.12 % solution 15 mL  15 mL Mouth Rinse BID ATally Due MD   15 mL at 07/12/19 1211  . Chlorhexidine Gluconate Cloth 2 % PADS 6 each  6 each Topical Daily ARigoberto Noel MD   6 each at 07/12/19 1211  . clonazePAM (KLONOPIN) tablet 0.5 mg  0.5 mg Oral TID PRN GMercy Riding MD   0.5  mg at 07/11/19 2030  . enoxaparin (LOVENOX) injection 40 mg  40 mg Subcutaneous Q24H Neva Seat, MD   40 mg at 07/12/19 1220  . famotidine (PEPCID) tablet 20 mg  20 mg Oral BID Wendee Beavers T, MD   20 mg at 07/12/19 9323  . fentaNYL (SUBLIMAZE) injection 25 mcg  25 mcg Intravenous Q15 min PRN Corey Harold, NP   25 mcg at 06/29/19 0110  . fentaNYL (SUBLIMAZE) injection 25-100 mcg  25-100 mcg Intravenous Q30 min PRN Corey Harold, NP   100 mcg at 07/02/19 1358  . haloperidol  lactate (HALDOL) injection 2 mg  2 mg Intravenous Q6H PRN Neva Seat, MD   2 mg at 07/09/19 0157  . hydrALAZINE (APRESOLINE) injection 5 mg  5 mg Intravenous Q4H PRN Neva Seat, MD      . hydrochlorothiazide (MICROZIDE) capsule 12.5 mg  12.5 mg Oral Daily Wendee Beavers T, MD   12.5 mg at 07/12/19 0823  . insulin aspart (novoLOG) injection 0-5 Units  0-5 Units Subcutaneous QHS Mercy Riding, MD   3 Units at 07/10/19 2132  . insulin aspart (novoLOG) injection 0-9 Units  0-9 Units Subcutaneous TID WC Mercy Riding, MD   2 Units at 07/12/19 1220  . insulin glargine (LANTUS) injection 10 Units  10 Units Subcutaneous QHS Lang Snow, FNP   10 Units at 07/11/19 2029  . labetalol (NORMODYNE) tablet 100 mg  100 mg Oral BID Wendee Beavers T, MD   100 mg at 07/12/19 5573  . multivitamin with minerals tablet 1 tablet  1 tablet Oral Daily Donne Hazel, MD   1 tablet at 07/12/19 [phone]  . nicotine (NICODERM CQ - dosed in mg/24 hours) patch 21 mg  21 mg Transdermal Daily Lang Snow, FNP   21 mg at 07/12/19 0818  . oxyCODONE-acetaminophen (PERCOCET/ROXICET) 5-325 MG per tablet 1 tablet  1 tablet Oral Q4H PRN Mercy Riding, MD   1 tablet at 07/11/19 2029  . paliperidone (INVEGA SUSTENNA) injection 234 mg  234 mg Intramuscular Once Donne Hazel, MD      . polyethylene glycol (MIRALAX / GLYCOLAX) packet 17 g  17 g Oral Daily PRN Mercy Riding, MD   17 g at 07/11/19 2028  . QUEtiapine (SEROQUEL) tablet 100 mg  100 mg Oral QHS Wendee Beavers T, MD   100 mg at 07/11/19 2030  . Resource ThickenUp Clear   Oral PRN Donne Hazel, MD      . senna-docusate (Senokot-S) tablet 1 tablet  1 tablet Oral BID PRN Mercy Riding, MD   1 tablet at 07/11/19 2029     Discharge Medications: Please see discharge summary for a list of discharge medications.  Relevant Imaging Results:  Relevant Lab Results:   Additional Information SS#: [id number]  Geralynn Ochs, LCSW

## 2019-07-12 NOTE — Plan of Care (Signed)
  Problem: Activity: Goal: Risk for activity intolerance will decrease Outcome: Progressing   Problem: Nutrition: Goal: Adequate nutrition will be maintained Outcome: Progressing   Problem: Elimination: Goal: Will not experience complications related to bowel motility Outcome: Progressing Goal: Will not experience complications related to urinary retention Outcome: Progressing   Problem: Coping: Goal: Level of anxiety will decrease Outcome: Progressing   Problem: Elimination: Goal: Will not experience complications related to bowel motility Outcome: Progressing Goal: Will not experience complications related to urinary retention Outcome: Progressing   Problem: Pain Managment: Goal: General experience of comfort will improve Outcome: Progressing   Problem: Safety: Goal: Ability to remain free from injury will improve Outcome: Progressing   Problem: Skin Integrity: Goal: Risk for impaired skin integrity will decrease Outcome: Progressing

## 2019-07-12 NOTE — Progress Notes (Signed)
Pulmonary Progress Note  Subjective/interval Making progress. Sp much improved.   Objective Blood Pressure (Abnormal) 133/98 (BP Location: Right Arm)   Pulse 100   Temperature 98 F (36.7 C) (Oral)   Respiration 19   Weight 114.3 kg   Last Menstrual Period 01/13/2012   Oxygen Saturation 98%   Body Mass Index 39.47 kg/m  General 59 year old black female. Resting in bed. No distress  HENT NCAT no JVD. Faint upper airway wheeze but phonation, vocal endurance and quality of voice 10-fold better c/w exam prior to down sizing Pulm clear  Card RRR abd not tender Ext warm and dry no edema Neuro awake. Confused but cooperative    Assessment   Acute hypoxemic respiratory failure due to angioedema s/p emergent trach- improving but lingering glottic edema has been precluding downsizing progress. Her voice quality has dramatically improved. There is a faint inspiratory wheeze but no air rush on PMV removal. I am hopefull we will be working  [employer] PMV as much as tolerated over weekend Will see her again Monday. Hope to start capping trials.   Will cont to follow   Simonne Martinet ACNP-BC [facility] Pulmonary/Critical Care Pager # [phone] OR # [phone] if no answer

## 2019-07-12 NOTE — Progress Notes (Signed)
Calorie Count Note: Day 2  48-hour calorie count ordered and started on 4/28 at dinner meal. Calorie count ended today after lunch meal.  Diet: Dysphagia 3 with nectar-thick liquids Supplements: - Vital Cuisine Shake TID, each supplement provides 520 kcal and 22 grams of protein  Day 2: 4/29 Dinner: 711 kcal, 32 grams protein 4/30 Breakfast: 426 kcal, 16 grams protein 4/30 Lunch: 544 kcal, 19 grams protein Supplements: 0 kcal, 0 grams protein  Total 24-hour intake: 1682 kcal (99% of minimum estimated needs)  67 grams protein (79% of minimum estimated needs)  Nutrition Diagnosis: Inadequate oral intakerelated to inability to eatas evidenced by NPO status.  Progressing  Goal: Patient will meet greater than or equal to 90% of their needs.  Progressing  Intervention: - Hormel Shakes TID - MVI with minerals daily - Encourage adequate PO intake - d/c 48-hour calorie count   Earma Reading, MS, RD, LDN Inpatient Clinical Dietitian Pager: [phone] Weekend/After Hours: [phone]

## 2019-07-13 DIAGNOSIS — J988 Other specified respiratory disorders: Secondary | ICD-10-CM

## 2019-07-13 DIAGNOSIS — Z930 Tracheostomy status: Secondary | ICD-10-CM | POA: Diagnosis not present

## 2019-07-13 DIAGNOSIS — J9601 Acute respiratory failure with hypoxia: Secondary | ICD-10-CM | POA: Diagnosis not present

## 2019-07-13 DIAGNOSIS — T783XXA Angioneurotic edema, initial encounter: Secondary | ICD-10-CM | POA: Diagnosis not present

## 2019-07-13 LAB — GLUCOSE, CAPILLARY
Glucose-Capillary: 157 mg/dL — ABNORMAL HIGH (ref 70–99)
Glucose-Capillary: 166 mg/dL — ABNORMAL HIGH (ref 70–99)
Glucose-Capillary: 151 mg/dL — ABNORMAL HIGH (ref 70–99)
Glucose-Capillary: 180 mg/dL — ABNORMAL HIGH (ref 70–99)

## 2019-07-13 NOTE — Progress Notes (Signed)
PROGRESS NOTE    [name]  UJW:[id number] DOB: [birth year] DOA: 06/27/2019 PCP: Medicine, [facility]   Chief Complaint  Patient presents with  . Allergic Reaction    Brief Narrative: 57OZ with hx bipolar 1 do, HTN, schizoaffective disorder, seizures who presented with angioedema, presumed from ACEI. Pt required emergent tracheostomy and admitted to ICU. Now on trach collar and transferred to [facility] service as of 4/22.     PCCM following for trach.  Trach downsized to #4 cuffless on 4/29.  Lingering glottic edema precluding downsizing progress.  PCCM planning to start capping trials next week.  Patient has been noncompliant with care refusing CBG monitoring some blood draws at times.  She is on dysphagia 3 diet.   Psych consulted for assistance with his psych medication and capacity assessment.  She was deemed to have no capacity due to poor insight into his situation.  Therapy recommended SNF.   Assessment & Plan:   Active Problems:   Angioedema   Acute respiratory failure (HCC)   Tracheostomy status (HCC)   Acute hypoxemic respiratory failure due to upper airway compromise from angioedema  -Angioedema thought to be due to ACE inhibitor's or cocaine.  UDS positive for cocaine and benzo. -Lisinopril added to allergy list.  -PCCM following-4/15-emergent trach>>> #4 cuffless on 4/29>> hope to start capping trials next week -Continued famotidine. -Therapy recommended CIR.  CIR recommended looking into SNF. -Diet advanced to dysphagia-3 on 4/28.  New diagnosis of DM-2 with hyperglycemia: A1c 7.8%.  Hyperglycemia improved after stopping steroid. CBG (last 3)  Recent Labs    07/12/19 1147 07/12/19 1610 07/13/19 0741  GLUCAP 186* 154* 157*   -Continue SSI-moderate and Lantus 5 units twice daily -Continue statin.   Essential hypertension: 149/103-Lisinopril discontinued due to angioedema. -Continue labetalol 100 mg twice daily and HCTZ 12.5 mg  daily.  Schizoaffective disorder/bipolar disorder-somewhat upset and irritable -Per report, [facility] July 2020 was taking: Cogentin 0.'5mg'$ , Quetiapine '100mg'$ , Depakote '500mg'$ , Topiramate '50mg'$ , and Invega sustenna (injection).Pharmacy reports she has only filled Haldol -Continue Quetiapine, as needed Haldol and Klonopin -On Invega q 30 days prior to admit -Appreciate psych input-deemed to lack capacity.  Her legal guardian seems to have changed her phone number.  Will involve CSW.  History of seizure: Depakote on her medication list back in 2017.  Not sure if she is still taking. -Not on AED since admission.  Noncompliance with care-refuses blood draw and meds at times.  Deemed to lack capacity by psych. -Encourage compliance.  Morbid obesity: Body mass index is 39.47 kg/m.  Has diabetes. -Encourage lifestyle change to lose weight.   DVT prophylaxis: Subcu Lovenox Code Status: Full code Family Communication: none Status is: Inpatient  Remains inpatient appropriate because:Unsafe d/c plan and clearance by consultants.   Dispo: The patient is from: Home  Anticipated d/c is to: SNF  Anticipated d/c date is: > 3 days  Patient currently is not medically stable to d/c.       Procedures: Emergent tracheostomy 06/27/2019  Antimicrobials:  CBG (last 3)  Recent Labs    07/12/19 1147 07/12/19 1610 07/13/19 0741  GLUCAP 186* 154* 157*     Subjective: Sitting up eating breakfast she tells me she is going home on Monday no events overnight noted by the staff  Objective: Vitals:   07/12/19 1900 07/13/19 0300 07/13/19 0741 07/13/19 0748  BP:   (!) 149/103   Pulse: 90  (!) 101 99  Resp: 18  19 20  Temp:   98.8 F (37.1 C)   TempSrc:      SpO2: 100% 100% 99% 98%  Weight:        Intake/Output Summary (Last 24 hours) at 07/13/2019 0935 Last data filed at 07/12/2019 1719 Gross per 24 hour  Intake --  Output 1 ml  Net -1  ml   Filed Weights   07/06/19 0408 07/07/19 0439 07/08/19 0423  Weight: 116.2 kg 115.4 kg 114.3 kg    Examination:  General exam: Appears calm and comfortable  Respiratory system: Scattered rhonchi bilaterally to auscultation. Respiratory effort normal. Cardiovascular system: S1 & S2 heard, RRR. No JVD, murmurs, rubs, gallops or clicks. No pedal edema. Gastrointestinal system: Abdomen is nondistended, soft and nontender. No organomegaly or masses felt. Normal bowel sounds heard. Central nervous system: Alert and oriented. No focal neurological deficits. Extremities: 1+ pitting edema Skin: No rashes, lesions or ulcers Psychiatry: Lacks judgment and insight    Data Reviewed: I have personally reviewed following labs and imaging studies  CBC: Recent Labs  Lab 07/11/19 0545 07/12/19 0241  WBC 10.3 7.8  HGB 13.4 12.5  HCT 42.5 39.3  MCV 90.8 91.4  PLT 391 424*    Basic Metabolic Panel: Recent Labs  Lab 07/11/19 0545 07/12/19 0241  NA 137 137  K 4.0 3.8  CL 98 98  CO2 29 31  GLUCOSE 244* 134*  BUN 16 13  CREATININE 0.74 0.72  CALCIUM 9.4 9.0  MG 2.0 1.9  PHOS 3.2 3.6    GFR: Estimated Creatinine Clearance: 101.3 mL/min (by C-G formula based on SCr of 0.72 mg/dL).  Liver Function Tests: Recent Labs  Lab 07/11/19 0545 07/12/19 0241  ALBUMIN 2.9* 2.8*    CBG: Recent Labs  Lab 07/11/19 2146 07/12/19 0752 07/12/19 1147 07/12/19 1610 07/13/19 0741  GLUCAP 188* 131* 186* 154* 157*     No results found for this or any previous visit (from the past 240 hour(s)).       Radiology Studies: No results found.      Scheduled Meds: . chlorhexidine gluconate (MEDLINE KIT)  15 mL Mouth Rinse BID  . Chlorhexidine Gluconate Cloth  6 each Topical Daily  . enoxaparin (LOVENOX) injection  40 mg Subcutaneous Q24H  . famotidine  20 mg Oral BID  . hydrochlorothiazide  12.5 mg Oral Daily  . insulin aspart  0-5 Units Subcutaneous QHS  . insulin aspart  0-9  Units Subcutaneous TID WC  . insulin glargine  10 Units Subcutaneous QHS  . labetalol  100 mg Oral BID  . multivitamin with minerals  1 tablet Oral Daily  . nicotine  21 mg Transdermal Daily  . paliperidone  234 mg Intramuscular Once  . QUEtiapine  100 mg Oral QHS   Continuous Infusions: . sodium chloride Stopped (06/28/19 1431)     LOS: 15 days     Georgette Shell, MD [facility]   To contact the attending provider between 7A-7P or the covering provider during after hours 7P-7A, please log into the web site [email or web address] and access using universal [place] password for that web site. If you do not have the password, please call the hospital operator.  07/13/2019, 9:35 AM

## 2019-07-13 NOTE — Progress Notes (Signed)
07/13/19  Seen in f/u for respiratory failure due to angioedema.  S: No events overnight, wants to know when she can go home.  O: No acute distress Placed cap on her trach and she immediately told me she could not breathe, became tachypneic Abd soft No LE edema   A: Acute hypoxemic respiratory failure due to angioedema s/p emergent trach  P: Better, she may need another laryngoscopy to evaluate anatomy after emergent trach if we cannot wean  Discussed with primary, can consider repeat capping trial Monday but if fails again she will probably need to go home with trach and OP ENT f/u  Will see again Monday, call if any issues in interim  Myrla Halsted MD PCCM

## 2019-07-14 LAB — GLUCOSE, CAPILLARY
Glucose-Capillary: 133 mg/dL — ABNORMAL HIGH (ref 70–99)
Glucose-Capillary: 136 mg/dL — ABNORMAL HIGH (ref 70–99)
Glucose-Capillary: 182 mg/dL — ABNORMAL HIGH (ref 70–99)
Glucose-Capillary: 169 mg/dL — ABNORMAL HIGH (ref 70–99)

## 2019-07-14 NOTE — Progress Notes (Signed)
PROGRESS NOTE    [name]  LFY:[id number] DOB: February 13, 1961 DOA: 06/27/2019 PCP: Medicine, [facility]   Chief Complaint  Patient presents with  . Allergic Reaction    Brief Narrative: 86ID with hx bipolar 1 do, HTN, schizoaffective disorder, seizures who presented with angioedema, presumed from ACEI. Pt required emergent tracheostomy and admitted to ICU. Now on trach collar and transferred to [facility] service as of 4/22.  PCCM following for trach. Trach downsized to #4 cuffless on 4/29. Lingering glottic edema precluding downsizing progress.PCCM planning to start capping trials next week.  Patient has been noncompliant with care refusing CBG monitoring some blood draws at times. She is on dysphagia 3 diet.   Psych consulted for assistance with his psych medication and capacity assessment. She was deemed to have no capacity due to poor insight into his situation.  Therapy recommended SNF.  Assessment & Plan:   Active Problems:   Angioedema   Acute respiratory failure (HCC)   Tracheostomy status (HCC)   Airway compromise   Acute hypoxemic respiratory failure due to upper airway compromise from angioedema  -Angioedema thought to be due to ACE inhibitor's or cocaine. UDS positive for cocaine and benzo. -Lisinopril added to allergy list.  -PCCM following-4/15-emergent trach>>>#4 cufflesson 4/29>>hope to start capping trials next week -Continued famotidine. -Therapy recommended CIR. CIR recommended looking into SNF. Patient wants to go home -Diet advanced to dysphagia-3 on 4/28.  New diagnosis of DM-2 with hyperglycemia: A1c 7.8%.Hyperglycemia improved after stopping steroid. CBG (last 3)       -Continue SSI-moderateand Lantus 5 units twice daily -Continue statin.   Essential hypertension: 132/94-Lisinopril discontinued due to angioedema. -Continue labetalol 100 mg twice daily and HCTZ 12.5 mg daily.  Schizoaffective disorder/bipolar  disorder-somewhat upset and irritable -Per report, [facility] D[facility] July 2020 was taking: Cogentin 0.'5mg'$ , Quetiapine '100mg'$ , Depakote '500mg'$ , Topiramate '50mg'$ , and Invega sustenna (injection).Pharmacy reports she has only filled Haldol -Continue Quetiapine, as needed Haldol and Klonopin -On Invega q 30 days prior to admit -Appreciate psych input-deemed to lack capacity.Her legal guardian seems to have changed her phone number.Will involve CSW.  History of seizure:Depakote on her medication list back in 2017. Not sure if she is still taking. -Not on AED since admission.  Noncompliance with care-refuses blood drawand medsat times.Deemed to lack capacity by psych. -Encourage compliance.   Morbid obesity:Body mass index is 39.47 kg/m.Has diabetes. -Encourage lifestyle change to lose weight DVT prophylaxis:Subcu Lovenox Code Status:Full code Family Communication:none Status is: Inpatient  Remains inpatient appropriate because:Unsafe d/c planand clearance by consultants.   Dispo: The patient is from: Home Anticipated d/c is to: SNF Anticipated d/c date is: > 3 days Patient currentlyis not medically stable to d/c.   Procedures: Emergent tracheostomy 06/27/2019   Antimicrobials:  Anti-infectives (From admission, onward)   None       Subjective: Wants to go home no new complaints  Objective: Vitals:   07/13/19 1639 07/14/19 0500 07/14/19 0711 07/14/19 0744  BP: (!) 162/97   (!) 132/94  Pulse: 83  86 77  Resp: '20  18 19  '$ Temp: 97.9 F (36.6 C)   98.5 F (36.9 C)  TempSrc:      SpO2: 96%  98% 98%  Weight:  109.2 kg      Intake/Output Summary (Last 24 hours) at 07/14/2019 0947 Last data filed at 07/13/2019 1700 Gross per 24 hour  Intake 118 ml  Output --  Net 118 ml   Filed Weights   07/07/19 0439 07/08/19  0423 07/14/19 0500  Weight: 115.4 kg 114.3 kg 109.2 kg    Examination:  General exam: Appears calm  and comfortable  Respiratory system: scattered rhonchi  to auscultation. Respiratory effort normal.trach in place  Cardiovascular system: S1 & S2 heard, RRR. No JVD, murmurs, rubs, gallops or clicks. No pedal edema. Gastrointestinal system: Abdomen is nondistended, soft and nontender. No organomegaly or masses felt. Normal bowel sounds heard. Central nervous system: Alert and oriented. No focal neurological deficits. Extremities: Symmetric 5 x 5 power. Skin: No rashes, lesions or ulcers Psychiatry: impaired judgement     Data Reviewed: I have personally reviewed following labs and imaging studies  CBC: Recent Labs  Lab 07/11/19 0545 07/12/19 0241  WBC 10.3 7.8  HGB 13.4 12.5  HCT 42.5 39.3  MCV 90.8 91.4  PLT 391 424*    Basic Metabolic Panel: Recent Labs  Lab 07/11/19 0545 07/12/19 0241  NA 137 137  K 4.0 3.8  CL 98 98  CO2 29 31  GLUCOSE 244* 134*  BUN 16 13  CREATININE 0.74 0.72  CALCIUM 9.4 9.0  MG 2.0 1.9  PHOS 3.2 3.6    GFR: Estimated Creatinine Clearance: 98.7 mL/min (by C-G formula based on SCr of 0.72 mg/dL).  Liver Function Tests: Recent Labs  Lab 07/11/19 0545 07/12/19 0241  ALBUMIN 2.9* 2.8*    CBG: Recent Labs  Lab 07/13/19 0741 07/13/19 1226 07/13/19 1638 07/13/19 2105 07/14/19 0747  GLUCAP 157* 166* 151* 180* 133*     No results found for this or any previous visit (from the past 240 hour(s)).       Radiology Studies: No results found.      Scheduled Meds: . chlorhexidine gluconate (MEDLINE KIT)  15 mL Mouth Rinse BID  . Chlorhexidine Gluconate Cloth  6 each Topical Daily  . enoxaparin (LOVENOX) injection  40 mg Subcutaneous Q24H  . famotidine  20 mg Oral BID  . hydrochlorothiazide  12.5 mg Oral Daily  . insulin aspart  0-5 Units Subcutaneous QHS  . insulin aspart  0-9 Units Subcutaneous TID WC  . insulin glargine  10 Units Subcutaneous QHS  . labetalol  100 mg Oral BID  . multivitamin with minerals  1 tablet Oral  Daily  . nicotine  21 mg Transdermal Daily  . paliperidone  234 mg Intramuscular Once  . QUEtiapine  100 mg Oral QHS   Continuous Infusions: . sodium chloride Stopped (06/28/19 1431)     LOS: 16 days     Georgette Shell, MD [facility]   To contact the attending provider between 7A-7P or the covering provider during after hours 7P-7A, please log into the web site [email or web address] and access using universal [place] password for that web site. If you do not have the password, please call the hospital operator.  07/14/2019, 9:47 AM

## 2019-07-15 ENCOUNTER — Ambulatory Visit: Payer: Medicare Other | Admitting: Podiatrist

## 2019-07-15 DIAGNOSIS — T783XXS Angioneurotic edema, sequela: Secondary | ICD-10-CM | POA: Diagnosis not present

## 2019-07-15 DIAGNOSIS — Z930 Tracheostomy status: Secondary | ICD-10-CM | POA: Diagnosis not present

## 2019-07-15 LAB — GLUCOSE, CAPILLARY
Glucose-Capillary: 154 mg/dL — ABNORMAL HIGH (ref 70–99)
Glucose-Capillary: 142 mg/dL — ABNORMAL HIGH (ref 70–99)
Glucose-Capillary: 183 mg/dL — ABNORMAL HIGH (ref 70–99)
Glucose-Capillary: 169 mg/dL — ABNORMAL HIGH (ref 70–99)
Glucose-Capillary: 208 mg/dL — ABNORMAL HIGH (ref 70–99)

## 2019-07-15 MED ORDER — PHENOL 1.4 % MT LIQD
1.0000 | OROMUCOSAL | Status: DC | PRN
  Administered 2019-07-15: 1 via OROMUCOSAL
  Filled 2019-07-15: qty 177

## 2019-07-15 NOTE — Progress Notes (Signed)
PROGRESS NOTE  [name] EXB:[id number] DOB: 01/12/1961   PCP: Medicine, [facility]  Patient is from: Home  DOA: 06/27/2019 LOS: 47  Brief Narrative / Interim history: 59yo with hx bipolar 1 do, HTN, schizoaffective disorder, seizures who presented with angioedema, presumed from ACEI. Pt required emergent tracheostomy and admitted to ICU. Now on trach collar and transferred to [facility] service as of 4/22.     PCCM following for trach.  Trach downsized to #4 cuffless on 4/29.  Lingering glottic edema precluding downsizing progress.  PCCM planning to start capping trials next week.  Patient has been noncompliant with care refusing CBG monitoring some blood draws at times.  She is on dysphagia 3 diet.   Psych consulted for assistance with his psych medication and capacity assessment.  She was deemed to have 58 capacity due to poor insight into his situation.  Therapy recommended SNF.   Subjective: Seen and examined earlier this morning.  She says she would like to go home as usual.  No major events overnight of this morning.  No complaints.  Denies chest pain, dyspnea, GI or UTI symptoms.  Objective: Vitals:   07/15/19 0404 07/15/19 0500 07/15/19 0744 07/15/19 1046  BP: (!) 141/87  (!) 153/99   Pulse: 86  89   Resp: '19  18 20  '$ Temp: 98.2 F (36.8 C)  98.4 F (36.9 C)   TempSrc: Oral  Oral   SpO2: 99% 100% 99% 99%  Weight:       No intake or output data in the 24 hours ending 07/15/19 1052 Filed Weights   07/07/19 0439 07/08/19 0423 07/14/19 0500  Weight: 115.4 kg 114.3 kg 109.2 kg    Examination:  GENERAL: No apparent distress.  Nontoxic. HEENT: MMM.  Vision and hearing grossly intact.  NECK: Supple.  No apparent JVD.  RESP: 100% on 5 L / 28%.  No IWOB.  Fair aeration bilaterally. CVS:  RRR. Heart sounds normal.  ABD/GI/GU: Bowel sounds present. Soft. Non tender.  MSK/EXT:  Moves extremities. No apparent deformity. No edema.  SKIN: no apparent skin  lesion or wound NEURO: Awake, alert and oriented appropriately.  No apparent focal neuro deficit. PSYCH: Calm. Normal affect.  Poor insight.  Procedures:  4/15-emergent tracheostomy  Microbiology summarized: COVID-19 PCR negative. Influenza PCR negative. MRSA PCR negative.  Assessment & Plan: Acute hypoxemic respiratory failure due to upper airway compromise from angioedema  -Angioedema thought to be due to ACE inhibitor's or cocaine.  UDS positive for cocaine and benzo. -Lisinopril added to allergy list.  -PCCM following-4/15-emergent trach>>> #4 cuffless on 4/29>> plan for capping trial today. -Continued famotidine. -Therapy recommended CIR.  CIR recommended looking into SNF.  She wants to go home but has poor insight and deemed to lack capacity. -Diet advanced to dysphagia-3 on 4/28.  New diagnosis of DM-2 with hyperglycemia: A1c 7.8%.  Hyperglycemia improved after stopping steroid. Recent Labs    07/14/19 2027 07/15/19 0634 07/15/19 0753  GLUCAP 169* 154* 142*  -Continue SSI-moderate and Lantus 5 units twice daily -Continue statin.   Essential hypertension: Normotensive. -Lisinopril discontinued due to angioedema. -Continue labetalol 100 mg twice daily and HCTZ 12.5 mg daily.  Schizoaffective disorder/bipolar disorder-somewhat upset and irritable -Per report, [facility] July 2020 was taking: Cogentin 0.'5mg'$ , Quetiapine '100mg'$ , Depakote '500mg'$ , Topiramate '50mg'$ , and Invega sustenna (injection).Pharmacy reports she has only filled Haldol -Continue Quetiapine, as needed Haldol and Klonopin -On Invega q 30 days prior to admit -Appreciate psych input-deemed to lack  capacity.  Her legal guardian seems to have changed her phone number.   History of seizure: Depakote on her medication list back in 2017.  Not sure if she is still taking. -Not on AED since admission.  Noncompliance with care-refuses blood draw and meds at times.  Deemed to lack capacity by psych. -Encourage  compliance.  Morbid obesity: Body mass index is 37.71 kg/m.  Has diabetes. -Encourage lifestyle change to lose weight.   Nutrition Problem: Inadequate oral intake Etiology: inability to eat  Signs/Symptoms: NPO status  Interventions: MVI, Magic cup, Hormel Shake       DVT prophylaxis: Subcu Lovenox Code Status: Full code Family Communication: Attempted to call Elmyra Ricks (legal guardian) for update but the phone number listed in the chart is no longer hurts.  Attempted to call his son and daughter but no answer.  Status is: Inpatient  Remains inpatient appropriate because:Unsafe d/c plan and clearance by consultants.   Dispo: The patient is from: Home              Anticipated d/c is to: SNF              Anticipated d/c date is: > 3 days              Patient currently is not medically stable to d/c.        Consultants:  PCCM Psychiatry   Sch Meds:  Scheduled Meds: . chlorhexidine gluconate (MEDLINE KIT)  15 mL Mouth Rinse BID  . Chlorhexidine Gluconate Cloth  6 each Topical Daily  . enoxaparin (LOVENOX) injection  40 mg Subcutaneous Q24H  . famotidine  20 mg Oral BID  . hydrochlorothiazide  12.5 mg Oral Daily  . insulin aspart  0-5 Units Subcutaneous QHS  . insulin aspart  0-9 Units Subcutaneous TID WC  . insulin glargine  10 Units Subcutaneous QHS  . labetalol  100 mg Oral BID  . multivitamin with minerals  1 tablet Oral Daily  . nicotine  21 mg Transdermal Daily  . paliperidone  234 mg Intramuscular Once  . QUEtiapine  100 mg Oral QHS   Continuous Infusions: . sodium chloride Stopped (06/28/19 1431)   PRN Meds:.sodium chloride, clonazePAM, fentaNYL (SUBLIMAZE) injection, fentaNYL (SUBLIMAZE) injection, haloperidol lactate, hydrALAZINE, oxyCODONE-acetaminophen, polyethylene glycol, Resource ThickenUp Clear, senna-docusate  Antimicrobials: Anti-infectives (From admission, onward)   None       I have personally reviewed the following labs and  images: CBC: Recent Labs  Lab 07/11/19 0545 07/12/19 0241  WBC 10.3 7.8  HGB 13.4 12.5  HCT 42.5 39.3  MCV 90.8 91.4  PLT 391 424*   BMP &GFR Recent Labs  Lab 07/11/19 0545 07/12/19 0241  NA 137 137  K 4.0 3.8  CL 98 98  CO2 29 31  GLUCOSE 244* 134*  BUN 16 13  CREATININE 0.74 0.72  CALCIUM 9.4 9.0  MG 2.0 1.9  PHOS 3.2 3.6   Estimated Creatinine Clearance: 98.7 mL/min (by C-G formula based on SCr of 0.72 mg/dL). Liver & Pancreas: Recent Labs  Lab 07/11/19 0545 07/12/19 0241  ALBUMIN 2.9* 2.8*   No results for input(s): LIPASE, AMYLASE in the last 168 hours. No results for input(s): AMMONIA in the last 168 hours. Diabetic: No results for input(s): HGBA1C in the last 72 hours. Recent Labs  Lab 07/14/19 1241 07/14/19 1639 07/14/19 2027 07/15/19 0634 07/15/19 0753  GLUCAP 136* 182* 169* 154* 142*   Cardiac Enzymes: No results for input(s): CKTOTAL, CKMB, CKMBINDEX, TROPONINI in the  last 168 hours. No results for input(s): PROBNP in the last 8760 hours. Coagulation Profile: No results for input(s): INR, PROTIME in the last 168 hours. Thyroid Function Tests: No results for input(s): TSH, T4TOTAL, FREET4, T3FREE, THYROIDAB in the last 72 hours. Lipid Profile: No results for input(s): CHOL, HDL, LDLCALC, TRIG, CHOLHDL, LDLDIRECT in the last 72 hours. Anemia Panel: No results for input(s): VITAMINB12, FOLATE, FERRITIN, TIBC, IRON, RETICCTPCT in the last 72 hours. Urine analysis:    Component Value Date/Time   COLORURINE YELLOW 06/21/2019 0625   APPEARANCEUR CLEAR 06/21/2019 0625   LABSPEC 1.011 06/21/2019 0625   PHURINE 5.0 06/21/2019 0625   GLUCOSEU NEGATIVE 06/21/2019 0625   HGBUR SMALL (A) 06/21/2019 0625   BILIRUBINUR NEGATIVE 06/21/2019 0625   KETONESUR NEGATIVE 06/21/2019 0625   PROTEINUR NEGATIVE 06/21/2019 0625   UROBILINOGEN 0.2 12/08/2014 1937   NITRITE NEGATIVE 06/21/2019 0625   LEUKOCYTESUR SMALL (A) 06/21/2019 0625   Sepsis  Labs: Invalid input(s): PROCALCITONIN, [place]  Microbiology: No results found for this or any previous visit (from the past 240 hour(s)).  Radiology Studies: No results found.   Eydie Wormley T. [place]  If 7PM-7AM, please contact night-coverage [email or web address] Password [facility] 07/15/2019, 10:52 AM

## 2019-07-15 NOTE — Progress Notes (Signed)
Pulmonary Progress Note  Subjective/interval Very focused on going home.  Deemed by psychiatry 4/30 to not have decision making capacity  No complaints, doing well on PMV, ATC 28%  Objective BP (!) 153/99 (BP Location: Right Arm)   Pulse 89   Temp 98.4 F (36.9 C) (Oral)   Resp 20   Wt 109.2 kg   LMP 01/13/2012   SpO2 99%   BMI 37.71 kg/m   General:  Older AAF sitting in bed in NAD HEENT: MM pink/moist, midline 4 cuffless trach with PMV, good phonation Neuro: Awake, oriented to person and place.  States her birthday is coming up and wants to be at home, MAE CV: rr, no murmur PULM:  Non labored, scattered rhonchi, good cough, scant secretions GI: soft, +bs Extremities: warm/dry, no edema   At bedside on continuous pulse ox, trach capped.  Tolerating well.  No respiratory distress or desaturation.    Assessment   Acute hypoxemic respiratory failure due to angioedema s/p emergent trach- Had lingering glottic edema which precluded her downsizing progress.  Able to down size to 4 cuffless 4/29.   - did not tolerate capping trial 5/1 Plan Continue capping trials while monitoring continuous pulse ox, close monitoring  Ongoing trach care SLP following, remains on dysphagia 3 diet  PCCM will see again 5/4.      Posey Boyer, MSN, AGACNP-BC [facility] 07/15/2019, 2:15 PM

## 2019-07-16 ENCOUNTER — Inpatient Hospital Stay (HOSPITAL_COMMUNITY): Payer: Medicare Other

## 2019-07-16 DIAGNOSIS — Z930 Tracheostomy status: Secondary | ICD-10-CM | POA: Diagnosis not present

## 2019-07-16 DIAGNOSIS — T783XXS Angioneurotic edema, sequela: Secondary | ICD-10-CM | POA: Diagnosis not present

## 2019-07-16 LAB — GLUCOSE, CAPILLARY
Glucose-Capillary: 152 mg/dL — ABNORMAL HIGH (ref 70–99)
Glucose-Capillary: 134 mg/dL — ABNORMAL HIGH (ref 70–99)
Glucose-Capillary: 131 mg/dL — ABNORMAL HIGH (ref 70–99)
Glucose-Capillary: 131 mg/dL — ABNORMAL HIGH (ref 70–99)
Glucose-Capillary: 234 mg/dL — ABNORMAL HIGH (ref 70–99)

## 2019-07-16 MED ORDER — AMLODIPINE BESYLATE 5 MG PO TABS
5.0000 mg | ORAL_TABLET | Freq: Every day | ORAL | Status: DC
  Administered 2019-07-16 – 2019-07-19 (×4): 5 mg via ORAL
  Filled 2019-07-16 (×4): qty 1

## 2019-07-16 NOTE — Progress Notes (Signed)
Speech Language Pathology Treatment: Dysphagia  Patient Details Name: [name] MRN: [id number] DOB: 1960/04/06 Today's Date: 07/16/2019 Time: 1100-1115 SLP Time Calculation (min) (ACUTE ONLY): 15 min  Assessment / Plan / Recommendation Clinical Impression  Ms. Nease was scheduled for an objective swallow study in hopes of upgrading to thin liquids. Unfortunately, pt refused this service today, but was agreeable to a bedside assessment of liquid tolerance. Pt was seen upright in bed with thin liquids via cup/straw. Pt was unable to fully complete 3 oz of thin consecutively, stating "I'm not thirsty." She did, however, efficiently consume thin liquids in smaller amounts, still in sequential sips, without any s/s aspiration. Based on prior swallow study showing no aspiration and transient penetration, as well as performance during this session, recommend pt upgrade to thin liquids and solids as tolerated (dysphagia level 3). RN was present at end of evaluation. SLP educated RN/pt on recommendations. They demonstrated understanding and agreement. No further ST indicated at this time.   HPI HPI: 58 year old woman who presented with angioedema and airway compromise secondary to ACE inhibitor use. The decision was made to intubate the patient for airway protection and anesthesia was called for difficult airway. Unfortunately the anesthesiologist was unable to intubate the patient and she was taken emergently to OR for tracheostomy. PMH is significant for HTN, schizoaffective disorder, bipolar disorder, seizures, and (cocaine) substance abuse.       SLP Plan  All goals met        Recommendations         Follow up Recommendations: None SLP Visit Diagnosis: Dysphagia, unspecified (R13.10) Plan: All goals met                     Wheeler Incorvaia P. Kambree Krauss, M.S., CCC-SLP Speech-Language Pathologist [facility] Rehabilitation Services Pager: [place] 07/16/2019, 11:41  AM

## 2019-07-16 NOTE — Plan of Care (Signed)
2W06 [place] is non-compliant and verbally threaten to throw continuous oxygen monitor at nurse. Pt ambulates to bathroom without assistance and has tolerated capped trach despite complains earlier.

## 2019-07-16 NOTE — Progress Notes (Signed)
2W06 [name]. Pt was complaining of difficulty breathing and tight trach tie. [place] RRT assessed pt and loosened trach tie and the pt SPO2 96% RA. After staying with patient and explaining the purpose of capped trach, she understood and went to sleep. Night coverage Dr Marga Melnick MD was informed via Amion. Also, upon assessment 2 cans of coke bottle was found in room but pt diet order is nectar thick. Will recommend SLP consult to evaluate pt for diet change to prevent potential aspiration.

## 2019-07-16 NOTE — Progress Notes (Signed)
Occupational Therapy Treatment Patient Details Name: [name] MRN: [id number] DOB: 09-22-1960 Today's Date: 07/16/2019    History of present illness Pt is a 59 year old woman who presented with angioedema and airway compromise secondary to ACE inhibitor use. The decision was made to intubate the patient for airway protection and anesthesia was called for difficult airway. Unfortunately the anesthesiologist was unable to intubate the patient and she was taken emergently to OR for tracheostomy. PMH is significant for HTN, schizoaffective disorder, bipolar disorder, seizures, and (cocaine) substance abuse.     OT comments  Pt insisting on d/c home alone for mothers day weekend. Pt lacks insight to deficits and does not recognize trach is capped this session. Pt demonstrates poor awareness to the basic use of the trach at this time so concern for adl care regarding the site will requires RN (A). Pt abandons RW throughout session showing poor safety awareness.   Follow Up Recommendations  Supervision/Assistance - 24 hour;SNF    Equipment Recommendations  3 in 1 bedside commode    Recommendations for Other Services      Precautions / Restrictions Precautions Precautions: Fall Precaution Comments: Pt extremely impulsive. Poor safety awareness.        Mobility Bed Mobility               General bed mobility comments: oob in chair  Transfers Overall transfer level: Needs assistance Equipment used: Rolling walker (2 wheeled) Transfers: Sit to/from Stand   Stand pivot transfers: Min guard       General transfer comment: pt fatigues quickly and requesting to return to room    Balance Overall balance assessment: Needs assistance Sitting-balance support: Bilateral upper extremity supported;Feet supported Sitting balance-Leahy Scale: Good     Standing balance support: Bilateral upper extremity supported;During functional activity Standing balance-Leahy Scale: Fair Standing  balance comment: using 1 UE for support on sink surface                           ADL either performed or assessed with clinical judgement   ADL Overall ADL's : Needs assistance/impaired Eating/Feeding: Supervision/ safety Eating/Feeding Details (indicate cue type and reason): pt accuractely recalls diet changes and SLP notified for need to have diet order upgraded to match notes. pt provided thin liquids with diet update Grooming: Wash/dry hands;Min guard Grooming Details (indicate cue type and reason): abandons RW and push out of the way                 Toilet Transfer: Magazine features editor Details (indicate cue type and reason): abandons RW and pushes out of the way i dont need that [place] and Hygiene: Min guard;Sit to/from stand       Functional mobility during ADLs: Min guard;Rolling walker General ADL Comments: pt walking toward RW that she pushed away and states i have to have my walker to walk. pt insist on exiting room.pt recalls room number from memory but almost enter room 4 instead of 6. pt needed visual input from patient in room bed already to correct error     Vision       Perception     Praxis      Cognition Arousal/Alertness: Awake/alert Behavior During Therapy: Impulsive Overall Cognitive Status: No family/caregiver present to determine baseline cognitive functioning  General Comments: pt states "i been home before from hospital. i can go home this time. I am going home for my birthday. I party like i am 17 " Pt lacks awareness to safety and abandons RW. pt insist that she must have RW at other times during session. pt reports no family or friends upon d/c that she has neighbors that live next door.         Exercises     Shoulder Instructions       General Comments capped trach this session. pt asking if OT can suction her. Pt unaware of cap on trach     Pertinent Vitals/ Pain       Pain Assessment: No/denies pain  Home Living                                          Prior Functioning/Environment              Frequency  Min 2X/week        Progress Toward Goals  OT Goals(current goals can now be found in the care plan section)  Progress towards OT goals: Progressing toward goals  Acute Rehab OT Goals Patient Stated Goal: to go home for weekend mothers day OT Goal Formulation: With patient Time For Goal Achievement: 07/30/19 Potential to Achieve Goals: Good ADL Goals Pt Will Perform Grooming: with modified independence;standing Pt Will Perform Lower Body Bathing: sit to/from stand;with supervision Pt Will Perform Upper Body Dressing: sitting;with modified independence Pt Will Perform Lower Body Dressing: with set-up;with supervision;sit to/from stand Pt Will Transfer to Toilet: ambulating;with modified independence Pt Will Perform Toileting - Clothing Manipulation and hygiene: sit to/from stand;with modified independence Additional ADL Goal #3: Pt will participate in further visual assessment.  Plan Discharge plan remains appropriate    Co-evaluation                 AM-PAC OT "6 Clicks" Daily Activity     Outcome Measure   Help from another person eating meals?: A Little Help from another person taking care of personal grooming?: A Little Help from another person toileting, which includes using toliet, bedpan, or urinal?: A Little Help from another person bathing (including washing, rinsing, drying)?: A Little Help from another person to put on and taking off regular upper body clothing?: A Little Help from another person to put on and taking off regular lower body clothing?: A Lot 6 Click Score: 17    End of Session Equipment Utilized During Treatment: Rolling walker  OT Visit Diagnosis: Muscle weakness (generalized) (M62.81);Unsteadiness on feet (R26.81);Other symptoms and signs  involving cognitive function   Activity Tolerance Patient tolerated treatment well   Patient Left in chair;with call bell/phone within reach;with chair alarm set   Nurse Communication Mobility status        Time: 1420-1436 OT Time Calculation (min): 16 min  Charges: OT General Charges $OT Visit: 1 Visit OT Treatments $Self Care/Home Management : 8-22 mins   Brynn, OTR/L  Acute Rehabilitation Services Pager: [phone] Office: [phone] .    Mateo Flow 07/16/2019, 3:29 PM

## 2019-07-16 NOTE — Progress Notes (Signed)
Pulmonary Progress Note  Subjective/interval Tolerating capped trach for 24 hours.  Objective BP (!) 179/105 (BP Location: Right Arm)   Pulse 92   Temp 97.9 F (36.6 C)   Resp (!) 22   Wt 109.2 kg   LMP 01/13/2012   SpO2 97%   BMI 37.71 kg/m   General: Morbidly obese female no acute distress HEENT: #4 Cuffless Trach in Place.  Noted to be capped. Neuro: Grossly intact CV: Heart sounds are regular PULM: Adequate air movement.  Noted to have some stridor around tracheostomy. Currently on room air with adequate sats Abd: soft, bsx4 active  Extremities: warm/dry,  edema  Skin: no rashes or lesions   Assessment   Acute hypoxemic respiratory failure due to angioedema s/p emergent trach- Had lingering glottic edema which precluded her downsizing progress.  Able to down size to 4 cuffless 4/29.   - did not tolerate capping trial 5/1 Plan Currently tolerating trach capping x24 hours. If she goes 48 hours then will consider decannulation at that time. We will see again 07/17/2019 and assess for decannulation.   Brett Canales Moataz Tavis ACNP Acute Care Nurse Practitioner [facility] Pul[facility]/Critical Care Please consult Amion 07/16/2019, 10:06 AM

## 2019-07-16 NOTE — Progress Notes (Signed)
PT Cancellation Note  Patient Details Name: [name] MRN: [id number] DOB: 12-06-1960   Cancelled Treatment:    Reason Eval/Treat Not Completed: Patient declined, no reason specified; reports just walked with OT and now watching TV.  Will attempt again another day.   Elray Mcgregor 07/16/2019, 3:44 PM  Sheran Lawless, PT Acute Rehabilitation Services [phone] 07/16/2019

## 2019-07-16 NOTE — NC FL2 (Signed)
[place] LEVEL OF CARE SCREENING TOOL     IDENTIFICATION  Patient Name: [name] Birthdate: January 10, 1961 Sex: female Admission Date (Current Location): 06/27/2019  [facility] and [place] Number:  Herbalist and Address:  [facility], [place] [address], [place], [place] [address]      Provider Number: [id number]  Attending Physician Name and Address:  Mercy Riding, MD  Relative Name and Phone Number:       Current Level of Care: Hospital Recommended Level of Care: [place] Prior Approval Number:    Date Approved/Denied:   PASRR Number: Pending, manual review  Discharge Plan: SNF    Current Diagnoses: Patient Active Problem List   Diagnosis Date Noted  . Airway compromise   . Acute respiratory failure ([place])   . Tracheostomy status ([place])   . Angioedema 06/28/2019  . Substance induced mood disorder ([place]) 12/27/2015  . Fever, unspecified 09/13/2015  . Leukocytosis 09/13/2015  . Acute encephalopathy 09/13/2015  . Substance abuse ([place]) 09/12/2015  . Hallucination   . Homicidal ideation   . Schizophrenia, unspecified type ([place])   . Schizoaffective disorder, bipolar type ([place])   . Schizoaffective disorder ([place]) 03/29/2013  . Cocaine abuse ([place]) 06/30/2011  . Non-compliant patient 06/29/2011  . Hypertension 06/29/2011  . Alcohol dependence ([place]) 06/28/2011  . Polysubstance dependence ([place]) 04/03/2011  . Psychiatric problem 04/03/2011  . Polysubstance abuse ([place]) 06/21/2010  . Schizophrenia ([place]) 06/21/2010    Orientation RESPIRATION BLADDER Height & Weight     Self, Time, Place  (39m Shiley uncuffed; initially placed 4/16, changed on 4/29; 5L at 28%) Incontinent Weight: 240 lb 11.9 oz (109.2 kg) Height:     BEHAVIORAL SYMPTOMS/MOOD NEUROLOGICAL BOWEL NUTRITION STATUS      Incontinent Diet(see DC summary)  AMBULATORY STATUS COMMUNICATION OF NEEDS Skin   Limited Assist Verbally Normal                        Personal Care Assistance Level of Assistance  Bathing, Feeding, Dressing Bathing Assistance: Limited assistance Feeding assistance: Limited assistance Dressing Assistance: Limited assistance     Functional Limitations Info  Sight Sight Info: Impaired        SPECIAL CARE FACTORS FREQUENCY  PT (By licensed PT), OT (By licensed OT), Speech therapy     PT Frequency: 5x/wk OT Frequency: 5x/wk     Speech Therapy Frequency: 5x/wk      Contractures Contractures Info: Not present    Additional Factors Info  Code Status, Allergies, Psychotropic, Insulin Sliding Scale Code Status Info: Full Allergies Info: Lisinopril Psychotropic Info: Seroquel '100mg'$  daily at bed Insulin Sliding Scale Info: 0-9 units 3x/day with meals; 0-5 units daily at bed; Lantus 10 units daily at bed       Current Medications (07/16/2019):  This is the current hospital active medication list Current Facility-Administered Medications  Medication Dose Route Frequency Provider Last Rate Last Admin  . 0.9 %  sodium chloride infusion   Intravenous PRN ATally Due MD   Stopped at 06/28/19 1431  . amLODipine (NORVASC) tablet 5 mg  5 mg Oral Daily GWendee BeaversT, MD   5 mg at 07/16/19 1034  . chlorhexidine gluconate (MEDLINE KIT) (PERIDEX) 0.12 % solution 15 mL  15 mL Mouth Rinse BID ATally Due MD   15 mL at 07/16/19 0647  . Chlorhexidine Gluconate Cloth 2 % PADS 6 each  6 each Topical Daily ARigoberto Noel MD  6 each at 07/16/19 1031  . clonazePAM (KLONOPIN) tablet 0.5 mg  0.5 mg Oral TID PRN Wendee Beavers T, MD   0.5 mg at 07/13/19 2058  . enoxaparin (LOVENOX) injection 40 mg  40 mg Subcutaneous Q24H Neva Seat, MD   40 mg at 07/16/19 1030  . famotidine (PEPCID) tablet 20 mg  20 mg Oral BID Wendee Beavers T, MD   20 mg at 07/16/19 1031  . fentaNYL (SUBLIMAZE) injection 25 mcg  25 mcg Intravenous Q15 min PRN Corey Harold, NP   25 mcg at 06/29/19 0110  . fentaNYL (SUBLIMAZE) injection 25-100 mcg   25-100 mcg Intravenous Q30 min PRN Corey Harold, NP   100 mcg at 07/02/19 1358  . haloperidol lactate (HALDOL) injection 2 mg  2 mg Intravenous Q6H PRN Neva Seat, MD   2 mg at 07/09/19 0157  . hydrALAZINE (APRESOLINE) injection 5 mg  5 mg Intravenous Q4H PRN Neva Seat, MD      . hydrochlorothiazide (MICROZIDE) capsule 12.5 mg  12.5 mg Oral Daily Wendee Beavers T, MD   12.5 mg at 07/16/19 1031  . insulin aspart (novoLOG) injection 0-5 Units  0-5 Units Subcutaneous QHS Mercy Riding, MD   2 Units at 07/15/19 2052  . insulin aspart (novoLOG) injection 0-9 Units  0-9 Units Subcutaneous TID WC Mercy Riding, MD   2 Units at 07/16/19 0645  . insulin glargine (LANTUS) injection 10 Units  10 Units Subcutaneous QHS Lang Snow, FNP   10 Units at 07/15/19 2053  . labetalol (NORMODYNE) tablet 100 mg  100 mg Oral BID Wendee Beavers T, MD   100 mg at 07/16/19 1030  . multivitamin with minerals tablet 1 tablet  1 tablet Oral Daily Donne Hazel, MD   1 tablet at 07/16/19 1030  . nicotine (NICODERM CQ - dosed in mg/24 hours) patch 21 mg  21 mg Transdermal Daily Lang Snow, FNP   21 mg at 07/16/19 1031  . oxyCODONE-acetaminophen (PERCOCET/ROXICET) 5-325 MG per tablet 1 tablet  1 tablet Oral Q4H PRN Mercy Riding, MD   1 tablet at 07/15/19 1213  . paliperidone (INVEGA SUSTENNA) injection 234 mg  234 mg Intramuscular Once Donne Hazel, MD      . phenol [facility]) mouth spray 1 spray  1 spray Mouth/Throat PRN Opyd, Ilene Qua, MD   1 spray at 07/15/19 2311  . polyethylene glycol (MIRALAX / GLYCOLAX) packet 17 g  17 g Oral Daily PRN Mercy Riding, MD   17 g at 07/11/19 2028  . QUEtiapine (SEROQUEL) tablet 100 mg  100 mg Oral QHS Wendee Beavers T, MD   100 mg at 07/15/19 2050  . Resource ThickenUp Clear   Oral PRN Donne Hazel, MD      . senna-docusate (Senokot-S) tablet 1 tablet  1 tablet Oral BID PRN Mercy Riding, MD   1 tablet at 07/11/19 2029     Discharge Medications: Please see  discharge summary for a list of discharge medications.  Relevant Imaging Results:  Relevant Lab Results:   Additional Information SS#: [id number]  Neysa Hotter Riddick, LCSWA

## 2019-07-16 NOTE — TOC Progression Note (Addendum)
Transition of Care [facility]) - Progression Note    Patient Details  Name: [name] MRN: [id number] Date of Birth: 1960-12-13  Transition of Care [facility]) CM/SW Contact  Nonda Lou, [place] Phone Number: 07/16/2019, 3:18 PM  Clinical Narrative:    CSW updated fl2, re-faxed to SNFs. Uploaded pasrr clinicals. CSW contacted patient's daughter Crystal to follow-up on possible guardianship and discharge plan,awaiting a call back. TOC team will continue to follow.   Expected Discharge Plan: Skilled Nursing Facility Barriers to Discharge: Awaiting State Approval (PASRR), Continued Medical Work up, Other (comment)(new trach, placed 4/16; needs to be 37 days old prior to SNF placement)  Expected Discharge Plan and Services Expected Discharge Plan: Skilled Nursing Facility     Post Acute Care Choice: Skilled Nursing Facility Living arrangements for the past 2 months: Group Home                                       Social Determinants of Health (SDOH) Interventions    Readmission Risk Interventions No flowsheet data found.

## 2019-07-16 NOTE — Progress Notes (Signed)
PROGRESS NOTE  [name] HYQ:[id number] DOB: Nov 18, 1960   PCP: [facility]  Patient is from: Home  DOA: 06/27/2019 LOS: 27  Brief Narrative / Interim history: 59yo with hx bipolar 1 do, HTN, schizoaffective disorder, seizures who presented with angioedema, presumed from ACEI. Pt required emergent tracheostomy and admitted to ICU. Now on trach collar and transferred to [facility] service as of 4/22.     Patient has been noncompliant with care refusing CBG monitoring some blood draws at times.  She is on dysphagia 3 diet.   Psych consulted for assistance with his psych medication and capacity assessment.  She was deemed to have no capacity due to poor insight into his situation.  Therapy recommended SNF.  PCCM following for trach.  Started capping trach on 5/3.  PCCM planning decannulation if she continues to tolerate capping for 48 hours which would be 5/5.   Subjective: Seen and examined earlier this morning.  No major events overnight or this morning.  She asks when she could go home as usual.  No complaints.  Objective: Vitals:   07/16/19 0341 07/16/19 0746 07/16/19 0816 07/16/19 1100  BP: (!) 167/101  (!) 179/105   Pulse: 89 92 92 82  Resp: 19 18 (!) 22 20  Temp: 98.3 F (36.8 C)  97.9 F (36.6 C)   TempSrc: Oral     SpO2: 94% 96% 97%   Weight:       No intake or output data in the 24 hours ending 07/16/19 1153 Filed Weights   07/07/19 0439 07/08/19 0423 07/14/19 0500  Weight: 115.4 kg 114.3 kg 109.2 kg    Examination:  GENERAL: No apparent distress.  Nontoxic. HEENT: MMM.  Vision and hearing grossly intact.  NECK: Capped Trach in place. RESP: On room air.  No IWOB.  Fair aeration bilaterally. CVS:  RRR. Heart sounds normal.  ABD/GI/GU: Bowel sounds present. Soft. Non tender.  MSK/EXT:  Moves extremities. No apparent deformity. No edema.  SKIN: no apparent skin lesion or wound NEURO: Awake, alert and oriented appropriately.  No apparent  focal neuro deficit. PSYCH: Calm. Normal affect.  Procedures:  4/15-emergent tracheostomy  Microbiology summarized: COVID-19 PCR negative. Influenza PCR negative. MRSA PCR negative.  Assessment & Plan: Acute hypoxemic respiratory failure due to upper airway compromise from angioedema  -Angioedema thought to be due to ACE inhibitor's or cocaine.  UDS positive for cocaine and benzo. -Lisinopril added to allergy list.  -PCCM following-4/15-emergent trach>>> #4 cuffless on 4/29>> capping trial on 5/3.  Plan for decannulation -Continued famotidine. -Therapy recommended CIR which recommended SNF.  She wants to go home but has poor insight and deemed to lack capacity. -Diet advanced to dysphagia-3 on 4/28.  New diagnosis of DM-2 with hyperglycemia: A1c 7.8%.  Hyperglycemia improved after stopping steroid. Recent Labs    07/15/19 2023 07/16/19 0616 07/16/19 0812  GLUCAP 208* 152* 134*  -Continue SSI-moderate and Lantus 5 units twice daily -Continue statin.   Essential hypertension: Normotensive. -Lisinopril discontinued due to angioedema. -Continue labetalol 100 mg twice daily and HCTZ 12.5 mg daily.  Schizoaffective disorder/bipolar disorder-somewhat upset and irritable -Per report, [facility] July 2020 was taking: Cogentin 0.'5mg'$ , Quetiapine '100mg'$ , Depakote '500mg'$ , Topiramate '50mg'$ , and Invega sustenna (injection).Pharmacy reports she has only filled Haldol -Continue Quetiapine, as needed Haldol and Klonopin -On Invega q 30 days prior to admit -Appreciate psych input-no capacity.  Her legal guardian seems to have changed her phone number.   History of seizure: Depakote on her  medication list back in 2017.  Not sure if she is still taking. -Not on AED since admission.  Noncompliance with care-refuses blood draw and meds at times: Has no capacity per psych. -Encourage compliance.  Morbid obesity: Body mass index is 37.71 kg/m.  Has diabetes. -Encourage lifestyle change to  lose weight.   Nutrition Problem: Inadequate oral intake Etiology: inability to eat  Signs/Symptoms: NPO status  Interventions: MVI, Magic cup, Hormel Shake       DVT prophylaxis: Subcu Lovenox Code Status: Full code Family Communication: Attempted to call Elmyra Ricks (legal guardian) for update but the phone number listed in the chart is no longer hurts.  Attempted to call his son and daughter but no answer.  Status is: Inpatient  Remains inpatient appropriate because:Unsafe d/c plan and clearance by consultants.   Dispo: The patient is from: Home              Anticipated d/c is to: SNF              Anticipated d/c date is: 2 days              Patient currently is not medically stable to d/c.        Consultants:  PCCM Psychiatry   Sch Meds:  Scheduled Meds: . amLODipine  5 mg Oral Daily  . chlorhexidine gluconate (MEDLINE KIT)  15 mL Mouth Rinse BID  . Chlorhexidine Gluconate Cloth  6 each Topical Daily  . enoxaparin (LOVENOX) injection  40 mg Subcutaneous Q24H  . famotidine  20 mg Oral BID  . hydrochlorothiazide  12.5 mg Oral Daily  . insulin aspart  0-5 Units Subcutaneous QHS  . insulin aspart  0-9 Units Subcutaneous TID WC  . insulin glargine  10 Units Subcutaneous QHS  . labetalol  100 mg Oral BID  . multivitamin with minerals  1 tablet Oral Daily  . nicotine  21 mg Transdermal Daily  . paliperidone  234 mg Intramuscular Once  . QUEtiapine  100 mg Oral QHS   Continuous Infusions: . sodium chloride Stopped (06/28/19 1431)   PRN Meds:.sodium chloride, clonazePAM, fentaNYL (SUBLIMAZE) injection, fentaNYL (SUBLIMAZE) injection, haloperidol lactate, hydrALAZINE, oxyCODONE-acetaminophen, phenol, polyethylene glycol, Resource ThickenUp Clear, senna-docusate  Antimicrobials: Anti-infectives (From admission, onward)   None       I have personally reviewed the following labs and images: CBC: Recent Labs  Lab 07/11/19 0545 07/12/19 0241  WBC 10.3 7.8   HGB 13.4 12.5  HCT 42.5 39.3  MCV 90.8 91.4  PLT 391 424*   BMP &GFR Recent Labs  Lab 07/11/19 0545 07/12/19 0241  NA 137 137  K 4.0 3.8  CL 98 98  CO2 29 31  GLUCOSE 244* 134*  BUN 16 13  CREATININE 0.74 0.72  CALCIUM 9.4 9.0  MG 2.0 1.9  PHOS 3.2 3.6   Estimated Creatinine Clearance: 98.7 mL/min (by C-G formula based on SCr of 0.72 mg/dL). Liver & Pancreas: Recent Labs  Lab 07/11/19 0545 07/12/19 0241  ALBUMIN 2.9* 2.8*   No results for input(s): LIPASE, AMYLASE in the last 168 hours. No results for input(s): AMMONIA in the last 168 hours. Diabetic: No results for input(s): HGBA1C in the last 72 hours. Recent Labs  Lab 07/15/19 1201 07/15/19 1653 07/15/19 2023 07/16/19 0616 07/16/19 0812  GLUCAP 183* 169* 208* 152* 134*   Cardiac Enzymes: No results for input(s): CKTOTAL, CKMB, CKMBINDEX, TROPONINI in the last 168 hours. No results for input(s): PROBNP in the last 8760 hours. Coagulation  Profile: No results for input(s): INR, PROTIME in the last 168 hours. Thyroid Function Tests: No results for input(s): TSH, T4TOTAL, FREET4, T3FREE, THYROIDAB in the last 72 hours. Lipid Profile: No results for input(s): CHOL, HDL, LDLCALC, TRIG, CHOLHDL, LDLDIRECT in the last 72 hours. Anemia Panel: No results for input(s): VITAMINB12, FOLATE, FERRITIN, TIBC, IRON, RETICCTPCT in the last 72 hours. Urine analysis:    Component Value Date/Time   COLORURINE YELLOW 06/21/2019 0625   APPEARANCEUR CLEAR 06/21/2019 0625   LABSPEC 1.011 06/21/2019 0625   PHURINE 5.0 06/21/2019 0625   GLUCOSEU NEGATIVE 06/21/2019 0625   HGBUR SMALL (A) 06/21/2019 0625   BILIRUBINUR NEGATIVE 06/21/2019 0625   KETONESUR NEGATIVE 06/21/2019 0625   PROTEINUR NEGATIVE 06/21/2019 0625   UROBILINOGEN 0.2 12/08/2014 1937   NITRITE NEGATIVE 06/21/2019 0625   LEUKOCYTESUR SMALL (A) 06/21/2019 0625   Sepsis Labs: Invalid input(s): PROCALCITONIN, [place]  Microbiology: No results found for  this or any previous visit (from the past 240 hour(s)).  Radiology Studies: No results found.   Breuna Loveall T. [place]  If 7PM-7AM, please contact night-coverage [email or web address] Password [email or web address] 07/16/2019, 11:53 AM

## 2019-07-17 DIAGNOSIS — Z930 Tracheostomy status: Secondary | ICD-10-CM | POA: Diagnosis not present

## 2019-07-17 DIAGNOSIS — T783XXS Angioneurotic edema, sequela: Secondary | ICD-10-CM | POA: Diagnosis not present

## 2019-07-17 LAB — BASIC METABOLIC PANEL
Anion gap: 13 (ref 5–15)
BUN: 8 mg/dL (ref 6–20)
CO2: 27 mmol/L (ref 22–32)
Calcium: 9.6 mg/dL (ref 8.9–10.3)
Chloride: 100 mmol/L (ref 98–111)
Creatinine, Ser: 0.64 mg/dL (ref 0.44–1.00)
GFR calc Af Amer: >60 mL/min (ref >60)
GFR calc non Af Amer: >60 mL/min (ref >60)
Glucose, Bld: 156 mg/dL — ABNORMAL HIGH (ref 70–99)
Potassium: 4.3 mmol/L (ref 3.5–5.1)
Sodium: 140 mmol/L (ref 135–145)

## 2019-07-17 LAB — CBC
HCT: 42.5 % (ref 36.0–46.0)
Hemoglobin: 13.5 g/dL (ref 12.0–15.0)
MCH: 28.8 pg (ref 26.0–34.0)
MCHC: 31.8 g/dL (ref 30.0–36.0)
MCV: 90.8 fL (ref 80.0–100.0)
Platelets: 432 10*3/uL — ABNORMAL HIGH (ref 150–400)
RBC: 4.68 MIL/uL (ref 3.87–5.11)
RDW: 13.2 % (ref 11.5–15.5)
WBC: 5.5 10*3/uL (ref 4.0–10.5)
nRBC: 0 % (ref 0.0–0.2)

## 2019-07-17 LAB — GLUCOSE, CAPILLARY
Glucose-Capillary: 170 mg/dL — ABNORMAL HIGH (ref 70–99)
Glucose-Capillary: 127 mg/dL — ABNORMAL HIGH (ref 70–99)
Glucose-Capillary: 166 mg/dL — ABNORMAL HIGH (ref 70–99)

## 2019-07-17 MED ORDER — ENSURE ENLIVE PO LIQD
237.0000 mL | Freq: Three times a day (TID) | ORAL | Status: DC
  Administered 2019-07-17 – 2019-07-19 (×5): 237 mL via ORAL

## 2019-07-17 NOTE — Progress Notes (Signed)
Patient was decannulated per MD note. Patient tolerated well. Patient did not drop sats. Currently 96% on room air. Patient has slight leak at trach site. Trach site looks good, no redness or pain. Covered with 4x4 gauze. Vitals stable. RT will continue to monitor.

## 2019-07-17 NOTE — Progress Notes (Signed)
Physical Therapy Treatment Patient Details Name: [name] MRN: [id number] DOB: [birth year] Today's Date: 07/17/2019    History of Present Illness Pt is a 59 year old woman who presented with angioedema and airway compromise secondary to ACE inhibitor use. The decision was made to intubate the patient for airway protection and anesthesia was called for difficult airway. Unfortunately the anesthesiologist was unable to intubate the patient and she was taken emergently to OR for tracheostomy, decannulated on 5/4. PMH is significant for HTN, schizoaffective disorder, bipolar disorder, seizures, and (cocaine) substance abuse.    PT Comments    Pt restless and impulsive with mobility this day, but requires only min assist for mobility to steady and direct pt. Pt ambulated good hallway distance with standing rest break, and this PT feels pt ambulated safer without RW today because pt with very poor use of RW, picking it up and weaving it up the hallway. PT continuing to recommend SNF level of care given pt's lack of safety awareness and mobility deficits.    Follow Up Recommendations  Supervision/Assistance - 24 hour;SNF     Equipment Recommendations  Rolling walker with 5" wheels;3in1 (PT)    Recommendations for Other Services       Precautions / Restrictions Precautions Precautions: Fall Precaution Comments: Pt extremely impulsive. Poor safety awareness.  Restrictions Weight Bearing Restrictions: No    Mobility  Bed Mobility Overal bed mobility: Needs Assistance Bed Mobility: Supine to Sit     Supine to sit: Supervision     General bed mobility comments: for safety, increased time and effort with use of bedrails.  Transfers Overall transfer level: Needs assistance Equipment used: None Transfers: Sit to/from Stand Sit to Stand: Min guard;From elevated surface         General transfer comment: min guard for safety  Ambulation/Gait Ambulation/Gait assistance: Min  assist Gait Distance (Feet): 100 Feet(2x100) Assistive device: Rolling walker (2 wheeled);None Gait Pattern/deviations: Step-through pattern;Decreased stride length;Trunk flexed;Drifts right/left Gait velocity: decr   General Gait Details: Min assist to steady, guide pt trajectory. Pt initially using RW but very unsafe, picking it off the ground and tilting it side to side. Transitioned to no AD with min assist from PT to steady.   Stairs             Wheelchair Mobility    Modified Rankin (Stroke Patients Only)       Balance Overall balance assessment: Needs assistance Sitting-balance support: Bilateral upper extremity supported;Feet supported Sitting balance-Leahy Scale: Fair     Standing balance support: During functional activity;No upper extremity supported Standing balance-Leahy Scale: Fair Standing balance comment: cannot tolerate challenge well                            Cognition Arousal/Alertness: Awake/alert Behavior During Therapy: Impulsive Overall Cognitive Status: No family/caregiver present to determine baseline cognitive functioning                                 General Comments: pt lacks insight into deficits and safety awareness, stating "I am going home, I only need me and God to take care of me"      Exercises      General Comments        Pertinent Vitals/Pain Pain Assessment: No/denies pain Pain Intervention(s): Limited activity within patient's tolerance;Monitored during session    Home Living  Prior Function            PT Goals (current goals can now be found in the care plan section) Acute Rehab PT Goals PT Goal Formulation: With patient Time For Goal Achievement: 07/24/19 Potential to Achieve Goals: Good Progress towards PT goals: Progressing toward goals    Frequency    Min 2X/week      PT Plan Current plan remains appropriate    Co-evaluation               AM-PAC PT "6 Clicks" Mobility   Outcome Measure  Help needed turning from your back to your side while in a flat bed without using bedrails?: A Little Help needed moving from lying on your back to sitting on the side of a flat bed without using bedrails?: A Little Help needed moving to and from a bed to a chair (including a wheelchair)?: A Little Help needed standing up from a chair using your arms (e.g., wheelchair or bedside chair)?: A Little Help needed to walk in hospital room?: A Little Help needed climbing 3-5 steps with a railing? : A Lot 6 Click Score: 17    End of Session Equipment Utilized During Treatment: Gait belt Activity Tolerance: Patient tolerated treatment well Patient left: in chair;with call bell/phone within reach;with chair alarm set Nurse Communication: Mobility status PT Visit Diagnosis: Unsteadiness on feet (R26.81);Other abnormalities of gait and mobility (R26.89)     Time: 1211-1221 PT Time Calculation (min) (ACUTE ONLY): 10 min  Charges:  $Gait Training: 8-22 mins                     Carlton Buskey E, PT [place] Pager [phone]  Office [phone]    [name] 07/17/2019, 3:44 PM

## 2019-07-17 NOTE — Progress Notes (Signed)
Pulmonary Progress Note  Subjective/interval Tolerating capped trach, now for 48 hours Desaturations o/n while sleeping, with exertion per patient's report - I do not see documented  Objective BP (!) 165/102   Pulse (!) 102   Temp 98.8 F (37.1 C) (Oral)   Resp 18   Wt 109.2 kg   LMP 01/13/2012   SpO2 95%   BMI 37.71 kg/m   General: obese woman, comfortable in bed HEENT: #4 Cuffless Trach in Place.  Remains capped. No stridor. No secretions. Tongue normal size and motion. Strong voice and cough Neuro: non focal CV: RRR no M PULM: distant, no wheeze. On RA and tolerating while awake Abd: no distended  Extremities: warm/dry,  Trace edema  Skin: no rash   Assessment   Acute hypoxemic respiratory failure due to angioedema s/p emergent trach- Had lingering glottic edema which precluded her downsizing progress.  Able to down size to 4 cuffless 4/29.   - did not tolerate capping trial 5/1 Plan Now has tolerated capping x 48h >> agree with decannulation today. Will ask RT to perform Suspect she may have OSA / OHS; may need d/c w exertional O2, will need PSG at some point if feasible given her psychiatric disease.    Levy Pupa, MD, PhD 07/17/2019, 8:02 AM [facility] [phone] or if no answer [phone]

## 2019-07-17 NOTE — Progress Notes (Signed)
PROGRESS NOTE    [name]  EVO:[id number] DOB: 1960-11-23 DOA: 06/27/2019 PCP: Medicine, [facility]   Brief Narrative: 59 year old with past medical history significant for bipolar type I, hypertension, schizoaffective disorder, seizure who presents with angioedema present from ACE inhibitor.  Patient required emergent tracheostomy and admitted to ICU.  Subsequently placed on trach collar and transferred to [facility] service on 4/22.  Patient has not been compliant with care and refusing CBG and blood drawn.  She is on dysphagia 3 diet. Psych consulted for assistance with psychiatric medication capacity assessment.  She was deemed to have no capacity due to poor insight into her situation. Therapy recommending skilled nursing facility. PCCM following for trach care.  Patient was started capping trach on 5/3.  Patient underwent decannulation on 5\5.    Assessment & Plan:   Active Problems:   Angioedema   Acute respiratory failure (HCC)   Tracheostomy status (HCC)   Airway compromise   1-Acute hypoxic respiratory failure due to upper airway compromise from angioedema: -Angioedema thought to be secondary to ACE inhibitors or cocaine. -UDS positive for cocaine and benzos -Lisinopril was added to allergy list -PCCM needs emergent tracheostomy on 4/15 >>> #4 cough last on 4/29>>> capping trial on 5\3. -Decannulation on 5/5 -Physical therapy is recommending a skilled nursing facility, patient wants to go home but she has poor insight and deemed to lack capacity per psych evaluation.  2-New diagnosis of diabetes with hyperglycemia: Hemoglobin A1c 7.8 Hyperglycemia improve after stopping steroid Lantus and sliding scale insulin Statins.  3-Hypertension:  Lisinopril discontinued due to angioedema Continue with labetalol and hydrochlorothiazide  4-schizoaffective disorder/bipolar disorder Patient irritable at times. --Per report, [facility] D/[facility] July 2020 was taking:  Cogentin 0.'5mg'$ , Quetiapine '100mg'$ , Depakote '500mg'$ , Topiramate '50mg'$ , and Invega sustenna (injection).Pharmacy reports she has only filled Haldol -Continue with quetiapine, as needed Haldol and Klonopin On Invega every 30 days to admit  5-history of seizure:  She was not taking Depakote prior to admission We will ask pharmacy to review.  6-noncompliance with care, refused blood drawn and meds at time Patient lack capacity per side  Morbid obesity, body mass index 37 Encouraged lifestyle changes.  Nutrition Problem: Inadequate oral intake Etiology: inability to eat    Signs/Symptoms: NPO status    Interventions: MVI, Magic cup, Hormel Shake  Estimated body mass index is 37.71 kg/m as calculated from the following:   Height as of 06/21/19: '5\' 7"'$  (1.702 m).   Weight as of this encounter: 109.2 kg.   DVT prophylaxis: Lovenox Code Status: Full code Family Communication: No family at bedside Disposition Plan:  Patient is from: Group home Anticipated d/c date: 07/18/2019 Anticipated d/c is to;  SNF;  Barriers to d/c or necessity for inpatient status: Monitor 24-48 hrs. after decannulation  Consultants:   PCCM, psychiatrist  Procedures:  4/15-emergent tracheostomy  Antimicrobials:  None   Subjective: She is alert, she is complaining of pain at the trach site. Denies dyspnea  Objective: Vitals:   07/16/19 2140 07/17/19 0754 07/17/19 1044 07/17/19 1144  BP:   (!) 156/101   Pulse:  (!) 102 (!) 108 95  Resp:  '18 18 16  '$ Temp:   98.8 F (37.1 C)   TempSrc:   Oral   SpO2: 94% 95% 100% 96%  Weight:       No intake or output data in the 24 hours ending 07/17/19 1336 Filed Weights   07/07/19 0439 07/08/19 0423 07/14/19 0500  Weight: 115.4 kg 114.3  kg 109.2 kg    Examination:  General exam: Appears calm and comfortable  Respiratory system: Clear to auscultation. Respiratory effort normal.  Decannulated, trach stoma covered with clean gauze Cardiovascular system: S1  & S2 heard, RRR. No JVD, murmurs, rubs, gallops or clicks. No pedal edema. Gastrointestinal system: Abdomen is nondistended, soft and nontender. No organomegaly or masses felt. Normal bowel sounds heard. Central nervous system: Alert and oriented.  Extremities: Symmetric 5 x 5 power. Skin: No rashes, lesions or ulcers    Data Reviewed: I have personally reviewed following labs and imaging studies  CBC: Recent Labs  Lab 07/11/19 0545 07/12/19 0241 07/17/19 0816  WBC 10.3 7.8 5.5  HGB 13.4 12.5 13.5  HCT 42.5 39.3 42.5  MCV 90.8 91.4 90.8  PLT 391 424* 628*   Basic Metabolic Panel: Recent Labs  Lab 07/11/19 0545 07/12/19 0241 07/17/19 0816  NA 137 137 140  K 4.0 3.8 4.3  CL 98 98 100  CO2 '29 31 27  '$ GLUCOSE 244* 134* 156*  BUN '16 13 8  '$ CREATININE 0.74 0.72 0.64  CALCIUM 9.4 9.0 9.6  MG 2.0 1.9  --   PHOS 3.2 3.6  --    GFR: Estimated Creatinine Clearance: 98.7 mL/min (by C-G formula based on SCr of 0.64 mg/dL). Liver Function Tests: Recent Labs  Lab 07/11/19 0545 07/12/19 0241  ALBUMIN 2.9* 2.8*   No results for input(s): LIPASE, AMYLASE in the last 168 hours. No results for input(s): AMMONIA in the last 168 hours. Coagulation Profile: No results for input(s): INR, PROTIME in the last 168 hours. Cardiac Enzymes: No results for input(s): CKTOTAL, CKMB, CKMBINDEX, TROPONINI in the last 168 hours. BNP (last 3 results) No results for input(s): PROBNP in the last 8760 hours. HbA1C: No results for input(s): HGBA1C in the last 72 hours. CBG: Recent Labs  Lab 07/16/19 0812 07/16/19 1233 07/16/19 1554 07/16/19 2031 07/17/19 1206  GLUCAP 134* 131* 131* 234* 170*   Lipid Profile: No results for input(s): CHOL, HDL, LDLCALC, TRIG, CHOLHDL, LDLDIRECT in the last 72 hours. Thyroid Function Tests: No results for input(s): TSH, T4TOTAL, FREET4, T3FREE, THYROIDAB in the last 72 hours. Anemia Panel: No results for input(s): VITAMINB12, FOLATE, FERRITIN, TIBC, IRON,  RETICCTPCT in the last 72 hours. Sepsis Labs: No results for input(s): PROCALCITON, LATICACIDVEN in the last 168 hours.  No results found for this or any previous visit (from the past 240 hour(s)).       Radiology Studies: No results found.      Scheduled Meds: . amLODipine  5 mg Oral Daily  . chlorhexidine gluconate (MEDLINE KIT)  15 mL Mouth Rinse BID  . Chlorhexidine Gluconate Cloth  6 each Topical Daily  . enoxaparin (LOVENOX) injection  40 mg Subcutaneous Q24H  . famotidine  20 mg Oral BID  . feeding supplement (ENSURE ENLIVE)  237 mL Oral TID BM  . hydrochlorothiazide  12.5 mg Oral Daily  . insulin aspart  0-5 Units Subcutaneous QHS  . insulin aspart  0-9 Units Subcutaneous TID WC  . insulin glargine  10 Units Subcutaneous QHS  . labetalol  100 mg Oral BID  . multivitamin with minerals  1 tablet Oral Daily  . nicotine  21 mg Transdermal Daily  . paliperidone  234 mg Intramuscular Once  . QUEtiapine  100 mg Oral QHS   Continuous Infusions: . sodium chloride Stopped (06/28/19 1431)     LOS: 19 days    Time spent: 35 minutes  Elmarie Shiley, MD [facility]   If 7PM-7AM, please contact night-coverage [email or web address]  07/17/2019, 1:36 PM

## 2019-07-17 NOTE — Progress Notes (Signed)
Nutrition Follow-up  DOCUMENTATION CODES:   Obesity unspecified  INTERVENTION:   - Continue MVI with minerals daily  - Ensure Enlive po TID, each supplement provides 350 kcal and 20 grams of protein  - d/c Hormel Shakes  NUTRITION DIAGNOSIS:   Inadequate oral intake related to inability to eat as evidenced by NPO status.  Progressing, pt now on Dysphagia 3 diet with thin liquids  GOAL:   Patient will meet greater than or equal to 90% of their needs  Progressing  MONITOR:   PO intake, Supplement acceptance, Diet advancement, Labs, Weight trends  REASON FOR ASSESSMENT:   Consult Assessment of nutrition requirement/status, Calorie Count  ASSESSMENT:   Pt with PMH of bipolar 1 disorder, HTN, obesity, sxhizo-affective schizophrenia, sz, and substance abuse admitted 4/15 for angioedema and had emergent trach placed.  4/15- admitted,emergent trach 4/16-Cortrak placed 4/20- trach downsized 4/21 - Cortrak replaced due to clog 4/27 - pt removed Cortrak, MBSS with diet advanced to Dysphagia 1 with nectar-thick liquids 4/28 - diet advanced to Dysphagia 3 with nectar-thick liquids 5/04 - diet advanced to Dysphagia 3 with thin liquids 5/05 - decannulated  Admission weight: 115.8 kg Current weight: 109.2 kg (07/14/19)  Pt requesting to speak with MD because she wants to go home. PO intake at meals has improved. Now that pt is on thin liquids, will d/c thickened shakes and order Ensure Enlive. 48-hour calorie count was completed and pt meeting 99% of kcal needs and 79% of protein needs by Day 2.  Meal Completion: 75-100% x last 3 meals  Medications reviewed and include: pepcid, SSI, Lantus 10 units daily, MVI with minerals  Labs reviewed. CBG's: 131-234 x 24 hours  Diet Order:   Diet Order            DIET DYS 3 Room service appropriate? Yes with Assist; Fluid consistency: Thin  Diet effective now              EDUCATION NEEDS:   No education needs have been  identified at this time  Skin:  Skin Assessment: Reviewed RN Assessment  Last BM:  07/09/19  Height:   Ht Readings from Last 1 Encounters:  06/21/19 5\' 7"  (1.702 m)    Weight:   Wt Readings from Last 1 Encounters:  07/14/19 109.2 kg    Ideal Body Weight:  61.3 kg  BMI:  Body mass index is 37.71 kg/m.  Estimated Nutritional Needs:   Kcal:  1700-1900 kcals  Protein:  85-100 grams  Fluid:  >/= 1.7 L    09/13/19, MS, RD, LDN Inpatient Clinical Dietitian Pager: [phone] Weekend/After Hours: [phone]

## 2019-07-17 NOTE — TOC Progression Note (Addendum)
Transition of Care [facility]) - Progression Note    Patient Details  Name: [name] MRN: [id number] Date of Birth: 03/17/1960  [facility] CM/SW Contact  Erin Sons, LCSW Phone Number: 07/17/2019, 9:20 AM  Clinical Narrative:     CSW uploaded clinicals to [facility]must for passr.   Expected Discharge Plan: Skilled Nursing Facility Barriers to Discharge: Awaiting State Approval (PASRR), Continued Medical Work up, Other (comment)(new trach, placed 4/16; needs to be 6 days old prior to SNF placement)  Expected Discharge Plan and Services Expected Discharge Plan: Skilled Nursing Facility     Post Acute Care Choice: Skilled Nursing Facility Living arrangements for the past 2 months: Group Home                                       Social Determinants of Health (SDOH) Interventions    Readmission Risk Interventions No flowsheet data found.

## 2019-07-17 NOTE — Progress Notes (Signed)
RE: [name]      Date of Birth:  May 28, 1960    Date:  07/17/2019        To Whom It May Concern:  Please be advised that the above-named patient will require a short-term nursing home stay - anticipated 30 days or less for rehabilitation and strengthening.  The plan is for return home.                 MD signature                Date

## 2019-07-17 NOTE — Progress Notes (Signed)
Patient was resting upon arrival, vitals are stable. [name] site looks good. No redness, swelling or pain. No distress. RT will continue to monitor.

## 2019-07-18 DIAGNOSIS — T783XXS Angioneurotic edema, sequela: Secondary | ICD-10-CM | POA: Diagnosis not present

## 2019-07-18 LAB — GLUCOSE, CAPILLARY
Glucose-Capillary: 148 mg/dL — ABNORMAL HIGH (ref 70–99)
Glucose-Capillary: 188 mg/dL — ABNORMAL HIGH (ref 70–99)
Glucose-Capillary: 114 mg/dL — ABNORMAL HIGH (ref 70–99)
Glucose-Capillary: 209 mg/dL — ABNORMAL HIGH (ref 70–99)

## 2019-07-18 NOTE — Progress Notes (Signed)
Stoma site looks good, no redness. Patient tolerating well. SPO2 97%. Patient has gauze covering stoma site.

## 2019-07-18 NOTE — Progress Notes (Signed)
Pt seems to very adamant on not going to a SNF, exhibiting anxiety when talking about to possibiilty of not going back home to live alone. Pt educated on the role of a SNF, but still not thinking that she needs a SNF.

## 2019-07-18 NOTE — Progress Notes (Signed)
Pt refused morning CBG d/t "being sleep". Dr. Antionette Char informed.

## 2019-07-18 NOTE — Plan of Care (Signed)

## 2019-07-18 NOTE — Progress Notes (Signed)
Pt ambulated on the unit O2 stats were 95-98% MD informed. Pt stated that she would like to D/C home and not to a SNF.

## 2019-07-18 NOTE — Progress Notes (Signed)
Pt refuesed Norvasc this morning Md was made aware.

## 2019-07-18 NOTE — Progress Notes (Signed)
PROGRESS NOTE    [name]  TWS:[id number] DOB: 1960-12-10 DOA: 06/27/2019 PCP: [facility], [facility]   Brief Narrative: 59 year old with past medical history significant for bipolar type I, hypertension, schizoaffective disorder, seizure who presents with angioedema present from ACE inhibitor.  Patient required emergent tracheostomy and admitted to ICU.  Subsequently placed on trach collar and transferred to [facility] service on 4/22.  Patient has not been compliant with care and refusing CBG and blood drawn.  She is on dysphagia 3 diet. Psych consulted for assistance with psychiatric medication capacity assessment.  She was deemed to have no capacity due to poor insight into her situation. Therapy recommending skilled nursing facility. PCCM following for trach care.  Patient was started capping trach on 5/3.  Patient underwent decannulation on 5\5.    Assessment & Plan:   Active Problems:   Angioedema   Acute respiratory failure (HCC)   Tracheostomy status (HCC)   Airway compromise   1-Acute hypoxic respiratory failure due to upper airway compromise from angioedema: -Angioedema thought to be secondary to ACE inhibitors or cocaine. -UDS positive for cocaine and benzos -Lisinopril was added to allergy list -PCCM needs emergent tracheostomy on 4/15 >>> #4 cough last on 4/29>>> capping trial on 5\3. -Decannulation on 5/5 -Physical therapy is recommending a skilled nursing facility, patient wants to go home but she has poor insight and deemed to lack capacity per psych evaluation. Stable post decannulation. Needs to follow up with CCM office 5/21. She will need PSG   2-New diagnosis of diabetes with hyperglycemia: Hemoglobin A1c 7.8 Hyperglycemia improve after stopping steroid Lantus and sliding scale insulin Statins.  3-Hypertension:  Lisinopril discontinued due to angioedema Continue with labetalol and hydrochlorothiazide, norvasc.  Refuse meds at  times  4-schizoaffective disorder/bipolar disorder Patient irritable at times. --Per report, [facility] July 2020 was taking: Cogentin 0.72m, Quetiapine 1073m Depakote 50030mTopiramate 48m70mnd Invega sustenna (injection).Pharmacy reports she has only filled Haldol -Continue with quetiapine, as needed Haldol and Klonopin On Invega every 30 days to admit  5-history of seizure:  Patient was on Depakote for mood stabilization, last taking in 2017  6-noncompliance with care, refused blood drawn and meds at time Patient lack capacity per side  Morbid obesity, body mass index 37 Encouraged lifestyle changes.  Nutrition Problem: Inadequate oral intake Etiology: inability to eat    Signs/Symptoms: NPO status    Interventions: MVI, Magic cup, Hormel Shake  Estimated body mass index is 37.5 kg/m as calculated from the following:   Height as of 06/21/19: 5' 7" (1.702 m).   Weight as of this encounter: 108.6 kg.   DVT prophylaxis: Lovenox Code Status: Full code Family Communication: No family at bedside Disposition Plan:  Patient is from: Group home Anticipated d/c date: 07/18/2019 Anticipated d/c is to;  SNF;  Barriers to d/c or necessity for inpatient status: stable for discharge, awaiting SNF  Consultants:   PCCM, psychiatrist  Procedures:  4/15-emergent tracheostomy  Antimicrobials:  None   Subjective: She denies worsening dyspnea.  She wanst to go home   Objective: Vitals:   07/17/19 2040 07/17/19 2110 07/18/19 0559 07/18/19 0729  BP: (!) 143/103 (!) 144/110    Pulse: (!) 103 (!) 109  85  Resp: 16   18  Temp:  98.5 F (36.9 C)    TempSrc:  Oral    SpO2: 97% 96%  97%  Weight:   108.6 kg    No intake or output data in the 24 hours  ending 07/18/19 1256 Filed Weights   07/08/19 0423 07/14/19 0500 07/18/19 0559  Weight: 114.3 kg 109.2 kg 108.6 kg    Examination:  General exam: NAD, Trach stoma cover with clean gauze Respiratory system:  CTA Cardiovascular system: S 1, S 2 RRR Gastrointestinal system: BS present, soft, nt Central nervous system: Alert and oriented  Extremities: No edema    Data Reviewed: I have personally reviewed following labs and imaging studies  CBC: Recent Labs  Lab 07/12/19 0241 07/17/19 0816  WBC 7.8 5.5  HGB 12.5 13.5  HCT 39.3 42.5  MCV 91.4 90.8  PLT 424* 532*   Basic Metabolic Panel: Recent Labs  Lab 07/12/19 0241 07/17/19 0816  NA 137 140  K 3.8 4.3  CL 98 100  CO2 31 27  GLUCOSE 134* 156*  BUN 13 8  CREATININE 0.72 0.64  CALCIUM 9.0 9.6  MG 1.9  --   PHOS 3.6  --    GFR: Estimated Creatinine Clearance: 98.5 mL/min (by C-G formula based on SCr of 0.64 mg/dL). Liver Function Tests: Recent Labs  Lab 07/12/19 0241  ALBUMIN 2.8*   No results for input(s): LIPASE, AMYLASE in the last 168 hours. No results for input(s): AMMONIA in the last 168 hours. Coagulation Profile: No results for input(s): INR, PROTIME in the last 168 hours. Cardiac Enzymes: No results for input(s): CKTOTAL, CKMB, CKMBINDEX, TROPONINI in the last 168 hours. BNP (last 3 results) No results for input(s): PROBNP in the last 8760 hours. HbA1C: No results for input(s): HGBA1C in the last 72 hours. CBG: Recent Labs  Lab 07/16/19 2031 07/17/19 1206 07/17/19 1658 07/17/19 2108 07/18/19 0816  GLUCAP 234* 170* 127* 166* 148*   Lipid Profile: No results for input(s): CHOL, HDL, LDLCALC, TRIG, CHOLHDL, LDLDIRECT in the last 72 hours. Thyroid Function Tests: No results for input(s): TSH, T4TOTAL, FREET4, T3FREE, THYROIDAB in the last 72 hours. Anemia Panel: No results for input(s): VITAMINB12, FOLATE, FERRITIN, TIBC, IRON, RETICCTPCT in the last 72 hours. Sepsis Labs: No results for input(s): PROCALCITON, LATICACIDVEN in the last 168 hours.  No results found for this or any previous visit (from the past 240 hour(s)).       Radiology Studies: No results found.      Scheduled  Meds: . amLODipine  5 mg Oral Daily  . chlorhexidine gluconate (MEDLINE KIT)  15 mL Mouth Rinse BID  . Chlorhexidine Gluconate Cloth  6 each Topical Daily  . enoxaparin (LOVENOX) injection  40 mg Subcutaneous Q24H  . famotidine  20 mg Oral BID  . feeding supplement (ENSURE ENLIVE)  237 mL Oral TID BM  . hydrochlorothiazide  12.5 mg Oral Daily  . insulin aspart  0-5 Units Subcutaneous QHS  . insulin aspart  0-9 Units Subcutaneous TID WC  . insulin glargine  10 Units Subcutaneous QHS  . labetalol  100 mg Oral BID  . multivitamin with minerals  1 tablet Oral Daily  . nicotine  21 mg Transdermal Daily  . paliperidone  234 mg Intramuscular Once  . QUEtiapine  100 mg Oral QHS   Continuous Infusions: . sodium chloride Stopped (06/28/19 1431)     LOS: 20 days    Time spent: 35 minutes     Lesley Galentine A Eilish Mcdaniel, MD [facility]   If 7PM-7AM, please contact night-coverage [email or web address]  07/18/2019, 12:56 PM

## 2019-07-18 NOTE — Progress Notes (Addendum)
Pulmonary Progress Note  Subjective/interval Decannulated yesterday 5/5. Tolerated well.  Eager to go home.  Objective BP (!) 144/110 (BP Location: Right Arm)   Pulse 85   Temp 98.5 F (36.9 C) (Oral)   Resp 18   Wt 108.6 kg   LMP 01/13/2012   SpO2 97%   BMI 37.50 kg/m   General: Adult female, resting in bed, in NAD. Neuro: A&O x 3, no deficits. HEENT: [place]/AT. Sclerae anicteric. EOMI.  Trach dressing C/D/I with no drainage. Cardiovascular: RRR, no M/R/G.  Lungs: Respirations even and unlabored.  CTA bilaterally, No W/R/R. Abdomen: Obese.  BS x 4, soft, NT/ND.  Musculoskeletal: No gross deformities, no edema.  Skin: Intact, warm, no rashes.  Assessment   Acute hypoxemic respiratory failure due to angioedema s/p emergent trach- Had lingering glottic edema which precluded her downsizing progress.  Able to down size to 4 cuffless 4/29 and ultimately decannulated 5/5. - Routine trach site care with daily dressing changes. - Assess ambulatory desaturation study to ensure no home O2 needs.  Possible underlying OSA / OHS. - Outpatient follow up in our office scheduled for 08/02/19.  Can consider PSG at some point if feasible given her psychiatric disease.   Nothing further to add.  PCCM will sign off.  Please do not hesitate to call us back if we can be of any further assistance.   Rutherford Guys, [place] [facility] 07/18/2019, 9:35 AM

## 2019-07-19 LAB — GLUCOSE, CAPILLARY
Glucose-Capillary: 160 mg/dL — ABNORMAL HIGH (ref 70–99)
Glucose-Capillary: 130 mg/dL — ABNORMAL HIGH (ref 70–99)

## 2019-07-19 MED ORDER — NICOTINE 21 MG/24HR TD PT24: 21.0000 mg | MEDICATED_PATCH | Freq: Every day | TRANSDERMAL | 0 refills | Status: DC

## 2019-07-19 MED ORDER — AMLODIPINE BESYLATE 5 MG PO TABS: 5.0000 mg | ORAL_TABLET | Freq: Every day | ORAL | 0 refills | Status: DC

## 2019-07-19 MED ORDER — QUETIAPINE FUMARATE 100 MG PO TABS: 100.0000 mg | ORAL_TABLET | Freq: Every day | ORAL | 0 refills | Status: DC

## 2019-07-19 MED ORDER — METFORMIN HCL 500 MG PO TABS: 500.0000 mg | ORAL_TABLET | Freq: Two times a day (BID) | ORAL | 11 refills | Status: DC

## 2019-07-19 MED ORDER — HYDROCHLOROTHIAZIDE 12.5 MG PO CAPS: 12.5000 mg | ORAL_CAPSULE | Freq: Every day | ORAL | 0 refills | Status: DC

## 2019-07-19 MED ORDER — LABETALOL HCL 100 MG PO TABS: 100.0000 mg | ORAL_TABLET | Freq: Two times a day (BID) | ORAL | 0 refills | Status: DC

## 2019-07-19 MED ORDER — FAMOTIDINE 20 MG PO TABS: 20.0000 mg | ORAL_TABLET | Freq: Two times a day (BID) | ORAL | 0 refills | Status: DC

## 2019-07-19 MED FILL — FAMOTIDINE 20 MG TABS: 20 | 30 days supply | Qty: 60 | Fill #0

## 2019-07-19 MED FILL — QUETIAPINE FUMARATE 100 MG: 100 | 30 days supply | Qty: 30 | Fill #0

## 2019-07-19 MED FILL — LABETALOL HCL 100 MG TABLET: 100 | 30 days supply | Qty: 60 | Fill #0

## 2019-07-19 MED FILL — metFORMIN HCL 500 MG TABS: 500 | 30 days supply | Qty: 60 | Fill #0

## 2019-07-19 MED FILL — NICOTINE 21 MG/24HR PATCH: 21 | 28 days supply | Qty: 28 | Fill #0

## 2019-07-19 MED FILL — HYDROCHLOROTHIAZIDE 12.5 MG: 12.5 | 30 days supply | Qty: 30 | Fill #0

## 2019-07-19 NOTE — TOC Transition Note (Addendum)
Transition of Care [facility]) - CM/SW Discharge Note   Patient Details  Name: [name] MRN: [id number] Date of Birth: Feb 13, 1961  Transition of [facility]) CM/SW Contact:  Nonda Lou, LCSWA Phone Number: 07/19/2019, 4:17 PM   Clinical Narrative:    Patient refused SNF services and reported that she would like home health services.  CSW sent referral to multiple agencies for review. [facility] is able to accept patient. CSW discussed equipment needs with patient and reached out to [employer] to have RW and 3n1 delivered to the room. CSW confirmed PCP and address with patient.   CSW spoke with patient's [facility] ACTT team, Jens Som & Darryl Lucretia Roers [phone]. CSW informed ACTT team of patient's medication needs, specifically of new medication prescriptions. Darryl Wood agreed to assist patient with throwing out old medications, as well as agreed to provide patient transportation home. CSW updated team on patient's Home Health care that will be following-up at the home.  Patient's daughter Aggie Cosier was updated on patient's discharge plan. Prescriptions provided by [facility] and delivered to room by CSW. Discharge summary faxed to [facility] [phone].  No further questions reported at this time. CSW to continue to follow and assist with discharge planning needs.    Final next level of care: Home w Home Health Services Barriers to Discharge: No Barriers Identified   Patient Goals and CMS Choice Patient states their goals for this hospitalization and ongoing recovery are:: to go back home. CMS Medicare.gov Compare Post Acute Care list provided to:: Patient Choice offered to / list presented to : Patient  Discharge Placement                  Name of family member notified: Crystal    Discharge Plan and Services     Post Acute Care Choice: Skilled Nursing Facility          DME Arranged: 3-N-1, Dan Humphreys rolling DME Agency: [employer] Date DME Agency  Contacted: 07/19/19 Time DME Agency Contacted: [phone] Representative spoke with at DME Agency: Ian Malkin HH Arranged: PT, OT, Social Work, Nurse's Aide, RN HH Agency: [facility] Date HH Agency Contacted: 07/19/19 Time HH Agency Contacted: [phone] Representative spoke with at [facility] Agency: Cassie  Social Determinants of Health (SDOH) Interventions     Readmission Risk Interventions No flowsheet data found.

## 2019-07-19 NOTE — Consult Note (Signed)
Telepsychiatry Consult   Reason for Consult:  "Assessment of capacity" Referring Physician:  Dr Tyrell Antonio Patient Identification: [name] MRN:  [id number] Principal Diagnosis: Acute respiratory failure [facility]) Diagnosis:  Principal Problem:   Acute respiratory failure ([place]) Active Problems:   Schizoaffective disorder ([place])   Angioedema   Tracheostomy status ([place])   Airway compromise   Total Time spent with patient: 20 minutes  Subjective:   [name] is a 59 y.o. female patient.  Patient assessed by nurse practitioner.  Patient alert and oriented, patient sitting up in chair.  Patient speech is pressured and tangential, although she is easily redirected.  Patient states I want to go home. I been here long enough. It took me a long time to get into that apartment and I do not want to lose it. I can do everything at home and I do not need to stay here in the hospital or go to the rehab place. " Patient is able to identify her current living situation as 2 bed room apartment, married (husband is in prison), boyfriend, and has 2 children (son, 11; and daughter, 52). Patient states she was given the wrong medication and couldn't breath. " I was real bad off when I got here but the nurses are surprised as how good I am doing now. " Patient identifies faith as a strong background for her strength " I read the bible everyday and I pray to god. I prayed to god that I get better to get out of here. Now I need to go home. I cant read my bible in here. " When assessing for patients ability to complete her ADL's she states " I can cook, clean and take a bath. I cook all the time. What do want to eat? " When assessing for her diet she identifies not being able to eat certain foods but that she has plenty of food at home to cook and if she needs something she will call her ACT team. When assessing for the ability to maintain clean field for her dressing changes " the nurse told me how to do it. [place] has  closed now. I change the dressing if it gets wet or dirty. I wont need to change it but every week. I will not let it get infected mam. " Patient expresses interest in receiving home health services, and is requesting these as determined to be appropriate.  Patient denies suicidal ideations, homicidal ideations, and or hallucinations.   HPI: Patient presents with complaints of swelling to face lips and tongue.  Past Psychiatric History: Bipolar Schizophrenia  Risk to Self:  No Risk to Others:  No Prior Inpatient Therapy:  [facility], [facility], [facility] Prior Outpatient Therapy:  Current with [facility] Act Team  Past Medical History:  Past Medical History:  Diagnosis Date  . Arthritis   . Bipolar 1 disorder ([place])   . Hypertension   . Obesity   . Psychiatric illness   . Schizo-affective schizophrenia ([place])   . Seizures ([place])   . Substance abuse [facility])     Past Surgical History:  Procedure Laterality Date  . STOMACH SURGERY    . TRACHEOSTOMY TUBE PLACEMENT N/A 06/28/2019   Procedure: TRACHEOSTOMY - 8DCT;  Surgeon: Izora Gala, MD;  Location: [facility] OR;  Service: ENT;  Laterality: N/A;   Family History:  Family History  Problem Relation Age of Onset  . Alzheimer's disease Mother   . Depression Mother    Family  Psychiatric  History: Unknown Social History:  Social History   Substance and Sexual Activity  Alcohol Use Yes  . Alcohol/week: 6.0 standard drinks  . Types: 6 Cans of beer per week     Social History   Substance and Sexual Activity  Drug Use Yes  . Frequency: 5.0 times per week  . Types: "Crack" cocaine, Cocaine   Comment: crack/cocaine    Social History   Socioeconomic History  . Marital status: Single    Spouse name: Not on file  . Number of children: Not on file  . Years of education: Not on file  . Highest education level: Not on file  Occupational History  . Not on file  Tobacco Use  . Smoking status: Current Every Day  Smoker    Packs/day: 1.00    Types: Cigarettes  . Smokeless tobacco: Never Used  Substance and Sexual Activity  . Alcohol use: Yes    Alcohol/week: 6.0 standard drinks    Types: 6 Cans of beer per week  . Drug use: Yes    Frequency: 5.0 times per week    Types: "Crack" cocaine, Cocaine    Comment: crack/cocaine  . Sexual activity: Never  Other Topics Concern  . Not on file  Social History Narrative  . Not on file   Social Determinants of Health   Financial Resource Strain:   . Difficulty of Paying Living Expenses:   Food Insecurity:   . Worried About Charity fundraiser in the Last Year:   . Arboriculturist in the Last Year:   Transportation Needs:   . Film/video editor (Medical):   Marland Kitchen Lack of Transportation (Non-Medical):   Physical Activity:   . Days of Exercise per Week:   . Minutes of Exercise per Session:   Stress:   . Feeling of Stress :   Social Connections:   . Frequency of Communication with Friends and Family:   . Frequency of Social Gatherings with Friends and Family:   . Attends Religious Services:   . Active Member of Clubs or Organizations:   . Attends Archivist Meetings:   Marland Kitchen Marital Status:    Additional Social History:    Allergies:   Allergies  Allergen Reactions  . Lisinopril Swelling    Angioedema resulting in emergent trach    Labs:  Results for orders placed or performed during the hospital encounter of 06/27/19 (from the past 48 hour(s))  Glucose, capillary     Status: Abnormal   Collection Time: 07/17/19 12:06 PM  Result Value Ref Range   Glucose-Capillary 170 (H) 70 - 99 mg/dL    Comment: Glucose reference range applies only to samples taken after fasting for at least 8 hours.  Glucose, capillary     Status: Abnormal   Collection Time: 07/17/19  4:58 PM  Result Value Ref Range   Glucose-Capillary 127 (H) 70 - 99 mg/dL    Comment: Glucose reference range applies only to samples taken after fasting for at least 8  hours.  Glucose, capillary     Status: Abnormal   Collection Time: 07/17/19  9:08 PM  Result Value Ref Range   Glucose-Capillary 166 (H) 70 - 99 mg/dL    Comment: Glucose reference range applies only to samples taken after fasting for at least 8 hours.  Glucose, capillary     Status: Abnormal   Collection Time: 07/18/19  8:16 AM  Result Value Ref Range   Glucose-Capillary 148 (H) 70 -  99 mg/dL    Comment: Glucose reference range applies only to samples taken after fasting for at least 8 hours.  Glucose, capillary     Status: Abnormal   Collection Time: 07/18/19  1:34 PM  Result Value Ref Range   Glucose-Capillary 188 (H) 70 - 99 mg/dL    Comment: Glucose reference range applies only to samples taken after fasting for at least 8 hours.  Glucose, capillary     Status: Abnormal   Collection Time: 07/18/19  4:44 PM  Result Value Ref Range   Glucose-Capillary 114 (H) 70 - 99 mg/dL    Comment: Glucose reference range applies only to samples taken after fasting for at least 8 hours.  Glucose, capillary     Status: Abnormal   Collection Time: 07/18/19  8:53 PM  Result Value Ref Range   Glucose-Capillary 209 (H) 70 - 99 mg/dL    Comment: Glucose reference range applies only to samples taken after fasting for at least 8 hours.  Glucose, capillary     Status: Abnormal   Collection Time: 07/19/19  6:48 AM  Result Value Ref Range   Glucose-Capillary 160 (H) 70 - 99 mg/dL    Comment: Glucose reference range applies only to samples taken after fasting for at least 8 hours.  Glucose, capillary     Status: Abnormal   Collection Time: 07/19/19 11:18 AM  Result Value Ref Range   Glucose-Capillary 130 (H) 70 - 99 mg/dL    Comment: Glucose reference range applies only to samples taken after fasting for at least 8 hours.    Current Facility-Administered Medications  Medication Dose Route Frequency Provider Last Rate Last Admin  . 0.9 %  sodium chloride infusion   Intravenous PRN Tally Due, MD    Stopped at 06/28/19 1431  . amLODipine (NORVASC) tablet 5 mg  5 mg Oral Daily Wendee Beavers T, MD   5 mg at 07/19/19 1028  . chlorhexidine gluconate (MEDLINE KIT) (PERIDEX) 0.12 % solution 15 mL  15 mL Mouth Rinse BID Tally Due, MD   15 mL at 07/19/19 1038  . Chlorhexidine Gluconate Cloth 2 % PADS 6 each  6 each Topical Daily Rigoberto Noel, MD   6 each at 07/19/19 1032  . clonazePAM (KLONOPIN) tablet 0.5 mg  0.5 mg Oral TID PRN Wendee Beavers T, MD   0.5 mg at 07/16/19 2100  . enoxaparin (LOVENOX) injection 40 mg  40 mg Subcutaneous Q24H Neva Seat, MD   40 mg at 07/19/19 1031  . famotidine (PEPCID) tablet 20 mg  20 mg Oral BID Wendee Beavers T, MD   20 mg at 07/19/19 1028  . feeding supplement (ENSURE ENLIVE) (ENSURE ENLIVE) liquid 237 mL  237 mL Oral TID BM Regalado, Belkys A, MD   237 mL at 07/18/19 2042  . fentaNYL (SUBLIMAZE) injection 25 mcg  25 mcg Intravenous Q15 min PRN Corey Harold, NP   25 mcg at 06/29/19 0110  . fentaNYL (SUBLIMAZE) injection 25-100 mcg  25-100 mcg Intravenous Q30 min PRN Corey Harold, NP   100 mcg at 07/02/19 1358  . haloperidol lactate (HALDOL) injection 2 mg  2 mg Intravenous Q6H PRN Neva Seat, MD   2 mg at 07/09/19 0157  . hydrALAZINE (APRESOLINE) injection 5 mg  5 mg Intravenous Q4H PRN Neva Seat, MD      . hydrochlorothiazide (MICROZIDE) capsule 12.5 mg  12.5 mg Oral Daily Wendee Beavers T, MD   12.5 mg at 07/19/19 1028  .  insulin aspart (novoLOG) injection 0-5 Units  0-5 Units Subcutaneous QHS Mercy Riding, MD   2 Units at 07/18/19 2151  . insulin aspart (novoLOG) injection 0-9 Units  0-9 Units Subcutaneous TID WC Mercy Riding, MD   2 Units at 07/19/19 0654  . insulin glargine (LANTUS) injection 10 Units  10 Units Subcutaneous QHS Lang Snow, FNP   10 Units at 07/18/19 2150  . labetalol (NORMODYNE) tablet 100 mg  100 mg Oral BID Wendee Beavers T, MD   100 mg at 07/19/19 1028  . multivitamin with minerals tablet 1 tablet  1 tablet Oral  Daily Donne Hazel, MD   1 tablet at 07/19/19 1029  . nicotine (NICODERM CQ - dosed in mg/24 hours) patch 21 mg  21 mg Transdermal Daily Lang Snow, FNP   21 mg at 07/19/19 1035  . oxyCODONE-acetaminophen (PERCOCET/ROXICET) 5-325 MG per tablet 1 tablet  1 tablet Oral Q4H PRN Mercy Riding, MD   1 tablet at 07/15/19 1213  . paliperidone (INVEGA SUSTENNA) injection 234 mg  234 mg Intramuscular Once Donne Hazel, MD      . phenol [facility]) mouth spray 1 spray  1 spray Mouth/Throat PRN Opyd, Ilene Qua, MD   1 spray at 07/15/19 2311  . polyethylene glycol (MIRALAX / GLYCOLAX) packet 17 g  17 g Oral Daily PRN Mercy Riding, MD   17 g at 07/11/19 2028  . QUEtiapine (SEROQUEL) tablet 100 mg  100 mg Oral QHS Wendee Beavers T, MD   100 mg at 07/18/19 2150  . Resource ThickenUp Clear   Oral PRN Donne Hazel, MD      . senna-docusate (Senokot-S) tablet 1 tablet  1 tablet Oral BID PRN Mercy Riding, MD   1 tablet at 07/11/19 2029    Musculoskeletal: Strength & Muscle Tone: within normal limits Gait & Station: unable to assess Patient leans: N/A  Psychiatric Specialty Exam: Physical Exam  Nursing note and vitals reviewed. Constitutional: She is oriented to person, place, and time. She appears well-developed.  HENT:  Head: Normocephalic.  Cardiovascular: Normal rate.  Respiratory: Effort normal.  Musculoskeletal:        General: Normal range of motion.     Cervical back: Normal range of motion.  Neurological: She is alert and oriented to person, place, and time.  Psychiatric: Her speech is normal and behavior is normal. Judgment and thought content normal. Her mood appears anxious. Cognition and memory are impaired.    Review of Systems  Constitutional: Negative.   HENT: Negative.   Eyes: Negative.   Respiratory: Negative.   Cardiovascular: Negative.   Gastrointestinal: Negative.   Genitourinary: Negative.   Musculoskeletal: Negative.   Skin: Negative.   Neurological:  Negative.   Psychiatric/Behavioral: The patient is nervous/anxious.     Blood pressure (!) 146/120, pulse (!) 105, temperature 98.4 F (36.9 C), resp. rate 16, weight 108.6 kg, last menstrual period 01/13/2012, SpO2 98 %.Body mass index is 37.5 kg/m.  General Appearance: Casual and Fairly Groomed  Eye Contact:  Good  Speech:  Clear and Coherent and Pressured  Volume:  Normal  Mood:  Anxious  Affect:  Congruent  Thought Process:  Coherent, Goal Directed and Descriptions of Associations: Tangential  Orientation:  Full (Time, Place, and Person)  Thought Content:  Logical  Suicidal Thoughts:  Denies  Homicidal Thoughts:  denies  Memory:  Immediate;   Good Recent;   Fair Remote;   Fair  Judgement:  Fair  Insight:  Present  Psychomotor Activity:  Normal  Concentration:  Concentration: Good and Attention Span: Good  Recall:  Good  Fund of Knowledge:  Good  Language:  Good  Akathisia:  No  Handed:  Right  AIMS (if indicated):     Assets:  Communication Skills Desire for Improvement Financial Resources/Insurance Housing Intimacy Leisure Time Physical Health Resilience Social Support  ADL's:  Impaired  Cognition:  WNL  Sleep:        Treatment Plan Summary: Patient discussed with Dr Dwyane Dee.   1.   At the time of the evaluation patient does appear to that have the ability to understand her condition " I was given the wrong medication and it messed me up bad. They said I couldn't breathe . "; the ability to appreciate the consequences of low oxygenation, declining additional rehab services and treatment options. " If I get short of breath I will call 911. I ran up and down that hall yesterday like I ran track in high school. You can ask the nurses. I dont need any rehab."  Patient does possess sufficient understanding and reasoning ability to make some decisions, such as going home. Patient understands and agrees that she can receive services at home and does not need any additional  stay or rehab in order to receive the care listed above.  Patient does appear to fully understand the risk of refusing treatment at this time. As it maybe patient does have the ability to make some medical decisions to include discharging and resuming home health services.   Mental Capacity Assessment: I have evaluated the following areas to assess the [name] mental capacity regarding medical decision-making ability which pertains to competency to accept or refuse medical treatment.   The specific treatment or service in question is: nursing home placement.   Communication: The patient was (able) to clearly state preferred treatment options for her medical care at this time. The patient was able to decide that she does want pt and ot services, and wants them at home. Factors that could compromise this communication process include: schizophrenia and transition of care services.   Understanding: The patient was able to recall information, link causal relationships, and process general probabilities regarding life situations and medical treatment scenarios. "Mam please dont talk like that. If something were to happen while I was at home. I pray that nothing happens while Im home. I pray that I dont have trouble breathing but if it does happen I will call 911. We are not going to talk about those things, cause god has me covered. "  She was able to paraphrase her view of the current situation and her thoughts about it including home health vs rehab facility . The patient did present with impairments in memory, attention span, or intelligence.   Appreciation: The patient was able to identify and describe her various illnesses and treatment options with potential outcomes (low oxygenation, trach dressing change, medication management) . The patient did present with concerns such as denial or delusional thought-process.   Rationalization: The patient was able to weigh risks and benefits and come to a  conclusion congruent with patient's perceived goals. Concerns regarding this category are: depression, psychotic thought disorder,and acute/chronic encephalopathy. In conclusion, the patient is not experiencing an acute medical scenario: recent airway compromise 2/t angioedema.    Conclusion: At this time, there is sufficient evidence to warrant removal of the patient's rights for medical decision-making.  At this time, we can determine that  the patient does have functional mental capacity for medical decision-making including the right to accept or refuse specific medical treatment. Please consult with psychiatry if necessary, after resolving underlying transient medical concerns.    2. Medication management- Continue current Seroquel 100 mg nightly.  Patient to continue receiving services via her ACT team ([facility]) at discharge. She has already spoke with them to coordinate her care. Please have social work continue to coordinate these services and transition of care to appropriate services.    Disposition: No evidence of imminent risk to self or others at present.   Patient does not meet criteria for psychiatric inpatient admission. Supportive therapy provided about ongoing stressors.  Mary Broad, FNP 07/19/2019 11:24 AM

## 2019-07-19 NOTE — TOC Progression Note (Addendum)
Transition of Care [facility]) - Progression Note    Patient Details  Name: [name] MRN: [id number] Date of Birth: 12-16-1960  Transition [facility]) CM/SW [place], [place] Phone Number: 07/19/2019, 12:32 PM  Clinical Narrative:    CSW informed my MD that patient has some capacity and understanding. Patient refusing SNF.  CSW met bedside with patient to discuss PT recommendation for short-term stay at [facility]. Patient expressed she does not want to go to a facility and that she is able to walk & run. Patient states she resides at [address] in [place]. Patient states her [facility] team also visits her at the residence.  CSW spoke with patient's daughter [name]. Daughter stated patient does not have anyone who could assist with 24hr supervision. Daughter states she went to [place] to inquire on guardianship and was told she needs to go to [facility], which she is unable to do until Monday. Daughter requested to be kept updated.  CSW reached out to patient's [facility] multiple times. CSW unable to leave a voicemail. CSW contacted [facility] agency directly. Agency agreed to send an e-mail to patient's SW for follow-up. TOC team will continue to follow.  1:25p Following [place] agencies contacted:  [facility]: Unable to provide PT [facility]: Left voicemail [facility]: Unable to accept patient due to staffing [facility]: Unable to accept patient [facility]: Able to accept patient, unable to provided respiratory care.  DME confirmed with [facility], being delivered to room.  Expected Discharge Plan: Skilled Nursing Facility Barriers to Discharge: [place] (PASRR), Continued Medical Work up, Other (comment)(new trach, placed 4/16; needs to be 63 days old prior to SNF placement)  Expected Discharge Plan and Services Expected Discharge Plan: [place] Choice:  [place] arrangements for the past 2 months: Group Home                                       Social Determinants of Health (SDOH) Interventions    Readmission Risk Interventions No flowsheet data found.

## 2019-07-19 NOTE — Discharge Summary (Signed)
Physician Discharge Summary  Azalea Cedar ALP:[id number] DOB: 12-20-1960 DOA: 06/27/2019  PCP: [facility]  Admit date: 06/27/2019 Discharge date: 07/19/2019  Admitted From:  Home  Disposition: Home   Recommendations for Outpatient Follow-up:  1. Follow up with PCP in 1-2 weeks 2. Please obtain BMP/CBC in one week 3. Please follow up on the following pending results: follow up with pulmonary for care of stoma post trach  Home Health: yes.   Discharge Condition: stable CODE STATUS: full code Diet recommendation: Heart Healthy  Brief/Interim Summary: 59 year old with past medical history significant for bipolar type I, hypertension, schizoaffective disorder, seizure who presents with angioedema present from ACE inhibitor.  Patient required emergent tracheostomy and admitted to ICU.  Subsequently placed on trach collar and transferred to [facility] service on 4/22.  Patient has not been compliant with care and refusing CBG and blood drawn.  She is on dysphagia 3 diet. Psych consulted for assistance with psychiatric medication capacity assessment.  She was deemed to have no capacity due to poor insight into her situation. Therapy recommending skilled nursing facility. PCCM following for trach care.  Patient was started capping trach on 5/3.  Patient underwent decannulation on 5\5.  1-Acute hypoxic respiratory failure due to upper airway compromise from angioedema: -Angioedema thought to be secondary to ACE inhibitors or cocaine. -UDS positive for cocaine and benzos -Lisinopril was added to allergy list -PCCM needs emergent tracheostomy on 4/15 >>> #4 cough last on 4/29>>> capping trial on 5\3. -Decannulation on 5/5 -Physical therapy is recommending a skilled nursing facility, patient wants to go home but she has poor insight and deemed to lack capacity per psych evaluation. Stable post decannulation. Needs to follow up with CCM office 5/21. She will need PSG  Stable.    2-New diagnosis of diabetes with hyperglycemia: Hemoglobin A1c 7.8 Hyperglycemia improve after stopping steroid Discharge on metformin.  Statins.  3-Hypertension:  Lisinopril discontinued due to angioedema Continue with labetalol and hydrochlorothiazide, norvasc.    4-schizoaffective disorder/bipolar disorder Patient irritable at times. --Per report, [facility] D/C July 2020 was taking: Cogentin 0.5mg , Quetiapine 100mg , Depakote 500mg , Topiramate 50mg , and Invega sustenna (injection).Pharmacy reports she has only filled Haldol -Continue with quetiapine, as needed Haldol and Klonopin On Invega every 30 days to admit Will provide prescription for Seroquel.   5-history of seizure:  Patient was on Depakote for mood stabilization, last taking in 2017  6-Noncompliance with care, refused blood drawn and meds at time Evaluated by Psych again per patient request on 5/072021 and patient was found to have capacity to make her own medical decision. Patient will be discharge home with [facility] ([facility]), her ACT team and social work was informed.  Family was informed about importance of patient not taking lisinopril due to allergic reaction. They need to remove old medications from home.   7-Morbid obesity, body mass index 37 Encouraged lifestyle changes.   Discharge Diagnoses:  Principal Problem:   Acute respiratory failure (HCC) Active Problems:   Schizoaffective disorder (HCC)   Angioedema   Tracheostomy status (HCC)   Airway compromise    Discharge Instructions  Discharge Instructions    Diet - low sodium heart healthy   Complete by: As directed    Increase activity slowly   Complete by: As directed      Allergies as of 07/19/2019      Reactions   Lisinopril Swelling   Angioedema resulting in emergent trach      Medication List    STOP taking  these medications   cephALEXin 500 MG capsule Commonly known as: Keflex   divalproex 500 MG 24 hr tablet Commonly known as:  DEPAKOTE ER   Ingrezza 40 MG Caps Generic drug: Valbenazine Tosylate   potassium chloride 10 MEQ tablet Commonly known as: KLOR-CON   risperiDONE 2 MG tablet Commonly known as: RISPERDAL   topiramate 50 MG tablet Commonly known as: TOPAMAX   traZODone 50 MG tablet Commonly known as: DESYREL     TAKE these medications   amLODipine 5 MG tablet Commonly known as: NORVASC Take 1 tablet (5 mg total) by mouth daily. Start taking on: Jul 20, 2019   famotidine 20 MG tablet Commonly known as: PEPCID Take 1 tablet (20 mg total) by mouth 2 (two) times daily.   hydrochlorothiazide 12.5 MG capsule Commonly known as: MICROZIDE Take 1 capsule (12.5 mg total) by mouth daily.   Hinda Glatter Sustenna 234 MG/1.5ML Susp injection Generic drug: paliperidone Inject 234 mg into the muscle every 30 (thirty) days. What changed: Another medication with the same name was removed. Continue taking this medication, and follow the directions you see here.   labetalol 100 MG tablet Commonly known as: NORMODYNE Take 1 tablet (100 mg total) by mouth 2 (two) times daily.   metFORMIN 500 MG tablet Commonly known as: Glucophage Take 1 tablet (500 mg total) by mouth 2 (two) times daily with a meal.   nicotine 21 mg/24hr patch Commonly known as: NICODERM CQ - dosed in mg/24 hours Place 1 patch (21 mg total) onto the skin daily. Start taking on: Jul 20, 2019   QUEtiapine 100 MG tablet Commonly known as: SEROQUEL Take 1 tablet (100 mg total) by mouth at bedtime.            Durable Medical Equipment  (From admission, onward)         Start     Ordered   07/19/19 1307  For home use only DME 3 n 1  Once     07/19/19 1306   07/19/19 1306  For home use only DME Walker  Once    Question:  Patient needs a walker to treat with the following condition  Answer:  Gait instability   07/19/19 1306         Follow-up Information    Coral Ceo, NP Follow up on 08/02/2019.   Specialty: Pulmonary  Disease Why: Your appointment is at 2:30PM Contact information: [address] [place] [address] [phone]        [facility] Follow up in 1 week(s).   Specialty: Family Medicine Contact information: [address] [place] [address] [phone]          Allergies  Allergen Reactions  . Lisinopril Swelling    Angioedema resulting in emergent trach    Consultations:  Psych    Procedures/Studies: DG Chest Port 1 View  Result Date: 06/29/2019 CLINICAL DATA:  Acute respiratory failure. EXAM: PORTABLE CHEST 1 VIEW COMPARISON:  June 28, 2019. FINDINGS: Stable cardiomegaly. Tracheostomy and feeding tubes are unchanged in position. No pneumothorax is noted. Mild bibasilar subsegmental atelectasis is noted. Bony thorax is unremarkable. IMPRESSION: Stable support apparatus. Mild bibasilar subsegmental atelectasis. Electronically Signed   By: Lupita Raider M.D.   On: 06/29/2019 08:49   DG CHEST PORT 1 VIEW  Result Date: 06/28/2019 CLINICAL DATA:  Tracheostomy EXAM: PORTABLE CHEST 1 VIEW COMPARISON:  08/15/2018 FINDINGS: Interim placement of tracheostomy, tip of the tube is about  5.3 cm superior to the carina and projects at the level of mid clavicles. There are low lung volumes. There is consolidation in the left greater than right lung base. Possible trace right effusion. Enlarged cardiomediastinal silhouette. IMPRESSION: 1. Tracheostomy tube placement as above 2. Low lung volumes with consolidation at the left greater than right lung base, atelectasis versus pneumonia 3. Cardiomegaly Electronically Signed   By: Jasmine Pang M.D.   On: 06/28/2019 02:36   DG Swallowing Func-Speech Pathology  Result Date: 07/09/2019 Objective Swallowing Evaluation: Type of Study: MBS-Modified Barium Swallow Study  Patient Details Name: Masaye Gatchalian MRN: [id number] Date of Birth: 1960-04-06 Today's Date: 07/09/2019 Time: SLP Start Time (ACUTE ONLY): 1150  -SLP Stop Time (ACUTE ONLY): 1209 SLP Time Calculation (min) (ACUTE ONLY): 19 min Past Medical History: Past Medical History: Diagnosis Date . Arthritis  . Bipolar 1 disorder (HCC)  . Hypertension  . Obesity  . Psychiatric illness  . Schizo-affective schizophrenia (HCC)  . Seizures (HCC)  . Substance abuse [facility])  Past Surgical History: Past Surgical History: Procedure Laterality Date . STOMACH SURGERY   . TRACHEOSTOMY TUBE PLACEMENT N/A 06/28/2019  Procedure: TRACHEOSTOMY - 8DCT;  Surgeon: Serena Colonel, MD;  Location: [facility] OR;  Service: ENT;  Laterality: N/A; HPI: 59 year old woman who presented with angioedema and airway compromise secondary to ACE inhibitor use. The decision was made to intubate the patient for airway protection and anesthesia was called for difficult airway. Unfortunately the anesthesiologist was unable to intubate the patient and she was taken emergently to OR for tracheostomy. PMH is significant for HTN, schizoaffective disorder, bipolar disorder, seizures, and (cocaine) substance abuse.  No data recorded Assessment / Plan / Recommendation CHL IP CLINICAL IMPRESSIONS 07/09/2019 Clinical Impression Pt not currently tolerating PMV therefore MBS completed without. She demonstrateed mild oropharyngeal dysphagia marked by prolonged mastication with solid (endentulous) and minimal lingual residue. She did not aspirate however thin, nectar and honey thick penetrated laryngeal vestibule. Majority occured before the swallow due to delayed initiation of mechanisms to protect airway and penetrated but completely exited vestibule. No remarkable pharyngeal residue. She continues to have improved but continued upper airway edema and recommend thickening liquids to nectar consistency, puree (Dys 1) texture, crush meds and full supervision for strategies, safety due to impulsivity. Do not place speaking valve with meals.     SLP Visit Diagnosis Dysphagia, oropharyngeal phase (R13.12) Attention and concentration  deficit following -- Frontal lobe and executive function deficit following -- Impact on safety and function Mild aspiration risk;Moderate aspiration risk   CHL IP TREATMENT RECOMMENDATION 07/09/2019 Treatment Recommendations Therapy as outlined in treatment plan below   Prognosis 07/09/2019 Prognosis for Safe Diet Advancement (No Data) Barriers to Reach Goals -- Barriers/Prognosis Comment -- CHL IP DIET RECOMMENDATION 07/09/2019 SLP Diet Recommendations Dysphagia 1 (Puree) solids;Nectar thick liquid Liquid Administration via Cup;No straw Medication Administration Crushed with puree Compensations Minimize environmental distractions;Slow rate;Small sips/bites;Lingual sweep for clearance of pocketing Postural Changes Seated upright at 90 degrees   CHL IP OTHER RECOMMENDATIONS 07/09/2019 Recommended Consults -- Oral Care Recommendations Oral care BID Other Recommendations --   CHL IP FOLLOW UP RECOMMENDATIONS 07/09/2019 Follow up Recommendations 24 hour supervision/assistance   CHL IP FREQUENCY AND DURATION 07/09/2019 Speech Therapy Frequency (ACUTE ONLY) min 2x/week Treatment Duration 2 weeks      CHL IP ORAL PHASE 07/09/2019 Oral Phase Impaired Oral - Pudding Teaspoon -- Oral - Pudding Cup -- Oral - Honey Teaspoon -- Oral - Honey Cup Lingual/palatal residue Oral - Nectar Teaspoon  WFL Oral - Nectar Cup WFL Oral - Nectar Straw WFL Oral - Thin Teaspoon Decreased bolus cohesion Oral - Thin Cup -- Oral - Thin Straw -- Oral - Puree WFL Oral - Mech Soft -- Oral - Regular Delayed oral transit Oral - Multi-Consistency -- Oral - Pill -- Oral Phase - Comment --  CHL IP PHARYNGEAL PHASE 07/09/2019 Pharyngeal Phase Impaired Pharyngeal- Pudding Teaspoon -- Pharyngeal -- Pharyngeal- Pudding Cup -- Pharyngeal -- Pharyngeal- Honey Teaspoon -- Pharyngeal -- Pharyngeal- Honey Cup Penetration/Aspiration during swallow Pharyngeal Material enters airway, remains ABOVE vocal cords then ejected out Pharyngeal- Nectar Teaspoon Penetration/Aspiration  before swallow Pharyngeal Material enters airway, remains ABOVE vocal cords then ejected out Pharyngeal- Nectar Cup Delayed swallow initiation-vallecula;Penetration/Aspiration before swallow Pharyngeal Material enters airway, remains ABOVE vocal cords then ejected out Pharyngeal- Nectar Straw WFL Pharyngeal -- Pharyngeal- Thin Teaspoon Penetration/Aspiration before swallow Pharyngeal Material enters airway, remains ABOVE vocal cords then ejected out Pharyngeal- Thin Cup Penetration/Aspiration during swallow Pharyngeal Material enters airway, remains ABOVE vocal cords then ejected out Pharyngeal- Thin Straw -- Pharyngeal -- Pharyngeal- Puree WFL Pharyngeal -- Pharyngeal- Mechanical Soft -- Pharyngeal -- Pharyngeal- Regular -- Pharyngeal -- Pharyngeal- Multi-consistency -- Pharyngeal -- Pharyngeal- Pill -- Pharyngeal -- Pharyngeal Comment --  CHL IP CERVICAL ESOPHAGEAL PHASE 07/09/2019 Cervical Esophageal Phase WFL Pudding Teaspoon -- Pudding Cup -- Honey Teaspoon -- Honey Cup -- Nectar Teaspoon -- Nectar Cup -- Nectar Straw -- Thin Teaspoon -- Thin Cup -- Thin Straw -- Puree -- Mechanical Soft -- Regular -- Multi-consistency -- Pill -- Cervical Esophageal Comment -- Royce MacadamiaLitaker, Lisa Willis 07/09/2019, 2:23 PM  Breck CoonsLisa Willis Litaker M.Ed Nurse, children'sCCC-SLP Speech-Language Pathologist Pager [phone] Office [phone]             (Echo, Carotid, EGD, Colonoscopy, ERCP)    Subjective:   Discharge Exam: Vitals:   07/19/19 0759 07/19/19 1539  BP: (!) 146/120 (!) 144/94  Pulse: (!) 105 96  Resp: 16 16  Temp: 98.4 F (36.9 C) 98.2 F (36.8 C)  SpO2: 98% 97%     General: Pt is alert, awake, not in acute distress Cardiovascular: RRR, S1/S2 +, no rubs, no gallops Respiratory: CTA bilaterally, no wheezing, no rhonchi Abdominal: Soft, NT, ND, bowel sounds + Extremities: no edema, no cyanosis    The results of significant diagnostics from this hospitalization (including imaging, microbiology, ancillary and laboratory) are  listed below for reference.     Microbiology: No results found for this or any previous visit (from the past 240 hour(s)).   Labs: BNP (last 3 results) No results for input(s): BNP in the last 8760 hours. Basic Metabolic Panel: Recent Labs  Lab 07/17/19 0816  NA 140  K 4.3  CL 100  CO2 27  GLUCOSE 156*  BUN 8  CREATININE 0.64  CALCIUM 9.6   Liver Function Tests: No results for input(s): AST, ALT, ALKPHOS, BILITOT, PROT, ALBUMIN in the last 168 hours. No results for input(s): LIPASE, AMYLASE in the last 168 hours. No results for input(s): AMMONIA in the last 168 hours. CBC: Recent Labs  Lab 07/17/19 0816  WBC 5.5  HGB 13.5  HCT 42.5  MCV 90.8  PLT 432*   Cardiac Enzymes: No results for input(s): CKTOTAL, CKMB, CKMBINDEX, TROPONINI in the last 168 hours. BNP: Invalid input(s): POCBNP CBG: Recent Labs  Lab 07/18/19 1334 07/18/19 1644 07/18/19 2053 07/19/19 0648 07/19/19 1118  GLUCAP 188* 114* 209* 160* 130*   D-Dimer No results for input(s): DDIMER in the last 72 hours. Hgb A1c  No results for input(s): HGBA1C in the last 72 hours. Lipid Profile No results for input(s): CHOL, HDL, LDLCALC, TRIG, CHOLHDL, LDLDIRECT in the last 72 hours. Thyroid function studies No results for input(s): TSH, T4TOTAL, T3FREE, THYROIDAB in the last 72 hours.  Invalid input(s): FREET3 Anemia work up No results for input(s): VITAMINB12, FOLATE, FERRITIN, TIBC, IRON, RETICCTPCT in the last 72 hours. Urinalysis    Component Value Date/Time   COLORURINE YELLOW 06/21/2019 0625   APPEARANCEUR CLEAR 06/21/2019 0625   LABSPEC 1.011 06/21/2019 0625   PHURINE 5.0 06/21/2019 0625   GLUCOSEU NEGATIVE 06/21/2019 0625   HGBUR SMALL (A) 06/21/2019 0625   BILIRUBINUR NEGATIVE 06/21/2019 0625   KETONESUR NEGATIVE 06/21/2019 0625   PROTEINUR NEGATIVE 06/21/2019 0625   UROBILINOGEN 0.2 12/08/2014 1937   NITRITE NEGATIVE 06/21/2019 0625   LEUKOCYTESUR SMALL (A) 06/21/2019 0625    Sepsis Labs Invalid input(s): PROCALCITONIN,  WBC,  LACTICIDVEN Microbiology No results found for this or any previous visit (from the past 240 hour(s)).   Time coordinating discharge: 40 minutes  SIGNED:   Elmarie Shiley, MD  [facility]

## 2019-07-19 NOTE — Discharge Instructions (Signed)
You are allergic to Lisinopril, you develop angioedema.

## 2019-08-02 ENCOUNTER — Inpatient Hospital Stay: Payer: Medicare Other | Admitting: Pulmonary Disease

## 2019-08-02 NOTE — Progress Notes (Deleted)
@Patient  ID: , female    DOB: [birth year], 59 y.o.   MRN: [id number]  No chief complaint on file.   Referring provider: [facility], [facility]  HPI:   PMH:  Smoker/ Smoking History:  Maintenance:   Pt of:   08/02/2019  - Visit   59 year old female current smoker consulted with our practice while inpatient on consulted when inpatient on 07/05/2019.  Patient required tracheostomy and was decannulated on 07/18/2019.  Patient was discharged on 07/19/2019.  Original admission date was on 06/27/2019.  Patient was requested to follow-up with primary care as well as pulmonary for care of the stoma post trach.  Summary of patient's discharge listed below:  Brief/Interim Summary: 59 year old with past medical history significant for bipolar type I, hypertension, schizoaffective disorder, seizure who presents with angioedema present from ACE inhibitor. Patient required emergent tracheostomy and admitted to ICU. Subsequently placed on trach collar and transferred to [facility] service on 4/22. Patient has not been compliant with care and refusing CBG and blood drawn. She is on dysphagia 3 diet. Psych consulted for assistance with psychiatric medication capacity assessment. She was deemed to have no capacity due to poor insight into her situation. Therapy recommending skilled nursing facility. PCCM following for trach care. Patient was started capping trach on 5/3. Patient underwent decannulation on 5\5.  1-Acute hypoxic respiratory failure due to upper airway compromise from angioedema: -Angioedema thought to be secondary to ACE inhibitors or cocaine. -UDS positive for cocaine and benzos -Lisinopril was added to allergy list -PCCM needs emergent tracheostomy on 4/15 >>> #4 cough last on 4/29>>> capping trial on 5\3. -Decannulation on 5/5 -Physical therapy is recommending a skilled nursing facility, patient wants to go home but she has poor insight and deemed to lack capacity per psych  evaluation. Stable post decannulation. Needs to follow up with CCM office 5/21. She will need PSG Stable.   2-New diagnosis of diabetes with hyperglycemia: Hemoglobin A1c 7.8 Hyperglycemia improve after stopping steroid Discharge on metformin.  Statins.  3-Hypertension:  Lisinopril discontinued due to angioedema Continue with labetalol and hydrochlorothiazide, norvasc.    4-schizoaffective disorder/bipolar disorder Patient irritable at times. --Per report, [facility] July 2020 was taking: Cogentin 0.5mg , Quetiapine 100mg , Depakote 500mg , Topiramate 50mg , and Invega sustenna (injection).Pharmacy reports she has only filled Haldol -Continue with quetiapine, as needed Haldol and Klonopin On Invega every 30 days to admit Will provide prescription for Seroquel.   5-history of seizure: Patient was on Depakote for mood stabilization, last taking in 2017  6-Noncompliance with care, refused blood drawn and meds at time Evaluated by Psych again per patient request on 5/072021 and patient was found to have capacity to make her own medical decision. Patient will be discharge home with [facility], her ACT team and social work was informed.  Family was informed about importance of patient not taking lisinopril due to allergic reaction. They need to remove old medications from home.   7-Morbid obesity, body mass index 37 Encouraged lifestyle changes.     Questionaires / Pulmonary Flowsheets:   ACT:  No flowsheet data found.  MMRC: No flowsheet data found.  Epworth:  No flowsheet data found.  Tests:     FENO:  No results found for: NITRICOXIDE  PFT: No flowsheet data found.  WALK:  No flowsheet data found.  Imaging: DG Swallowing Func-Speech Pathology  Result Date: 07/09/2019 Objective Swallowing Evaluation: Type of Study: MBS-Modified Barium Swallow Study  Patient Details Name: [name] MRN: [id number] Date of Birth:  1961-03-13 Today's Date: 07/09/2019 Time: SLP  Start Time (ACUTE ONLY): 1150 -SLP Stop Time (ACUTE ONLY): 1209 SLP Time Calculation (min) (ACUTE ONLY): 19 min Past Medical History: Past Medical History: Diagnosis Date . Arthritis  . Bipolar 1 disorder (HCC)  . Hypertension  . Obesity  . Psychiatric illness  . Schizo-affective schizophrenia (HCC)  . Seizures (HCC)  . Substance abuse [facility])  Past Surgical History: Past Surgical History: Procedure Laterality Date . STOMACH SURGERY   . TRACHEOSTOMY TUBE PLACEMENT N/A 06/28/2019  Procedure: TRACHEOSTOMY - 8DCT;  Surgeon: Serena Colonel, MD;  Location: [facility];  Service: ENT;  Laterality: N/A; HPI: 59 year old woman who presented with angioedema and airway compromise secondary to ACE inhibitor use. The decision was made to intubate the patient for airway protection and anesthesia was called for difficult airway. Unfortunately the anesthesiologist was unable to intubate the patient and she was taken emergently to OR for tracheostomy. PMH is significant for HTN, schizoaffective disorder, bipolar disorder, seizures, and (cocaine) substance abuse.  No data recorded Assessment / Plan / Recommendation CHL IP CLINICAL IMPRESSIONS 07/09/2019 Clinical Impression Pt not currently tolerating PMV therefore MBS completed without. She demonstrateed mild oropharyngeal dysphagia marked by prolonged mastication with solid (endentulous) and minimal lingual residue. She did not aspirate however thin, nectar and honey thick penetrated laryngeal vestibule. Majority occured before the swallow due to delayed initiation of mechanisms to protect airway and penetrated but completely exited vestibule. No remarkable pharyngeal residue. She continues to have improved but continued upper airway edema and recommend thickening liquids to nectar consistency, puree (Dys 1) texture, crush meds and full supervision for strategies, safety due to impulsivity. Do not place speaking valve with meals.     SLP Visit Diagnosis Dysphagia, oropharyngeal phase (R13.12)  Attention and concentration deficit following -- Frontal lobe and executive function deficit following -- Impact on safety and function Mild aspiration risk;Moderate aspiration risk   CHL IP TREATMENT RECOMMENDATION 07/09/2019 Treatment Recommendations Therapy as outlined in treatment plan below   Prognosis 07/09/2019 Prognosis for Safe Diet Advancement (No Data) Barriers to Reach Goals -- Barriers/Prognosis Comment -- CHL IP DIET RECOMMENDATION 07/09/2019 SLP Diet Recommendations Dysphagia 1 (Puree) solids;Nectar thick liquid Liquid Administration via Cup;No straw Medication Administration Crushed with puree Compensations Minimize environmental distractions;Slow rate;Small sips/bites;Lingual sweep for clearance of pocketing Postural Changes Seated upright at 90 degrees   CHL IP OTHER RECOMMENDATIONS 07/09/2019 Recommended Consults -- Oral Care Recommendations Oral care BID Other Recommendations --   CHL IP FOLLOW UP RECOMMENDATIONS 07/09/2019 Follow up Recommendations 24 hour supervision/assistance   CHL IP FREQUENCY AND DURATION 07/09/2019 Speech Therapy Frequency (ACUTE ONLY) min 2x/week Treatment Duration 2 weeks      CHL IP ORAL PHASE 07/09/2019 Oral Phase Impaired Oral - Pudding Teaspoon -- Oral - Pudding Cup -- Oral - Honey Teaspoon -- Oral - Honey Cup Lingual/palatal residue Oral - Nectar Teaspoon WFL Oral - Nectar Cup WFL Oral - Nectar Straw WFL Oral - Thin Teaspoon Decreased bolus cohesion Oral - Thin Cup -- Oral - Thin Straw -- Oral - Puree WFL Oral - Mech Soft -- Oral - Regular Delayed oral transit Oral - Multi-Consistency -- Oral - Pill -- Oral Phase - Comment --  CHL IP PHARYNGEAL PHASE 07/09/2019 Pharyngeal Phase Impaired Pharyngeal- Pudding Teaspoon -- Pharyngeal -- Pharyngeal- Pudding Cup -- Pharyngeal -- Pharyngeal- Honey Teaspoon -- Pharyngeal -- Pharyngeal- Honey Cup Penetration/Aspiration during swallow Pharyngeal Material enters airway, remains ABOVE vocal cords then ejected out Pharyngeal- Nectar  Teaspoon Penetration/Aspiration before swallow Pharyngeal  Material enters airway, remains ABOVE vocal cords then ejected out Pharyngeal- Nectar Cup Delayed swallow initiation-vallecula;Penetration/Aspiration before swallow Pharyngeal Material enters airway, remains ABOVE vocal cords then ejected out Pharyngeal- Nectar Straw WFL Pharyngeal -- Pharyngeal- Thin Teaspoon Penetration/Aspiration before swallow Pharyngeal Material enters airway, remains ABOVE vocal cords then ejected out Pharyngeal- Thin Cup Penetration/Aspiration during swallow Pharyngeal Material enters airway, remains ABOVE vocal cords then ejected out Pharyngeal- Thin Straw -- Pharyngeal -- Pharyngeal- Puree WFL Pharyngeal -- Pharyngeal- Mechanical Soft -- Pharyngeal -- Pharyngeal- Regular -- Pharyngeal -- Pharyngeal- Multi-consistency -- Pharyngeal -- Pharyngeal- Pill -- Pharyngeal -- Pharyngeal Comment --  CHL IP CERVICAL ESOPHAGEAL PHASE 07/09/2019 Cervical Esophageal Phase WFL Pudding Teaspoon -- Pudding Cup -- Honey Teaspoon -- Honey Cup -- Nectar Teaspoon -- Nectar Cup -- Nectar Straw -- Thin Teaspoon -- Thin Cup -- Thin Straw -- Puree -- Mechanical Soft -- Regular -- Multi-consistency -- Pill -- Cervical Esophageal Comment -- [place] Siren 07/09/2019, 2:23 PM  Orbie Pyo Litaker M.Ed CCC-SLP Speech-Language Pathologist Pager [phone] Office [phone]              Lab Results:  CBC    Component Value Date/Time   WBC 5.5 07/17/2019 0816   RBC 4.68 07/17/2019 0816   HGB 13.5 07/17/2019 0816   HCT 42.5 07/17/2019 0816   PLT 432 (H) 07/17/2019 0816   MCV 90.8 07/17/2019 0816   MCH 28.8 07/17/2019 0816   MCHC 31.8 07/17/2019 0816   RDW 13.2 07/17/2019 0816   LYMPHSABS 2.8 06/27/2019 2242   MONOABS 0.9 06/27/2019 2242   EOSABS 0.2 06/27/2019 2242   BASOSABS 0.1 06/27/2019 2242    BMET    Component Value Date/Time   NA 140 07/17/2019 0816   K 4.3 07/17/2019 0816   CL 100 07/17/2019 0816   CO2 27 07/17/2019 0816    GLUCOSE 156 (H) 07/17/2019 0816   BUN 8 07/17/2019 0816   CREATININE 0.64 07/17/2019 0816   CALCIUM 9.6 07/17/2019 0816   GFRNONAA >60 07/17/2019 0816   GFRAA >60 07/17/2019 0816    BNP    Component Value Date/Time   BNP 32.9 04/18/2014 2008    ProBNP No results found for: PROBNP  Specialty Problems      Pulmonary Problems   Acute respiratory failure (HCC)   Airway compromise      Allergies  Allergen Reactions  . Lisinopril Swelling    Angioedema resulting in emergent trach     There is no immunization history on file for this patient.  Past Medical History:  Diagnosis Date  . Arthritis   . Bipolar 1 disorder ([place])   . Hypertension   . Obesity   . Psychiatric illness   . Schizo-affective schizophrenia ([place])   . Seizures ([place])   . Substance abuse ([place])     Tobacco History: Social History   Tobacco Use  Smoking Status Current Every Day Smoker  . Packs/day: 1.00  . Types: Cigarettes  Smokeless Tobacco Never Used   Ready to quit: Not Answered Counseling given: Not Answered   Continue to not smoke  Outpatient Encounter Medications as of 08/02/2019  Medication Sig  . amLODipine (NORVASC) 5 MG tablet Take 1 tablet (5 mg total) by mouth daily.  . famotidine (PEPCID) 20 MG tablet Take 1 tablet (20 mg total) by mouth 2 (two) times daily.  . hydrochlorothiazide (MICROZIDE) 12.5 MG capsule Take 1 capsule (12.5 mg total) by mouth daily.  Marland Kitchen labetalol (NORMODYNE) 100 MG tablet Take 1 tablet (100  mg total) by mouth 2 (two) times daily.  . metFORMIN (GLUCOPHAGE) 500 MG tablet Take 1 tablet (500 mg total) by mouth 2 (two) times daily with a meal.  . nicotine (NICODERM CQ - DOSED IN MG/24 HOURS) 21 mg/24hr patch Place 1 patch (21 mg total) onto the skin daily.  . paliperidone (INVEGA SUSTENNA) 234 MG/1.5ML SUSP injection Inject 234 mg into the muscle every 30 (thirty) days.  Marland Kitchen QUEtiapine (SEROQUEL) 100 MG tablet Take 1 tablet (100 mg total) by mouth at bedtime.    No facility-administered encounter medications on file as of 08/02/2019.     Review of Systems  Review of Systems   Physical Exam  LMP 01/13/2012   Wt Readings from Last 5 Encounters:  07/18/19 239 lb 6.7 oz (108.6 kg)  06/21/19 286 lb 9.6 oz (130 kg)  04/30/19 300 lb (136.1 kg)  12/16/18 200 lb 9.9 oz (91 kg)  11/18/18 200 lb 9.9 oz (91 kg)    BMI Readings from Last 5 Encounters:  07/18/19 37.50 kg/m  06/21/19 44.89 kg/m  04/30/19 46.99 kg/m  12/16/18 31.42 kg/m  11/18/18 31.42 kg/m     Physical Exam    Assessment & Plan:   No problem-specific Assessment & Plan notes found for this encounter.    No follow-ups on file.   Coral Ceo, NP 08/02/2019   This appointment required *** minutes of patient care (this includes precharting, chart review, review of results, face-to-face care, etc.).

## 2019-08-16 ENCOUNTER — Inpatient Hospital Stay: Payer: Medicare Other | Admitting: Adult Health

## 2019-11-16 ENCOUNTER — Encounter (HOSPITAL_COMMUNITY): Payer: Self-pay

## 2019-11-16 ENCOUNTER — Emergency Department (HOSPITAL_COMMUNITY)
Admission: EM | Admit: 2019-11-16 | Discharge: 2019-11-16 | Disposition: A | Discharge status: Home / Self Care | Payer: Medicare Other | Attending: Emergency Medicine | Admitting: Emergency Medicine

## 2019-11-16 ENCOUNTER — Other Ambulatory Visit: Payer: Self-pay

## 2019-11-16 DIAGNOSIS — F10129 Alcohol abuse with intoxication, unspecified: Secondary | ICD-10-CM | POA: Insufficient documentation

## 2019-11-16 DIAGNOSIS — F1910 Other psychoactive substance abuse, uncomplicated: Secondary | ICD-10-CM | POA: Insufficient documentation

## 2019-11-16 DIAGNOSIS — Z5321 Procedure and treatment not carried out due to patient leaving prior to being seen by health care provider: Secondary | ICD-10-CM | POA: Insufficient documentation

## 2019-11-16 DIAGNOSIS — I10 Essential (primary) hypertension: Secondary | ICD-10-CM | POA: Diagnosis not present

## 2019-11-16 NOTE — ED Notes (Signed)
No call from pt for final call for room.

## 2019-11-16 NOTE — ED Notes (Signed)
I called patient to recheck vitals and no one responded 
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
## 2019-12-29 ENCOUNTER — Other Ambulatory Visit: Payer: Self-pay

## 2019-12-29 ENCOUNTER — Ambulatory Visit (HOSPITAL_COMMUNITY)
Admission: EM | Admit: 2019-12-29 | Discharge: 2019-12-29 | Disposition: A | Discharge status: Home / Self Care | Payer: Medicare Other
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
## 2020-01-25 ENCOUNTER — Other Ambulatory Visit: Payer: Self-pay

## 2020-01-25 ENCOUNTER — Emergency Department (HOSPITAL_COMMUNITY): Payer: Medicare Other

## 2020-01-25 ENCOUNTER — Emergency Department (HOSPITAL_COMMUNITY)
Admission: EM | Admit: 2020-01-25 | Discharge: 2020-01-25 | Disposition: A | Discharge status: Home / Self Care | Payer: Medicare Other | Attending: Emergency Medicine | Admitting: Emergency Medicine

## 2020-01-25 DIAGNOSIS — F17210 Nicotine dependence, cigarettes, uncomplicated: Secondary | ICD-10-CM | POA: Diagnosis not present

## 2020-01-25 DIAGNOSIS — M436 Torticollis: Secondary | ICD-10-CM | POA: Diagnosis not present

## 2020-01-25 DIAGNOSIS — I10 Essential (primary) hypertension: Secondary | ICD-10-CM | POA: Insufficient documentation

## 2020-01-25 DIAGNOSIS — Z79899 Other long term (current) drug therapy: Secondary | ICD-10-CM | POA: Insufficient documentation

## 2020-01-25 DIAGNOSIS — M25511 Pain in right shoulder: Secondary | ICD-10-CM | POA: Diagnosis present

## 2020-01-25 NOTE — Social Work (Signed)
CSW assisted Pt with transportation needs via [employer] system.

## 2020-01-25 NOTE — ED Provider Notes (Signed)
[facility]-EMERGENCY DEPT Provider Note   CSN: [id number] Arrival date & time: 01/25/20  1109     History No chief complaint on file.   [name] is a 59 y.o. female.  HPI Patient presents for evaluation of pain in the right clavicular area which she states has been present for 2 days, and started after she slept in a chair.  She came here by EMS.  They reportedly found her to be hypoxic with an oxygen saturation of 88% on room air.  They treated her with nasal cannula oxygen with improvement for transport.  On arrival here, her oxygen supplementation was removed and she stabilized had normal oxygen saturation.  Patient admits to smoking crack and drinking alcohol yesterday.  At the time I saw her, 12:03 PM, she stated that she was ready to go home and did not have any further requirements.  She denies shortness of breath, cough, fever, chills, chest pain, weakness or dizziness.  She denies other recent illnesses.  There are no other known modifying factors.    Past Medical History:  Diagnosis Date  . Arthritis   . Bipolar 1 disorder (HCC)   . Hypertension   . Obesity   . Psychiatric illness   . Schizo-affective schizophrenia (HCC)   . Seizures (HCC)   . Substance abuse [facility])     Patient Active Problem List   Diagnosis Date Noted  . Airway compromise   . Acute respiratory failure (HCC)   . Tracheostomy status (HCC)   . Angioedema 06/28/2019  . Substance induced mood disorder (HCC) 12/27/2015  . Fever, unspecified 09/13/2015  . Leukocytosis 09/13/2015  . Acute encephalopathy 09/13/2015  . Substance abuse (HCC) 09/12/2015  . Hallucination   . Homicidal ideation   . Schizophrenia, unspecified type (HCC)   . Schizoaffective disorder, bipolar type (HCC)   . Schizoaffective disorder (HCC) 03/29/2013  . Cocaine abuse (HCC) 06/30/2011  . Non-compliant patient 06/29/2011  . Hypertension 06/29/2011  . Alcohol dependence (HCC) 06/28/2011  .  Polysubstance dependence (HCC) 04/03/2011  . Psychiatric problem 04/03/2011  . Polysubstance abuse (HCC) 06/21/2010  . Schizophrenia (HCC) 06/21/2010    Past Surgical History:  Procedure Laterality Date  . STOMACH SURGERY    . TRACHEOSTOMY TUBE PLACEMENT N/A 06/28/2019   Procedure: TRACHEOSTOMY - 8DCT;  Surgeon: Serena Colonel, MD;  Location: [facility] OR;  Service: ENT;  Laterality: N/A;     OB History   No obstetric history on file.     Family History  Problem Relation Age of Onset  . Alzheimer's disease Mother   . Depression Mother     Social History   Tobacco Use  . Smoking status: Current Every Day Smoker    Packs/day: 1.00    Types: Cigarettes  . Smokeless tobacco: Never Used  Substance Use Topics  . Alcohol use: Yes    Alcohol/week: 6.0 standard drinks    Types: 6 Cans of beer per week  . Drug use: Yes    Frequency: 5.0 times per week    Types: "Crack" cocaine, Cocaine    Comment: crack/cocaine    Home Medications Prior to Admission medications   Medication Sig Start Date End Date Taking? Authorizing Provider  amLODipine (NORVASC) 5 MG tablet Take 1 tablet (5 mg total) by mouth daily. 07/20/19   Regalado, Belkys A, MD  famotidine (PEPCID) 20 MG tablet Take 1 tablet (20 mg total) by mouth 2 (two) times daily. 07/19/19   Regalado, Prentiss Bells, MD  hydrochlorothiazide (  MICROZIDE) 12.5 MG capsule Take 1 capsule (12.5 mg total) by mouth daily. 07/19/19   Regalado, Belkys A, MD  labetalol (NORMODYNE) 100 MG tablet Take 1 tablet (100 mg total) by mouth 2 (two) times daily. 07/19/19   Regalado, Belkys A, MD  metFORMIN (GLUCOPHAGE) 500 MG tablet Take 1 tablet (500 mg total) by mouth 2 (two) times daily with a meal. 07/19/19 07/18/20  Regalado, Belkys A, MD  nicotine (NICODERM CQ - DOSED IN MG/24 HOURS) 21 mg/24hr patch Place 1 patch (21 mg total) onto the skin daily. 07/20/19   Regalado, Belkys A, MD  paliperidone (INVEGA SUSTENNA) 234 MG/1.5ML SUSP injection Inject 234 mg into the muscle every  30 (thirty) days.    [provider]  QUEtiapine (SEROQUEL) 100 MG tablet Take 1 tablet (100 mg total) by mouth at bedtime. 07/19/19   Regalado, Jon Billings A, MD    Allergies    Lisinopril  Review of Systems   Review of Systems  All other systems reviewed and are negative.   Physical Exam Updated Vital Signs BP (!) 169/103 (BP Location: Left Arm)   Pulse 96   Temp 98.4 F (36.9 C) (Oral)   Resp 17   Ht 5\' 7"  (1.702 m)   LMP 01/13/2012   SpO2 97%   BMI 37.50 kg/m   Physical Exam Vitals and nursing note reviewed.  Constitutional:      General: She is not in acute distress.    Appearance: She is well-developed. She is obese. She is not ill-appearing, toxic-appearing or diaphoretic.  HENT:     Head: Normocephalic and atraumatic.     Right Ear: External ear normal.     Left Ear: External ear normal.     Mouth/Throat:     Mouth: Mucous membranes are moist.     Pharynx: No oropharyngeal exudate or posterior oropharyngeal erythema.  Eyes:     Conjunctiva/sclera: Conjunctivae normal.     Pupils: Pupils are equal, round, and reactive to light.  Neck:     Trachea: Phonation normal.  Cardiovascular:     Rate and Rhythm: Normal rate.  Pulmonary:     Effort: Pulmonary effort is normal.  Abdominal:     Palpations: There is no mass.  Musculoskeletal:        General: Normal range of motion.     Cervical back: Normal range of motion and neck supple.     Comments: Mild tenderness in the right clavicle without deformity, and tenderness of the right lower paravertebral musculature.  Skin:    General: Skin is warm and dry.  Neurological:     Mental Status: She is alert and oriented to person, place, and time.     Cranial Nerves: No cranial nerve deficit.     Sensory: No sensory deficit.     Motor: No abnormal muscle tone.     Coordination: Coordination normal.  Psychiatric:        Mood and Affect: Mood normal.        Behavior: Behavior normal.        Thought Content:  Thought content normal.        Judgment: Judgment normal.     ED Results / Procedures / Treatments   Labs (all labs ordered are listed, but only abnormal results are displayed) Labs Reviewed  RESPIRATORY PANEL BY RT PCR (FLU A&B, COVID)  COMPREHENSIVE METABOLIC PANEL  CBC WITH DIFFERENTIAL/PLATELET  BLOOD GAS, VENOUS  URINALYSIS, ROUTINE W REFLEX MICROSCOPIC  RAPID URINE DRUG SCREEN, HOSP  PERFORMED    EKG None  Radiology No results found.  Procedures Procedures (including critical care time)  Medications Ordered in ED Medications - No data to display  ED Course  I have reviewed the triage vital signs and the nursing notes.  Pertinent labs & imaging results that were available during my care of the patient were reviewed by me and considered in my medical decision making (see chart for details).    MDM Rules/Calculators/A&P                           Patient Vitals for the past 24 hrs:  BP Temp Temp src Pulse Resp SpO2 Height  01/25/20 1140 (!) 169/103 98.4 F (36.9 C) Oral 96 17 97 % --  01/25/20 1118 -- -- -- -- -- -- 5\' 7"  (1.702 m)  01/25/20 1117 (!) 158/104 98.2 F (36.8 C) Oral 100 20 100 % --    12:23 PM Reevaluation with update and discussion. After initial assessment and treatment, an updated evaluation reveals no additional complaints, findings discussed and questions answered. 01/27/20   Medical Decision Making:  This patient is presenting for evaluation of right-sided neck pain, which does not require a range of treatment options, and is not a complaint that involves a high risk of morbidity and mortality. The differential diagnoses include muscle strain, occult injury. I decided to review old records, and in summary middle-aged female who is debilitated, and drinks alcohol and uses crack cocaine, presenting with atraumatic neck pain and right clavicle pain.  I did not require additional historical information from anyone.    Critical  Interventions-clinical evaluation, discussion with the patient.  Patient requested to be discharged.  Oxygenation normalized without ongoing management.  Mild hypertension is present  After These Interventions, the Patient was reevaluated and was found stable for discharge  CRITICAL CARE-no Performed by: Mancel Bale  Nursing Notes Reviewed/ Care Coordinated Applicable Imaging Reviewed Interpretation of Laboratory Data incorporated into ED treatment  The patient appears reasonably screened and/or stabilized for discharge and I doubt any other medical condition or other [facility] requiring further screening, evaluation, or treatment in the ED at this time prior to discharge.  Plan: Home Medications-continue usual take Tylenol if needed for pain; Home Treatments-heat therapy to affected area; return here if the recommended treatment, does not improve the symptoms; Recommended follow up-PCP, as needed     Final Clinical Impression(s) / ED Diagnoses Final diagnoses:  Wry neck    Rx / DC Orders ED Discharge Orders    None       [facility], MD 01/25/20 1223

## 2020-01-25 NOTE — Discharge Instructions (Signed)
The pain in your neck and right clavicle area can be from a pinched nerve or muscle strain.  To treat this use heat on the sore area 3-4 times a day and take Tylenol for pain.  Try to avoid using alcohol or cocaine.  See your doctor as needed for problems.

## 2020-01-25 NOTE — ED Notes (Signed)
Reached out to social services for ride home for pt.

## 2020-01-25 NOTE — ED Triage Notes (Signed)
Patient bib ptar, c/o breathing difficulty. ptar states o2 sat was 88% on arrival, patient given 3l [place] with ptar, sats increased to 98%, ptar states patient bp 136/74, temp 97.8, pulse 100 - ptar states patient says she smoked crack and drank alcohol last night and wants rehab now. ptar states patient has history of bipolar and schizophrenia. Patient reports pain in neck due to previous surgery.

## 2020-05-02 ENCOUNTER — Other Ambulatory Visit: Payer: Self-pay

## 2020-05-02 ENCOUNTER — Inpatient Hospital Stay (HOSPITAL_COMMUNITY)
Admission: EM | Admit: 2020-05-02 | Discharge: 2020-05-06 | DRG: 291 | Disposition: A | Discharge status: Home / Self Care | Payer: Medicare Other | Attending: Internal Medicine | Admitting: Internal Medicine

## 2020-05-02 ENCOUNTER — Emergency Department (HOSPITAL_COMMUNITY): Payer: Medicare Other

## 2020-05-02 ENCOUNTER — Encounter (HOSPITAL_COMMUNITY): Payer: Self-pay

## 2020-05-02 DIAGNOSIS — I5031 Acute diastolic (congestive) heart failure: Secondary | ICD-10-CM | POA: Diagnosis present

## 2020-05-02 DIAGNOSIS — Z91199 Patient's noncompliance with other medical treatment and regimen due to unspecified reason: Secondary | ICD-10-CM

## 2020-05-02 DIAGNOSIS — F250 Schizoaffective disorder, bipolar type: Secondary | ICD-10-CM | POA: Diagnosis present

## 2020-05-02 DIAGNOSIS — Z20822 Contact with and (suspected) exposure to covid-19: Secondary | ICD-10-CM | POA: Diagnosis present

## 2020-05-02 DIAGNOSIS — J810 Acute pulmonary edema: Secondary | ICD-10-CM

## 2020-05-02 DIAGNOSIS — I10 Essential (primary) hypertension: Secondary | ICD-10-CM | POA: Diagnosis present

## 2020-05-02 DIAGNOSIS — F1910 Other psychoactive substance abuse, uncomplicated: Secondary | ICD-10-CM | POA: Diagnosis present

## 2020-05-02 DIAGNOSIS — Z9119 Patient's noncompliance with other medical treatment and regimen: Secondary | ICD-10-CM

## 2020-05-02 DIAGNOSIS — I509 Heart failure, unspecified: Secondary | ICD-10-CM | POA: Diagnosis not present

## 2020-05-02 DIAGNOSIS — F1410 Cocaine abuse, uncomplicated: Secondary | ICD-10-CM | POA: Diagnosis present

## 2020-05-02 DIAGNOSIS — J9601 Acute respiratory failure with hypoxia: Secondary | ICD-10-CM | POA: Diagnosis present

## 2020-05-02 DIAGNOSIS — Z79899 Other long term (current) drug therapy: Secondary | ICD-10-CM

## 2020-05-02 DIAGNOSIS — Z7984 Long term (current) use of oral hypoglycemic drugs: Secondary | ICD-10-CM

## 2020-05-02 DIAGNOSIS — Z818 Family history of other mental and behavioral disorders: Secondary | ICD-10-CM

## 2020-05-02 DIAGNOSIS — I110 Hypertensive heart disease with heart failure: Principal | ICD-10-CM | POA: Diagnosis present

## 2020-05-02 DIAGNOSIS — R410 Disorientation, unspecified: Secondary | ICD-10-CM | POA: Diagnosis present

## 2020-05-02 DIAGNOSIS — R4182 Altered mental status, unspecified: Secondary | ICD-10-CM

## 2020-05-02 DIAGNOSIS — F17210 Nicotine dependence, cigarettes, uncomplicated: Secondary | ICD-10-CM | POA: Diagnosis present

## 2020-05-02 DIAGNOSIS — N390 Urinary tract infection, site not specified: Secondary | ICD-10-CM | POA: Diagnosis present

## 2020-05-02 DIAGNOSIS — Z6837 Body mass index (BMI) 37.0-37.9, adult: Secondary | ICD-10-CM

## 2020-05-02 DIAGNOSIS — Z888 Allergy status to other drugs, medicaments and biological substances status: Secondary | ICD-10-CM

## 2020-05-02 DIAGNOSIS — E669 Obesity, unspecified: Secondary | ICD-10-CM | POA: Diagnosis present

## 2020-05-02 DIAGNOSIS — Z820 Family history of epilepsy and other diseases of the nervous system: Secondary | ICD-10-CM

## 2020-05-02 LAB — URINALYSIS, ROUTINE W REFLEX MICROSCOPIC
Bilirubin Urine: NEGATIVE
Glucose, UA: NEGATIVE mg/dL
Hgb urine dipstick: NEGATIVE
Ketones, ur: NEGATIVE mg/dL
Nitrite: NEGATIVE
Protein, ur: NEGATIVE mg/dL
Specific Gravity, Urine: 1.019 (ref 1.005–1.030)
WBC, UA: >50 WBC/hpf — ABNORMAL HIGH (ref 0–5)
pH: 6 (ref 5.0–8.0)

## 2020-05-02 LAB — CBC
HCT: 46.4 % — ABNORMAL HIGH (ref 36.0–46.0)
Hemoglobin: 14.2 g/dL (ref 12.0–15.0)
MCH: 27.6 pg (ref 26.0–34.0)
MCHC: 30.6 g/dL (ref 30.0–36.0)
MCV: 90.1 fL (ref 80.0–100.0)
Platelets: 311 10*3/uL (ref 150–400)
RBC: 5.15 MIL/uL — ABNORMAL HIGH (ref 3.87–5.11)
RDW: 14.6 % (ref 11.5–15.5)
WBC: 5.9 10*3/uL (ref 4.0–10.5)
nRBC: 0 % (ref 0.0–0.2)

## 2020-05-02 LAB — I-STAT BETA HCG BLOOD, ED (MC, WL, AP ONLY): I-stat hCG, quantitative: <5 m[IU]/mL (ref <5)

## 2020-05-02 LAB — TROPONIN I (HIGH SENSITIVITY)
Troponin I (High Sensitivity): 9 ng/L (ref <18)
Troponin I (High Sensitivity): 9 ng/L (ref <18)

## 2020-05-02 LAB — BASIC METABOLIC PANEL
Anion gap: 11 (ref 5–15)
BUN: 5 mg/dL — ABNORMAL LOW (ref 6–20)
CO2: 31 mmol/L (ref 22–32)
Calcium: 9.3 mg/dL (ref 8.9–10.3)
Chloride: 98 mmol/L (ref 98–111)
Creatinine, Ser: 0.73 mg/dL (ref 0.44–1.00)
GFR, Estimated: >60 mL/min (ref >60)
Glucose, Bld: 149 mg/dL — ABNORMAL HIGH (ref 70–99)
Potassium: 4.1 mmol/L (ref 3.5–5.1)
Sodium: 140 mmol/L (ref 135–145)

## 2020-05-02 NOTE — ED Notes (Signed)
Patient stepped outside, will obtain VS when patient returns

## 2020-05-02 NOTE — ED Triage Notes (Signed)
Patient arrived by [facility] with complaint of HTN and feeling as if heart racing. Patient alert to baseline and in no distress

## 2020-05-03 ENCOUNTER — Encounter (HOSPITAL_COMMUNITY): Payer: Self-pay | Admitting: Emergency Medicine

## 2020-05-03 ENCOUNTER — Inpatient Hospital Stay (HOSPITAL_COMMUNITY): Payer: Medicare Other

## 2020-05-03 ENCOUNTER — Other Ambulatory Visit: Payer: Self-pay

## 2020-05-03 DIAGNOSIS — I110 Hypertensive heart disease with heart failure: Secondary | ICD-10-CM | POA: Diagnosis present

## 2020-05-03 DIAGNOSIS — R410 Disorientation, unspecified: Secondary | ICD-10-CM | POA: Diagnosis present

## 2020-05-03 DIAGNOSIS — Z20822 Contact with and (suspected) exposure to covid-19: Secondary | ICD-10-CM | POA: Diagnosis present

## 2020-05-03 DIAGNOSIS — Z79899 Other long term (current) drug therapy: Secondary | ICD-10-CM | POA: Diagnosis not present

## 2020-05-03 DIAGNOSIS — Z888 Allergy status to other drugs, medicaments and biological substances status: Secondary | ICD-10-CM | POA: Diagnosis not present

## 2020-05-03 DIAGNOSIS — F250 Schizoaffective disorder, bipolar type: Secondary | ICD-10-CM | POA: Diagnosis present

## 2020-05-03 DIAGNOSIS — I10 Essential (primary) hypertension: Secondary | ICD-10-CM | POA: Diagnosis not present

## 2020-05-03 DIAGNOSIS — J810 Acute pulmonary edema: Secondary | ICD-10-CM

## 2020-05-03 DIAGNOSIS — Z9119 Patient's noncompliance with other medical treatment and regimen: Secondary | ICD-10-CM | POA: Diagnosis not present

## 2020-05-03 DIAGNOSIS — I509 Heart failure, unspecified: Secondary | ICD-10-CM | POA: Diagnosis present

## 2020-05-03 DIAGNOSIS — F17210 Nicotine dependence, cigarettes, uncomplicated: Secondary | ICD-10-CM | POA: Diagnosis present

## 2020-05-03 DIAGNOSIS — Z6837 Body mass index (BMI) 37.0-37.9, adult: Secondary | ICD-10-CM | POA: Diagnosis not present

## 2020-05-03 DIAGNOSIS — Z7984 Long term (current) use of oral hypoglycemic drugs: Secondary | ICD-10-CM | POA: Diagnosis not present

## 2020-05-03 DIAGNOSIS — I5031 Acute diastolic (congestive) heart failure: Secondary | ICD-10-CM

## 2020-05-03 DIAGNOSIS — Z818 Family history of other mental and behavioral disorders: Secondary | ICD-10-CM | POA: Diagnosis not present

## 2020-05-03 DIAGNOSIS — J9601 Acute respiratory failure with hypoxia: Secondary | ICD-10-CM | POA: Diagnosis present

## 2020-05-03 DIAGNOSIS — Z820 Family history of epilepsy and other diseases of the nervous system: Secondary | ICD-10-CM | POA: Diagnosis not present

## 2020-05-03 DIAGNOSIS — E669 Obesity, unspecified: Secondary | ICD-10-CM | POA: Diagnosis present

## 2020-05-03 DIAGNOSIS — N390 Urinary tract infection, site not specified: Secondary | ICD-10-CM | POA: Diagnosis present

## 2020-05-03 DIAGNOSIS — R4182 Altered mental status, unspecified: Secondary | ICD-10-CM

## 2020-05-03 DIAGNOSIS — F1910 Other psychoactive substance abuse, uncomplicated: Secondary | ICD-10-CM | POA: Diagnosis present

## 2020-05-03 DIAGNOSIS — F1410 Cocaine abuse, uncomplicated: Secondary | ICD-10-CM | POA: Diagnosis present

## 2020-05-03 LAB — GLUCOSE, CAPILLARY
Glucose-Capillary: 172 mg/dL — ABNORMAL HIGH (ref 70–99)
Glucose-Capillary: 144 mg/dL — ABNORMAL HIGH (ref 70–99)
Glucose-Capillary: 214 mg/dL — ABNORMAL HIGH (ref 70–99)

## 2020-05-03 LAB — MAGNESIUM: Magnesium: 1.8 mg/dL (ref 1.7–2.4)

## 2020-05-03 LAB — RAPID URINE DRUG SCREEN, HOSP PERFORMED
Amphetamines: NOT DETECTED
Barbiturates: NOT DETECTED
Benzodiazepines: NOT DETECTED
Cocaine: NOT DETECTED
Opiates: NOT DETECTED
Tetrahydrocannabinol: NOT DETECTED

## 2020-05-03 LAB — RESP PANEL BY RT-PCR (FLU A&B, COVID) ARPGX2
Influenza A by PCR: NEGATIVE
Influenza B by PCR: NEGATIVE
SARS Coronavirus 2 by RT PCR: NEGATIVE

## 2020-05-03 LAB — ECHOCARDIOGRAM COMPLETE
Area-P 1/2: 2.2 cm2
Height: 67 in
S' Lateral: 3.46 cm
Weight: 3858.93 oz

## 2020-05-03 LAB — PROCALCITONIN: Procalcitonin: <0.1 ng/mL

## 2020-05-03 LAB — BRAIN NATRIURETIC PEPTIDE: B Natriuretic Peptide: 57.7 pg/mL (ref 0.0–100.0)

## 2020-05-03 LAB — HEMOGLOBIN A1C
Hgb A1c MFr Bld: 7.9 % — ABNORMAL HIGH (ref 4.8–5.6)
Mean Plasma Glucose: 180.03 mg/dL

## 2020-05-03 LAB — CBG MONITORING, ED: Glucose-Capillary: 181 mg/dL — ABNORMAL HIGH (ref 70–99)

## 2020-05-03 LAB — D-DIMER, QUANTITATIVE: D-Dimer, Quant: 0.38 ug/mL-FEU (ref 0.00–0.50)

## 2020-05-03 LAB — C-REACTIVE PROTEIN: CRP: 0.8 mg/dL (ref <1.0)

## 2020-05-03 MED ORDER — LABETALOL HCL 100 MG PO TABS
100.0000 mg | ORAL_TABLET | Freq: Two times a day (BID) | ORAL | Status: DC
  Administered 2020-05-03 – 2020-05-06 (×7): 100 mg via ORAL
  Filled 2020-05-03 (×7): qty 1

## 2020-05-03 MED ORDER — THIAMINE HCL 100 MG/ML IJ SOLN
100.0000 mg | Freq: Every day | INTRAMUSCULAR | Status: DC
Start: 2020-05-03 — End: 2020-05-03

## 2020-05-03 MED ORDER — QUETIAPINE FUMARATE 100 MG PO TABS
100.0000 mg | ORAL_TABLET | Freq: Every day | ORAL | Status: DC
Start: 2020-05-03 — End: 2020-05-06
  Administered 2020-05-03 – 2020-05-05 (×3): 100 mg via ORAL
  Filled 2020-05-03 (×4): qty 1

## 2020-05-03 MED ORDER — SODIUM CHLORIDE 0.9 % IV SOLN: 250.0000 mL | INTRAVENOUS | Status: DC | PRN

## 2020-05-03 MED ORDER — GUAIFENESIN-DM 100-10 MG/5ML PO SYRP
10.0000 mL | ORAL_SOLUTION | Freq: Three times a day (TID) | ORAL | Status: DC | PRN
  Administered 2020-05-03: 10 mL via ORAL
  Filled 2020-05-03: qty 10

## 2020-05-03 MED ORDER — INSULIN ASPART 100 UNIT/ML ~~LOC~~ SOLN
0.0000 [IU] | Freq: Three times a day (TID) | SUBCUTANEOUS | Status: DC
  Administered 2020-05-03 (×2): 3 [IU] via SUBCUTANEOUS
  Administered 2020-05-03: 2 [IU] via SUBCUTANEOUS
  Administered 2020-05-04 – 2020-05-05 (×4): 3 [IU] via SUBCUTANEOUS
  Administered 2020-05-05 (×2): 5 [IU] via SUBCUTANEOUS
  Administered 2020-05-06: 2 [IU] via SUBCUTANEOUS

## 2020-05-03 MED ORDER — THIAMINE HCL 100 MG PO TABS: 100.0000 mg | ORAL_TABLET | Freq: Every day | ORAL | Status: DC

## 2020-05-03 MED ORDER — HYDRALAZINE HCL 20 MG/ML IJ SOLN: 10.0000 mg | INTRAMUSCULAR | Status: DC | PRN

## 2020-05-03 MED ORDER — FOLIC ACID 1 MG PO TABS: 1.0000 mg | ORAL_TABLET | Freq: Every day | ORAL | Status: DC

## 2020-05-03 MED ORDER — SODIUM CHLORIDE 0.9% FLUSH: 3.0000 mL | INTRAVENOUS | Status: DC | PRN

## 2020-05-03 MED ORDER — TRAMADOL HCL 50 MG PO TABS
50.0000 mg | ORAL_TABLET | Freq: Two times a day (BID) | ORAL | Status: DC | PRN
  Administered 2020-05-03 – 2020-05-04 (×2): 50 mg via ORAL
  Filled 2020-05-03 (×2): qty 1

## 2020-05-03 MED ORDER — SODIUM CHLORIDE 0.9 % IV SOLN
1.0000 g | Freq: Once | INTRAVENOUS | Status: AC
  Administered 2020-05-03: 1 g via INTRAVENOUS
  Filled 2020-05-03: qty 10

## 2020-05-03 MED ORDER — NICOTINE 21 MG/24HR TD PT24
21.0000 mg | MEDICATED_PATCH | Freq: Every day | TRANSDERMAL | Status: DC
  Administered 2020-05-03 – 2020-05-06 (×4): 21 mg via TRANSDERMAL
  Filled 2020-05-03 (×4): qty 1

## 2020-05-03 MED ORDER — SODIUM CHLORIDE 0.9 % IV SOLN
1.0000 g | Freq: Every day | INTRAVENOUS | Status: DC
  Administered 2020-05-04 – 2020-05-05 (×2): 1 g via INTRAVENOUS
  Filled 2020-05-03 (×4): qty 10

## 2020-05-03 MED ORDER — AMLODIPINE BESYLATE 5 MG PO TABS
5.0000 mg | ORAL_TABLET | Freq: Every day | ORAL | Status: DC
  Administered 2020-05-03 – 2020-05-06 (×4): 5 mg via ORAL
  Filled 2020-05-03 (×4): qty 1

## 2020-05-03 MED ORDER — SODIUM CHLORIDE 0.9% FLUSH
3.0000 mL | Freq: Two times a day (BID) | INTRAVENOUS | Status: DC
  Administered 2020-05-03 – 2020-05-06 (×8): 3 mL via INTRAVENOUS

## 2020-05-03 MED ORDER — FUROSEMIDE 10 MG/ML IJ SOLN
40.0000 mg | Freq: Once | INTRAMUSCULAR | Status: AC
  Administered 2020-05-03: 40 mg via INTRAVENOUS
  Filled 2020-05-03: qty 4

## 2020-05-03 MED ORDER — ENOXAPARIN SODIUM 40 MG/0.4ML ~~LOC~~ SOLN
40.0000 mg | SUBCUTANEOUS | Status: DC
  Administered 2020-05-03 – 2020-05-05 (×3): 40 mg via SUBCUTANEOUS
  Filled 2020-05-03 (×3): qty 0.4

## 2020-05-03 MED ORDER — FAMOTIDINE 20 MG PO TABS
20.0000 mg | ORAL_TABLET | Freq: Two times a day (BID) | ORAL | Status: DC
  Administered 2020-05-03 – 2020-05-06 (×7): 20 mg via ORAL
  Filled 2020-05-03 (×7): qty 1

## 2020-05-03 MED ORDER — FUROSEMIDE 10 MG/ML IJ SOLN
40.0000 mg | Freq: Every day | INTRAMUSCULAR | Status: DC
  Administered 2020-05-03 – 2020-05-06 (×4): 40 mg via INTRAVENOUS
  Filled 2020-05-03 (×4): qty 4

## 2020-05-03 MED ORDER — INSULIN ASPART 100 UNIT/ML ~~LOC~~ SOLN
0.0000 [IU] | Freq: Every day | SUBCUTANEOUS | Status: DC
  Administered 2020-05-03: 2 [IU] via SUBCUTANEOUS

## 2020-05-03 MED ORDER — LORAZEPAM 1 MG PO TABS: 1.0000 mg | ORAL_TABLET | ORAL | Status: DC | PRN

## 2020-05-03 MED ORDER — ACETAMINOPHEN 325 MG PO TABS
650.0000 mg | ORAL_TABLET | ORAL | Status: DC | PRN
  Administered 2020-05-03 – 2020-05-05 (×5): 650 mg via ORAL
  Filled 2020-05-03 (×5): qty 2

## 2020-05-03 MED ORDER — LORAZEPAM 2 MG/ML IJ SOLN: 1.0000 mg | INTRAMUSCULAR | Status: DC | PRN

## 2020-05-03 MED ORDER — ADULT MULTIVITAMIN W/MINERALS CH: 1.0000 | ORAL_TABLET | Freq: Every day | ORAL | Status: DC

## 2020-05-03 MED ORDER — ACETAMINOPHEN 325 MG PO TABS
650.0000 mg | ORAL_TABLET | Freq: Once | ORAL | Status: AC
  Administered 2020-05-03: 650 mg via ORAL
  Filled 2020-05-03: qty 2

## 2020-05-03 MED ORDER — ONDANSETRON HCL 4 MG/2ML IJ SOLN: 4.0000 mg | Freq: Four times a day (QID) | INTRAMUSCULAR | Status: DC | PRN

## 2020-05-03 NOTE — Progress Notes (Signed)
Patient complains of cough, posterior neck and lower back pain. Tylenol was administered for pain. Pt request alternative analgesic. [facility] Hospitalist provider contacted. V.o received to administer Robitussin DM 10 ml PO every 8 hours prn coughand Ultram 50 mg PO every 12 hours prn pain.

## 2020-05-03 NOTE — ED Notes (Signed)
Assumed care of this patient. Vitals taken. Pt reports SOB and back pain. Pt oriented to place and person. NAD. Patient able to speak full sentences. Connected to cardiac monitor, BP, pulse ox. Stretcher low, wheels locked, call bell within reach.

## 2020-05-03 NOTE — Progress Notes (Signed)
Telemetry and oxygen placed back on pt

## 2020-05-03 NOTE — Progress Notes (Signed)
Telemetry and oxygen placed back on pt. Pt states"her breathing is fine". Pt wants to speak with a case manager to help her get back in the hotel. Told pt at shift change it would be in the morning hopefully she would see someone. Patient asks again now. Patient bs elevated patient askes for soda, diet soda given.

## 2020-05-03 NOTE — ED Notes (Signed)
Pt placed on 2L [place] due to O2 sats being 87-90% while sleeping. O2 sats now 95% on 2L [place].

## 2020-05-03 NOTE — ED Notes (Signed)
Blood cultures/labs collected labeled and sent to lab.

## 2020-05-03 NOTE — ED Notes (Signed)
[facility] called. 

## 2020-05-03 NOTE — H&P (Signed)
History and Physical    [name] ZJQ:[id number] DOB: 08-Jun-1960 DOA: 05/02/2020  PCP: [facility]  Patient coming from: [facility]  I have personally briefly reviewed patient's old medical records in [facility]  Chief Complaint: UTI, SOB  HPI: [name] is a 60 y.o. female with medical history significant of schizo-affective schizophrenia, BPD, polysubstance abuse, HTN.  Pt with h/o cocaine abuse and EtOH abuse in past though not anymore.  Reports that now a days she just smokes "a ton" of cigarettes.  Pt presents to ED with multiple complaints: First she has dysuria and foul smelling urine. Second she has difficulty breathing, palpitations, and high blood pressure.  Not clear on onset or duration of symptoms.  Symptoms are persistent and nothing makes better or worse.   ED Course:  1) pt has UTI, started on rocephin 2) pt appears to have new onset CHF with mild pulm edema on CXR.   Review of Systems: As per HPI, otherwise all review of systems negative.  Past Medical History:  Diagnosis Date  . Arthritis   . Bipolar 1 disorder (HCC)   . Hypertension   . Obesity   . Psychiatric illness   . Schizo-affective schizophrenia (HCC)   . Seizures (HCC)   . Substance abuse [facility])     Past Surgical History:  Procedure Laterality Date  . STOMACH SURGERY    . TRACHEOSTOMY TUBE PLACEMENT N/A 06/28/2019   Procedure: TRACHEOSTOMY - 8DCT;  Surgeon: Serena Colonel, MD;  Location: [facility] OR;  Service: ENT;  Laterality: N/A;     reports that she has been smoking cigarettes. She has been smoking about 1.00 pack per day. She has never used smokeless tobacco. She reports current alcohol use of about 6.0 standard drinks of alcohol per week. She reports current drug use. Frequency: 5.00 times per week. Drugs: "Crack" cocaine and Cocaine.  Allergies  Allergen Reactions  . Lisinopril Swelling    Angioedema resulting in emergent trach    Family History  Problem  Relation Age of Onset  . Alzheimer's disease Mother   . Depression Mother      Prior to Admission medications   Medication Sig Start Date End Date Taking? Authorizing Provider  amLODipine (NORVASC) 5 MG tablet Take 1 tablet (5 mg total) by mouth daily. 07/20/19   Regalado, Belkys A, MD  famotidine (PEPCID) 20 MG tablet Take 1 tablet (20 mg total) by mouth 2 (two) times daily. 07/19/19   Regalado, Belkys A, MD  hydrochlorothiazide (MICROZIDE) 12.5 MG capsule Take 1 capsule (12.5 mg total) by mouth daily. 07/19/19   Regalado, Belkys A, MD  labetalol (NORMODYNE) 100 MG tablet Take 1 tablet (100 mg total) by mouth 2 (two) times daily. 07/19/19   Regalado, Belkys A, MD  metFORMIN (GLUCOPHAGE) 500 MG tablet Take 1 tablet (500 mg total) by mouth 2 (two) times daily with a meal. 07/19/19 07/18/20  Regalado, Belkys A, MD  nicotine (NICODERM CQ - DOSED IN MG/24 HOURS) 21 mg/24hr patch Place 1 patch (21 mg total) onto the skin daily. 07/20/19   Regalado, Belkys A, MD  paliperidone (INVEGA SUSTENNA) 234 MG/1.5ML SUSP injection Inject 234 mg into the muscle every 30 (thirty) days.    [provider]  QUEtiapine (SEROQUEL) 100 MG tablet Take 1 tablet (100 mg total) by mouth at bedtime. 07/19/19   Alba Cory, MD    Physical Exam: Vitals:   05/02/20 2120 05/03/20 0016 05/03/20 0203 05/03/20 0204  BP: (!) 153/127 Marland Kitchen)  169/128 (!) 139/93   Pulse: (!) 110 (!) 109 89   Resp: 18 18 17    Temp:      TempSrc:      SpO2: 93% 94% 98%   Weight:    115.2 kg  Height:    5\' 7"  (1.702 m)    Constitutional: NAD, calm, comfortable Eyes: PERRL, lids and conjunctivae normal ENMT: Mucous membranes are moist. Posterior pharynx clear of any exudate or lesions.Normal dentition.  Neck: normal, supple, no masses, no thyromegaly Respiratory: clear to auscultation bilaterally, no wheezing, no crackles. Normal respiratory effort. No accessory muscle use.  Cardiovascular: Regular rate and rhythm, no murmurs / rubs / gallops.  No extremity edema. 2+ pedal pulses. No carotid bruits.  Abdomen: no tenderness, no masses palpated. No hepatosplenomegaly. Bowel sounds positive.  Musculoskeletal: no clubbing / cyanosis. No joint deformity upper and lower extremities. Good ROM, no contractures. Normal muscle tone.  Skin: no rashes, lesions, ulcers. No induration Neurologic: CN 2-12 grossly intact. Sensation intact, DTR normal. Strength 5/5 in all 4.  Psychiatric: Normal judgment and insight. Alert and oriented x 3. Normal mood.    Labs on Admission: I have personally reviewed following labs and imaging studies  CBC: Recent Labs  Lab 05/02/20 1343  WBC 5.9  HGB 14.2  HCT 46.4*  MCV 90.1  PLT 311   Basic Metabolic Panel: Recent Labs  Lab 05/02/20 1343  NA 140  K 4.1  CL 98  CO2 31  GLUCOSE 149*  BUN 5*  CREATININE 0.73  CALCIUM 9.3   GFR: Estimated Creatinine Clearance: 100.4 mL/min (by C-G formula based on SCr of 0.73 mg/dL). Liver Function Tests: No results for input(s): AST, ALT, ALKPHOS, BILITOT, PROT, ALBUMIN in the last 168 hours. No results for input(s): LIPASE, AMYLASE in the last 168 hours. No results for input(s): AMMONIA in the last 168 hours. Coagulation Profile: No results for input(s): INR, PROTIME in the last 168 hours. Cardiac Enzymes: No results for input(s): CKTOTAL, CKMB, CKMBINDEX, TROPONINI in the last 168 hours. BNP (last 3 results) No results for input(s): PROBNP in the last 8760 hours. HbA1C: No results for input(s): HGBA1C in the last 72 hours. CBG: No results for input(s): GLUCAP in the last 168 hours. Lipid Profile: No results for input(s): CHOL, HDL, LDLCALC, TRIG, CHOLHDL, LDLDIRECT in the last 72 hours. Thyroid Function Tests: No results for input(s): TSH, T4TOTAL, FREET4, T3FREE, THYROIDAB in the last 72 hours. Anemia Panel: No results for input(s): VITAMINB12, FOLATE, FERRITIN, TIBC, IRON, RETICCTPCT in the last 72 hours. Urine analysis:    Component Value  Date/Time   COLORURINE YELLOW 05/02/2020 1433   APPEARANCEUR CLOUDY (A) 05/02/2020 1433   LABSPEC 1.019 05/02/2020 1433   PHURINE 6.0 05/02/2020 1433   GLUCOSEU NEGATIVE 05/02/2020 1433   HGBUR NEGATIVE 05/02/2020 1433   BILIRUBINUR NEGATIVE 05/02/2020 1433   KETONESUR NEGATIVE 05/02/2020 1433   PROTEINUR NEGATIVE 05/02/2020 1433   UROBILINOGEN 0.2 12/08/2014 1937   NITRITE NEGATIVE 05/02/2020 1433   LEUKOCYTESUR LARGE (A) 05/02/2020 1433    Radiological Exams on Admission: DG Chest 2 View  Result Date: 05/02/2020 CLINICAL DATA:  Tachycardia. EXAM: CHEST - 2 VIEW COMPARISON:  06/29/2019 and prior studies FINDINGS: Cardiomegaly and pulmonary vascular congestion noted. Mild interstitial opacities are present. No pleural effusion or pneumothorax. No acute bony abnormalities are identified. IMPRESSION: Cardiomegaly with pulmonary vascular congestion and possible mild interstitial edema. Electronically Signed   By: 05/04/2020 M.D.   On: 05/02/2020 13:45  EKG: Independently reviewed.  Assessment/Plan Principal Problem:   New onset of congestive heart failure (HCC) Active Problems:   Polysubstance abuse (HCC)   Non-compliant patient   Hypertension   Cocaine abuse (HCC)   Schizoaffective disorder, bipolar type (HCC)   Acute lower UTI    1. New onset CHF - 1. CHF pathway 2. 2d echo 3. Lasix 40mg  IV x1 in ED then 40mg  IV daily 4. Tele monitor 2. UTI - 1. Rocephin 2. Culture pending 3. HTN - 1. Resume home BP meds when med rec completed 2. For now will resume amlodipine and labetalol 3. Will add PRN hydralazine 4. H/o polysubstance abuse - 1. Check UDS 2. Denies any recent EtOH or Cocaine use "in a long time" 3. Says now that she is just doing cigarettes. 5. Schizoaffective disorder - 1. Cont home psych meds when med rec completed 6. Socially disadvantaged patient - 1. Sounds like patient meds are brought to her by " from the ACT team". 2. Will have SW reach out  and try and contact them in AM  DVT prophylaxis: Lovenox Code Status: Full Family Communication: No family in room Disposition Plan: Home after CHF work up called: None Admission status: Admit to inpatient  Severity of Illness: The appropriate patient status for this patient is INPATIENT. Inpatient status is judged to be reasonable and necessary in order to provide the required intensity of service to ensure the patient's safety. The patient's presenting symptoms, physical exam findings, and initial radiographic and laboratory data in the context of their chronic comorbidities is felt to place them at high risk for further clinical deterioration. Furthermore, it is not anticipated that the patient will be medically stable for discharge from the hospital within 2 midnights of admission. The following factors support the patient status of inpatient.   IP status due to new onset of CHF.   * I certify that at the point of admission it is my clinical judgment that the patient will require inpatient hospital care spanning beyond 2 midnights from the point of admission due to high intensity of service, high risk for further deterioration and high frequency of surveillance required.*    Paeton Studer M. DO [facility]  How to contact the [facility] Attending or Consulting provider 7A - 7P or covering provider during after hours 7P -7A, for this patient?  1. Check the care team in [facility] and look for a) attending/consulting [facility] provider listed and b) the [facility] team listed 2. Log into [email or web address]  Amion Physician Scheduling and messaging for groups and whole hospitals  On call and physician scheduling software for group practices, residents, hospitalists and other medical providers for call, clinic, rotation and shift schedules. OnCall [employer] is a hospital-wide system for scheduling doctors and paging doctors on call. EasyPlot is for scientific plotting and data analysis.  [email or web address]   and use [place] universal password to access. If you do not have the password, please contact the hospital operator.  3. Locate the [facility] provider you are looking for under [facility] and page to a number that you can be directly reached. 4. If you still have difficulty reaching the provider, please page the [facility] (Director on Call) for the Hospitalists listed on amion for assistance.  05/03/2020, 2:09 AM

## 2020-05-03 NOTE — Plan of Care (Signed)
  Problem: Education: Goal: Knowledge of General Education information will improve Description: Including pain rating scale, medication(s)/side effects and non-pharmacologic comfort measures Outcome: Completed/Met   Problem: Activity: Goal: Risk for activity intolerance will decrease Outcome: Completed/Met   Problem: Pain Managment: Goal: General experience of comfort will improve Outcome: Completed/Met

## 2020-05-03 NOTE — ED Provider Notes (Signed)
[facility] EMERGENCY DEPARTMENT Provider Note   CSN: [id number] Arrival date & time: 05/02/20  1236     History No chief complaint on file.   [name] is a 60 y.o. female.  The history is provided by the patient. The history is limited by the condition of the patient.  Altered Mental Status Presenting symptoms: disorientation   Severity:  Moderate Most recent episode: unknown. Episode history:  Single Timing:  Constant Progression:  Unchanged Chronicity:  New Context: recent infection   Associated symptoms: difficulty breathing, fever and palpitations   Associated symptoms: no abdominal pain, no depression, no nausea, no suicidal behavior and no vomiting   Associated symptoms comment:  Shortness of breath, palpitations      Past Medical History:  Diagnosis Date  . Arthritis   . Bipolar 1 disorder (HCC)   . Hypertension   . Obesity   . Psychiatric illness   . Schizo-affective schizophrenia (HCC)   . Seizures (HCC)   . Substance abuse [facility])     Patient Active Problem List   Diagnosis Date Noted  . Airway compromise   . Acute respiratory failure (HCC)   . Tracheostomy status (HCC)   . Angioedema 06/28/2019  . Substance induced mood disorder (HCC) 12/27/2015  . Fever, unspecified 09/13/2015  . Leukocytosis 09/13/2015  . Acute encephalopathy 09/13/2015  . Substance abuse (HCC) 09/12/2015  . Hallucination   . Homicidal ideation   . Schizophrenia, unspecified type (HCC)   . Schizoaffective disorder, bipolar type (HCC)   . Schizoaffective disorder (HCC) 03/29/2013  . Cocaine abuse (HCC) 06/30/2011  . Non-compliant patient 06/29/2011  . Hypertension 06/29/2011  . Alcohol dependence (HCC) 06/28/2011  . Polysubstance dependence (HCC) 04/03/2011  . Psychiatric problem 04/03/2011  . Polysubstance abuse (HCC) 06/21/2010  . Schizophrenia (HCC) 06/21/2010    Past Surgical History:  Procedure Laterality Date  . STOMACH SURGERY    .  TRACHEOSTOMY TUBE PLACEMENT N/A 06/28/2019   Procedure: TRACHEOSTOMY - 8DCT;  Surgeon: Serena Colonel, MD;  Location: [facility] OR;  Service: ENT;  Laterality: N/A;     OB History   No obstetric history on file.     Family History  Problem Relation Age of Onset  . Alzheimer's disease Mother   . Depression Mother     Social History   Tobacco Use  . Smoking status: Current Every Day Smoker    Packs/day: 1.00    Types: Cigarettes  . Smokeless tobacco: Never Used  Substance Use Topics  . Alcohol use: Yes    Alcohol/week: 6.0 standard drinks    Types: 6 Cans of beer per week  . Drug use: Yes    Frequency: 5.0 times per week    Types: "Crack" cocaine, Cocaine    Comment: crack/cocaine    Home Medications Prior to Admission medications   Medication Sig Start Date End Date Taking? Authorizing Provider  amLODipine (NORVASC) 5 MG tablet Take 1 tablet (5 mg total) by mouth daily. 07/20/19   Regalado, Belkys A, MD  famotidine (PEPCID) 20 MG tablet Take 1 tablet (20 mg total) by mouth 2 (two) times daily. 07/19/19   Regalado, Belkys A, MD  hydrochlorothiazide (MICROZIDE) 12.5 MG capsule Take 1 capsule (12.5 mg total) by mouth daily. 07/19/19   Regalado, Belkys A, MD  labetalol (NORMODYNE) 100 MG tablet Take 1 tablet (100 mg total) by mouth 2 (two) times daily. 07/19/19   Regalado, Belkys A, MD  metFORMIN (GLUCOPHAGE) 500 MG tablet Take 1 tablet (500  mg total) by mouth 2 (two) times daily with a meal. 07/19/19 07/18/20  Regalado, Belkys A, MD  nicotine (NICODERM CQ - DOSED IN MG/24 HOURS) 21 mg/24hr patch Place 1 patch (21 mg total) onto the skin daily. 07/20/19   Regalado, Belkys A, MD  paliperidone (INVEGA SUSTENNA) 234 MG/1.5ML SUSP injection Inject 234 mg into the muscle every 30 (thirty) days.    [provider]  QUEtiapine (SEROQUEL) 100 MG tablet Take 1 tablet (100 mg total) by mouth at bedtime. 07/19/19   Regalado, Belkys A, MD    Allergies    Lisinopril  Review of Systems   Review of  Systems  Unable to perform ROS: Mental status change  Constitutional: Positive for fever.  Respiratory: Positive for shortness of breath.   Cardiovascular: Positive for palpitations.  Gastrointestinal: Negative for abdominal pain, nausea and vomiting.  Genitourinary: Positive for dysuria.    Physical Exam Updated Vital Signs BP (!) 169/128 (BP Location: Left Arm)   Pulse (!) 109   Temp 99.8 F (37.7 C) (Oral)   Resp 18   LMP 01/13/2012   SpO2 94%   Physical Exam Vitals and nursing note reviewed.  Constitutional:      Appearance: She is not diaphoretic.  HENT:     Head: Normocephalic and atraumatic.     Nose: Nose normal.  Eyes:     Conjunctiva/sclera: Conjunctivae normal.     Pupils: Pupils are equal, round, and reactive to light.  Cardiovascular:     Rate and Rhythm: Normal rate and regular rhythm.     Pulses: Normal pulses.     Heart sounds: Normal heart sounds.  Pulmonary:     Effort: No respiratory distress.     Breath sounds: No stridor. Rales present.  Abdominal:     General: Abdomen is flat. Bowel sounds are normal.     Palpations: Abdomen is soft.     Tenderness: There is no abdominal tenderness. There is no guarding or rebound.  Musculoskeletal:        General: No tenderness or deformity.     Cervical back: Normal range of motion and neck supple.  Skin:    General: Skin is warm and dry.     Capillary Refill: Capillary refill takes less than 2 seconds.  Neurological:     Mental Status: She is alert.     Deep Tendon Reflexes: Reflexes normal.  Psychiatric:        Mood and Affect: Mood normal.     ED Results / Procedures / Treatments   Labs (all labs ordered are listed, but only abnormal results are displayed) . Results for orders placed or performed during the hospital encounter of 05/02/20  Basic metabolic panel  Result Value Ref Range   Sodium 140 135 - 145 mmol/L   Potassium 4.1 3.5 - 5.1 mmol/L   Chloride 98 98 - 111 mmol/L   CO2 31 22 - 32  mmol/L   Glucose, Bld 149 (H) 70 - 99 mg/dL   BUN 5 (L) 6 - 20 mg/dL   Creatinine, Ser 8.58 0.44 - 1.00 mg/dL   Calcium 9.3 8.9 - 85.0 mg/dL   GFR, Estimated >27 >74 mL/min   Anion gap 11 5 - 15  CBC  Result Value Ref Range   WBC 5.9 4.0 - 10.5 K/uL   RBC 5.15 (H) 3.87 - 5.11 MIL/uL   Hemoglobin 14.2 12.0 - 15.0 g/dL   HCT 12.8 (H) 78.6 - 76.7 %   MCV 90.1  80.0 - 100.0 fL   MCH 27.6 26.0 - 34.0 pg   MCHC 30.6 30.0 - 36.0 g/dL   RDW 16.114.6 09.611.5 - 04.515.5 %   Platelets 311 150 - 400 K/uL   nRBC 0.0 0.0 - 0.2 %  Urinalysis, Routine w reflex microscopic Urine, Clean Catch  Result Value Ref Range   Color, Urine YELLOW YELLOW   APPearance CLOUDY (A) CLEAR   Specific Gravity, Urine 1.019 1.005 - 1.030   pH 6.0 5.0 - 8.0   Glucose, UA NEGATIVE NEGATIVE mg/dL   Hgb urine dipstick NEGATIVE NEGATIVE   Bilirubin Urine NEGATIVE NEGATIVE   Ketones, ur NEGATIVE NEGATIVE mg/dL   Protein, ur NEGATIVE NEGATIVE mg/dL   Nitrite NEGATIVE NEGATIVE   Leukocytes,Ua LARGE (A) NEGATIVE   RBC / HPF 6-10 0 - 5 RBC/hpf   WBC, UA >50 (H) 0 - 5 WBC/hpf   Bacteria, UA RARE (A) NONE SEEN   Squamous Epithelial / LPF 11-20 0 - 5   Mucus PRESENT    Budding Yeast PRESENT    Hyaline Casts, UA PRESENT   I-Stat beta hCG blood, ED  Result Value Ref Range   I-stat hCG, quantitative <5.0 <5 mIU/mL   Comment 3          Troponin I (High Sensitivity)  Result Value Ref Range   Troponin I (High Sensitivity) 9 <18 ng/L  Troponin I (High Sensitivity)  Result Value Ref Range   Troponin I (High Sensitivity) 9 <18 ng/L   DG Chest 2 View  Result Date: 05/02/2020 CLINICAL DATA:  Tachycardia. EXAM: CHEST - 2 VIEW COMPARISON:  06/29/2019 and prior studies FINDINGS: Cardiomegaly and pulmonary vascular congestion noted. Mild interstitial opacities are present. No pleural effusion or pneumothorax. No acute bony abnormalities are identified. IMPRESSION: Cardiomegaly with pulmonary vascular congestion and possible mild  interstitial edema. Electronically Signed   By: Harmon PierJeffrey  Hu M.D.   On: 05/02/2020 13:45    EKG  Date: 05/03/2020  Rate: 88  Rhythm: normal sinus rhythm  QRS Axis: normal  Intervals: normal  ST/T Wave abnormalities: non-specific ST T changes   Conduction Disutrbances: none  Narrative Interpretation: non-specific ST T changes      Radiology DG Chest 2 View  Result Date: 05/02/2020 CLINICAL DATA:  Tachycardia. EXAM: CHEST - 2 VIEW COMPARISON:  06/29/2019 and prior studies FINDINGS: Cardiomegaly and pulmonary vascular congestion noted. Mild interstitial opacities are present. No pleural effusion or pneumothorax. No acute bony abnormalities are identified. IMPRESSION: Cardiomegaly with pulmonary vascular congestion and possible mild interstitial edema. Electronically Signed   By: Harmon PierJeffrey  Hu M.D.   On: 05/02/2020 13:45    Procedures Procedures   Medications Ordered in ED Medications  cefTRIAXone (ROCEPHIN) 1 g in sodium chloride 0.9 % 100 mL IVPB (has no administration in time range)  furosemide (LASIX) injection 40 mg (has no administration in time range)  acetaminophen (TYLENOL) tablet 650 mg (has no administration in time range)    ED Course  I have reviewed the triage vital signs and the nursing notes.  Pertinent labs & imaging results that were available during my care of the patient were reviewed by me and considered in my medical decision making (see chart for details).     [name] was evaluated in Emergency Department on 05/03/2020 for the symptoms described in the history of present illness. She was evaluated in the context of the global COVID-19 pandemic, which necessitated consideration that the patient might be at risk for infection with the SARS-CoV-2  virus that causes COVID-19. Institutional protocols and algorithms that pertain to the evaluation of patients at risk for COVID-19 are in a state of rapid change based on information released by regulatory bodies  including the CDC and federal and state organizations. These policies and algorithms were followed during the patient's care in the ED. Final Clinical Impression(s) / ED Diagnoses Final diagnoses:  Urinary tract infection without hematuria, site unspecified  Altered mental status, unspecified altered mental status type  Acute pulmonary edema (HCC)    Admit to medicine for AMS with UTI and pulmonary edema    Tareek Sabo, MD 05/03/20 5621

## 2020-05-03 NOTE — Progress Notes (Signed)
  Echocardiogram 2D Echocardiogram has been performed.  Augustine Radar 05/03/2020, 2:12 PM

## 2020-05-03 NOTE — Progress Notes (Signed)
PROGRESS NOTE                                                                                                                                                                                                             Patient Demographics:    [name], is a 60 y.o. female, DOB - May 11, 1960, HLK:[id number]  Outpatient Primary MD for the patient is [facility], [facility]    LOS - 0  Admit date - 05/02/2020    No chief complaint on file.      Brief Narrative (HPI from H&P) [name] is a 60 y.o. female with medical history significant of schizo-affective schizophrenia, BPD, polysubstance abuse, HTN, has history of cocaine abuse, ongoing smoking.  Who presented to the ER with foul-smelling urine and shortness of breath, was diagnosed with UTI along with CHF and admitted to the hospital.   Subjective:    [name] today has, No headache, No chest pain, No abdominal pain - No Nausea, No new weakness tingling or numbness, improved SOB.   Assessment  & Plan :     1. Acute Hypoxic Resp. Failure due to chronic CHF, nonspecific no previous echo on file - she is currently on combination of beta-blocker along with IV Lasix, shortness of breath is improved continue to monitor.  Echo pending.  Encouraged the patient to sit up in chair in the daytime use I-S and flutter valve for pulmonary toiletry and then prone in bed when at night.  Will advance activity and titrate down oxygen as possible.  SpO2: 94 % O2 Flow Rate (L/min): 2 L/min  2.  UTI.  For now on Rocephin, follow cultures.  3.  Essential hypertension.  On combination of Norvasc, labetalol along with as needed hydralazine.  Monitor.  4.  History of polysubstance abuse.  Urine drug screen clear, per patient now she is only smoking and she has quit alcohol and cocaine use.  Counseled to quit all and continue to abstain.  5.  History of schizoaffective  disorder.  On Seroquel which will be continued.  Outpatient follow-up with psych post discharge.  Not suicidal or homicidal.       Condition -   Guarded  Family Communication  : Son Ethelene Browns [phone]( office answering machine) [phone] -  on 05/03/2020 message left at 9:41 AM.  Code  Status :  Full  Consults  :  None  PUD Prophylaxis : Pepcid   Procedures  :     TTE      Disposition Plan  :    Status is: Inpatient  Remains inpatient appropriate because:IV treatments appropriate due to intensity of illness or inability to take PO   Dispo: The patient is from: Home              Anticipated d/c is to: Home              Anticipated d/c date is: 3 days              Patient currently is not medically stable to d/c.   Difficult to place patient No  DVT Prophylaxis  :  Lovenox    Lab Results  Component Value Date   PLT 311 05/02/2020    Diet :  Diet Order            Diet heart healthy/carb modified Room service appropriate? Yes; Fluid consistency: Thin  Diet effective now                  Inpatient Medications  Scheduled Meds: . amLODipine  5 mg Oral Daily  . enoxaparin (LOVENOX) injection  40 mg Subcutaneous Q24H  . famotidine  20 mg Oral BID  . furosemide  40 mg Intravenous Daily  . insulin aspart  0-15 Units Subcutaneous TID WC  . insulin aspart  0-5 Units Subcutaneous QHS  . labetalol  100 mg Oral BID  . nicotine  21 mg Transdermal Daily  . QUEtiapine  100 mg Oral QHS  . sodium chloride flush  3 mL Intravenous Q12H   Continuous Infusions: . sodium chloride    . [START ON 05/04/2020] cefTRIAXone (ROCEPHIN)  IV     PRN Meds:.sodium chloride, acetaminophen, hydrALAZINE, ondansetron (ZOFRAN) IV, sodium chloride flush  Antibiotics  :    Anti-infectives (From admission, onward)   Start     Dose/Rate Route Frequency Ordered Stop   05/04/20 0600  cefTRIAXone (ROCEPHIN) 1 g in sodium chloride 0.9 % 100 mL IVPB        1 g 200 mL/hr over 30 Minutes  Intravenous Daily 05/03/20 0132     05/03/20 0115  cefTRIAXone (ROCEPHIN) 1 g in sodium chloride 0.9 % 100 mL IVPB        1 g 200 mL/hr over 30 Minutes Intravenous  Once 05/03/20 0108 05/03/20 0328       Time Spent in minutes  30   Susa Raring M.D on 05/03/2020 at 9:33 AM  To page go to [email or web address]   [facility] -  Office  [phone]    See all Orders from today for further details    Objective:   Vitals:   05/03/20 0203 05/03/20 0204 05/03/20 0504 05/03/20 0715  BP: (!) 139/93  (!) 150/64 132/85  Pulse: 89  87 (!) 108  Resp: 17  15 (!) 24  Temp:      TempSrc:      SpO2: 98%  98% 94%  Weight:  115.2 kg    Height:  5\' 7"  (1.702 m)      Wt Readings from Last 3 Encounters:  05/03/20 115.2 kg  07/18/19 108.6 kg  06/21/19 130 kg     Intake/Output Summary (Last 24 hours) at 05/03/2020 0933 Last data filed at 05/03/2020 0328 Gross per 24 hour  Intake 101.2 ml  Output --  Net 101.2 ml  Physical Exam  Awake Alert, No new F.N deficits, Normal affect .AT,PERRAL Supple Neck,No JVD, No cervical lymphadenopathy appriciated.  Symmetrical Chest wall movement, Good air movement bilaterally, CTAB RRR,No Gallops,Rubs or new Murmurs, No Parasternal Heave +ve B.Sounds, Abd Soft, No tenderness, No organomegaly appriciated, No rebound - guarding or rigidity. No Cyanosis, Clubbing or edema, No new Rash or bruise       Data Review:    CBC Recent Labs  Lab 05/02/20 1343  WBC 5.9  HGB 14.2  HCT 46.4*  PLT 311  MCV 90.1  MCH 27.6  MCHC 30.6  RDW 14.6    Recent Labs  Lab 05/02/20 1343 05/03/20 0146 05/03/20 0220 05/03/20 0723  NA 140  --   --   --   K 4.1  --   --   --   CL 98  --   --   --   CO2 31  --   --   --   GLUCOSE 149*  --   --   --   BUN 5*  --   --   --   CREATININE 0.73  --   --   --   CALCIUM 9.3  --   --   --   MG  --   --   --  1.8  DDIMER  --   --   --  0.38  PROCALCITON  --   --   --  <0.10  HGBA1C  --  7.9*  --   --    BNP  --   --  57.7  --     ------------------------------------------------------------------------------------------------------------------ No results for input(s): CHOL, HDL, LDLCALC, TRIG, CHOLHDL, LDLDIRECT in the last 72 hours.  Lab Results  Component Value Date   HGBA1C 7.9 (H) 05/03/2020   ------------------------------------------------------------------------------------------------------------------ No results for input(s): TSH, T4TOTAL, T3FREE, THYROIDAB in the last 72 hours.  Invalid input(s): FREET3  Cardiac Enzymes No results for input(s): CKMB, TROPONINI, MYOGLOBIN in the last 168 hours.  Invalid input(s): CK ------------------------------------------------------------------------------------------------------------------    Component Value Date/Time   BNP 57.7 05/03/2020 0220    Micro Results Recent Results (from the past 240 hour(s))  Resp Panel by RT-PCR (Flu A&B, Covid) Nasopharyngeal Swab     Status: None   Collection Time: 05/03/20  2:20 AM   Specimen: Nasopharyngeal Swab; Nasopharyngeal(NP) swabs in vial transport medium  Result Value Ref Range Status   SARS Coronavirus 2 by RT PCR NEGATIVE NEGATIVE Final    Comment: (NOTE) SARS-CoV-2 target nucleic acids are NOT DETECTED.  The SARS-CoV-2 RNA is generally detectable in upper respiratory specimens during the acute phase of infection. The lowest concentration of SARS-CoV-2 viral copies this assay can detect is 138 copies/mL. A negative result does not preclude SARS-Cov-2 infection and should not be used as the sole basis for treatment or other patient management decisions. A negative result may occur with  improper specimen collection/handling, submission of specimen other than nasopharyngeal swab, presence of viral mutation(s) within the areas targeted by this assay, and inadequate number of viral copies(<138 copies/mL). A negative result must be combined with clinical observations, patient  history, and epidemiological information. The expected result is Negative.  Fact Sheet for Patients:  BloggerCourse.com  Fact Sheet for Healthcare Providers:  SeriousBroker.it  This test is no t yet approved or cleared by the [place] FDA and  has been authorized for detection and/or diagnosis of SARS-CoV-2 by FDA under an Emergency Use Authorization (EUA). This EUA will remain  in effect (meaning this test can be used) for the duration of the COVID-19 declaration under Section 564(b)(1) of the Act, 21 U.S.C.section 360bbb-3(b)(1), unless the authorization is terminated  or revoked sooner.       Influenza A by PCR NEGATIVE NEGATIVE Final   Influenza B by PCR NEGATIVE NEGATIVE Final    Comment: (NOTE) The Xpert Xpress SARS-CoV-2/FLU/RSV plus assay is intended as an aid in the diagnosis of influenza from Nasopharyngeal swab specimens and should not be used as a sole basis for treatment. Nasal washings and aspirates are unacceptable for Xpert Xpress SARS-CoV-2/FLU/RSV testing.  Fact Sheet for Patients: BloggerCourse.comhttps://www.fda.gov/media/152166/download  Fact Sheet for Healthcare Providers: SeriousBroker.ithttps://www.fda.gov/media/152162/download  This test is not yet approved or cleared by the [place] FDA and has been authorized for detection and/or diagnosis of SARS-CoV-2 by FDA under an Emergency Use Authorization (EUA). This EUA will remain in effect (meaning this test can be used) for the duration of the COVID-19 declaration under Section 564(b)(1) of the Act, 21 U.S.C. section 360bbb-3(b)(1), unless the authorization is terminated or revoked.  Performed at [facility], [address] [place], [place] [address]     Radiology Reports DG Chest 2 View  Result Date: 05/02/2020 CLINICAL DATA:  Tachycardia. EXAM: CHEST - 2 VIEW COMPARISON:  06/29/2019 and prior studies FINDINGS: Cardiomegaly and pulmonary vascular congestion  noted. Mild interstitial opacities are present. No pleural effusion or pneumothorax. No acute bony abnormalities are identified. IMPRESSION: Cardiomegaly with pulmonary vascular congestion and possible mild interstitial edema. Electronically Signed   By: Harmon PierJeffrey  Hu M.D.   On: 05/02/2020 13:45

## 2020-05-03 NOTE — ED Notes (Signed)
Lunch Tray Ordered @ 1016. 

## 2020-05-03 NOTE — Progress Notes (Signed)
Pt had home meds in her purse at bedside. Pt refused to allow RN to send medications to pharmacy.  Pt agreed to allow RN to place medication bottles into sealed bag and place with her belongings.  Medication bottles of gabapentin, paliperidone, & solifenacin placed sealed in bag. Witnessed by Duffy Bruce RN. Reznor Ferrando, Yancey Flemings, RN

## 2020-05-03 NOTE — ED Notes (Signed)
Patient pulled purewick off incont in bed. Linens changed and new purewick applied. Educated patient to please leaave purewick in place.

## 2020-05-03 NOTE — ED Notes (Signed)
Report given to [facility] 

## 2020-05-03 NOTE — ED Notes (Signed)
Patient given blankets and food per request.

## 2020-05-03 NOTE — ED Notes (Signed)
Breakfast ordered 

## 2020-05-04 DIAGNOSIS — I10 Essential (primary) hypertension: Secondary | ICD-10-CM | POA: Diagnosis not present

## 2020-05-04 DIAGNOSIS — N390 Urinary tract infection, site not specified: Secondary | ICD-10-CM

## 2020-05-04 DIAGNOSIS — I509 Heart failure, unspecified: Secondary | ICD-10-CM

## 2020-05-04 DIAGNOSIS — F250 Schizoaffective disorder, bipolar type: Secondary | ICD-10-CM

## 2020-05-04 DIAGNOSIS — F1910 Other psychoactive substance abuse, uncomplicated: Secondary | ICD-10-CM | POA: Diagnosis not present

## 2020-05-04 LAB — CBC WITH DIFFERENTIAL/PLATELET
Abs Immature Granulocytes: 0.02 10*3/uL (ref 0.00–0.07)
Basophils Absolute: 0 10*3/uL (ref 0.0–0.1)
Basophils Relative: 1 %
Eosinophils Absolute: 0.1 10*3/uL (ref 0.0–0.5)
Eosinophils Relative: 3 %
HCT: 46.2 % — ABNORMAL HIGH (ref 36.0–46.0)
Hemoglobin: 14.2 g/dL (ref 12.0–15.0)
Immature Granulocytes: 0 %
Lymphocytes Relative: 30 %
Lymphs Abs: 1.5 10*3/uL (ref 0.7–4.0)
MCH: 28 pg (ref 26.0–34.0)
MCHC: 30.7 g/dL (ref 30.0–36.0)
MCV: 90.9 fL (ref 80.0–100.0)
Monocytes Absolute: 0.5 10*3/uL (ref 0.1–1.0)
Monocytes Relative: 11 %
Neutro Abs: 2.8 10*3/uL (ref 1.7–7.7)
Neutrophils Relative %: 55 %
Platelets: 276 10*3/uL (ref 150–400)
RBC: 5.08 MIL/uL (ref 3.87–5.11)
RDW: 14.6 % (ref 11.5–15.5)
WBC: 4.9 10*3/uL (ref 4.0–10.5)
nRBC: 0 % (ref 0.0–0.2)

## 2020-05-04 LAB — COMPREHENSIVE METABOLIC PANEL
ALT: 20 U/L (ref 0–44)
AST: 16 U/L (ref 15–41)
Albumin: 3.3 g/dL — ABNORMAL LOW (ref 3.5–5.0)
Alkaline Phosphatase: 55 U/L (ref 38–126)
Anion gap: 10 (ref 5–15)
BUN: 16 mg/dL (ref 6–20)
CO2: 32 mmol/L (ref 22–32)
Calcium: 9 mg/dL (ref 8.9–10.3)
Chloride: 98 mmol/L (ref 98–111)
Creatinine, Ser: 0.75 mg/dL (ref 0.44–1.00)
GFR, Estimated: >60 mL/min (ref >60)
Glucose, Bld: 174 mg/dL — ABNORMAL HIGH (ref 70–99)
Potassium: 4.3 mmol/L (ref 3.5–5.1)
Sodium: 140 mmol/L (ref 135–145)
Total Bilirubin: 0.7 mg/dL (ref 0.3–1.2)
Total Protein: 6.9 g/dL (ref 6.5–8.1)

## 2020-05-04 LAB — GLUCOSE, CAPILLARY
Glucose-Capillary: 164 mg/dL — ABNORMAL HIGH (ref 70–99)
Glucose-Capillary: 199 mg/dL — ABNORMAL HIGH (ref 70–99)
Glucose-Capillary: 171 mg/dL — ABNORMAL HIGH (ref 70–99)
Glucose-Capillary: 157 mg/dL — ABNORMAL HIGH (ref 70–99)

## 2020-05-04 LAB — BRAIN NATRIURETIC PEPTIDE: B Natriuretic Peptide: 44.6 pg/mL (ref 0.0–100.0)

## 2020-05-04 LAB — C-REACTIVE PROTEIN: CRP: 1.3 mg/dL — ABNORMAL HIGH (ref <1.0)

## 2020-05-04 LAB — MAGNESIUM: Magnesium: 2 mg/dL (ref 1.7–2.4)

## 2020-05-04 LAB — D-DIMER, QUANTITATIVE: D-Dimer, Quant: 0.49 ug/mL-FEU (ref 0.00–0.50)

## 2020-05-04 LAB — PROCALCITONIN: Procalcitonin: <0.1 ng/mL

## 2020-05-04 MED ORDER — TRAMADOL HCL 50 MG PO TABS
50.0000 mg | ORAL_TABLET | Freq: Three times a day (TID) | ORAL | Status: DC | PRN
  Administered 2020-05-05 (×2): 50 mg via ORAL
  Filled 2020-05-04 (×4): qty 1

## 2020-05-04 NOTE — TOC Initial Note (Signed)
Transition of Care [facility]) - Initial/Assessment Note    Patient Details  Name: [name] MRN: [id number] Date of Birth: 28-Feb-1961  Transition of [facility]) CM/SW Contact:    Lanier Clam, RN Phone Number: 05/04/2020, 4:13 PM  Clinical Narrative: Referral for etoh resources. Spoke to patient about d/c plan-she is Active w/ACT Team left vm for a call back from Jens Som or [employer] @ tel#[phone] or Bridgett [phone]-await call back spoke to [place]. Patient states she was evicted from [employer] she has been approved for apt in [place] await to hear back from ACT team-John or Bridgett.Patient taes she wants to detox for substance abuse-informed her that MD would need assess for her treatment plan.Continue to follow.                  Expected Discharge Plan: Homeless Shelter Barriers to Discharge: Continued Medical Work up   Patient Goals and CMS Choice Patient states their goals for this hospitalization and ongoing recovery are:: go home CMS Medicare.gov Compare Post Acute Care list provided to:: Patient    Expected Discharge Plan and Services Expected Discharge Plan: Homeless Shelter   Discharge Planning Services: CM Consult   Living arrangements for the past 2 months: Homeless                                      Prior Living Arrangements/Services Living arrangements for the past 2 months: Homeless Lives with:: Self Patient language and need for interpreter reviewed:: Yes Do you feel safe going back to the place where you live?: Yes      Need for Family Participation in Patient Care: No (Comment) Care giver support system in place?: Yes (comment)   Criminal Activity/Legal Involvement Pertinent to Current Situation/Hospitalization: No - Comment as needed  Activities of Daily Living Home Assistive Devices/Equipment: Cane (specify quad or straight) ADL Screening (condition at time of admission) Patient's cognitive ability adequate  to safely complete daily activities?: Yes Is the patient deaf or have difficulty hearing?: No Does the patient have difficulty seeing, even when wearing glasses/contacts?: No Does the patient have difficulty concentrating, remembering, or making decisions?: Yes Patient able to express need for assistance with ADLs?: Yes Does the patient have difficulty dressing or bathing?: No Independently performs ADLs?: Yes (appropriate for developmental age) Does the patient have difficulty walking or climbing stairs?: No Weakness of Legs: Both Weakness of Arms/Hands: None  Permission Sought/Granted Permission sought to share information with : Case Manager Permission granted to share information with : Yes, Verbal Permission Granted  Share Information with NAME: Case Manager           Emotional Assessment Appearance:: Appears stated age Attitude/Demeanor/Rapport: Gracious Affect (typically observed): Accepting Orientation: : Oriented to Place,Oriented to  Time,Oriented to Situation Alcohol / Substance Use: Illicit Drugs Psych Involvement: Yes (comment)  Admission diagnosis:  CHF (congestive heart failure) (HCC) [I50.9] Acute pulmonary edema (HCC) [J81.0] Urinary tract infection without hematuria, site unspecified [N39.0] Altered mental status, unspecified altered mental status type [R41.82] New onset of congestive heart failure (HCC) [I50.9] Patient Active Problem List   Diagnosis Date Noted  . Acute lower UTI 05/03/2020  . New onset of congestive heart failure (HCC) 05/03/2020  . CHF (congestive heart failure) (HCC) 05/03/2020  . Airway compromise   . Acute respiratory failure (HCC)   . Tracheostomy status (HCC)   . Angioedema 06/28/2019  .  Substance induced mood disorder (HCC) 12/27/2015  . Fever, unspecified 09/13/2015  . Leukocytosis 09/13/2015  . Acute encephalopathy 09/13/2015  . Substance abuse (HCC) 09/12/2015  . Hallucination   . Homicidal ideation   . Schizophrenia,  unspecified type (HCC)   . Schizoaffective disorder, bipolar type (HCC)   . Schizoaffective disorder (HCC) 03/29/2013  . Cocaine abuse (HCC) 06/30/2011  . Non-compliant patient 06/29/2011  . Hypertension 06/29/2011  . Alcohol dependence (HCC) 06/28/2011  . Polysubstance dependence (HCC) 04/03/2011  . Psychiatric problem 04/03/2011  . Polysubstance abuse (HCC) 06/21/2010  . Schizophrenia (HCC) 06/21/2010   PCP:  Medicine, [facility]:   [facility] - [place], [place] - [address] [facility] [address] [place] [address] Phone: [phone] Fax: [phone]  [facility] - [place], [place] - [address] [place] [address] Phone: [phone] Fax: [phone]     Social Determinants of Health (SDOH) Interventions    Readmission Risk Interventions No flowsheet data found.

## 2020-05-04 NOTE — Evaluation (Addendum)
Physical Therapy Evaluation-1x Patient Details Name: [name] MRN: [id number] DOB: 18-Jun-1960 Today's Date: 05/04/2020   History of Present Illness  60 yo female admitted with CHF. Hx of schizoaffective d/o, CHF, obesity, angioedema, polysubstance abuse, bipolar d/o  Clinical Impression  On eval, pt was Supv-Mod I with mobility. She walked ~150 feet around the unit. No LOB during session. She c/o mild lightheadedness but participated well. No acute PT needs. 1x eval. Will sign off. Nursing can mobilize as necessary.     Follow Up Recommendations No PT follow up    Equipment Recommendations  None recommended by PT    Recommendations for Other Services       Precautions / Restrictions Precautions Precautions: Fall Precaution Comments: impulsive and unpredictable at times Restrictions Weight Bearing Restrictions: No      Mobility  Bed Mobility Overal bed mobility: Modified Independent                  Transfers Overall transfer level: Modified independent                  Ambulation/Gait Ambulation/Gait assistance: Supervision (Device/Increase time) Gait Distance (Feet): 150 Feet Assistive device: Rolling walker (2 wheeled);None       General Gait Details: pt asked to use walker but she did not need it. she actually left it to walk over to speak with NT-no difficulty or LOB. She c/o mild lightheadedness.  Stairs            Wheelchair Mobility    Modified Rankin (Stroke Patients Only)       Balance Overall balance assessment: Mild deficits observed, not formally tested                                           Pertinent Vitals/Pain Pain Assessment: No/denies pain    Home Living Family/patient expects to be discharged to:: Shelter/Homeless (motel per pt?) Living Arrangements: Alone   Type of Home: Homeless                Prior Function Level of Independence: Independent               Hand Dominance         Extremity/Trunk Assessment   Upper Extremity Assessment Upper Extremity Assessment: Overall WFL for tasks assessed    Lower Extremity Assessment Lower Extremity Assessment: Overall WFL for tasks assessed    Cervical / Trunk Assessment Cervical / Trunk Assessment: Normal  Communication      Cognition Arousal/Alertness: Awake/alert Behavior During Therapy: WFL for tasks assessed/performed Overall Cognitive Status: No family/caregiver present to determine baseline cognitive functioning                                 General Comments: impulsive and unpredictable at times. mildly irritable but participated well      General Comments      Exercises     Assessment/Plan    PT Assessment Patent does not need any further PT services  PT Problem List         PT Treatment Interventions      PT Goals (Current goals can be found in the Care Plan section)  Acute Rehab PT Goals Patient Stated Goal: to have a place to stay once d/c from hospital PT Goal Formulation: All assessment and  education complete, DC therapy    Frequency     Barriers to discharge        Co-evaluation               AM-PAC PT "6 Clicks" Mobility  Outcome Measure Help needed turning from your back to your side while in a flat bed without using bedrails?: None Help needed moving from lying on your back to sitting on the side of a flat bed without using bedrails?: None Help needed moving to and from a bed to a chair (including a wheelchair)?: None Help needed standing up from a chair using your arms (e.g., wheelchair or bedside chair)?: None Help needed to walk in hospital room?: None Help needed climbing 3-5 steps with a railing? : A Little 6 Click Score: 23    End of Session Equipment Utilized During Treatment: Gait belt Activity Tolerance: Patient tolerated treatment well Patient left: in bed;with call bell/phone within reach;with bed alarm set        Time:  8032-1224 PT Time Calculation (min) (ACUTE ONLY): 19 min   Charges:   PT Evaluation $PT Eval Moderate Complexity: 1 Mod           Faye Ramsay, PT Acute Rehabilitation  Office: [phone] Pager: [phone]

## 2020-05-04 NOTE — Plan of Care (Signed)
  Problem: Health Behavior/Discharge Planning: Goal: Ability to manage health-related needs will improve Outcome: Progressing   Problem: Nutrition: Goal: Adequate nutrition will be maintained Outcome: Progressing   Problem: Coping: Goal: Level of anxiety will decrease Outcome: Progressing   Problem: Safety: Goal: Ability to remain free from injury will improve Outcome: Progressing   

## 2020-05-04 NOTE — Progress Notes (Signed)
PT Cancellation Note  Patient Details Name: [name] MRN: [id number] DOB: 1960/10/23   Cancelled Treatment:    Reason Eval/Treat Not Completed:  2 attempts to work with pt this morning. Pt declined to participate and requested PT check back later. Will check back as schedule allows. Pt is also requesting to speak with a CM.    Faye Ramsay, PT Acute Rehabilitation  Office: [phone] Pager: [phone]

## 2020-05-04 NOTE — Progress Notes (Signed)
PROGRESS NOTE    [name]  BMW:[id number] DOB: [birth year] DOA: 05/02/2020 PCP: Medicine, [facility]    No chief complaint on file.   Brief Narrative:  [name] a 60 y.o.femalewith medical history significant ofschizo-affective schizophrenia, BPD, polysubstance abuse, HTN, has history of cocaine abuse, ongoing smoking.  Who presented to the ER with foul-smelling urine and shortness of breath, was diagnosed with UTI along with CHF and admitted to the hospital   Assessment & Plan:   Principal Problem:   New onset of congestive heart failure (HCC) Active Problems:   Polysubstance abuse (HCC)   Non-compliant patient   Hypertension   Cocaine abuse (HCC)   Schizoaffective disorder, bipolar type (HCC)   Acute lower UTI   CHF (congestive heart failure) (HCC)  1 acute hypoxic respiratory failure secondary to acute diastolic CHF exacerbation Patient presenting with worsening shortness of breath.  Patient admitted with concerns for acute diastolic CHF based on chest x-ray findings, physical exam.  Patient on IV Lasix with urine output of 750 cc over the past 24 hours.  Renal 9.3 kg from 115.2 kg on admission..  Continue IV Lasix and likely transition to oral Lasix in the next 24 hours.  Continue labetalol.  Outpatient follow-up.  2.  UTI Urine cultures pending.  Continue IV Rocephin.  Follow.  3.  Hypertension Improving on current regimen of Norvasc, labetalol, IV Lasix.  Follow and uptitrate Norvasc if needed for better blood pressure control.  4.  History of polysubstance abuse UDS negative.  Patient did state only smoking cigarettes and has quit using alcohol or cocaine.  Cessation stressed to patient.  5.  History of schizoaffective disorder Not homicidal or suicidal.  Continue Seroquel.  Outpatient follow-up with psychiatry.     DVT prophylaxis: Lovenox Code Status: Full Family Communication: Updated patient.  No family at bedside. Disposition:    Status is: Inpatient    Dispo: The patient is from: [facility]              Anticipated d/c is to: To be determined              Anticipated d/c date is: 1 to 2 days              Patient currently on IV Lasix not stable for discharge.   Difficult to place patient yes       Consultants:   None  Procedures:   2D echo 05/03/2020  Chest x-ray 05/02/2020  Antimicrobials:   IV Rocephin 05/03/2020>>>>   Subjective: Sitting up in chair.  Stated he has had a bowel movement and waiting for it to be cleaned.  States shortness of breath is improving.  Denies any chest pain.  Denies any significant dysuria.  Patient preoccupied with losing her hotel room.  Objective: Vitals:   05/03/20 1810 05/03/20 2130 05/04/20 0500 05/04/20 0632  BP: (!) 145/91 132/82  (!) 137/22  Pulse: 92 90  64  Resp: (!) 22 20    Temp: 98.1 F (36.7 C) 98.7 F (37.1 C)  97.8 F (36.6 C)  TempSrc: Oral Oral  Axillary  SpO2: 99% 91%  98%  Weight:   109.3 kg   Height:        Intake/Output Summary (Last 24 hours) at 05/04/2020 1302 Last data filed at 05/04/2020 0900 Gross per 24 hour  Intake 480 ml  Output 1750 ml  Net -1270 ml   Filed Weights   05/03/20 0204 05/03/20 1243 05/04/20 0500  Weight: 115.2 kg 109.4 kg 109.3 kg    Examination:  General exam: Appears calm and comfortable  Respiratory system: Decreased breath sounds in the bases.  No wheezing.  Fair air movement.  Speaking in full sentences. Cardiovascular system: S1 & S2 heard, RRR. No JVD, murmurs, rubs, gallops or clicks. No pedal edema. Gastrointestinal system: Abdomen is nondistended, soft and nontender. No organomegaly or masses felt. Normal bowel sounds heard. Central nervous system: Alert and oriented. No focal neurological deficits. Extremities: Symmetric 5 x 5 power. Skin: No rashes, lesions or ulcers Psychiatry: Judgement and insight appear normal. Mood & affect appropriate.     Data Reviewed: I have personally reviewed  following labs and imaging studies  CBC: Recent Labs  Lab 05/02/20 1343 05/04/20 0514  WBC 5.9 4.9  NEUTROABS  --  2.8  HGB 14.2 14.2  HCT 46.4* 46.2*  MCV 90.1 90.9  PLT 311 276    Basic Metabolic Panel: Recent Labs  Lab 05/02/20 1343 05/03/20 0723 05/04/20 0514  NA 140  --  140  K 4.1  --  4.3  CL 98  --  98  CO2 31  --  32  GLUCOSE 149*  --  174*  BUN 5*  --  16  CREATININE 0.73  --  0.75  CALCIUM 9.3  --  9.0  MG  --  1.8 2.0    GFR: Estimated Creatinine Clearance: 97.7 mL/min (by C-G formula based on SCr of 0.75 mg/dL).  Liver Function Tests: Recent Labs  Lab 05/04/20 0514  AST 16  ALT 20  ALKPHOS 55  BILITOT 0.7  PROT 6.9  ALBUMIN 3.3*    CBG: Recent Labs  Lab 05/03/20 1421 05/03/20 1755 05/03/20 2126 05/04/20 0752 05/04/20 1127  GLUCAP 172* 144* 214* 164* 199*     Recent Results (from the past 240 hour(s))  Resp Panel by RT-PCR (Flu A&B, Covid) Nasopharyngeal Swab     Status: None   Collection Time: 05/03/20  2:20 AM   Specimen: Nasopharyngeal Swab; Nasopharyngeal(NP) swabs in vial transport medium  Result Value Ref Range Status   SARS Coronavirus 2 by RT PCR NEGATIVE NEGATIVE Final    Comment: (NOTE) SARS-CoV-2 target nucleic acids are NOT DETECTED.  The SARS-CoV-2 RNA is generally detectable in upper respiratory specimens during the acute phase of infection. The lowest concentration of SARS-CoV-2 viral copies this assay can detect is 138 copies/mL. A negative result does not preclude SARS-Cov-2 infection and should not be used as the sole basis for treatment or other patient management decisions. A negative result may occur with  improper specimen collection/handling, submission of specimen other than nasopharyngeal swab, presence of viral mutation(s) within the areas targeted by this assay, and inadequate number of viral copies(<138 copies/mL). A negative result must be combined with clinical observations, patient history, and  epidemiological information. The expected result is Negative.  Fact Sheet for Patients:  BloggerCourse.com  Fact Sheet for Healthcare Providers:  SeriousBroker.it  This test is no t yet approved or cleared by the [place] FDA and  has been authorized for detection and/or diagnosis of SARS-CoV-2 by FDA under an Emergency Use Authorization (EUA). This EUA will remain  in effect (meaning this test can be used) for the duration of the COVID-19 declaration under Section 564(b)(1) of the Act, 21 U.S.C.section 360bbb-3(b)(1), unless the authorization is terminated  or revoked sooner.       Influenza A by PCR NEGATIVE NEGATIVE Final   Influenza B by PCR NEGATIVE NEGATIVE  Final    Comment: (NOTE) The Xpert Xpress SARS-CoV-2/FLU/RSV plus assay is intended as an aid in the diagnosis of influenza from Nasopharyngeal swab specimens and should not be used as a sole basis for treatment. Nasal washings and aspirates are unacceptable for Xpert Xpress SARS-CoV-2/FLU/RSV testing.  Fact Sheet for Patients: BloggerCourse.com  Fact Sheet for Healthcare Providers: SeriousBroker.it  This test is not yet approved or cleared by the [place] FDA and has been authorized for detection and/or diagnosis of SARS-CoV-2 by FDA under an Emergency Use Authorization (EUA). This EUA will remain in effect (meaning this test can be used) for the duration of the COVID-19 declaration under Section 564(b)(1) of the Act, 21 U.S.C. section 360bbb-3(b)(1), unless the authorization is terminated or revoked.  Performed at [facility], [address], [place], [place] [address]   Blood culture (routine x 2)     Status: None (Preliminary result)   Collection Time: 05/03/20  2:20 AM   Specimen: BLOOD  Result Value Ref Range Status   Specimen Description BLOOD RIGHT ARM  Final   Special Requests   Final     BOTTLES DRAWN AEROBIC AND ANAEROBIC Blood Culture adequate volume   Culture   Final    NO GROWTH 1 DAY Performed at [facility], [address] [place], [place] [address]    Report Status PENDING  Incomplete  Blood culture (routine x 2)     Status: None (Preliminary result)   Collection Time: 05/03/20  2:20 AM   Specimen: BLOOD  Result Value Ref Range Status   Specimen Description BLOOD LEFT ARM  Final   Special Requests   Final    BOTTLES DRAWN AEROBIC AND ANAEROBIC Blood Culture adequate volume   Culture   Final    NO GROWTH 1 DAY Performed at [facility], [address], [place], [place] [address]    Report Status PENDING  Incomplete         Radiology Studies: DG Chest 2 View  Result Date: 05/02/2020 CLINICAL DATA:  Tachycardia. EXAM: CHEST - 2 VIEW COMPARISON:  06/29/2019 and prior studies FINDINGS: Cardiomegaly and pulmonary vascular congestion noted. Mild interstitial opacities are present. No pleural effusion or pneumothorax. No acute bony abnormalities are identified. IMPRESSION: Cardiomegaly with pulmonary vascular congestion and possible mild interstitial edema. Electronically Signed   By: Harmon Pier M.D.   On: 05/02/2020 13:45   ECHOCARDIOGRAM COMPLETE  Result Date: 05/03/2020    ECHOCARDIOGRAM REPORT   Patient Name:   [name] Date of Exam: 05/03/2020 Medical Rec #:  [id number]    Height:       67.0 in Accession #:    [id number]   Weight:       241.2 lb Date of Birth:  1960-04-14    BSA:          2.190 m Patient Age:    59 years     BP:           148/86 mmHg Patient Gender: F            HR:           90 bpm. Exam Location:  Inpatient Procedure: 2D Echo, Color Doppler and Cardiac Doppler Indications:    CHF-Acute Diastolic I50.31  History:        Patient has no prior history of Echocardiogram examinations.                 Risk Factors:Hypertension.  Sonographer:  Eulah PontSarah Pirrotta RDCS Referring Phys: [phone] JARED M GARDNER  Sonographer Comments:  Technically difficult study due to poor echo windows. IMPRESSIONS  1. Left ventricular ejection fraction, by estimation, is 65 to 70%. The left ventricle has normal function. The left ventricle has no regional wall motion abnormalities. Left ventricular diastolic parameters are consistent with Grade I diastolic dysfunction (impaired relaxation).  2. Right ventricular systolic function is normal. The right ventricular size is normal. Tricuspid regurgitation signal is inadequate for assessing PA pressure.  3. The mitral valve is normal in structure. Trivial mitral valve regurgitation. No evidence of mitral stenosis.  4. The aortic valve was not well visualized. Aortic valve regurgitation is not visualized. No aortic stenosis is present.  5. The inferior vena cava is normal in size with greater than 50% respiratory variability, suggesting right atrial pressure of 3 mmHg. FINDINGS  Left Ventricle: Left ventricular ejection fraction, by estimation, is 65 to 70%. The left ventricle has normal function. The left ventricle has no regional wall motion abnormalities. The left ventricular internal cavity size was normal in size. There is  no left ventricular hypertrophy. Left ventricular diastolic parameters are consistent with Grade I diastolic dysfunction (impaired relaxation). Right Ventricle: The right ventricular size is normal. No increase in right ventricular wall thickness. Right ventricular systolic function is normal. Tricuspid regurgitation signal is inadequate for assessing PA pressure. Left Atrium: Left atrial size was normal in size. Right Atrium: Right atrial size was normal in size. Pericardium: There is no evidence of pericardial effusion. Mitral Valve: The mitral valve is normal in structure. Trivial mitral valve regurgitation. No evidence of mitral valve stenosis. Tricuspid Valve: The tricuspid valve is normal in structure. Tricuspid valve regurgitation is trivial. No evidence of tricuspid stenosis. Aortic  Valve: The aortic valve was not well visualized. Aortic valve regurgitation is not visualized. No aortic stenosis is present. Pulmonic Valve: The pulmonic valve was not well visualized. Pulmonic valve regurgitation is not visualized. No evidence of pulmonic stenosis. Aorta: The aortic root is normal in size and structure. Venous: The inferior vena cava is normal in size with greater than 50% respiratory variability, suggesting right atrial pressure of 3 mmHg. IAS/Shunts: No atrial level shunt detected by color flow Doppler.  LEFT VENTRICLE PLAX 2D LVIDd:         5.09 cm  Diastology LVIDs:         3.46 cm  LV e' medial:    5.10 cm/s LV PW:         1.06 cm  LV E/e' medial:  11.6 LV IVS:        0.93 cm  LV e' lateral:   5.48 cm/s LVOT diam:     1.70 cm  LV E/e' lateral: 10.8 LV SV:         69 LV SV Index:   32 LVOT Area:     2.27 cm  RIGHT VENTRICLE RV S prime:     10.80 cm/s TAPSE (M-mode): 1.9 cm LEFT ATRIUM         Index LA diam:    4.40 cm 2.01 cm/m  AORTIC VALVE LVOT Vmax:   168.00 cm/s LVOT Vmean:  125.000 cm/s LVOT VTI:    0.305 m  AORTA Ao Root diam: 3.00 cm Ao Asc diam:  2.90 cm MITRAL VALVE MV Area (PHT): 2.20 cm    SHUNTS MV Decel Time: 345 msec    Systemic VTI:  0.30 m MV E velocity: 59.10 cm/s  Systemic Diam: 1.70 cm MV  A velocity: 68.10 cm/s MV E/A ratio:  0.87 Weston Brass MD Electronically signed by Weston Brass MD Signature Date/Time: 05/03/2020/2:43:15 PM    Final         Scheduled Meds: . amLODipine  5 mg Oral Daily  . enoxaparin (LOVENOX) injection  40 mg Subcutaneous Q24H  . famotidine  20 mg Oral BID  . furosemide  40 mg Intravenous Daily  . insulin aspart  0-15 Units Subcutaneous TID WC  . insulin aspart  0-5 Units Subcutaneous QHS  . labetalol  100 mg Oral BID  . nicotine  21 mg Transdermal Daily  . QUEtiapine  100 mg Oral QHS  . sodium chloride flush  3 mL Intravenous Q12H   Continuous Infusions: . sodium chloride    . cefTRIAXone (ROCEPHIN)  IV 1 g (05/04/20 0630)      LOS: 1 day    Time spent: 35 minutes    Ramiro Harvest, MD [facility]   To contact the attending provider between 7A-7P or the covering provider during after hours 7P-7A, please log into the web site [email or web address] and access using universal [place] password for that web site. If you do not have the password, please call the hospital operator.  05/04/2020, 1:02 PM

## 2020-05-04 NOTE — Progress Notes (Signed)
Tele removed by pt a 3rd. Went entering room to place tele back on pt, pt refuses to roll over and allow [place] to place leads on. provider notified. Will continue to monitor and reassess pt wishes.

## 2020-05-05 DIAGNOSIS — I10 Essential (primary) hypertension: Secondary | ICD-10-CM | POA: Diagnosis not present

## 2020-05-05 DIAGNOSIS — I509 Heart failure, unspecified: Secondary | ICD-10-CM | POA: Diagnosis not present

## 2020-05-05 DIAGNOSIS — F1910 Other psychoactive substance abuse, uncomplicated: Secondary | ICD-10-CM | POA: Diagnosis not present

## 2020-05-05 DIAGNOSIS — N390 Urinary tract infection, site not specified: Secondary | ICD-10-CM | POA: Diagnosis not present

## 2020-05-05 LAB — BASIC METABOLIC PANEL
Anion gap: 12 (ref 5–15)
BUN: 15 mg/dL (ref 6–20)
CO2: 29 mmol/L (ref 22–32)
Calcium: 9.1 mg/dL (ref 8.9–10.3)
Chloride: 97 mmol/L — ABNORMAL LOW (ref 98–111)
Creatinine, Ser: 0.63 mg/dL (ref 0.44–1.00)
GFR, Estimated: >60 mL/min (ref >60)
Glucose, Bld: 177 mg/dL — ABNORMAL HIGH (ref 70–99)
Potassium: 4.4 mmol/L (ref 3.5–5.1)
Sodium: 138 mmol/L (ref 135–145)

## 2020-05-05 LAB — GLUCOSE, CAPILLARY
Glucose-Capillary: 219 mg/dL — ABNORMAL HIGH (ref 70–99)
Glucose-Capillary: 217 mg/dL — ABNORMAL HIGH (ref 70–99)
Glucose-Capillary: 185 mg/dL — ABNORMAL HIGH (ref 70–99)
Glucose-Capillary: 160 mg/dL — ABNORMAL HIGH (ref 70–99)

## 2020-05-05 LAB — MAGNESIUM: Magnesium: 1.9 mg/dL (ref 1.7–2.4)

## 2020-05-05 LAB — BRAIN NATRIURETIC PEPTIDE: B Natriuretic Peptide: 45.3 pg/mL (ref 0.0–100.0)

## 2020-05-05 NOTE — Plan of Care (Signed)
  Problem: Nutrition: Goal: Adequate nutrition will be maintained Outcome: Progressing   Problem: Safety: Goal: Ability to remain free from injury will improve Outcome: Progressing   Problem: Education: Goal: Ability to demonstrate management of disease process will improve Outcome: Progressing Goal: Ability to verbalize understanding of medication therapies will improve Outcome: Progressing Goal: Individualized Educational Video(s) Outcome: Progressing

## 2020-05-05 NOTE — Progress Notes (Signed)
PHARMACY - PHYSICIAN COMMUNICATION CRITICAL VALUE ALERT - BLOOD CULTURE IDENTIFICATION (BCID)  [name] is an 60 y.o. female who presented to [facility] on 05/02/2020 with a chief complaint of UTI with CHF  Assessment:  1/4 aerobic, gram + rods  Name of physician (or Provider) Contacted: [place]  Current antibiotics: ceftriaxone 1gm IV q24h  Changes to prescribed antibiotics recommended:  none  No results found for this or any previous visit.  Arley Phenix RPh 05/05/2020, 12:34 AM

## 2020-05-05 NOTE — Progress Notes (Signed)
SATURATION QUALIFICATIONS: (This note is used to comply with regulatory documentation for home oxygen)  Patient Saturations on Room Air at Rest = 98%  Patient Saturations on Room Air while Ambulating = 94%  Patient Saturations on 0 Liters of oxygen while Ambulating = 94%  Please briefly explain why patient needs home oxygen:  Patient is able to maintain adequate O2 sat when ambulating. No O2 needs at this time.

## 2020-05-05 NOTE — TOC Progression Note (Addendum)
Transition of Care [facility]) - Progression Note    Patient Details  Name: [name] MRN: [id number] Date of Birth: 10/25/1960  [facility] CM/SW Contact  [name], Olegario Messier, RN Phone Number: 05/05/2020, 1:14 PM  Clinical Narrative:  Received call from [name] Team CM-familiar w/patient-he is f/u on motel status-he may have to try to get her rm back-he will also provid her meds-he will call me back-I have informed patient, & [name] that I would have to resource of a shelter list,that she would have to contact directly for services.Continue to await call back from [place].   5p-received call back from ACT Team-rep [name] with [name] on phone-they are not able to get an apt today-they will continue to assist patient with an apt-since patient has been removed from several apts-they currently are   Checking into [facility]-but it is not confirmed. They will be able to provide her meds-CM can email d/c summary to [email or web address] in am. CM will provide patient with shelter list, & patient to go to the [facility] in am.We will provide transport.MD updated.      Expected Discharge Plan: Homeless Shelter Barriers to Discharge: Continued Medical Work up  Expected Discharge Plan and Services Expected Discharge Plan: Homeless Shelter   Discharge Planning Services: CM Consult   Living arrangements for the past 2 months: Homeless                                       Social Determinants of Health (SDOH) Interventions    Readmission Risk Interventions No flowsheet data found.

## 2020-05-05 NOTE — Progress Notes (Signed)
PROGRESS NOTE    [name]  MBW:[id number] DOB: 11/26/1960 DOA: 05/02/2020 PCP: Medicine, [facility]    No chief complaint on file.   Brief Narrative:  [name] a 60 y.o.femalewith medical history significant ofschizo-affective schizophrenia, BPD, polysubstance abuse, HTN, has history of cocaine abuse, ongoing smoking.  Who presented to the ER with foul-smelling urine and shortness of breath, was diagnosed with UTI along with CHF and admitted to the hospital   Assessment & Plan:   Principal Problem:   New onset of congestive heart failure (HCC) Active Problems:   Polysubstance abuse (HCC)   Non-compliant patient   Hypertension   Cocaine abuse (HCC)   Schizoaffective disorder, bipolar type (HCC)   Acute lower UTI   CHF (congestive heart failure) (HCC)  1 acute hypoxic respiratory failure secondary to acute diastolic CHF exacerbation Patient presented with worsening shortness of breath.  Patient admitted with concerns for acute diastolic CHF based on chest x-ray findings, physical exam.  Patient on IV Lasix with urine output of 1.8 L over the past 24 hours.  Current weight of 109 kg from 115.2 kg on admission.  2D echo with EF of 65 to 70%, grade 1 diastolic dysfunction, no wall motion abnormalities.  Clinical improvement.  Could likely transition to oral Lasix tomorrow.  Continue labetalol.  Outpatient follow-up.  2.  UTI Urine cultures pending.  Continue IV Rocephin D3/3.  Follow.  3.  Hypertension Controlled on current regimen of Norvasc, labetalol, IV Lasix.  Would likely transition IV Lasix to oral Lasix tomorrow.  4.  History of polysubstance abuse UDS negative.  Patient did state only smoking cigarettes and has quit using alcohol or cocaine.  Cessation stressed to patient.  5.  History of schizoaffective disorder Not homicidal or suicidal.  Stable.  Continue Seroquel.  Outpatient follow-up with psychiatry.       DVT prophylaxis:  Lovenox Code Status: Full Family Communication: Updated patient.  No family at bedside. Disposition:   Status is: Inpatient    Dispo: The patient is from: [facility]              Anticipated d/c is to: To be determined              Anticipated d/c date is: Hopefully tomorrow 05/06/2020              Patient currently on IV Lasix, homeless, no safe disposition at this time.    Difficult to place patient yes       Consultants:   None  Procedures:   2D echo 05/03/2020  Chest x-ray 05/02/2020  Antimicrobials:   IV Rocephin 05/03/2020>>>> 05/05/2020   Subjective: Laying in bed.  States shortness of breath has improved.  Denies any chest pain.  No abdominal pain.  Dysuria improved.  Feeling much better than on admission.   Objective: Vitals:   05/05/20 0500 05/05/20 0900 05/05/20 1300 05/05/20 1327  BP:  (!) 143/88 (!) 143/87 115/71  Pulse:  99 (!) 101 (!) 102  Resp:   (!) 22 20  Temp:  98.8 F (37.1 C) 98.6 F (37 C) 98.6 F (37 C)  TempSrc:  Oral Oral Oral  SpO2:  98% 100% 93%  Weight: 109 kg     Height:        Intake/Output Summary (Last 24 hours) at 05/05/2020 1849 Last data filed at 05/05/2020 1600 Gross per 24 hour  Intake 1560 ml  Output 1300 ml  Net 260 ml   Ceasar Mons  Weights   05/03/20 1243 05/04/20 0500 05/05/20 0500  Weight: 109.4 kg 109.3 kg 109 kg    Examination:  General exam: : NAD Respiratory system: CTA B anterior lung fields.  No wheezes, no rhonchi.  Speaking in full sentences.  Normal respiratory effort. Cardiovascular system: Regular rate and rhythm no murmurs rubs or gallops.  No JVD.  No lower extremity edema.  Gastrointestinal system: Abdomen soft, nontender, nondistended, positive bowel sounds.  No rebound.  No guarding. Central nervous system: Alert and oriented. No focal neurological deficits. Extremities: Symmetric 5 x 5 power. Skin: No rashes, lesions or ulcers Psychiatry: Judgement and insight appear normal. Mood & affect  appropriate.   Data Reviewed: I have personally reviewed following labs and imaging studies  CBC: Recent Labs  Lab 05/02/20 1343 05/04/20 0514  WBC 5.9 4.9  NEUTROABS  --  2.8  HGB 14.2 14.2  HCT 46.4* 46.2*  MCV 90.1 90.9  PLT 311 276    Basic Metabolic Panel: Recent Labs  Lab 05/02/20 1343 05/03/20 0723 05/04/20 0514 05/05/20 0533  NA 140  --  140 138  K 4.1  --  4.3 4.4  CL 98  --  98 97*  CO2 31  --  32 29  GLUCOSE 149*  --  174* 177*  BUN 5*  --  16 15  CREATININE 0.73  --  0.75 0.63  CALCIUM 9.3  --  9.0 9.1  MG  --  1.8 2.0 1.9    GFR: Estimated Creatinine Clearance: 97.5 mL/min (by C-G formula based on SCr of 0.63 mg/dL).  Liver Function Tests: Recent Labs  Lab 05/04/20 0514  AST 16  ALT 20  ALKPHOS 55  BILITOT 0.7  PROT 6.9  ALBUMIN 3.3*    CBG: Recent Labs  Lab 05/04/20 1633 05/04/20 2037 05/05/20 0724 05/05/20 1135 05/05/20 1701  GLUCAP 171* 157* 219* 217* 185*     Recent Results (from the past 240 hour(s))  Resp Panel by RT-PCR (Flu A&B, Covid) Nasopharyngeal Swab     Status: None   Collection Time: 05/03/20  2:20 AM   Specimen: Nasopharyngeal Swab; Nasopharyngeal(NP) swabs in vial transport medium  Result Value Ref Range Status   SARS Coronavirus 2 by RT PCR NEGATIVE NEGATIVE Final    Comment: (NOTE) SARS-CoV-2 target nucleic acids are NOT DETECTED.  The SARS-CoV-2 RNA is generally detectable in upper respiratory specimens during the acute phase of infection. The lowest concentration of SARS-CoV-2 viral copies this assay can detect is 138 copies/mL. A negative result does not preclude SARS-Cov-2 infection and should not be used as the sole basis for treatment or other patient management decisions. A negative result may occur with  improper specimen collection/handling, submission of specimen other than nasopharyngeal swab, presence of viral mutation(s) within the areas targeted by this assay, and inadequate number of  viral copies(<138 copies/mL). A negative result must be combined with clinical observations, patient history, and epidemiological information. The expected result is Negative.  Fact Sheet for Patients:  BloggerCourse.com  Fact Sheet for Healthcare Providers:  SeriousBroker.it  This test is no t yet approved or cleared by the [place] FDA and  has been authorized for detection and/or diagnosis of SARS-CoV-2 by FDA under an Emergency Use Authorization (EUA). This EUA will remain  in effect (meaning this test can be used) for the duration of the COVID-19 declaration under Section 564(b)(1) of the Act, 21 U.S.C.section 360bbb-3(b)(1), unless the authorization is terminated  or revoked sooner.  Influenza A by PCR NEGATIVE NEGATIVE Final   Influenza B by PCR NEGATIVE NEGATIVE Final    Comment: (NOTE) The Xpert Xpress SARS-CoV-2/FLU/RSV plus assay is intended as an aid in the diagnosis of influenza from Nasopharyngeal swab specimens and should not be used as a sole basis for treatment. Nasal washings and aspirates are unacceptable for Xpert Xpress SARS-CoV-2/FLU/RSV testing.  Fact Sheet for Patients: BloggerCourse.com  Fact Sheet for Healthcare Providers: SeriousBroker.it  This test is not yet approved or cleared by the [place] FDA and has been authorized for detection and/or diagnosis of SARS-CoV-2 by FDA under an Emergency Use Authorization (EUA). This EUA will remain in effect (meaning this test can be used) for the duration of the COVID-19 declaration under Section 564(b)(1) of the Act, 21 U.S.C. section 360bbb-3(b)(1), unless the authorization is terminated or revoked.  Performed at [facility], [address] [place], [place] [address]   Blood culture (routine x 2)     Status: None (Preliminary result)   Collection Time: 05/03/20  2:20 AM    Specimen: BLOOD  Result Value Ref Range Status   Specimen Description BLOOD RIGHT ARM  Final   Special Requests   Final    BOTTLES DRAWN AEROBIC AND ANAEROBIC Blood Culture adequate volume   Culture   Final    NO GROWTH 2 DAYS Performed at [facility], [address] [place], [place] [address]    Report Status PENDING  Incomplete  Blood culture (routine x 2)     Status: None (Preliminary result)   Collection Time: 05/03/20  2:20 AM   Specimen: BLOOD  Result Value Ref Range Status   Specimen Description BLOOD LEFT ARM  Final   Special Requests   Final    BOTTLES DRAWN AEROBIC AND ANAEROBIC Blood Culture adequate volume   Culture  Setup Time   Final    AEROBIC BOTTLE ONLY GRAM POSITIVE RODS CRITICAL RESULT CALLED TO, READ BACK BY AND VERIFIED WITH: Erling [facility] 05/04/20 2343 JDW    Culture   Final    NO GROWTH 2 DAYS Performed at [facility], [address] [place], [place] [address]    Report Status PENDING  Incomplete         Radiology Studies: No results found.      Scheduled Meds: . amLODipine  5 mg Oral Daily  . enoxaparin (LOVENOX) injection  40 mg Subcutaneous Q24H  . famotidine  20 mg Oral BID  . furosemide  40 mg Intravenous Daily  . insulin aspart  0-15 Units Subcutaneous TID WC  . insulin aspart  0-5 Units Subcutaneous QHS  . labetalol  100 mg Oral BID  . nicotine  21 mg Transdermal Daily  . QUEtiapine  100 mg Oral QHS  . sodium chloride flush  3 mL Intravenous Q12H   Continuous Infusions: . sodium chloride    . cefTRIAXone (ROCEPHIN)  IV 1 g (05/05/20 0504)     LOS: 2 days    Time spent: 35 minutes    Ramiro Harvest, MD [facility]   To contact the attending provider between 7A-7P or the covering provider during after hours 7P-7A, please log into the web site [email or web address] and access using universal [place] password for that web site. If you do not have the password, please call the hospital  operator.  05/05/2020, 6:49 PM

## 2020-05-06 DIAGNOSIS — I5031 Acute diastolic (congestive) heart failure: Secondary | ICD-10-CM | POA: Diagnosis not present

## 2020-05-06 DIAGNOSIS — F1410 Cocaine abuse, uncomplicated: Secondary | ICD-10-CM | POA: Diagnosis not present

## 2020-05-06 DIAGNOSIS — N390 Urinary tract infection, site not specified: Secondary | ICD-10-CM | POA: Diagnosis not present

## 2020-05-06 DIAGNOSIS — Z9119 Patient's noncompliance with other medical treatment and regimen: Secondary | ICD-10-CM

## 2020-05-06 DIAGNOSIS — I509 Heart failure, unspecified: Secondary | ICD-10-CM | POA: Diagnosis not present

## 2020-05-06 LAB — CBC
HCT: 46.7 % — ABNORMAL HIGH (ref 36.0–46.0)
Hemoglobin: 14.5 g/dL (ref 12.0–15.0)
MCH: 27.9 pg (ref 26.0–34.0)
MCHC: 31 g/dL (ref 30.0–36.0)
MCV: 89.8 fL (ref 80.0–100.0)
Platelets: 263 10*3/uL (ref 150–400)
RBC: 5.2 MIL/uL — ABNORMAL HIGH (ref 3.87–5.11)
RDW: 14.7 % (ref 11.5–15.5)
WBC: 5.1 10*3/uL (ref 4.0–10.5)
nRBC: 0 % (ref 0.0–0.2)

## 2020-05-06 LAB — BASIC METABOLIC PANEL
Anion gap: 9 (ref 5–15)
BUN: 15 mg/dL (ref 6–20)
CO2: 30 mmol/L (ref 22–32)
Calcium: 9.2 mg/dL (ref 8.9–10.3)
Chloride: 96 mmol/L — ABNORMAL LOW (ref 98–111)
Creatinine, Ser: 0.62 mg/dL (ref 0.44–1.00)
GFR, Estimated: >60 mL/min (ref >60)
Glucose, Bld: 159 mg/dL — ABNORMAL HIGH (ref 70–99)
Potassium: 4.3 mmol/L (ref 3.5–5.1)
Sodium: 135 mmol/L (ref 135–145)

## 2020-05-06 LAB — GLUCOSE, CAPILLARY
Glucose-Capillary: 148 mg/dL — ABNORMAL HIGH (ref 70–99)
Glucose-Capillary: 208 mg/dL — ABNORMAL HIGH (ref 70–99)

## 2020-05-06 LAB — MAGNESIUM: Magnesium: 1.9 mg/dL (ref 1.7–2.4)

## 2020-05-06 MED ORDER — FAMOTIDINE 20 MG PO TABS: 20.0000 mg | ORAL_TABLET | Freq: Two times a day (BID) | ORAL | 0 refills | Status: AC

## 2020-05-06 MED ORDER — FUROSEMIDE 40 MG PO TABS: 40.0000 mg | ORAL_TABLET | Freq: Every day | ORAL | 0 refills | Status: AC

## 2020-05-06 MED ORDER — AMLODIPINE BESYLATE 5 MG PO TABS: 5.0000 mg | ORAL_TABLET | Freq: Every day | ORAL | 0 refills | Status: AC

## 2020-05-06 MED ORDER — NICOTINE 21 MG/24HR TD PT24: 21.0000 mg | MEDICATED_PATCH | Freq: Every day | TRANSDERMAL | 0 refills | Status: AC

## 2020-05-06 MED ORDER — GUAIFENESIN-DM 100-10 MG/5ML PO SYRP: 10.0000 mL | ORAL_SOLUTION | Freq: Three times a day (TID) | ORAL | 0 refills | Status: AC | PRN

## 2020-05-06 MED ORDER — LABETALOL HCL 100 MG PO TABS: 100.0000 mg | ORAL_TABLET | Freq: Two times a day (BID) | ORAL | 0 refills | Status: AC

## 2020-05-06 NOTE — TOC Transition Note (Signed)
Transition of Care [facility]) - CM/SW Discharge Note   Patient Details  Name: [name] MRN: [id number] Date of Birth: [birth year]  Transition [facility] CM/SW Contact:  Lanier Clam, RN Phone Number: 05/06/2020, 11:09 AM   Clinical Narrative: d/c to [facility]-spoke to patient/[facility] rep Chales [facility]rahams Placey-they will provide intake(if not already done, & services asst for shelter) will email d/c summary to ACT TEAM-they will follow for meds.'shelter list to be given to patient.Nsg to provide transport by safe ride to  [facility]-must be to [facility] by 2p.No further CM needs.    Final next level of care: Homeless Shelter Barriers to Discharge: No Barriers Identified   Patient Goals and CMS Choice Patient states their goals for this hospitalization and ongoing recovery are:: go home CMS Medicare.gov Compare Post Acute Care list provided to:: Patient    Discharge Placement                       Discharge Plan and Services   Discharge Planning Services: CM Consult                                 Social Determinants of Health (SDOH) Interventions     Readmission Risk Interventions No flowsheet data found.

## 2020-05-06 NOTE — Plan of Care (Signed)
  Problem: Health Behavior/Discharge Planning: Goal: Ability to manage health-related needs will improve Outcome: Completed/Met   Problem: Clinical Measurements: Goal: Ability to maintain clinical measurements within normal limits will improve Outcome: Completed/Met Goal: Will remain free from infection Outcome: Completed/Met Goal: Diagnostic test results will improve Outcome: Completed/Met Goal: Respiratory complications will improve Outcome: Completed/Met Goal: Cardiovascular complication will be avoided Outcome: Completed/Met   Problem: Nutrition: Goal: Adequate nutrition will be maintained Outcome: Completed/Met   Problem: Coping: Goal: Level of anxiety will decrease Outcome: Completed/Met   Problem: Elimination: Goal: Will not experience complications related to bowel motility Outcome: Completed/Met Goal: Will not experience complications related to urinary retention Outcome: Completed/Met   Problem: Safety: Goal: Ability to remain free from injury will improve Outcome: Completed/Met   Problem: Skin Integrity: Goal: Risk for impaired skin integrity will decrease Outcome: Completed/Met   Problem: Education: Goal: Ability to demonstrate management of disease process will improve Outcome: Completed/Met Goal: Ability to verbalize understanding of medication therapies will improve Outcome: Completed/Met Goal: Individualized Educational Video(s) Outcome: Completed/Met   Problem: Activity: Goal: Capacity to carry out activities will improve Outcome: Completed/Met   Problem: Cardiac: Goal: Ability to achieve and maintain adequate cardiopulmonary perfusion will improve Outcome: Completed/Met

## 2020-05-06 NOTE — Discharge Summary (Signed)
Physician Discharge Summary  [name] HQI:[id number] DOB: 03/27/1960 DOA: 05/02/2020  PCP: [facility]  Admit date: 05/02/2020 Discharge date: 05/06/2020  Admitted From: Home Disposition: Homeless Shelter  Recommendations for Outpatient Follow-up:  1. Follow up with PCP in 1-2 weeks 2. Follow up with Cardiology in the outpatient setting within 1-2 weeks 3. Please obtain CMP/CBC, Mag, Phos in one week 4. Repeat CXR in 3-6 weeks  5. Please follow up on the following pending results:  Home Health: No Equipment/Devices: None   Discharge Condition: Stable CODE STATUS: FULL CODE  Diet recommendation: Heart Healthy Diet   Brief/Interim Summary: [name] a 60 y.o.femalewith medical history significant ofschizo-affective schizophrenia, BPD, polysubstance abuse, HTN,has history of cocaine abuse, ongoing smoking who presented to the ER with foul-smelling urine and shortness of breath, was diagnosed with UTI along with Acute Hypoxic Respiratory Failure in the setting of Diastolic CHF and admitted to the hospital. She was treated with IV Abx for her UTI and diuresed for her Heart Failure and improved. She was transitioned to po Lasix and weaned off of O2.  She is stable to be discharged only to follow-up with PCP, cardiology and psychiatry in outpatient setting and repeat chest x-ray in 3 weeks  Discharge Diagnoses:  Principal Problem:   New onset of congestive heart failure (HCC) Active Problems:   Polysubstance abuse (HCC)   Non-compliant patient   Hypertension   Cocaine abuse (HCC)   Schizoaffective disorder, bipolar type (HCC)   Acute lower UTI   CHF (congestive heart failure) (HCC)  Acute Hypoxic Respiratory Failure secondary to Acute Diastolic CHF exacerbation Patient presented with worsening shortness of breath.   -Patient admitted with concerns for acute diastolic CHF based on chest x-ray findings, physical exam. -Patient on IV Lasix and is Net  Negative 468 mL.  Current weight of 109 kg (yesterday) from 115.2 kg on admission.   -SpO2: 91 % O2 Flow Rate (L/min): 2 L/min; No longer requiring O2 -2D echo with EF of 65 to 70%, grade 1 diastolic dysfunction, no wall motion abnormalities.   -She showed significant Clinical improvement.   -Will transition to oral Lasix today.  Continue labetalol.  Outpatient follow-up with Cardiology   Suspected UTI -Had symptoms on admission -U/A done and showed body appearance with large leukocytes, rare bacteria 11-20 squamous epithelial cells and greater than 50 WBCs -Urine culture was never actually sent but she completed 3 days of IV ceftriaxone next-she is got clinical improvement -Blood cultures x2 pending and showed no growth to date  Hypertension -Controlled on current regimen of Norvasc, labetalol, IV Lasix while hospitalized. -She is transition to p.o. Lasix today next-continue to monitor blood pressures per protocol -Last blood pressure reading was 162/79  History of Polysubstance Abuse -UDS negative.   -Patient did state only smoking cigarettes and has quit using alcohol or cocaine.  Cessation stressed to patient.  History of Schizoaffective Disorder -Not homicidal or suicidal.  Stable.    Apparently no longer taking Seroquel and now just on Invega which will be continued.   -Outpatient follow-up with psychiatry.    Obesity -Complicates overall prognosis and care -Estimated body mass index is 37.64 kg/m as calculated from the following:   Height as of this encounter: 5\' 7"  (1.702 m).   Weight as of this encounter: 109 kg. -Weight Loss and Dietary Counseling given   Discharge Instructions  Discharge Instructions    Call MD for:  difficulty breathing, headache or visual disturbances   Complete  by: As directed    Call MD for:  extreme fatigue   Complete by: As directed    Call MD for:  hives   Complete by: As directed    Call MD for:  persistant dizziness or  light-headedness   Complete by: As directed    Call MD for:  persistant nausea and vomiting   Complete by: As directed    Call MD for:  redness, tenderness, or signs of infection (pain, swelling, redness, odor or green/yellow discharge around incision site)   Complete by: As directed    Call MD for:  severe uncontrolled pain   Complete by: As directed    Call MD for:  temperature >100.4   Complete by: As directed    Diet - low sodium heart healthy   Complete by: As directed    Discharge instructions   Complete by: As directed    You were cared for by a hospitalist during your hospital stay. If you have any questions about your discharge medications or the care you received while you were in the hospital after you are discharged, you can call the unit and ask to speak with the hospitalist on call if the hospitalist that took care of you is not available. Once you are discharged, your primary care physician will handle any further medical issues. Please note that NO REFILLS for any discharge medications will be authorized once you are discharged, as it is imperative that you return to your primary care physician (or establish a relationship with a primary care physician if you do not have one) for your aftercare needs so that they can reassess your need for medications and monitor your lab values.  Follow up with PCP and Cardiology within 1 week. Take all medications as prescribed. If symptoms change or worsen please return to the ED for evaluation   Increase activity slowly   Complete by: As directed      Allergies as of 05/06/2020      Reactions   Lisinopril Swelling   Angioedema resulting in emergent trach      Medication List    TAKE these medications   amLODipine 5 MG tablet Commonly known as: NORVASC Take 1 tablet (5 mg total) by mouth daily.   famotidine 20 MG tablet Commonly known as: PEPCID Take 1 tablet (20 mg total) by mouth 2 (two) times daily.   furosemide 40 MG  tablet Commonly known as: Lasix Take 1 tablet (40 mg total) by mouth daily.   gabapentin 300 MG capsule Commonly known as: NEURONTIN Take 300 mg by mouth 3 (three) times daily.   guaiFENesin-dextromethorphan 100-10 MG/5ML syrup Commonly known as: ROBITUSSIN DM Take 10 mLs by mouth every 8 (eight) hours as needed for cough.   labetalol 100 MG tablet Commonly known as: NORMODYNE Take 1 tablet (100 mg total) by mouth 2 (two) times daily.   nicotine 21 mg/24hr patch Commonly known as: NICODERM CQ - dosed in mg/24 hours Place 1 patch (21 mg total) onto the skin daily. Start taking on: May 07, 2020   paliperidone 234 MG/1.5ML Susp injection Commonly known as: INVEGA SUSTENNA Inject 234 mg into the muscle every 30 (thirty) days.   paliperidone 3 MG 24 hr tablet Commonly known as: INVEGA Take 3 mg by mouth at bedtime.       Follow-up Information    [employer]. Go to.   Why: go directly at discharge for asst w/shelter. Contact information: [address] [place]  [place] [id number]             Allergies  Allergen Reactions  . Lisinopril Swelling    Angioedema resulting in emergent trach   Consultations:  None  Procedures/Studies: DG Chest 2 View  Result Date: 05/02/2020 CLINICAL DATA:  Tachycardia. EXAM: CHEST - 2 VIEW COMPARISON:  06/29/2019 and prior studies FINDINGS: Cardiomegaly and pulmonary vascular congestion noted. Mild interstitial opacities are present. No pleural effusion or pneumothorax. No acute bony abnormalities are identified. IMPRESSION: Cardiomegaly with pulmonary vascular congestion and possible mild interstitial edema. Electronically Signed   By: Harmon PierJeffrey  Hu M.D.   On: 05/02/2020 13:45   ECHOCARDIOGRAM COMPLETE  Result Date: 05/03/2020    ECHOCARDIOGRAM REPORT   Patient Name:   [name] Date of Exam: 05/03/2020 Medical Rec #:  [id number]    Height:       67.0 in Accession #:    [id number]   Weight:       241.2 lb Date of Birth:   [birth year]    BSA:          2.190 m Patient Age:    59 years     BP:           148/86 mmHg Patient Gender: F            HR:           90 bpm. Exam Location:  Inpatient Procedure: 2D Echo, Color Doppler and Cardiac Doppler Indications:    CHF-Acute Diastolic I50.31  History:        Patient has no prior history of Echocardiogram examinations.                 Risk Factors:Hypertension.  Sonographer:    Eulah PontSarah Pirrotta RDCS Referring Phys: [phone] JARED M GARDNER  Sonographer Comments: Technically difficult study due to poor echo windows. IMPRESSIONS  1. Left ventricular ejection fraction, by estimation, is 65 to 70%. The left ventricle has normal function. The left ventricle has no regional wall motion abnormalities. Left ventricular diastolic parameters are consistent with Grade I diastolic dysfunction (impaired relaxation).  2. Right ventricular systolic function is normal. The right ventricular size is normal. Tricuspid regurgitation signal is inadequate for assessing PA pressure.  3. The mitral valve is normal in structure. Trivial mitral valve regurgitation. No evidence of mitral stenosis.  4. The aortic valve was not well visualized. Aortic valve regurgitation is not visualized. No aortic stenosis is present.  5. The inferior vena cava is normal in size with greater than 50% respiratory variability, suggesting right atrial pressure of 3 mmHg. FINDINGS  Left Ventricle: Left ventricular ejection fraction, by estimation, is 65 to 70%. The left ventricle has normal function. The left ventricle has no regional wall motion abnormalities. The left ventricular internal cavity size was normal in size. There is  no left ventricular hypertrophy. Left ventricular diastolic parameters are consistent with Grade I diastolic dysfunction (impaired relaxation). Right Ventricle: The right ventricular size is normal. No increase in right ventricular wall thickness. Right ventricular systolic function is normal. Tricuspid regurgitation  signal is inadequate for assessing PA pressure. Left Atrium: Left atrial size was normal in size. Right Atrium: Right atrial size was normal in size. Pericardium: There is no evidence of pericardial effusion. Mitral Valve: The mitral valve is normal in structure. Trivial mitral valve regurgitation. No evidence of mitral valve stenosis. Tricuspid Valve: The tricuspid valve is normal in structure. Tricuspid valve regurgitation is trivial. No evidence of tricuspid stenosis. Aortic Valve: The  aortic valve was not well visualized. Aortic valve regurgitation is not visualized. No aortic stenosis is present. Pulmonic Valve: The pulmonic valve was not well visualized. Pulmonic valve regurgitation is not visualized. No evidence of pulmonic stenosis. Aorta: The aortic root is normal in size and structure. Venous: The inferior vena cava is normal in size with greater than 50% respiratory variability, suggesting right atrial pressure of 3 mmHg. IAS/Shunts: No atrial level shunt detected by color flow Doppler.  LEFT VENTRICLE PLAX 2D LVIDd:         5.09 cm  Diastology LVIDs:         3.46 cm  LV e' medial:    5.10 cm/s LV PW:         1.06 cm  LV E/e' medial:  11.6 LV IVS:        0.93 cm  LV e' lateral:   5.48 cm/s LVOT diam:     1.70 cm  LV E/e' lateral: 10.8 LV SV:         69 LV SV Index:   32 LVOT Area:     2.27 cm  RIGHT VENTRICLE RV S prime:     10.80 cm/s TAPSE (M-mode): 1.9 cm LEFT ATRIUM         Index LA diam:    4.40 cm 2.01 cm/m  AORTIC VALVE LVOT Vmax:   168.00 cm/s LVOT Vmean:  125.000 cm/s LVOT VTI:    0.305 m  AORTA Ao Root diam: 3.00 cm Ao Asc diam:  2.90 cm MITRAL VALVE MV Area (PHT): 2.20 cm    SHUNTS MV Decel Time: 345 msec    Systemic VTI:  0.30 m MV E velocity: 59.10 cm/s  Systemic Diam: 1.70 cm MV A velocity: 68.10 cm/s MV E/A ratio:  0.87 Weston Brass MD Electronically signed by Weston Brass MD Signature Date/Time: 05/03/2020/2:43:15 PM    Final     Subjective: Seen and examined at bedside and was  doing fairly well.  Off of supplemental oxygen and wanted to go.  No nausea or vomiting.  Denies any lightheadedness or dizziness.  No other concerns or complaints at this time.   Discharge Exam: Vitals:   05/05/20 2048 05/06/20 0604  BP: (!) 143/95 (!) 162/79  Pulse: 92 72  Resp: 20 (!) 22  Temp: 98.8 F (37.1 C) 97.9 F (36.6 C)  SpO2: 97% 91%   Vitals:   05/05/20 1327 05/05/20 2048 05/05/20 2048 05/06/20 0604  BP: 115/71 (!) 143/95 (!) 143/95 (!) 162/79  Pulse: (!) 102 92 92 72  Resp: 20  20 (!) 22  Temp: 98.6 F (37 C)  98.8 F (37.1 C) 97.9 F (36.6 C)  TempSrc: Oral  Oral Oral  SpO2: 93%  97% 91%  Weight:      Height:        General: Pt is alert, awake, not in acute distress Cardiovascular: RRR, S1/S2 +, no rubs, no gallops Respiratory: CTA bilaterally, no wheezing, no rhonchi; Unlabored breathing  Abdominal: Soft, NT, Distended 2/2 to body habiuts, bowel sounds + Extremities: Trace to mild edema, no cyanosis  The results of significant diagnostics from this hospitalization (including imaging, microbiology, ancillary and laboratory) are listed below for reference.    Microbiology: Recent Results (from the past 240 hour(s))  Resp Panel by RT-PCR (Flu A&B, Covid) Nasopharyngeal Swab     Status: None   Collection Time: 05/03/20  2:20 AM   Specimen: Nasopharyngeal Swab; Nasopharyngeal(NP) swabs in vial transport medium  Result Value  Ref Range Status   SARS Coronavirus 2 by RT PCR NEGATIVE NEGATIVE Final    Comment: (NOTE) SARS-CoV-2 target nucleic acids are NOT DETECTED.  The SARS-CoV-2 RNA is generally detectable in upper respiratory specimens during the acute phase of infection. The lowest concentration of SARS-CoV-2 viral copies this assay can detect is 138 copies/mL. A negative result does not preclude SARS-Cov-2 infection and should not be used as the sole basis for treatment or other patient management decisions. A negative result may occur with  improper  specimen collection/handling, submission of specimen other than nasopharyngeal swab, presence of viral mutation(s) within the areas targeted by this assay, and inadequate number of viral copies(<138 copies/mL). A negative result must be combined with clinical observations, patient history, and epidemiological information. The expected result is Negative.  Fact Sheet for Patients:  BloggerCourse.com  Fact Sheet for Healthcare Providers:  SeriousBroker.it  This test is no t yet approved or cleared by the [place] FDA and  has been authorized for detection and/or diagnosis of SARS-CoV-2 by FDA under an Emergency Use Authorization (EUA). This EUA will remain  in effect (meaning this test can be used) for the duration of the COVID-19 declaration under Section 564(b)(1) of the Act, 21 U.S.C.section 360bbb-3(b)(1), unless the authorization is terminated  or revoked sooner.       Influenza A by PCR NEGATIVE NEGATIVE Final   Influenza B by PCR NEGATIVE NEGATIVE Final    Comment: (NOTE) The Xpert Xpress SARS-CoV-2/FLU/RSV plus assay is intended as an aid in the diagnosis of influenza from Nasopharyngeal swab specimens and should not be used as a sole basis for treatment. Nasal washings and aspirates are unacceptable for Xpert Xpress SARS-CoV-2/FLU/RSV testing.  Fact Sheet for Patients: BloggerCourse.com  Fact Sheet for Healthcare Providers: SeriousBroker.it  This test is not yet approved or cleared by the [place] FDA and has been authorized for detection and/or diagnosis of SARS-CoV-2 by FDA under an Emergency Use Authorization (EUA). This EUA will remain in effect (meaning this test can be used) for the duration of the COVID-19 declaration under Section 564(b)(1) of the Act, 21 U.S.C. section 360bbb-3(b)(1), unless the authorization is terminated or revoked.  Performed at  [facility], [address] [place], [place] [address]   Blood culture (routine x 2)     Status: None (Preliminary result)   Collection Time: 05/03/20  2:20 AM   Specimen: BLOOD  Result Value Ref Range Status   Specimen Description BLOOD RIGHT ARM  Final   Special Requests   Final    BOTTLES DRAWN AEROBIC AND ANAEROBIC Blood Culture adequate volume   Culture   Final    NO GROWTH 3 DAYS Performed at [facility], [address], [place], [place] [address]    Report Status PENDING  Incomplete  Blood culture (routine x 2)     Status: None (Preliminary result)   Collection Time: 05/03/20  2:20 AM   Specimen: BLOOD  Result Value Ref Range Status   Specimen Description BLOOD LEFT ARM  Final   Special Requests   Final    BOTTLES DRAWN AEROBIC AND ANAEROBIC Blood Culture adequate volume   Culture  Setup Time   Final    AEROBIC BOTTLE ONLY GRAM POSITIVE RODS CRITICAL RESULT CALLED TO, READ BACK BY AND VERIFIED WITH: Erling Cruz [facility] 05/04/20 2343 JDW    Culture   Final    CULTURE REINCUBATED FOR BETTER GROWTH Performed at [facility], [address] [place], [place] [address]    Report Status PENDING  Incomplete    Labs: BNP (last 3 results) Recent Labs    05/03/20 0220 05/04/20 0514 05/05/20 0533  BNP 57.7 44.6 45.3   Basic Metabolic Panel: Recent Labs  Lab 05/02/20 1343 05/03/20 0723 05/04/20 0514 05/05/20 0533 05/06/20 0519  NA 140  --  140 138 135  K 4.1  --  4.3 4.4 4.3  CL 98  --  98 97* 96*  CO2 31  --  32 29 30  GLUCOSE 149*  --  174* 177* 159*  BUN 5*  --  CREATININE 0.73  --  0.75 0.63 0.62  CALCIUM 9.3  --  9.0 9.1 9.2  MG  --  1.8 2.0 1.9 1.9   Liver Function Tests: Recent Labs  Lab 05/04/20 0514  AST 16  ALT 20  ALKPHOS 55  BILITOT 0.7  PROT 6.9  ALBUMIN 3.3*   No results for input(s): LIPASE, AMYLASE in the last 168 hours. No results for input(s): AMMONIA in the last 168 hours. CBC: Recent Labs  Lab  05/02/20 1343 05/04/20 0514 05/06/20 0519  WBC 5.9 4.9 5.1  NEUTROABS  --  2.8  --   HGB 14.2 14.2 14.5  HCT 46.4* 46.2* 46.7*  MCV 90.1 90.9 89.8  PLT 311 276 263   Cardiac Enzymes: No results for input(s): CKTOTAL, CKMB, CKMBINDEX, TROPONINI in the last 168 hours. BNP: Invalid input(s): POCBNP CBG: Recent Labs  Lab 05/05/20 1135 05/05/20 1701 05/05/20 2017 05/06/20 0727 05/06/20 1143  GLUCAP 217* 185* 160* 148* 208*   D-Dimer Recent Labs    05/04/20 0514  DDIMER 0.49   Hgb A1c No results for input(s): HGBA1C in the last 72 hours. Lipid Profile No results for input(s): CHOL, HDL, LDLCALC, TRIG, CHOLHDL, LDLDIRECT in the last 72 hours. Thyroid function studies No results for input(s): TSH, T4TOTAL, T3FREE, THYROIDAB in the last 72 hours.  Invalid input(s): FREET3 Anemia work up No results for input(s): VITAMINB12, FOLATE, FERRITIN, TIBC, IRON, RETICCTPCT in the last 72 hours. Urinalysis    Component Value Date/Time   COLORURINE YELLOW 05/02/2020 1433   APPEARANCEUR CLOUDY (A) 05/02/2020 1433   LABSPEC 1.019 05/02/2020 1433   PHURINE 6.0 05/02/2020 1433   GLUCOSEU NEGATIVE 05/02/2020 1433   HGBUR NEGATIVE 05/02/2020 1433   BILIRUBINUR NEGATIVE 05/02/2020 1433   KETONESUR NEGATIVE 05/02/2020 1433   PROTEINUR NEGATIVE 05/02/2020 1433   UROBILINOGEN 0.2 12/08/2014 1937   NITRITE NEGATIVE 05/02/2020 1433   LEUKOCYTESUR LARGE (A) 05/02/2020 1433   Sepsis Labs Invalid input(s): PROCALCITONIN,  WBC,  LACTICIDVEN Microbiology Recent Results (from the past 240 hour(s))  Resp Panel by RT-PCR (Flu A&B, Covid) Nasopharyngeal Swab     Status: None   Collection Time: 05/03/20  2:20 AM   Specimen: Nasopharyngeal Swab; Nasopharyngeal(NP) swabs in vial transport medium  Result Value Ref Range Status   SARS Coronavirus 2 by RT PCR NEGATIVE NEGATIVE Final    Comment: (NOTE) SARS-CoV-2 target nucleic acids are NOT DETECTED.  The SARS-CoV-2 RNA is generally detectable  in upper respiratory specimens during the acute phase of infection. The lowest concentration of SARS-CoV-2 viral copies this assay can detect is 138 copies/mL. A negative result does not preclude SARS-Cov-2 infection and should not be used as the sole basis for treatment or other patient management decisions. A negative result may occur with  improper specimen collection/handling, submission of specimen other than nasopharyngeal swab, presence of viral  mutation(s) within the areas targeted by this assay, and inadequate number of viral copies(<138 copies/mL). A negative result must be combined with clinical observations, patient history, and epidemiological information. The expected result is Negative.  Fact Sheet for Patients:  BloggerCourse.com  Fact Sheet for Healthcare Providers:  SeriousBroker.it  This test is no t yet approved or cleared by the [place] FDA and  has been authorized for detection and/or diagnosis of SARS-CoV-2 by FDA under an Emergency Use Authorization (EUA). This EUA will remain  in effect (meaning this test can be used) for the duration of the COVID-19 declaration under Section 564(b)(1) of the Act, 21 U.S.C.section 360bbb-3(b)(1), unless the authorization is terminated  or revoked sooner.       Influenza A by PCR NEGATIVE NEGATIVE Final   Influenza B by PCR NEGATIVE NEGATIVE Final    Comment: (NOTE) The Xpert Xpress SARS-CoV-2/FLU/RSV plus assay is intended as an aid in the diagnosis of influenza from Nasopharyngeal swab specimens and should not be used as a sole basis for treatment. Nasal washings and aspirates are unacceptable for Xpert Xpress SARS-CoV-2/FLU/RSV testing.  Fact Sheet for Patients: BloggerCourse.com  Fact Sheet for Healthcare Providers: SeriousBroker.it  This test is not yet approved or cleared by the [place] FDA and has  been authorized for detection and/or diagnosis of SARS-CoV-2 by FDA under an Emergency Use Authorization (EUA). This EUA will remain in effect (meaning this test can be used) for the duration of the COVID-19 declaration under Section 564(b)(1) of the Act, 21 U.S.C. section 360bbb-3(b)(1), unless the authorization is terminated or revoked.  Performed at [facility], [address] [place], [place] [address]   Blood culture (routine x 2)     Status: None (Preliminary result)   Collection Time: 05/03/20  2:20 AM   Specimen: BLOOD  Result Value Ref Range Status   Specimen Description BLOOD RIGHT ARM  Final   Special Requests   Final    BOTTLES DRAWN AEROBIC AND ANAEROBIC Blood Culture adequate volume   Culture   Final    NO GROWTH 3 DAYS Performed at [facility], [address], [place], [place] [address]    Report Status PENDING  Incomplete  Blood culture (routine x 2)     Status: None (Preliminary result)   Collection Time: 05/03/20  2:20 AM   Specimen: BLOOD  Result Value Ref Range Status   Specimen Description BLOOD LEFT ARM  Final   Special Requests   Final    BOTTLES DRAWN AEROBIC AND ANAEROBIC Blood Culture adequate volume   Culture  Setup Time   Final    AEROBIC BOTTLE ONLY GRAM POSITIVE RODS CRITICAL RESULT CALLED TO, READ BACK BY AND VERIFIED WITH: Erling Cruz [facility] 05/04/20 2343 JDW    Culture   Final    CULTURE REINCUBATED FOR BETTER GROWTH Performed at [facility] [place] [facility], [address] [place], [place] [address]    Report Status PENDING  Incomplete    Time coordinating discharge: 35 minutes  SIGNED:  Merlene Laughter, DO [facility] 05/06/2020, 11:59 AM Pager is on AMION  If 7PM-7AM, please contact night-coverage [email or web address]

## 2020-05-06 NOTE — Progress Notes (Signed)
Pt discharged to [facility] today per Dr. Marland Mcalpine. Pt's IV site D/C'd and WDL. Pt's VSS. Pt provided with home medication list, discharge instructions and prescriptions. Verbalized understanding. Pt left floor via WC in stable condition accompanied by NT.

## 2020-05-07 LAB — CULTURE, BLOOD (ROUTINE X 2): Special Requests: ADEQUATE

## 2020-05-08 LAB — CULTURE, BLOOD (ROUTINE X 2)
Culture: NO GROWTH
Special Requests: ADEQUATE

## 2021-04-14 DEATH — deceased
